# Patient Record
Sex: Female | Born: 1980 | Race: Black or African American | Hispanic: No | Marital: Single | Smoking: Never smoker
Health system: Southern US, Community
[De-identification: ages and names within clinical notes are randomized; demographics above are authoritative.]

## PROBLEM LIST (undated history)

## (undated) DIAGNOSIS — R109 Unspecified abdominal pain: Secondary | ICD-10-CM

## (undated) DIAGNOSIS — M549 Dorsalgia, unspecified: Secondary | ICD-10-CM

## (undated) DIAGNOSIS — E282 Polycystic ovarian syndrome: Secondary | ICD-10-CM

## (undated) DIAGNOSIS — K859 Acute pancreatitis without necrosis or infection, unspecified: Secondary | ICD-10-CM

## (undated) DIAGNOSIS — H35413 Lattice degeneration of retina, bilateral: Secondary | ICD-10-CM

## (undated) DIAGNOSIS — M2550 Pain in unspecified joint: Secondary | ICD-10-CM

## (undated) DIAGNOSIS — R002 Palpitations: Secondary | ICD-10-CM

## (undated) DIAGNOSIS — K219 Gastro-esophageal reflux disease without esophagitis: Secondary | ICD-10-CM

## (undated) DIAGNOSIS — I10 Essential (primary) hypertension: Secondary | ICD-10-CM

## (undated) DIAGNOSIS — E559 Vitamin D deficiency, unspecified: Secondary | ICD-10-CM

## (undated) DIAGNOSIS — M7661 Achilles tendinitis, right leg: Secondary | ICD-10-CM

## (undated) DIAGNOSIS — R0981 Nasal congestion: Secondary | ICD-10-CM

## (undated) DIAGNOSIS — M7662 Achilles tendinitis, left leg: Secondary | ICD-10-CM

## (undated) DIAGNOSIS — G4730 Sleep apnea, unspecified: Secondary | ICD-10-CM

## (undated) DIAGNOSIS — K5900 Constipation, unspecified: Secondary | ICD-10-CM

## (undated) DIAGNOSIS — R51 Headache: Secondary | ICD-10-CM

## (undated) DIAGNOSIS — J40 Bronchitis, not specified as acute or chronic: Secondary | ICD-10-CM

## (undated) DIAGNOSIS — R079 Chest pain, unspecified: Secondary | ICD-10-CM

## (undated) DIAGNOSIS — K8020 Calculus of gallbladder without cholecystitis without obstruction: Secondary | ICD-10-CM

## (undated) DIAGNOSIS — E119 Type 2 diabetes mellitus without complications: Secondary | ICD-10-CM

## (undated) DIAGNOSIS — E1110 Type 2 diabetes mellitus with ketoacidosis without coma: Secondary | ICD-10-CM

## (undated) DIAGNOSIS — R519 Headache, unspecified: Secondary | ICD-10-CM

## (undated) HISTORY — DX: Pain in unspecified joint: M25.50

## (undated) HISTORY — DX: Type 2 diabetes mellitus without complications: E11.9

## (undated) HISTORY — DX: Gastro-esophageal reflux disease without esophagitis: K21.9

## (undated) HISTORY — DX: Constipation, unspecified: K59.00

## (undated) HISTORY — DX: Chest pain, unspecified: R07.9

## (undated) HISTORY — DX: Calculus of gallbladder without cholecystitis without obstruction: K80.20

## (undated) HISTORY — DX: Headache: R51

## (undated) HISTORY — DX: Acute pancreatitis without necrosis or infection, unspecified: K85.90

## (undated) HISTORY — DX: Palpitations: R00.2

## (undated) HISTORY — DX: Headache, unspecified: R51.9

## (undated) HISTORY — DX: Vitamin D deficiency, unspecified: E55.9

## (undated) HISTORY — DX: Unspecified abdominal pain: R10.9

## (undated) HISTORY — PX: TONSILLECTOMY: SUR1361

## (undated) HISTORY — DX: Dorsalgia, unspecified: M54.9

---

## 1998-01-22 ENCOUNTER — Emergency Department (HOSPITAL_COMMUNITY): Admission: EM | Admit: 1998-01-22 | Discharge: 1998-01-22 | Payer: Self-pay | Admitting: Emergency Medicine

## 1999-08-04 ENCOUNTER — Emergency Department (HOSPITAL_COMMUNITY): Admission: EM | Admit: 1999-08-04 | Discharge: 1999-08-04 | Payer: Self-pay | Admitting: Emergency Medicine

## 2002-01-27 ENCOUNTER — Other Ambulatory Visit: Admission: RE | Admit: 2002-01-27 | Discharge: 2002-01-27 | Payer: Self-pay | Admitting: Obstetrics & Gynecology

## 2003-05-08 ENCOUNTER — Emergency Department (HOSPITAL_COMMUNITY): Admission: EM | Admit: 2003-05-08 | Discharge: 2003-05-08 | Payer: Self-pay | Admitting: Emergency Medicine

## 2003-05-08 ENCOUNTER — Encounter: Payer: Self-pay | Admitting: Emergency Medicine

## 2006-10-23 ENCOUNTER — Emergency Department (HOSPITAL_COMMUNITY): Admission: EM | Admit: 2006-10-23 | Discharge: 2006-10-23 | Payer: Self-pay | Admitting: Family Medicine

## 2012-08-09 DIAGNOSIS — E559 Vitamin D deficiency, unspecified: Secondary | ICD-10-CM | POA: Insufficient documentation

## 2012-08-09 DIAGNOSIS — M545 Low back pain, unspecified: Secondary | ICD-10-CM | POA: Insufficient documentation

## 2013-03-27 ENCOUNTER — Emergency Department (HOSPITAL_BASED_OUTPATIENT_CLINIC_OR_DEPARTMENT_OTHER): Payer: BC Managed Care – PPO

## 2013-03-27 ENCOUNTER — Emergency Department (HOSPITAL_BASED_OUTPATIENT_CLINIC_OR_DEPARTMENT_OTHER)
Admission: EM | Admit: 2013-03-27 | Discharge: 2013-03-27 | Disposition: A | Discharge status: Home / Self Care | Payer: BC Managed Care – PPO | Attending: Emergency Medicine | Admitting: Emergency Medicine

## 2013-03-27 ENCOUNTER — Encounter (HOSPITAL_BASED_OUTPATIENT_CLINIC_OR_DEPARTMENT_OTHER): Payer: Self-pay | Admitting: *Deleted

## 2013-03-27 DIAGNOSIS — I10 Essential (primary) hypertension: Secondary | ICD-10-CM | POA: Insufficient documentation

## 2013-03-27 DIAGNOSIS — Z79899 Other long term (current) drug therapy: Secondary | ICD-10-CM | POA: Insufficient documentation

## 2013-03-27 DIAGNOSIS — H53149 Visual discomfort, unspecified: Secondary | ICD-10-CM | POA: Insufficient documentation

## 2013-03-27 DIAGNOSIS — H538 Other visual disturbances: Secondary | ICD-10-CM | POA: Insufficient documentation

## 2013-03-27 DIAGNOSIS — R51 Headache: Secondary | ICD-10-CM | POA: Insufficient documentation

## 2013-03-27 DIAGNOSIS — R519 Headache, unspecified: Secondary | ICD-10-CM

## 2013-03-27 DIAGNOSIS — R42 Dizziness and giddiness: Secondary | ICD-10-CM | POA: Insufficient documentation

## 2013-03-27 HISTORY — DX: Essential (primary) hypertension: I10

## 2013-03-27 LAB — COMPREHENSIVE METABOLIC PANEL
AST: 21 U/L (ref 0–37)
BUN: 11 mg/dL (ref 6–23)
CO2: 25 mEq/L (ref 19–32)
Calcium: 10 mg/dL (ref 8.4–10.5)
Creatinine, Ser: 0.8 mg/dL (ref 0.50–1.10)
GFR calc non Af Amer: >90 mL/min (ref >90)
Total Bilirubin: 0.3 mg/dL (ref 0.3–1.2)

## 2013-03-27 LAB — CBC WITH DIFFERENTIAL/PLATELET
Basophils Absolute: 0 10*3/uL (ref 0.0–0.1)
Basophils Relative: 0 % (ref 0–1)
Eosinophils Relative: 1 % (ref 0–5)
HCT: 36.5 % (ref 36.0–46.0)
Hemoglobin: 12.1 g/dL (ref 12.0–15.0)
Lymphocytes Relative: 29 % (ref 12–46)
MCHC: 33.2 g/dL (ref 30.0–36.0)
MCV: 85.3 fL (ref 78.0–100.0)
Monocytes Absolute: 0.4 10*3/uL (ref 0.1–1.0)
Monocytes Relative: 5 % (ref 3–12)
RDW: 13.5 % (ref 11.5–15.5)

## 2013-03-27 LAB — TROPONIN I: Troponin I: <0.3 ng/mL (ref <0.30)

## 2013-03-27 MED ORDER — DEXAMETHASONE SODIUM PHOSPHATE 10 MG/ML IJ SOLN
10.0000 mg | Freq: Once | INTRAMUSCULAR | Status: AC
  Administered 2013-03-27: 10 mg via INTRAVENOUS
  Filled 2013-03-27: qty 1

## 2013-03-27 MED ORDER — METOCLOPRAMIDE HCL 5 MG/ML IJ SOLN
10.0000 mg | Freq: Once | INTRAMUSCULAR | Status: AC
  Administered 2013-03-27: 10 mg via INTRAVENOUS
  Filled 2013-03-27: qty 2

## 2013-03-27 MED ORDER — KETOROLAC TROMETHAMINE 30 MG/ML IJ SOLN
30.0000 mg | Freq: Once | INTRAMUSCULAR | Status: AC
  Administered 2013-03-27: 30 mg via INTRAVENOUS
  Filled 2013-03-27: qty 1

## 2013-03-27 MED ORDER — SODIUM CHLORIDE 0.9 % IV BOLUS (SEPSIS)
1000.0000 mL | Freq: Once | INTRAVENOUS | Status: AC
  Administered 2013-03-27: 1000 mL via INTRAVENOUS

## 2013-03-27 NOTE — ED Notes (Signed)
Pt describes head and neck pain as well as blurred vision off and on for a few days. Blurred vision worse past few hours. PERRL. Denies other s/s.

## 2013-03-27 NOTE — ED Provider Notes (Signed)
History     CSN: [id number]  Arrival date & time 03/27/13  1338   First MD Initiated Contact with Patient 03/27/13 1400      Chief Complaint  Patient presents with  . Headache    (Consider location/radiation/quality/duration/timing/severity/associated sxs/prior treatment) HPI Comments: Patient presents with complaints of sharp pain behind the right eye for the past few days, worse the past few hours.  She reports dizziness, blurry vision, but no nausea, stiff neck, or fever.  Patient is a 32 y.o. female presenting with headaches. The history is provided by the patient.  Headache Pain location:  R temporal Quality:  Sharp Radiates to:  Does not radiate Chronicity:  New Context: activity and bright light   Relieved by:  Nothing Worsened by:  Activity and light Ineffective treatments:  None tried Associated symptoms: photophobia   Associated symptoms: no dizziness, no fever, no seizures and no sinus pressure     Past Medical History  Diagnosis Date  . Hypertension     Past Surgical History  Procedure Laterality Date  . Tonsillectomy      History reviewed. No pertinent family history.  History  Substance Use Topics  . Smoking status: Never Smoker   . Smokeless tobacco: Not on file  . Alcohol Use: No    OB History   Grav Para Term Preterm Abortions TAB SAB Ect Mult Living                  Review of Systems  Constitutional: Negative for fever.  HENT: Negative for sinus pressure.   Eyes: Positive for photophobia.  Neurological: Positive for headaches. Negative for dizziness and seizures.  All other systems reviewed and are negative.    Allergies  Review of patient's allergies indicates no known allergies.  Home Medications   Current Outpatient Rx  Name  Route  Sig  Dispense  Refill  . lisinopril-hydrochlorothiazide (PRINZIDE,ZESTORETIC) 20-25 MG per tablet   Oral   Take 1 tablet by mouth daily.           BP 188/109  Pulse 76  Temp(Src) 98.3  F (36.8 C) (Oral)  Resp 20  Ht 5\' 11"  (1.803 m)  Wt 396 lb (179.624 kg)  BMI 55.26 kg/m2  SpO2 99%  LMP 03/26/2013  Physical Exam  Nursing note and vitals reviewed. Constitutional: She is oriented to person, place, and time. She appears well-developed and well-nourished. No distress.  HENT:  Head: Normocephalic and atraumatic.  Eyes: EOM are normal. Pupils are equal, round, and reactive to light.  No papilledema on fundoscopic exam.  Neck: Normal range of motion. Neck supple.  Cardiovascular: Normal rate and regular rhythm.  Exam reveals no gallop and no friction rub.   No murmur heard. Pulmonary/Chest: Effort normal and breath sounds normal. No respiratory distress. She has no wheezes.  Abdominal: Soft. Bowel sounds are normal. She exhibits no distension. There is no tenderness.  Musculoskeletal: Normal range of motion.  Neurological: She is alert and oriented to person, place, and time. No cranial nerve deficit. She exhibits normal muscle tone. Coordination normal.  Skin: Skin is warm and dry. She is not diaphoretic.    ED Course  Procedures (including critical care time)  Labs Reviewed - No data to display No results found.   No diagnosis found.   Date: 03/27/2013  Rate: 78  Rhythm: normal sinus rhythm  QRS Axis: normal  Intervals: PR prolonged  ST/T Wave abnormalities: normal  Conduction Disutrbances:first-degree A-V block   Narrative Interpretation:  Old EKG Reviewed: none available    MDM  The patient is feeling better with meds given.  The ct and labs are all unremarkable.  Her initial elevated blood pressure has since resolved and I personally rechecked it to be 150/80.  She will be discharged, to follow up with pcp in the near future.        Geoffery Lyons, MD 03/27/13 [phone]

## 2013-03-31 ENCOUNTER — Ambulatory Visit (HOSPITAL_BASED_OUTPATIENT_CLINIC_OR_DEPARTMENT_OTHER)
Admit: 2013-03-31 | Discharge: 2013-03-31 | Disposition: A | Discharge status: Home / Self Care | Payer: BC Managed Care – PPO | Attending: Emergency Medicine | Admitting: Emergency Medicine

## 2013-03-31 ENCOUNTER — Encounter (HOSPITAL_BASED_OUTPATIENT_CLINIC_OR_DEPARTMENT_OTHER): Payer: Self-pay | Admitting: *Deleted

## 2013-03-31 ENCOUNTER — Emergency Department (HOSPITAL_BASED_OUTPATIENT_CLINIC_OR_DEPARTMENT_OTHER)
Admission: EM | Admit: 2013-03-31 | Discharge: 2013-03-31 | Disposition: A | Discharge status: Home / Self Care | Payer: BC Managed Care – PPO | Attending: Emergency Medicine | Admitting: Emergency Medicine

## 2013-03-31 ENCOUNTER — Emergency Department (HOSPITAL_BASED_OUTPATIENT_CLINIC_OR_DEPARTMENT_OTHER): Payer: BC Managed Care – PPO

## 2013-03-31 DIAGNOSIS — R1013 Epigastric pain: Secondary | ICD-10-CM | POA: Insufficient documentation

## 2013-03-31 DIAGNOSIS — Z3202 Encounter for pregnancy test, result negative: Secondary | ICD-10-CM | POA: Insufficient documentation

## 2013-03-31 DIAGNOSIS — I10 Essential (primary) hypertension: Secondary | ICD-10-CM | POA: Insufficient documentation

## 2013-03-31 DIAGNOSIS — R7402 Elevation of levels of lactic acid dehydrogenase (LDH): Secondary | ICD-10-CM | POA: Insufficient documentation

## 2013-03-31 DIAGNOSIS — K8050 Calculus of bile duct without cholangitis or cholecystitis without obstruction: Secondary | ICD-10-CM

## 2013-03-31 DIAGNOSIS — K8020 Calculus of gallbladder without cholecystitis without obstruction: Secondary | ICD-10-CM | POA: Insufficient documentation

## 2013-03-31 DIAGNOSIS — N281 Cyst of kidney, acquired: Secondary | ICD-10-CM | POA: Insufficient documentation

## 2013-03-31 DIAGNOSIS — R52 Pain, unspecified: Secondary | ICD-10-CM

## 2013-03-31 DIAGNOSIS — Z79899 Other long term (current) drug therapy: Secondary | ICD-10-CM | POA: Insufficient documentation

## 2013-03-31 DIAGNOSIS — R142 Eructation: Secondary | ICD-10-CM | POA: Insufficient documentation

## 2013-03-31 DIAGNOSIS — R141 Gas pain: Secondary | ICD-10-CM | POA: Insufficient documentation

## 2013-03-31 DIAGNOSIS — R7401 Elevation of levels of liver transaminase levels: Secondary | ICD-10-CM | POA: Insufficient documentation

## 2013-03-31 LAB — CBC WITH DIFFERENTIAL/PLATELET
Basophils Absolute: 0 10*3/uL (ref 0.0–0.1)
Eosinophils Relative: 1 % (ref 0–5)
Lymphocytes Relative: 23 % (ref 12–46)
Lymphs Abs: 1.7 10*3/uL (ref 0.7–4.0)
MCV: 84.9 fL (ref 78.0–100.0)
Neutro Abs: 5.2 10*3/uL (ref 1.7–7.7)
Neutrophils Relative %: 70 % (ref 43–77)
Platelets: 293 10*3/uL (ref 150–400)
RBC: 4.64 MIL/uL (ref 3.87–5.11)
RDW: 13.7 % (ref 11.5–15.5)
WBC: 7.5 10*3/uL (ref 4.0–10.5)

## 2013-03-31 LAB — COMPREHENSIVE METABOLIC PANEL
ALT: 651 U/L — ABNORMAL HIGH (ref 0–35)
AST: 462 U/L — ABNORMAL HIGH (ref 0–37)
Alkaline Phosphatase: 153 U/L — ABNORMAL HIGH (ref 39–117)
CO2: 25 mEq/L (ref 19–32)
Calcium: 9.7 mg/dL (ref 8.4–10.5)
Chloride: 99 mEq/L (ref 96–112)
GFR calc Af Amer: >90 mL/min (ref >90)
GFR calc non Af Amer: 83 mL/min — ABNORMAL LOW (ref >90)
Glucose, Bld: 147 mg/dL — ABNORMAL HIGH (ref 70–99)
Potassium: 4.6 mEq/L (ref 3.5–5.1)
Sodium: 135 mEq/L (ref 135–145)

## 2013-03-31 LAB — URINALYSIS, ROUTINE W REFLEX MICROSCOPIC
Glucose, UA: NEGATIVE mg/dL
Ketones, ur: NEGATIVE mg/dL
Leukocytes, UA: NEGATIVE
Nitrite: NEGATIVE
Specific Gravity, Urine: 1.013 (ref 1.005–1.030)
pH: 6 (ref 5.0–8.0)

## 2013-03-31 LAB — LIPASE, BLOOD: Lipase: 85 U/L — ABNORMAL HIGH (ref 11–59)

## 2013-03-31 LAB — URINE MICROSCOPIC-ADD ON

## 2013-03-31 MED ORDER — IOHEXOL 300 MG/ML  SOLN
100.0000 mL | Freq: Once | INTRAMUSCULAR | Status: AC | PRN
  Administered 2013-03-31: 100 mL via INTRAVENOUS

## 2013-03-31 MED ORDER — KETOROLAC TROMETHAMINE 30 MG/ML IJ SOLN
30.0000 mg | Freq: Once | INTRAMUSCULAR | Status: AC
  Administered 2013-03-31: 30 mg via INTRAVENOUS
  Filled 2013-03-31: qty 1

## 2013-03-31 MED ORDER — SODIUM CHLORIDE 0.9 % IV BOLUS (SEPSIS)
1000.0000 mL | Freq: Once | INTRAVENOUS | Status: AC
  Administered 2013-03-31: 1000 mL via INTRAVENOUS

## 2013-03-31 MED ORDER — OXYCODONE-ACETAMINOPHEN 5-325 MG PO TABS: 1.0000 | ORAL_TABLET | Freq: Four times a day (QID) | ORAL | Status: DC | PRN

## 2013-03-31 MED ORDER — IOHEXOL 300 MG/ML  SOLN
50.0000 mL | Freq: Once | INTRAMUSCULAR | Status: AC | PRN
  Administered 2013-03-31: 50 mL via ORAL

## 2013-03-31 MED ORDER — ONDANSETRON 8 MG PO TBDP: ORAL_TABLET | ORAL | Status: DC

## 2013-03-31 MED ORDER — GI COCKTAIL ~~LOC~~
30.0000 mL | Freq: Once | ORAL | Status: AC
  Administered 2013-03-31: 30 mL via ORAL
  Filled 2013-03-31: qty 30

## 2013-03-31 NOTE — ED Provider Notes (Addendum)
History     CSN: [id number]  Arrival date & time 03/31/13  0220   First MD Initiated Contact with Patient 03/31/13 [phone]      Chief Complaint  Patient presents with  . Chest Pain    (Consider location/radiation/quality/duration/timing/severity/associated sxs/prior treatment) Patient is a 32 y.o. female presenting with abdominal pain. The history is provided by the patient. No language interpreter was used.  Abdominal Pain This is a new problem. The current episode started 12 to 24 hours ago. The problem occurs constantly. The problem has been gradually worsening. Associated symptoms include abdominal pain. Pertinent negatives include no headaches and no shortness of breath. Nothing aggravates the symptoms. Nothing relieves the symptoms. She has tried nothing for the symptoms. The treatment provided no relief.    Past Medical History  Diagnosis Date  . Hypertension     Past Surgical History  Procedure Laterality Date  . Tonsillectomy      No family history on file.  History  Substance Use Topics  . Smoking status: Never Smoker   . Smokeless tobacco: Not on file  . Alcohol Use: No    OB History   Grav Para Term Preterm Abortions TAB SAB Ect Mult Living                  Review of Systems  Respiratory: Negative for shortness of breath.   Gastrointestinal: Positive for abdominal pain.  Neurological: Negative for headaches.  All other systems reviewed and are negative.    Allergies  Review of patient's allergies indicates no known allergies.  Home Medications   Current Outpatient Rx  Name  Route  Sig  Dispense  Refill  . lisinopril-hydrochlorothiazide (PRINZIDE,ZESTORETIC) 20-25 MG per tablet   Oral   Take 1 tablet by mouth daily.           BP 158/92  Pulse 80  Temp(Src) 98.3 F (36.8 C) (Oral)  Resp 20  Ht 5\' 11"  (1.803 m)  Wt 396 lb (179.624 kg)  BMI 55.26 kg/m2  SpO2 100%  LMP 03/26/2013  Physical Exam  Constitutional: She is oriented to  person, place, and time. She appears well-developed and well-nourished. No distress.  HENT:  Head: Normocephalic and atraumatic.  Mouth/Throat: Oropharynx is clear and moist.  Eyes: Conjunctivae are normal. Pupils are equal, round, and reactive to light.  Neck: Normal range of motion. Neck supple.  Cardiovascular: Normal rate, regular rhythm and intact distal pulses.   Pulmonary/Chest: Effort normal and breath sounds normal. She has no wheezes. She has no rales.  Abdominal: Soft. Bowel sounds are normal. She exhibits no distension and no mass. There is tenderness in the epigastric area. There is no rebound and no guarding.  Musculoskeletal: Normal range of motion.  Neurological: She is alert and oriented to person, place, and time.  Skin: Skin is warm and dry.  Psychiatric: She has a normal mood and affect.    ED Course  Procedures (including critical care time)  Labs Reviewed  COMPREHENSIVE METABOLIC PANEL - Abnormal; Notable for the following:    Glucose, Bld 147 (*)    AST 462 (*)    ALT 651 (*)    Alkaline Phosphatase 153 (*)    Total Bilirubin 3.1 (*)    GFR calc non Af Amer 83 (*)    All other components within normal limits  LIPASE, BLOOD - Abnormal; Notable for the following:    Lipase 85 (*)    All other components within normal limits  URINALYSIS,  ROUTINE W REFLEX MICROSCOPIC - Abnormal; Notable for the following:    Color, Urine AMBER (*)    Hgb urine dipstick MODERATE (*)    Bilirubin Urine SMALL (*)    All other components within normal limits  CBC WITH DIFFERENTIAL  TROPONIN I  PREGNANCY, URINE  URINE MICROSCOPIC-ADD ON   Dg Chest 2 View  03/31/2013   *RADIOLOGY REPORT*  Clinical Data: Chest pain.  Difficulty breathing.  CHEST - 2 VIEW  Comparison: No priors.  Findings: Lung volumes are normal.  No consolidative airspace disease.  No pleural effusions.  No pneumothorax.  No pulmonary nodule or mass noted.  Pulmonary vasculature and the cardiomediastinal  silhouette are within normal limits.  IMPRESSION: 1. No radiographic evidence of acute cardiopulmonary disease.   Original Report Authenticated By: Trudie Reed, M.D.     No diagnosis found.    MDM   Date: 03/31/2013  Rate: 74  Rhythm: normal sinus rhythm  QRS Axis: normal  Intervals: PR prolonged  ST/T Wave abnormalities: nonspecific ST changes  Conduction Disutrbances:first-degree A-V block   Narrative Interpretation:   Old EKG Reviewed: unchanged    MDM Reviewed: previous chart, nursing note and vitals Reviewed previous: labs and ECG Interpretation: labs, ECG, x-ray and CT scan     Marked change in LFTs, went from normal on 6/15 to being elevated with slight elevation of lipase suspect biliary colic  CT normal, will call surgery regarding close outpatient follow up  Will prescribe pain medication anti emetics and bland diet.  Will need labs rechecked in 3 days   Case d/w Dr. Luisa Hart will need [place] and hepatitis work up and GI follow up.  Instructions to PMD reflect need for repeat LFTS and hepatitis work up.  Patient verbalizes understanding and agrees to follow up  Randal Goens Smitty Cords, MD 03/31/13 [phone]

## 2013-03-31 NOTE — ED Notes (Signed)
Pt sts she was constipated two days ago and took a laxative. She sts the laxative was effective but afterwards she noticed a sharp pain in her lower sternum. Pt sts the pain resolved but then returned apprx. 2 hours ago. Pt sts the pain is worse with palpation and deep breathing.

## 2013-03-31 NOTE — ED Notes (Signed)
Patient transported to CT 

## 2013-04-13 ENCOUNTER — Ambulatory Visit (INDEPENDENT_AMBULATORY_CARE_PROVIDER_SITE_OTHER): Payer: BC Managed Care – PPO | Admitting: General Surgery

## 2013-04-13 ENCOUNTER — Encounter (INDEPENDENT_AMBULATORY_CARE_PROVIDER_SITE_OTHER): Payer: Self-pay | Admitting: General Surgery

## 2013-04-13 ENCOUNTER — Other Ambulatory Visit (INDEPENDENT_AMBULATORY_CARE_PROVIDER_SITE_OTHER): Payer: Self-pay

## 2013-04-13 VITALS — HR 60 | Resp 18 | Ht 71.0 in | Wt 398.4 lb

## 2013-04-13 DIAGNOSIS — K8020 Calculus of gallbladder without cholecystitis without obstruction: Secondary | ICD-10-CM

## 2013-04-13 DIAGNOSIS — I10 Essential (primary) hypertension: Secondary | ICD-10-CM

## 2013-04-13 NOTE — Progress Notes (Signed)
Patient ID: Angel Dawson, female   DOB: 08-Jul-1981, 32 y.o.   MRN: [id number]  No chief complaint on file.   HPI ANTOINE FIALLOS is a 32 y.o. female.  She is referred by Dr. Terressa Koyanagi at the emergency department for evaluation of biliary tract disease.   She has no prior history of biliary tract disease or liver disease or hepatitis. On June 15 she had an episode of abdominal pain. Liver function tests were normal. The pain came back on June 19 was very severe pain in the epigastrium, took or 20 minutes to walk to  her car. She developed nausea and vomiting the next day and went back to the emergency room. This was at [facility]. Liver function tests showed total bilirubin 3.1, lipase 85, AST 462, ALT 681. CT scan showed a cyst of the left kidney but was otherwise negative. Ultrasound showed gallstones versus sludge in the gallbladder but no acute changes. CBD was 5 mm. She became asymptomatic. About 4 or 5 days ago she had a four-hour episode of right upper quadrant pain that resolved she's been asymptomatic since.  Comorbidities include morbid obesity, hypertension. HPI  Past Medical History  Diagnosis Date  . Hypertension   . GERD (gastroesophageal reflux disease)   . Abdominal pain   . Gallstones     Past Surgical History  Procedure Laterality Date  . Tonsillectomy      History reviewed. No pertinent family history.  Social History History  Substance Use Topics  . Smoking status: Never Smoker   . Smokeless tobacco: Not on file  . Alcohol Use: Yes    No Known Allergies  Current Outpatient Prescriptions  Medication Sig Dispense Refill  . carvedilol (COREG) 12.5 MG tablet Take 12.5 mg by mouth 2 (two) times daily with a meal.      . lisinopril-hydrochlorothiazide (PRINZIDE,ZESTORETIC) 20-25 MG per tablet Take 1 tablet by mouth daily.      . ondansetron (ZOFRAN ODT) 8 MG disintegrating tablet 8mg  ODT q8 hours prn nausea  4 tablet  0  .  oxyCODONE-acetaminophen (PERCOCET) 5-325 MG per tablet Take 1 tablet by mouth every 6 (six) hours as needed for pain.  17 tablet  0   No current facility-administered medications for this visit.    Review of Systems Review of Systems  Constitutional: Negative for fever, chills and unexpected weight change.  HENT: Negative for hearing loss, congestion, sore throat, trouble swallowing and voice change.   Eyes: Negative for visual disturbance.  Respiratory: Negative for cough and wheezing.   Cardiovascular: Negative for chest pain, palpitations and leg swelling.  Gastrointestinal: Positive for nausea, vomiting and abdominal pain. Negative for diarrhea, constipation, blood in stool, abdominal distention and anal bleeding.  Genitourinary: Negative for hematuria, vaginal bleeding and difficulty urinating.  Musculoskeletal: Negative for arthralgias.  Skin: Negative for rash and wound.  Neurological: Negative for seizures, syncope and headaches.  Hematological: Negative for adenopathy. Does not bruise/bleed easily.  Psychiatric/Behavioral: Negative for confusion.    Pulse 60, resp. rate 18, height 5\' 11"  (1.803 m), weight 398 lb 6.4 oz (180.713 kg), last menstrual period 03/26/2013.  Physical Exam Physical Exam  Constitutional: She is oriented to person, place, and time. She appears well-developed and well-nourished. No distress.  BMI 55  HENT:  Head: Normocephalic and atraumatic.  Nose: Nose normal.  Mouth/Throat: No oropharyngeal exudate.  Eyes: Conjunctivae and EOM are normal. Pupils are equal, round, and reactive to light. Left eye exhibits no discharge. No  scleral icterus.  Neck: Neck supple. No JVD present. No tracheal deviation present. No thyromegaly present.  Cardiovascular: Normal rate, regular rhythm, normal heart sounds and intact distal pulses.   No murmur heard. Pulmonary/Chest: Effort normal and breath sounds normal. No respiratory distress. She has no wheezes. She has no  rales. She exhibits no tenderness.  Abdominal: Soft. Bowel sounds are normal. She exhibits no distension and no mass. There is no tenderness. There is no rebound and no guarding.  Musculoskeletal: She exhibits no edema and no tenderness.  Lymphadenopathy:    She has no cervical adenopathy.  Neurological: She is alert and oriented to person, place, and time. She exhibits normal muscle tone. Coordination normal.  Skin: Skin is warm. No rash noted. She is not diaphoretic. No erythema. No pallor.  Psychiatric: She has a normal mood and affect. Her behavior is normal. Judgment and thought content normal.    Data Reviewed The emergency department records, imaging studies, lab work.  Assessment    Abdominal pain, abnormal LFTs, and probable gallstones. I suspect that she will need to undergo cholecystectomy  Significant elevation of LFTs, she probably passed a CBD stone. Whether she has a retained stone is not known at this time  Hypertension  Morbid obesity     Plan    I told her that she would ultimately need to have a laparoscopic cholecystectomy with cholangiogram  Prior to the cholecystectomy were going to repeat her LFTs, get an acute hepatitis panel and do an MRCP. If the MRCP is positive for CBD stone we'll consider ERCP preop. If not we will proceed with cholecystectomy and cholangiogram and take it from there.  She will return to see me next week for discussion of her lab and x-ray findings and final planning.  I discussed the indications, details, techniques, and numerous risks of cholecystectomy with her. She understands all these issues and all her questions were answered. She agrees with this plan.          [name]. Derrell Lolling, M.D., [facility] General and Minimally invasive Surgery Breast and Colorectal Surgery Office:   [phone] Pager:   [phone]  04/13/2013, 5:48 PM

## 2013-04-13 NOTE — Patient Instructions (Signed)
I suspect that your episodes of abdominal pain and abnormal liver function tests are due to gallstones. There is a concern about whether you have common bile duct stones that could cause jaundice or pancreatitis in the future.  You will be sent for lab work, hepatitis testing, and a special x-ray: MRCP to evaluate for possible common bile duct stones.  Return to see Dr. Derrell Lolling next week for final discussion. I suspect that you will need to have a gallbladder operation.       Laparoscopic Cholecystectomy Laparoscopic cholecystectomy is surgery to remove the gallbladder. The gallbladder is located slightly to the right of center in the abdomen, behind the liver. It is a concentrating and storage sac for the bile produced in the liver. Bile aids in the digestion and absorption of fats. Gallbladder disease (cholecystitis) is an inflammation of your gallbladder. This condition is usually caused by a buildup of gallstones (cholelithiasis) in your gallbladder. Gallstones can block the flow of bile, resulting in inflammation and pain. In severe cases, emergency surgery may be required. When emergency surgery is not required, you will have time to prepare for the procedure. Laparoscopic surgery is an alternative to open surgery. Laparoscopic surgery usually has a shorter recovery time. Your common bile duct may also need to be examined and explored. Your caregiver will discuss this with you if he or she feels this should be done. If stones are found in the common bile duct, they may be removed. LET YOUR CAREGIVER KNOW ABOUT:  Allergies to food or medicine.  Medicines taken, including vitamins, herbs, eyedrops, over-the-counter medicines, and creams.  Use of steroids (by mouth or creams).  Previous problems with anesthetics or numbing medicines.  History of bleeding problems or blood clots.  Previous surgery.  Other health problems, including diabetes and kidney problems.  Possibility of  pregnancy, if this applies. RISKS AND COMPLICATIONS All surgery is associated with risks. Some problems that may occur following this procedure include:  Infection.  Damage to the common bile duct, nerves, arteries, veins, or other internal organs such as the stomach or intestines.  Bleeding.  A stone may remain in the common bile duct. BEFORE THE PROCEDURE  Do not take aspirin for 3 days prior to surgery or blood thinners for 1 week prior to surgery.  Do not eat or drink anything after midnight the night before surgery.  Let your caregiver know if you develop a cold or other infectious problem prior to surgery.  You should be present 60 minutes before the procedure or as directed. PROCEDURE  You will be given medicine that makes you sleep (general anesthetic). When you are asleep, your surgeon will make several small cuts (incisions) in your abdomen. One of these incisions is used to insert a small, lighted scope (laparoscope) into the abdomen. The laparoscope helps the surgeon see into your abdomen. Carbon dioxide gas will be pumped into your abdomen. The gas allows more room for the surgeon to perform your surgery. Other operating instruments are inserted through the other incisions. Laparoscopic procedures may not be appropriate when:  There is major scarring from previous surgery.  The gallbladder is extremely inflamed.  There are bleeding disorders or unexpected cirrhosis of the liver.  A pregnancy is near term.  Other conditions make the laparoscopic procedure impossible. If your surgeon feels it is not safe to continue with a laparoscopic procedure, he or she will perform an open abdominal procedure. In this case, the surgeon will make an incision to open  the abdomen. This gives the surgeon a larger view and field to work within. This may allow the surgeon to perform procedures that sometimes cannot be performed with a laparoscope alone. Open surgery has a longer recovery  time. AFTER THE PROCEDURE  You will be taken to the recovery area where a nurse will watch and check your progress.  You may be allowed to go home the same day.  Do not resume physical activities until directed by your caregiver.  You may resume a normal diet and activities as directed. Document Released: 09/29/2005 Document Revised: 12/22/2011 Document Reviewed: 03/14/2011 [facility] Patient Information 2014 [place], [place].

## 2013-04-14 ENCOUNTER — Telehealth (INDEPENDENT_AMBULATORY_CARE_PROVIDER_SITE_OTHER): Payer: Self-pay | Admitting: General Surgery

## 2013-04-14 LAB — COMPREHENSIVE METABOLIC PANEL
ALT: 267 U/L — ABNORMAL HIGH (ref 0–35)
AST: 66 U/L — ABNORMAL HIGH (ref 0–37)
Alkaline Phosphatase: 156 U/L — ABNORMAL HIGH (ref 39–117)
BUN: 13 mg/dL (ref 6–23)
Creat: 1.1 mg/dL (ref 0.50–1.10)
Potassium: 3.9 mEq/L (ref 3.5–5.3)

## 2013-04-14 LAB — HEPATITIS PANEL, ACUTE
Hep B C IgM: NEGATIVE
Hepatitis B Surface Ag: NEGATIVE

## 2013-04-14 NOTE — Telephone Encounter (Signed)
Left a message on patient voice mail she has appt at [address] office 04/18/13 at (8:00) pm  NPO after 430pm

## 2013-04-18 ENCOUNTER — Ambulatory Visit
Admission: RE | Admit: 2013-04-18 | Discharge: 2013-04-18 | Disposition: A | Discharge status: Home / Self Care | Payer: BC Managed Care – PPO | Source: Ambulatory Visit | Attending: General Surgery | Admitting: General Surgery

## 2013-04-18 DIAGNOSIS — K8020 Calculus of gallbladder without cholecystitis without obstruction: Secondary | ICD-10-CM

## 2013-04-18 MED ORDER — GADOBENATE DIMEGLUMINE 529 MG/ML IV SOLN
20.0000 mL | Freq: Once | INTRAVENOUS | Status: AC | PRN
  Administered 2013-04-18: 20 mL via INTRAVENOUS

## 2013-04-20 ENCOUNTER — Telehealth (INDEPENDENT_AMBULATORY_CARE_PROVIDER_SITE_OTHER): Payer: Self-pay

## 2013-04-20 NOTE — Telephone Encounter (Signed)
I left a message for the patient with the results.  I offered her an appointment for 05/05/2013 at 0845 to discuss surgery.  I asked her to call me back and confirm.

## 2013-04-20 NOTE — Telephone Encounter (Signed)
Message copied by Ivory Broad on Wed Apr 20, 2013  2:00 PM ------      Message from: Ernestene Mention      Created: Tue Apr 19, 2013 10:42 AM       Please call patient and informed her that the MRCP shows gallstones, but no stones in the main bile duct. We will discuss with her in the office next time, but the next step in her care is probably to proceed with cholecystectomy.            hmi            ----- Message -----         From: Rad Results In Interface         Sent: 04/19/2013   9:36 AM           To: Ernestene Mention, MD                   ------

## 2013-04-21 NOTE — Telephone Encounter (Signed)
I left a message for the pt to call me.

## 2013-04-21 NOTE — Telephone Encounter (Signed)
Made her aware of below message. She wants to delay her appt because she is getting new insurance in August. I made appt for 06/02/2013 but would like sooner if available in August. I made her aware he is not available the first week in August.

## 2013-05-02 ENCOUNTER — Telehealth (INDEPENDENT_AMBULATORY_CARE_PROVIDER_SITE_OTHER): Payer: Self-pay

## 2013-05-02 NOTE — Telephone Encounter (Signed)
Pt calling asking for an appointment sooner than her scheduled Aug 21st appt.  Pt states that she is having more frequent GB attacks.   I did not see anything available before then.  Told pt I would forward Huntley Dec a message to see if she can work pt in. Pt hoping to have Sx sooner rather than later.  Please call back and advise.  [phone]

## 2013-05-05 ENCOUNTER — Encounter (INDEPENDENT_AMBULATORY_CARE_PROVIDER_SITE_OTHER): Payer: BC Managed Care – PPO | Admitting: General Surgery

## 2013-05-20 ENCOUNTER — Telehealth (INDEPENDENT_AMBULATORY_CARE_PROVIDER_SITE_OTHER): Payer: Self-pay

## 2013-05-20 NOTE — Telephone Encounter (Signed)
Patient states she is wanting to be prepared for any gallbladder attacks before she has surgery 06/02/13, she is asking for percocet. I advised her she is not scheduled for surgery on 06/02/13 she is scheduled for consult about having surgery with  Dr. Derrell Lolling on 06/02/13 . If she has G.B attacks she would need to go to the ER , We can not call percocet in . Patient verbalized understanding

## 2013-06-02 ENCOUNTER — Ambulatory Visit (INDEPENDENT_AMBULATORY_CARE_PROVIDER_SITE_OTHER): Payer: BC Managed Care – PPO | Admitting: General Surgery

## 2013-06-02 ENCOUNTER — Encounter (INDEPENDENT_AMBULATORY_CARE_PROVIDER_SITE_OTHER): Payer: Self-pay | Admitting: General Surgery

## 2013-06-02 VITALS — BP 120/90 | HR 78 | Temp 96.4°F | Ht 71.0 in | Wt 396.6 lb

## 2013-06-02 DIAGNOSIS — K8020 Calculus of gallbladder without cholecystitis without obstruction: Secondary | ICD-10-CM

## 2013-06-02 NOTE — Progress Notes (Signed)
Patient ID: Angel Dawson, female   DOB: 1981-09-02, 32 y.o.   MRN: [id number]  Chief Complaint  Patient presents with  . Follow-up    discuss surgery    HPI Angel Dawson is a 32 y.o. female.  She returns for final decision making regarding her biliary tract disease.  Recall that she was referred by Dr. Terressa Koyanagi at the emergency department for evaluation of biliary tract disease.  She has no prior history of biliary tract disease or liver disease or hepatitis. On June 15 she had an episode of abdominal pain. Liver function tests were normal. The pain came back on June 19 was very severe pain in the epigastrium, took or 20 minutes to walk to her car. She developed nausea and vomiting the next day and went back to the emergency room. This was at [facility]. Liver function tests showed total bilirubin 3.1, lipase 85, AST 462, ALT 681. CT scan showed a cyst of the left kidney but was otherwise negative. Ultrasound showed gallstones versus sludge in the gallbladder but no acute changes. CBD was 5 mm. She became asymptomatic. About 4 or 5 days ago she had a four-hour episode of right upper quadrant pain that resolved she's been asymptomatic since.   MRCP shows small gallstones, normal CBD, no evidence of CBD stones. Hepatitis screening was negative. LFTs were improved with total bilirubin 0.5, alkaline phosphatase 156, AST  66 and ALT 267. She has had one more episode of right upper quadrant pain nausea and vomiting which lasted 3 hours since I last saw her. She feels fine today. I discussed all of her tests with her.  Comorbidities include morbid obesity, hypertension.  HPI  Past Medical History  Diagnosis Date  . Hypertension   . GERD (gastroesophageal reflux disease)   . Abdominal pain   . Gallstones     Past Surgical History  Procedure Laterality Date  . Tonsillectomy      History reviewed. No pertinent family history.  Social History History  Substance Use Topics   . Smoking status: Never Smoker   . Smokeless tobacco: Not on file  . Alcohol Use: Yes    No Known Allergies  Current Outpatient Prescriptions  Medication Sig Dispense Refill  . carvedilol (COREG) 12.5 MG tablet Take 12.5 mg by mouth 2 (two) times daily with a meal.      . lisinopril-hydrochlorothiazide (PRINZIDE,ZESTORETIC) 20-25 MG per tablet Take 1 tablet by mouth daily.      . ondansetron (ZOFRAN ODT) 8 MG disintegrating tablet 8mg  ODT q8 hours prn nausea  4 tablet  0  . oxyCODONE-acetaminophen (PERCOCET) 5-325 MG per tablet Take 1 tablet by mouth every 6 (six) hours as needed for pain.  17 tablet  0   No current facility-administered medications for this visit.    Review of Systems Review of Systems  Constitutional: Negative for fever, chills and unexpected weight change.  HENT: Negative for hearing loss, congestion, sore throat, trouble swallowing and voice change.   Eyes: Negative for visual disturbance.  Respiratory: Negative for cough and wheezing.   Cardiovascular: Negative for chest pain, palpitations and leg swelling.  Gastrointestinal: Positive for nausea, vomiting and abdominal pain. Negative for diarrhea, constipation, blood in stool, abdominal distention and anal bleeding.  Genitourinary: Negative for hematuria, vaginal bleeding and difficulty urinating.  Musculoskeletal: Negative for arthralgias.  Skin: Negative for rash and wound.  Neurological: Negative for seizures, syncope and headaches.  Hematological: Negative for adenopathy. Does not bruise/bleed easily.  Psychiatric/Behavioral:  Negative for confusion.    Blood pressure 120/90, pulse 78, temperature 96.4 F (35.8 C), temperature source Temporal, height 5\' 11"  (1.803 m), weight 396 lb 9.6 oz (179.897 kg), SpO2 98.00%.  Physical Exam Physical Exam  Constitutional: She is oriented to person, place, and time. She appears well-developed and well-nourished. No distress.  396 lbs.  BMI 55.  HENT:  Head:  Normocephalic and atraumatic.  Nose: Nose normal.  Mouth/Throat: No oropharyngeal exudate.  Eyes: Conjunctivae and EOM are normal. Pupils are equal, round, and reactive to light. Left eye exhibits no discharge. No scleral icterus.  Neck: Neck supple. No JVD present. No tracheal deviation present. No thyromegaly present.  Cardiovascular: Normal rate, regular rhythm, normal heart sounds and intact distal pulses.   No murmur heard. Pulmonary/Chest: Effort normal and breath sounds normal. No respiratory distress. She has no wheezes. She has no rales. She exhibits no tenderness.  Abdominal: Soft. Bowel sounds are normal. She exhibits no distension and no mass. There is no tenderness. There is no rebound and no guarding.  Musculoskeletal: She exhibits no edema and no tenderness.  Lymphadenopathy:    She has no cervical adenopathy.  Neurological: She is alert and oriented to person, place, and time. She exhibits normal muscle tone. Coordination normal.  Skin: Skin is warm. No rash noted. She is not diaphoretic. No erythema. No pallor.  Psychiatric: She has a normal mood and affect. Her behavior is normal. Judgment and thought content normal.    Data Reviewed MRCP. Hepatitis serology. Chemistries.  Assessment    Chronic cholecystitis with cholelithiasis and recurring episodes of biliary colic.  Transient elevation of LFTs, she probably passed a CBD stone. Retained stone as possible  Hypertension  Morbid obesity     Plan    Proceed with scheduling of laparoscopic cholecystectomy with cholangiogram, possible open. We will do this at [facility] because of the extra long instruments. If everything goes well she can go home with her mother the same day. Otherwise we will keep her overnight.  Once again, I discussed the indications, details, techniques numerous risks of cholecystectomy with her. She understands all these issues and all of her questions were answered and she agrees with this plan.         Angel Dawson. Derrell Lolling, M.D., [facility] General and Minimally invasive Surgery Breast and Colorectal Surgery Office:   [phone] Pager:   [phone]  06/02/2013, 11:16 AM

## 2013-06-02 NOTE — Patient Instructions (Signed)
The MRCP shows gallstones, but the main bile duct looks normal. No evidence of common duct stones.  Your  liver function tests are improved, but not quite normal.  Hepatitis testing is negative.  You will be scheduled for laparoscopic cholecystectomy with cholangiogram, possible open in the near future.    Laparoscopic Cholecystectomy Laparoscopic cholecystectomy is surgery to remove the gallbladder. The gallbladder is located slightly to the right of center in the abdomen, behind the liver. It is a concentrating and storage sac for the bile produced in the liver. Bile aids in the digestion and absorption of fats. Gallbladder disease (cholecystitis) is an inflammation of your gallbladder. This condition is usually caused by a buildup of gallstones (cholelithiasis) in your gallbladder. Gallstones can block the flow of bile, resulting in inflammation and pain. In severe cases, emergency surgery may be required. When emergency surgery is not required, you will have time to prepare for the procedure. Laparoscopic surgery is an alternative to open surgery. Laparoscopic surgery usually has a shorter recovery time. Your common bile duct may also need to be examined and explored. Your caregiver will discuss this with you if he or she feels this should be done. If stones are found in the common bile duct, they may be removed. LET YOUR CAREGIVER KNOW ABOUT:  Allergies to food or medicine.  Medicines taken, including vitamins, herbs, eyedrops, over-the-counter medicines, and creams.  Use of steroids (by mouth or creams).  Previous problems with anesthetics or numbing medicines.  History of bleeding problems or blood clots.  Previous surgery.  Other health problems, including diabetes and kidney problems.  Possibility of pregnancy, if this applies. RISKS AND COMPLICATIONS All surgery is associated with risks. Some problems that may occur following this procedure include:  Infection.  Damage to  the common bile duct, nerves, arteries, veins, or other internal organs such as the stomach or intestines.  Bleeding.  A stone may remain in the common bile duct. BEFORE THE PROCEDURE  Do not take aspirin for 3 days prior to surgery or blood thinners for 1 week prior to surgery.  Do not eat or drink anything after midnight the night before surgery.  Let your caregiver know if you develop a cold or other infectious problem prior to surgery.  You should be present 60 minutes before the procedure or as directed. PROCEDURE  You will be given medicine that makes you sleep (general anesthetic). When you are asleep, your surgeon will make several small cuts (incisions) in your abdomen. One of these incisions is used to insert a small, lighted scope (laparoscope) into the abdomen. The laparoscope helps the surgeon see into your abdomen. Carbon dioxide gas will be pumped into your abdomen. The gas allows more room for the surgeon to perform your surgery. Other operating instruments are inserted through the other incisions. Laparoscopic procedures may not be appropriate when:  There is major scarring from previous surgery.  The gallbladder is extremely inflamed.  There are bleeding disorders or unexpected cirrhosis of the liver.  A pregnancy is near term.  Other conditions make the laparoscopic procedure impossible. If your surgeon feels it is not safe to continue with a laparoscopic procedure, he or she will perform an open abdominal procedure. In this case, the surgeon will make an incision to open the abdomen. This gives the surgeon a larger view and field to work within. This may allow the surgeon to perform procedures that sometimes cannot be performed with a laparoscope alone. Open surgery has a longer  recovery time. AFTER THE PROCEDURE  You will be taken to the recovery area where a nurse will watch and check your progress.  You may be allowed to go home the same day.  Do not resume  physical activities until directed by your caregiver.  You may resume a normal diet and activities as directed. Document Released: 09/29/2005 Document Revised: 12/22/2011 Document Reviewed: 03/14/2011 [facility] Patient Information 2014 [place], [place].

## 2013-06-09 ENCOUNTER — Encounter (HOSPITAL_COMMUNITY): Payer: Self-pay | Admitting: Pharmacy Technician

## 2013-06-10 ENCOUNTER — Telehealth (INDEPENDENT_AMBULATORY_CARE_PROVIDER_SITE_OTHER): Payer: Self-pay | Admitting: *Deleted

## 2013-06-10 NOTE — Telephone Encounter (Signed)
Patient called to ask if there is any way Dr. Derrell Lolling would prescribed a refill of her pain medication.  Patient is schedule for a lap chole on 06/22/13.  Patient states she has had flare ups of her gallbladder the last two nights and having difficulty with pain control.  Patient states she was seen in the ED back in June 2014 at which she was dx with cholecystitis and referred to [place].  Patient states she was prescribed pain medication at that time but has now run out.  Explained to patient that a message would be sent to Dr. Derrell Lolling to ask then we would let her know.  Patient states understanding and agreeable with plan at this time.

## 2013-06-10 NOTE — Telephone Encounter (Signed)
Prescription called into [facility] [phone] for Norco 5/325mg  Take 1 tablet every 4-6 hours as needed for pain #30 no refills per approval by Dr. Derrell Lolling below.  Patient updated at this time.

## 2013-06-10 NOTE — Telephone Encounter (Signed)
OK vicodin # 30.

## 2013-06-15 ENCOUNTER — Encounter (HOSPITAL_COMMUNITY): Payer: Self-pay

## 2013-06-15 ENCOUNTER — Encounter (HOSPITAL_COMMUNITY)
Admission: RE | Admit: 2013-06-15 | Discharge: 2013-06-15 | Disposition: A | Discharge status: Home / Self Care | Payer: BC Managed Care – PPO | Source: Ambulatory Visit | Attending: General Surgery | Admitting: General Surgery

## 2013-06-15 DIAGNOSIS — Z01812 Encounter for preprocedural laboratory examination: Secondary | ICD-10-CM | POA: Insufficient documentation

## 2013-06-15 HISTORY — DX: Nasal congestion: R09.81

## 2013-06-15 HISTORY — DX: Sleep apnea, unspecified: G47.30

## 2013-06-15 LAB — CBC WITH DIFFERENTIAL/PLATELET
Basophils Absolute: 0 10*3/uL (ref 0.0–0.1)
Eosinophils Relative: 1 % (ref 0–5)
Lymphocytes Relative: 32 % (ref 12–46)
MCV: 86.3 fL (ref 78.0–100.0)
Neutro Abs: 5.8 10*3/uL (ref 1.7–7.7)
Platelets: 290 10*3/uL (ref 150–400)
RDW: 13.7 % (ref 11.5–15.5)
WBC: 9.2 10*3/uL (ref 4.0–10.5)

## 2013-06-15 LAB — COMPREHENSIVE METABOLIC PANEL
ALT: 52 U/L — ABNORMAL HIGH (ref 0–35)
AST: 21 U/L (ref 0–37)
Albumin: 3.9 g/dL (ref 3.5–5.2)
CO2: 26 mEq/L (ref 19–32)
Calcium: 9.9 mg/dL (ref 8.4–10.5)
GFR calc non Af Amer: 88 mL/min — ABNORMAL LOW (ref >90)
Sodium: 138 mEq/L (ref 135–145)

## 2013-06-15 LAB — HCG, SERUM, QUALITATIVE: Preg, Serum: NEGATIVE

## 2013-06-15 NOTE — Pre-Procedure Instructions (Addendum)
06-15-13 EKG/ CXR 6'14-Epic. Dr. Okey Dupre in to see for anesthesia consult. Pt. Advised to start back taking Carvedilol at least once daily until surgery per Dr. Okey Dupre. W. Keelan Tripodi,RN 06-16-13 1455 labs viewable in Epic-note to Dr. Jacinto Halim in basket. W. Kennon Portela

## 2013-06-15 NOTE — Patient Instructions (Addendum)
20 Angel Dawson  06/15/2013   Your procedure is scheduled on: 9-10  -2014  Report to [facility] at      0630  AM  Call this number if you have problems the morning of surgery: [phone]  Or Presurgical Testing [phone](Anika Shore)      Do not eat food:After Midnight.    Take these medicines the morning of surgery with A SIP OF WATER: Carvedilol. Famotidine. Hydrocodone.    Do not wear jewelry, make-up or nail polish.  Do not wear lotions, powders, or perfumes. You may wear deodorant.  Do not shave 12 hours prior to first CHG shower(legs and under arms).(face and neck okay.)  Do not bring valuables to the hospital.  Contacts, dentures or bridgework,body piercing,  may not be worn into surgery.  Leave suitcase in the car. After surgery it may be brought to your room.  For patients admitted to the hospital, checkout time is 11:00 AM the day of discharge.   Patients discharged the day of surgery will not be allowed to drive home. Must have responsible person with you x 24 hours once discharged.  Name and phone number of your driver: Brynlee Pennywell, mother [phone] cell  Special Instructions: CHG(Chlorhedine 4%-"Hibiclens","Betasept","Aplicare") Shower Use Special Wash: see special instructions.(avoid face and genitals)      Failure to follow these instructions may result in Cancellation of your surgery.   Patient signature_______________________________________________________

## 2013-06-15 NOTE — Anesthesia Preprocedure Evaluation (Addendum)
Anesthesia Evaluation  Patient identified by MRN, date of birth, ID band Patient awake    Reviewed: Allergy & Precautions, H&P , NPO status , Patient's Chart, lab work & pertinent test results  Airway Mallampati: III TM Distance: <3 FB Neck ROM: Full    Dental no notable dental hx. (+) Teeth Intact and Dental Advisory Given   Pulmonary neg pulmonary ROS, sleep apnea ,  Stop bang 4 breath sounds clear to auscultation  Pulmonary exam normal       Cardiovascular hypertension, Pt. on medications and Pt. on home beta blockers Rhythm:Regular Rate:Normal     Neuro/Psych negative neurological ROS  negative psych ROS   GI/Hepatic negative GI ROS, Neg liver ROS,   Endo/Other  Morbid obesity  Renal/GU negative Renal ROS  negative genitourinary   Musculoskeletal negative musculoskeletal ROS (+)   Abdominal Normal abdominal exam  (+)   Peds negative pediatric ROS (+)  Hematology negative hematology ROS (+)   Anesthesia Other Findings   Reproductive/Obstetrics negative OB ROS                         Anesthesia Physical Anesthesia Plan  ASA: III  Anesthesia Plan: General   Post-op Pain Management:    Induction: Intravenous  Airway Management Planned: Oral ETT  Additional Equipment:   Intra-op Plan:   Post-operative Plan: Extubation in OR  Informed Consent:   Dental advisory given  Plan Discussed with: CRNA and Surgeon  Anesthesia Plan Comments:         Anesthesia Quick Evaluation

## 2013-06-15 NOTE — Progress Notes (Signed)
Your Pt has screened with an elevated risk for obstructive sleep apnea using the Stop-Bang tool during a presurgical  Visit. A score of four or greater is an elevated risk. 

## 2013-06-16 NOTE — Progress Notes (Signed)
06-16-13 1455 Labs viewable in Epic.

## 2013-06-20 NOTE — H&P (Signed)
Angel Dawson   MRN:  [id number]   Description: 32 year old female  Provider: Ernestene Mention, MD  Department: [facility]        Diagnoses    Gallstones    -  Primary    574.20    Severe obesity (BMI >= 40)        278.01      Reason for Visit    Follow-up    discuss surgery        Current Vitals - Last Recorded    BP Pulse Temp(Src) Ht Wt BMI    120/90 78 96.4 F (35.8 C) (Temporal) 5\' 11"  (1.803 m) 396 lb 9.6 oz (179.897 kg) 55.34 kg/m2                   History and Physical    Ernestene Mention, MD     Status: Signed                             HPI Angel Dawson is a 32 y.o. female.  She returns for final decision making regarding her biliary tract disease.   Recall that she was referred by Dr. Terressa Koyanagi at the emergency department for evaluation of biliary tract disease.   She has no prior history of biliary tract disease or liver disease or hepatitis. On June 15 she had an episode of abdominal pain. Liver function tests were normal. The pain came back on June 19 was very severe pain in the epigastrium, took or 20 minutes to walk to her car. She developed nausea and vomiting the next day and went back to the emergency room. This was at [facility]. Liver function tests showed total bilirubin 3.1, lipase 85, AST 462, ALT 681. CT scan showed a cyst of the left kidney but was otherwise negative. Ultrasound showed gallstones versus sludge in the gallbladder but no acute changes. CBD was 5 mm. She became asymptomatic. About 4 or 5 days ago she had a four-hour episode of right upper quadrant pain that resolved she's been asymptomatic since.    MRCP shows small gallstones, normal CBD, no evidence of CBD stones. Hepatitis screening was negative. LFTs were improved with total bilirubin 0.5, alkaline phosphatase 156, AST  66 and ALT 267. She has had one more episode of right upper quadrant pain nausea and vomiting which lasted 3 hours since I last  saw her. She feels fine today. I discussed all of her tests with her.   Comorbidities include morbid obesity, hypertension.        Past Medical History   Diagnosis  Date   .  Hypertension     .  GERD (gastroesophageal reflux disease)     .  Abdominal pain     .  Gallstones           Past Surgical History   Procedure  Laterality  Date   .  Tonsillectomy            History reviewed. No pertinent family history.   Social History History   Substance Use Topics   .  Smoking status:  Never Smoker    .  Smokeless tobacco:  Not on file   .  Alcohol Use:  Yes        No Known Allergies    Current Outpatient Prescriptions   Medication  Sig  Dispense  Refill   .  carvedilol (  COREG) 12.5 MG tablet  Take 12.5 mg by mouth 2 (two) times daily with a meal.         .  lisinopril-hydrochlorothiazide (PRINZIDE,ZESTORETIC) 20-25 MG per tablet  Take 1 tablet by mouth daily.         .  ondansetron (ZOFRAN ODT) 8 MG disintegrating tablet  8mg  ODT q8 hours prn nausea   4 tablet   0   .  oxyCODONE-acetaminophen (PERCOCET) 5-325 MG per tablet  Take 1 tablet by mouth every 6 (six) hours as needed for pain.   17 tablet   0            Review of Systems   Constitutional: Negative for fever, chills and unexpected weight change.  HENT: Negative for hearing loss, congestion, sore throat, trouble swallowing and voice change.   Eyes: Negative for visual disturbance.  Respiratory: Negative for cough and wheezing.   Cardiovascular: Negative for chest pain, palpitations and leg swelling.  Gastrointestinal: Positive for nausea, vomiting and abdominal pain. Negative for diarrhea, constipation, blood in stool, abdominal distention and anal bleeding.  Genitourinary: Negative for hematuria, vaginal bleeding and difficulty urinating.  Musculoskeletal: Negative for arthralgias.  Skin: Negative for rash and wound.  Neurological: Negative for seizures, syncope and headaches.  Hematological:  Negative for adenopathy. Does not bruise/bleed easily.  Psychiatric/Behavioral: Negative for confusion.      Blood pressure 120/90, pulse 78, temperature 96.4 F (35.8 C), temperature source Temporal, height 5\' 11"  (1.803 m), weight 396 lb 9.6 oz (179.897 kg), SpO2 98.00%.   Physical Exam  Constitutional: She is oriented to person, place, and time. She appears well-developed and well-nourished. No distress.  396 lbs.  BMI 55.  HENT:   Head: Normocephalic and atraumatic.   Nose: Nose normal.   Mouth/Throat: No oropharyngeal exudate.  Eyes: Conjunctivae and EOM are normal. Pupils are equal, round, and reactive to light. Left eye exhibits no discharge. No scleral icterus.  Neck: Neck supple. No JVD present. No tracheal deviation present. No thyromegaly present.  Cardiovascular: Normal rate, regular rhythm, normal heart sounds and intact distal pulses.    No murmur heard. Pulmonary/Chest: Effort normal and breath sounds normal. No respiratory distress. She has no wheezes. She has no rales. She exhibits no tenderness.  Abdominal: Soft. Bowel sounds are normal. She exhibits no distension and no mass. There is no tenderness. There is no rebound and no guarding.  Musculoskeletal: She exhibits no edema and no tenderness.  Lymphadenopathy:    She has no cervical adenopathy.  Neurological: She is alert and oriented to person, place, and time. She exhibits normal muscle tone. Coordination normal.  Skin: Skin is warm. No rash noted. She is not diaphoretic. No erythema. No pallor.  Psychiatric: She has a normal mood and affect. Her behavior is normal. Judgment and thought content normal.      Data Reviewed MRCP. Hepatitis serology. Chemistries.   Assessment    Chronic cholecystitis with cholelithiasis and recurring episodes of biliary colic.   Transient elevation of LFTs, she probably passed a CBD stone. Retained stone as possible   Hypertension   Morbid obesity      Plan     Proceed with scheduling of laparoscopic cholecystectomy with cholangiogram, possible open. We will do this at [facility] because of the extra long instruments. If everything goes well she can go home with her mother the same day. Otherwise we will keep her overnight.   Once again, I discussed the indications, details, techniques numerous risks of  cholecystectomy with her. She understands all these issues and all of her questions were answered and she agrees with this plan.           [name]. Derrell Lolling, M.D., [facility] General and Minimally invasive Surgery Breast and Colorectal Surgery Office:   [phone] Pager:   [phone]

## 2013-06-22 ENCOUNTER — Encounter (HOSPITAL_COMMUNITY): Payer: Self-pay | Admitting: Anesthesiology

## 2013-06-22 ENCOUNTER — Encounter (HOSPITAL_COMMUNITY): Payer: Self-pay | Admitting: *Deleted

## 2013-06-22 ENCOUNTER — Ambulatory Visit (HOSPITAL_COMMUNITY): Payer: BC Managed Care – PPO

## 2013-06-22 ENCOUNTER — Ambulatory Visit (HOSPITAL_COMMUNITY): Payer: BC Managed Care – PPO | Admitting: Anesthesiology

## 2013-06-22 ENCOUNTER — Ambulatory Visit (HOSPITAL_COMMUNITY)
Admission: RE | Admit: 2013-06-22 | Discharge: 2013-06-22 | Disposition: A | Discharge status: Home / Self Care | Payer: BC Managed Care – PPO | Source: Ambulatory Visit | Attending: General Surgery | Admitting: General Surgery

## 2013-06-22 ENCOUNTER — Encounter (HOSPITAL_COMMUNITY)
Admission: RE | Disposition: A | Discharge status: Home / Self Care | Payer: Self-pay | Source: Ambulatory Visit | Attending: General Surgery

## 2013-06-22 DIAGNOSIS — K219 Gastro-esophageal reflux disease without esophagitis: Secondary | ICD-10-CM | POA: Insufficient documentation

## 2013-06-22 DIAGNOSIS — K8020 Calculus of gallbladder without cholecystitis without obstruction: Secondary | ICD-10-CM | POA: Diagnosis present

## 2013-06-22 DIAGNOSIS — K8010 Calculus of gallbladder with chronic cholecystitis without obstruction: Secondary | ICD-10-CM

## 2013-06-22 DIAGNOSIS — I10 Essential (primary) hypertension: Secondary | ICD-10-CM | POA: Insufficient documentation

## 2013-06-22 DIAGNOSIS — Z6841 Body Mass Index (BMI) 40.0 and over, adult: Secondary | ICD-10-CM | POA: Insufficient documentation

## 2013-06-22 DIAGNOSIS — R7989 Other specified abnormal findings of blood chemistry: Secondary | ICD-10-CM | POA: Insufficient documentation

## 2013-06-22 DIAGNOSIS — G4730 Sleep apnea, unspecified: Secondary | ICD-10-CM | POA: Insufficient documentation

## 2013-06-22 DIAGNOSIS — Z79899 Other long term (current) drug therapy: Secondary | ICD-10-CM | POA: Insufficient documentation

## 2013-06-22 HISTORY — PX: CHOLECYSTECTOMY: SHX55

## 2013-06-22 SURGERY — LAPAROSCOPIC CHOLECYSTECTOMY WITH INTRAOPERATIVE CHOLANGIOGRAM
Anesthesia: General | Site: Abdomen | Wound class: Contaminated

## 2013-06-22 MED ORDER — BUPIVACAINE-EPINEPHRINE 0.5% -1:200000 IJ SOLN
INTRAMUSCULAR | Status: DC | PRN
  Administered 2013-06-22: 25 mL

## 2013-06-22 MED ORDER — CHLORHEXIDINE GLUCONATE 4 % EX LIQD
1.0000 "application " | Freq: Once | CUTANEOUS | Status: DC
  Filled 2013-06-22: qty 15

## 2013-06-22 MED ORDER — DEXAMETHASONE SODIUM PHOSPHATE 10 MG/ML IJ SOLN
INTRAMUSCULAR | Status: DC | PRN
  Administered 2013-06-22: 10 mg via INTRAVENOUS

## 2013-06-22 MED ORDER — PROPOFOL 10 MG/ML IV BOLUS
INTRAVENOUS | Status: DC | PRN
  Administered 2013-06-22: 200 mg via INTRAVENOUS

## 2013-06-22 MED ORDER — DEXTROSE 5 % IV SOLN
3.0000 g | INTRAVENOUS | Status: AC
  Administered 2013-06-22: 3 g via INTRAVENOUS
  Filled 2013-06-22: qty 3000

## 2013-06-22 MED ORDER — NEOSTIGMINE METHYLSULFATE 1 MG/ML IJ SOLN
INTRAMUSCULAR | Status: DC | PRN
  Administered 2013-06-22: 4 mg via INTRAVENOUS

## 2013-06-22 MED ORDER — HYDROCODONE-ACETAMINOPHEN 5-325 MG PO TABS: 1.0000 | ORAL_TABLET | ORAL | Status: DC | PRN

## 2013-06-22 MED ORDER — ENOXAPARIN SODIUM 40 MG/0.4ML ~~LOC~~ SOLN
40.0000 mg | Freq: Once | SUBCUTANEOUS | Status: AC
  Administered 2013-06-22: 40 mg via SUBCUTANEOUS
  Filled 2013-06-22: qty 0.4

## 2013-06-22 MED ORDER — SUFENTANIL CITRATE 50 MCG/ML IV SOLN
INTRAVENOUS | Status: DC | PRN
  Administered 2013-06-22 (×2): 10 ug via INTRAVENOUS
  Administered 2013-06-22: 20 ug via INTRAVENOUS

## 2013-06-22 MED ORDER — LIDOCAINE HCL (CARDIAC) 20 MG/ML IV SOLN
INTRAVENOUS | Status: DC | PRN
  Administered 2013-06-22: 100 mg via INTRAVENOUS

## 2013-06-22 MED ORDER — KETOROLAC TROMETHAMINE 30 MG/ML IJ SOLN
INTRAMUSCULAR | Status: AC
  Filled 2013-06-22: qty 1

## 2013-06-22 MED ORDER — LACTATED RINGERS IV SOLN: INTRAVENOUS | Status: DC

## 2013-06-22 MED ORDER — SUCCINYLCHOLINE CHLORIDE 20 MG/ML IJ SOLN
INTRAMUSCULAR | Status: DC | PRN
  Administered 2013-06-22: 160 mg via INTRAVENOUS

## 2013-06-22 MED ORDER — LACTATED RINGERS IR SOLN
Status: DC | PRN
  Administered 2013-06-22: 1000 mL

## 2013-06-22 MED ORDER — HYDROMORPHONE HCL PF 1 MG/ML IJ SOLN
INTRAMUSCULAR | Status: AC
  Filled 2013-06-22: qty 2

## 2013-06-22 MED ORDER — CISATRACURIUM BESYLATE (PF) 10 MG/5ML IV SOLN
INTRAVENOUS | Status: DC | PRN
  Administered 2013-06-22: 2 mg via INTRAVENOUS
  Administered 2013-06-22: 4 mg via INTRAVENOUS
  Administered 2013-06-22: 6 mg via INTRAVENOUS

## 2013-06-22 MED ORDER — ONDANSETRON HCL 4 MG/2ML IJ SOLN
INTRAMUSCULAR | Status: DC | PRN
  Administered 2013-06-22: 4 mg via INTRAVENOUS

## 2013-06-22 MED ORDER — BUPIVACAINE-EPINEPHRINE 0.5% -1:200000 IJ SOLN
INTRAMUSCULAR | Status: AC
  Filled 2013-06-22: qty 1

## 2013-06-22 MED ORDER — LACTATED RINGERS IV SOLN
INTRAVENOUS | Status: DC | PRN
  Administered 2013-06-22 (×2): via INTRAVENOUS

## 2013-06-22 MED ORDER — CEFAZOLIN SODIUM-DEXTROSE 2-3 GM-% IV SOLR
INTRAVENOUS | Status: AC
  Filled 2013-06-22: qty 50

## 2013-06-22 MED ORDER — GLYCOPYRROLATE 0.2 MG/ML IJ SOLN
INTRAMUSCULAR | Status: DC | PRN
  Administered 2013-06-22: .6 mg via INTRAVENOUS

## 2013-06-22 MED ORDER — CEFAZOLIN SODIUM 1-5 GM-% IV SOLN
INTRAVENOUS | Status: AC
  Filled 2013-06-22: qty 50

## 2013-06-22 MED ORDER — KETOROLAC TROMETHAMINE 30 MG/ML IJ SOLN
30.0000 mg | Freq: Once | INTRAMUSCULAR | Status: AC
  Administered 2013-06-22: 30 mg via INTRAVENOUS

## 2013-06-22 MED ORDER — 0.9 % SODIUM CHLORIDE (POUR BTL) OPTIME
TOPICAL | Status: DC | PRN
  Administered 2013-06-22: 1000 mL

## 2013-06-22 MED ORDER — MIDAZOLAM HCL 5 MG/5ML IJ SOLN
INTRAMUSCULAR | Status: DC | PRN
  Administered 2013-06-22: 2 mg via INTRAVENOUS

## 2013-06-22 MED ORDER — HYDROMORPHONE HCL PF 1 MG/ML IJ SOLN
0.2500 mg | INTRAMUSCULAR | Status: DC | PRN
  Administered 2013-06-22 (×2): 0.5 mg via INTRAVENOUS

## 2013-06-22 SURGICAL SUPPLY — 38 items
APPLIER CLIP ROT 10 11.4 M/L (STAPLE) ×2
BENZOIN TINCTURE PRP APPL 2/3 (GAUZE/BANDAGES/DRESSINGS) IMPLANT
CANISTER SUCTION 2500CC (MISCELLANEOUS) ×2 IMPLANT
CLIP APPLIE ROT 10 11.4 M/L (STAPLE) ×1 IMPLANT
CLOTH BEACON ORANGE TIMEOUT ST (SAFETY) ×2 IMPLANT
COVER MAYO STAND STRL (DRAPES) ×2 IMPLANT
DECANTER SPIKE VIAL GLASS SM (MISCELLANEOUS) ×2 IMPLANT
DERMABOND ADVANCED (GAUZE/BANDAGES/DRESSINGS) ×1
DERMABOND ADVANCED .7 DNX12 (GAUZE/BANDAGES/DRESSINGS) ×1 IMPLANT
DRAPE C-ARM 42X120 X-RAY (DRAPES) ×2 IMPLANT
DRAPE LAPAROSCOPIC ABDOMINAL (DRAPES) ×2 IMPLANT
ELECT REM PT RETURN 9FT ADLT (ELECTROSURGICAL) ×2
ELECTRODE REM PT RTRN 9FT ADLT (ELECTROSURGICAL) ×1 IMPLANT
GLOVE BIOGEL PI IND STRL 7.0 (GLOVE) ×1 IMPLANT
GLOVE BIOGEL PI INDICATOR 7.0 (GLOVE) ×1
GLOVE EUDERMIC 7 POWDERFREE (GLOVE) ×2 IMPLANT
GOWN STRL NON-REIN LRG LVL3 (GOWN DISPOSABLE) ×4 IMPLANT
GOWN STRL REIN XL XLG (GOWN DISPOSABLE) ×4 IMPLANT
HEMOSTAT SNOW SURGICEL 2X4 (HEMOSTASIS) IMPLANT
IV LACTATED RINGER IRRG 3000ML (IV SOLUTION)
IV LR IRRIG 3000ML ARTHROMATIC (IV SOLUTION) IMPLANT
KIT BASIN OR (CUSTOM PROCEDURE TRAY) ×2 IMPLANT
NEEDLE SPNL 22GX3.5 QUINCKE BK (NEEDLE) ×2 IMPLANT
NS IRRIG 1000ML POUR BTL (IV SOLUTION) ×2 IMPLANT
POUCH SPECIMEN RETRIEVAL 10MM (ENDOMECHANICALS) ×2 IMPLANT
SET CHOLANGIOGRAPH MIX (MISCELLANEOUS) ×2 IMPLANT
SET IRRIG TUBING LAPAROSCOPIC (IRRIGATION / IRRIGATOR) ×2 IMPLANT
SOLUTION ANTI FOG 6CC (MISCELLANEOUS) ×2 IMPLANT
STRIP CLOSURE SKIN 1/2X4 (GAUZE/BANDAGES/DRESSINGS) IMPLANT
SUT MNCRL AB 4-0 PS2 18 (SUTURE) ×2 IMPLANT
TOWEL OR 17X26 10 PK STRL BLUE (TOWEL DISPOSABLE) ×6 IMPLANT
TRAY LAP CHOLE (CUSTOM PROCEDURE TRAY) ×2 IMPLANT
TROCAR 12M 150ML BLUNT (TROCAR) ×4 IMPLANT
TROCAR BLADELESS OPT 5 150 (ENDOMECHANICALS) ×6 IMPLANT
TROCAR BLADELESS OPT 5 75 (ENDOMECHANICALS) ×4 IMPLANT
TROCAR XCEL BLUNT TIP 100MML (ENDOMECHANICALS) ×2 IMPLANT
TROCAR XCEL NON-BLD 11X100MML (ENDOMECHANICALS) ×2 IMPLANT
TUBING INSUFFLATION 10FT LAP (TUBING) ×2 IMPLANT

## 2013-06-22 NOTE — Op Note (Signed)
Patient Name:           Angel Dawson   Date of Surgery:        06/22/2013  Pre op Diagnosis:      Chronic cholecystitis with cholelithiasis  Post op Diagnosis:    Same  Procedure:                 Laparoscopic cholecystectomy  Surgeon:                     [name]. Derrell Lolling, M.D., FACS  Assistant:                      Glenna Fellows, M.D., FACS  Operative Indications:   Angel Dawson is a 32 y.o. female. She Is brought to the hospital electively for cholecystectomy.  She was initially referred by Dr. Terressa Koyanagi at the emergency department for evaluation of biliary tract disease.  She has no prior history of biliary tract disease or liver disease or hepatitis. On June 15 she had an episode of abdominal pain. Liver function tests were normal. The pain came back on June 19 was very severe pain in the epigastrium, took  20 minutes to walk to her car. She developed nausea and vomiting the next day and went back to the emergency room. This was at [facility]. Liver function tests showed total bilirubin 3.1, lipase 85, AST 462, ALT 681. CT scan showed a cyst of the left kidney but was otherwise negative. Ultrasound showed gallstones versus sludge in the gallbladder but no acute changes. CBD was 5 mm. She became asymptomatic. About 4 or 5 days later she had a four-hour episode of right upper quadrant pain that resolved she's been asymptomatic since.  MRCP shows small gallstones, normal CBD, no evidence of CBD stones. Hepatitis screening was negative. LFTs were improved with total bilirubin 0.5, alkaline phosphatase 156, AST 66 and ALT 267. She has had one more episode of right upper quadrant pain nausea and vomiting which lasted 3 hours since I last saw her. She feels fine today.Preop liver function tests are normal.   Comorbidities include morbid obesity, hypertension.   Operative Findings:       The patient is morbidly obese with a BMI of 56. This increased the technical difficulty of  the case and made visualization much more difficult. The gallbladder was thin-walled, chronically inflamed, and had minimal adhesions to it. The gallbladder was long and we had to place a fifth trocar to retract the duodenum posteriorly to see the cystic duct and the infundibulum. There were small stones in the cystic duct which were milked out. We attempted cholangiogram on numerous occasions but were unable to get good flow through the small cystic duct. Because her LFTs are now normal and the MRCP showed no evidence of CBD stones , we abandoned the cholangiogram. The patient's liver looked normal. The omentum was huge. There were no other gross abnormalities. Stomach and duodenum looked normal.  Procedure in Detail:          Following the induction of general endotracheal anesthesia, the patient's abdomen was prepped and draped in a sterile fashion and intravenous antibiotics were given. Surgical time out was performed. 0.5% Marcaine with epinephrine was used as local infiltration anesthetic. A 5 mm optical trocar was placed in the right upper quadrant and pneumoperitoneum created. Visualization was good. There is no evidence of injury or bleeding. Another 5 mm trocar was placed in the  right upper quadrant somewhat more laterally under direct vision. An 11 mm trocar was placed in the subxiphoid region and an 11 mm trocar placed in the midline just above the umbilicus. We ultimately placed another 5 mm trocar in the midepigastrium for traction. The gallbladder fundus was identified and elevated. We retracted the duodenum inferiorly. We lifted up on the infundibulum and took some adhesions down. We very carefully dissected the peritoneum off of the cystic duct and cystic artery. We're able to isolate the cystic artery as it went ontoThere the wall the gallbladder, isolated with multiple clips and divided it. We found a tiny posterior cystic artery branch and divided that as well. We then slowly created a window  behind the cystic duct. Once we had good anatomy we placed a clip on the cystic duct right at the infundibulum of the gallbladder. We made a small opening in the cystic duct and milked bile out of the cystic duct and we removed some tiny 2 mm stones out of the cystic duct. We attempted to insert a Cook catheter on multiple occasions we were able to insert the catheter but we did not get any flow. We postulated that there might be a stenosis, a valve, or possibly even a tiny stone but we never could identify that. We abandoned the cholangiogram after multiple attempts. I controlled the cystic duct quite nicely with 3 good clips and divided it. The gallbladder was dissected from its bed with electrocautery placed in a specimen bag and removed. The operative field was irrigated. Hemostasis was excellent. At the completion of the case the irrigation fluid was completely clear there was no bleeding or bile leak. All irrigation fluid was removed. The pneumoperitoneum was released. The trocars were removed. The skin incisions were closed with subcuticular sutures of 4-0 Monocryl and Dermabond. Patient tolerated the procedure well was taken to recovery in stable condition. EBL 15 cc. Counts correct. Complications none.     [name]. Derrell Lolling, M.D., FACS General and Minimally Invasive Surgery Breast and Colorectal Surgery  06/22/2013 10:39 AM

## 2013-06-22 NOTE — Progress Notes (Signed)
States she has had 2 episodes of pain and nausea "gall bladder attack" since visit to [facility].

## 2013-06-22 NOTE — Transfer of Care (Signed)
Immediate Anesthesia Transfer of Care Note  Patient: Angel Dawson  Procedure(s) Performed: Procedure(s): LAPAROSCOPIC CHOLECYSTECTOMY  (N/A)  Patient Location: PACU  Anesthesia Type:General  Level of Consciousness: sedated  Airway & Oxygen Therapy: Patient Spontanous Breathing and Patient connected to face mask oxygen  Post-op Assessment: Report given to PACU RN and Post -op Vital signs reviewed and stable  Post vital signs: Reviewed and stable  Complications: No apparent anesthesia complications

## 2013-06-22 NOTE — Anesthesia Postprocedure Evaluation (Signed)
  Anesthesia Post-op Note  Patient: Angel Dawson  Procedure(s) Performed: Procedure(s) (LRB): LAPAROSCOPIC CHOLECYSTECTOMY  (N/A)  Patient Location: PACU  Anesthesia Type: General  Level of Consciousness: awake and alert   Airway and Oxygen Therapy: Patient Spontanous Breathing  Post-op Pain: mild  Post-op Assessment: Post-op Vital signs reviewed, Patient's Cardiovascular Status Stable, Respiratory Function Stable, Patent Airway and No signs of Nausea or vomiting  Last Vitals:  Filed Vitals:   06/22/13 1200  BP: 144/73  Pulse: 75  Temp:   Resp: 16    Post-op Vital Signs: stable   Complications: No apparent anesthesia complications

## 2013-06-22 NOTE — Preoperative (Signed)
Beta Blockers   Reason not to administer Beta Blockers:Not Applicable 

## 2013-06-22 NOTE — Anesthesia Procedure Notes (Signed)
Procedure Name: Intubation Date/Time: 06/22/2013 8:48 AM Performed by: Leroy Libman L Patient Re-evaluated:Patient Re-evaluated prior to inductionOxygen Delivery Method: Circle system utilized Preoxygenation: Pre-oxygenation with 100% oxygen Intubation Type: IV induction Ventilation: Mask ventilation without difficulty and Oral airway inserted - appropriate to patient size Laryngoscope Size: Miller and 3 Grade View: Grade I Tube type: Oral Tube size: 7.5 mm Number of attempts: 1 Airway Equipment and Method: Stylet and Patient positioned with wedge pillow Placement Confirmation: ETT inserted through vocal cords under direct vision,  breath sounds checked- equal and bilateral and positive ETCO2 Secured at: 24 cm Tube secured with: Tape Dental Injury: Teeth and Oropharynx as per pre-operative assessment

## 2013-06-22 NOTE — Interval H&P Note (Signed)
History and Physical Interval Note:  06/22/2013 8:09 AM  Angel Dawson  has presented today for surgery, with the diagnosis of gall stones   The goals and the various methods of treatment have been discussed with the patient and family. After consideration of risks, benefits and other options for treatment, the patient has consented to  Procedure(s): LAPAROSCOPIC CHOLECYSTECTOMY WITH INTRAOPERATIVE CHOLANGIOGRAM (N/A) as a surgical intervention .  The patient's history has been reviewed, patient examined, no change in status, stable for surgery.  I have reviewed the patient's chart and labs.  Questions were answered to the patient's satisfaction.     Ernestene Mention

## 2013-06-23 ENCOUNTER — Encounter (HOSPITAL_COMMUNITY): Payer: Self-pay | Admitting: General Surgery

## 2013-06-24 DIAGNOSIS — Z9049 Acquired absence of other specified parts of digestive tract: Secondary | ICD-10-CM | POA: Insufficient documentation

## 2013-06-29 ENCOUNTER — Emergency Department (HOSPITAL_COMMUNITY): Payer: BC Managed Care – PPO

## 2013-06-29 ENCOUNTER — Encounter (HOSPITAL_COMMUNITY): Payer: Self-pay

## 2013-06-29 ENCOUNTER — Emergency Department (HOSPITAL_COMMUNITY)
Admission: EM | Admit: 2013-06-29 | Discharge: 2013-06-29 | Disposition: A | Discharge status: Home / Self Care | Payer: BC Managed Care – PPO | Attending: Emergency Medicine | Admitting: Emergency Medicine

## 2013-06-29 DIAGNOSIS — Z8719 Personal history of other diseases of the digestive system: Secondary | ICD-10-CM | POA: Insufficient documentation

## 2013-06-29 DIAGNOSIS — R0789 Other chest pain: Secondary | ICD-10-CM | POA: Insufficient documentation

## 2013-06-29 DIAGNOSIS — R0602 Shortness of breath: Secondary | ICD-10-CM | POA: Insufficient documentation

## 2013-06-29 DIAGNOSIS — K5900 Constipation, unspecified: Secondary | ICD-10-CM | POA: Insufficient documentation

## 2013-06-29 DIAGNOSIS — Z79899 Other long term (current) drug therapy: Secondary | ICD-10-CM | POA: Insufficient documentation

## 2013-06-29 DIAGNOSIS — I10 Essential (primary) hypertension: Secondary | ICD-10-CM | POA: Insufficient documentation

## 2013-06-29 DIAGNOSIS — M79609 Pain in unspecified limb: Secondary | ICD-10-CM | POA: Insufficient documentation

## 2013-06-29 DIAGNOSIS — Z8709 Personal history of other diseases of the respiratory system: Secondary | ICD-10-CM | POA: Insufficient documentation

## 2013-06-29 LAB — TROPONIN I: Troponin I: <0.3 ng/mL (ref <0.30)

## 2013-06-29 LAB — CBC
MCV: 86.1 fL (ref 78.0–100.0)
Platelets: 364 10*3/uL (ref 150–400)
RBC: 4.46 MIL/uL (ref 3.87–5.11)
RDW: 13.4 % (ref 11.5–15.5)
WBC: 10.9 10*3/uL — ABNORMAL HIGH (ref 4.0–10.5)

## 2013-06-29 LAB — BASIC METABOLIC PANEL
Chloride: 98 mEq/L (ref 96–112)
GFR calc Af Amer: >90 mL/min (ref >90)
GFR calc non Af Amer: 85 mL/min — ABNORMAL LOW (ref >90)
Potassium: 3.7 mEq/L (ref 3.5–5.1)
Sodium: 137 mEq/L (ref 135–145)

## 2013-06-29 MED ORDER — IOHEXOL 350 MG/ML SOLN
100.0000 mL | Freq: Once | INTRAVENOUS | Status: AC | PRN
  Administered 2013-06-29: 100 mL via INTRAVENOUS

## 2013-06-29 MED ORDER — POLYETHYLENE GLYCOL 3350 17 G PO PACK: 17.0000 g | PACK | Freq: Every day | ORAL | Status: DC

## 2013-06-29 NOTE — ED Provider Notes (Signed)
CSN: [id number]     Arrival date & time 06/29/13  1733 History   First MD Initiated Contact with Patient 06/29/13 1853     Chief Complaint  Patient presents with  . Chest Pain  . Shortness of Breath  . Leg Pain   (Consider location/radiation/quality/duration/timing/severity/associated sxs/prior Treatment) HPI Comments: Patient is a super morbidly obese female who presents with chest tightness and shortness of breath that started last night has been worsening over the past several hours. She is tightness in the center of her chest and feels like he can't take a deep breath. She denies any cough or fever. She had a cholecystectomy 7 days ago. Her wounds are healing well. She's been constipated last bowel movement was 3 days ago. No vomiting. No abdominal pain. She also complains of bilateral leg soreness has been ongoing since the surgery. No difficulty walking or talking. She went back to work today at her best job. No lifting or exertional chest pain.  The history is provided by the patient.    Past Medical History  Diagnosis Date  . Hypertension   . GERD (gastroesophageal reflux disease)   . Abdominal pain     related to gallstones  . Gallstones   . Sinus congestion     occ. uses OTC meds for this  . Sleep apnea 06-15-13    Was told test nonconclusive, unable to sleep and remain on back..Stop/Bang score =4   Past Surgical History  Procedure Laterality Date  . Tonsillectomy    . Cholecystectomy N/A 06/22/2013    Procedure: LAPAROSCOPIC CHOLECYSTECTOMY ;  Surgeon: Ernestene Mention, MD;  Location: [facility];  Service: General;  Laterality: N/A;   History reviewed. No pertinent family history. History  Substance Use Topics  . Smoking status: Never Smoker   . Smokeless tobacco: Not on file  . Alcohol Use: Yes     Comment: rare   OB History   Grav Para Term Preterm Abortions TAB SAB Ect Mult Living                 Review of Systems  Constitutional: Negative for fever, activity  change and appetite change.  HENT: Negative for congestion and rhinorrhea.   Eyes: Negative for visual disturbance.  Respiratory: Positive for chest tightness and shortness of breath. Negative for cough.   Cardiovascular: Positive for chest pain.  Gastrointestinal: Negative for nausea, vomiting and abdominal pain.  Genitourinary: Negative for dysuria, hematuria, vaginal bleeding and vaginal discharge.  Musculoskeletal: Negative for back pain.  Skin: Negative for rash.  Neurological: Negative for dizziness, weakness and headaches.  A complete 10 system review of systems was obtained and all systems are negative except as noted in the HPI and PMH.    Allergies  Review of patient's allergies indicates no known allergies.  Home Medications   Current Outpatient Rx  Name  Route  Sig  Dispense  Refill  . bisacodyl (BISACODYL) 5 MG EC tablet   Oral   Take 5 mg by mouth daily as needed for constipation.         . carvedilol (COREG) 12.5 MG tablet   Oral   Take 12.5 mg by mouth 2 (two) times daily with a meal. Will only take once daily for now         . famotidine (PEPCID AC) 10 MG chewable tablet   Oral   Chew 10 mg by mouth 2 (two) times daily.         Marland Kitchen HYDROcodone-acetaminophen (  NORCO/VICODIN) 5-325 MG per tablet   Oral   Take 1 tablet by mouth every 4 (four) hours as needed for pain.          Marland Kitchen lisinopril-hydrochlorothiazide (PRINZIDE,ZESTORETIC) 20-25 MG per tablet   Oral   Take 1 tablet by mouth every morning.          . Multiple Vitamin (MULTIVITAMIN WITH MINERALS) TABS tablet   Oral   Take 1 tablet by mouth daily.         Marland Kitchen oxyCODONE-acetaminophen (PERCOCET) 5-325 MG per tablet   Oral   Take 1 tablet by mouth every 6 (six) hours as needed for pain.   17 tablet   0   . simethicone (MYLICON) 80 MG chewable tablet   Oral   Chew 80 mg by mouth every 6 (six) hours as needed for flatulence.         . polyethylene glycol (MIRALAX / GLYCOLAX) packet    Oral   Take 17 g by mouth daily.   14 each   0    BP 144/79  Pulse 83  Temp(Src) 98.3 F (36.8 C) (Oral)  Resp 20  SpO2 100%  LMP 06/21/2013 Physical Exam  Constitutional: She is oriented to person, place, and time. She appears well-developed and well-nourished. No distress.  HENT:  Head: Normocephalic and atraumatic.  Mouth/Throat: Oropharynx is clear and moist. No oropharyngeal exudate.  Eyes: Conjunctivae and EOM are normal. Pupils are equal, round, and reactive to light.  Neck: Normal range of motion. Neck supple.  Cardiovascular: Normal rate, regular rhythm and normal heart sounds.   No murmur heard. Pulmonary/Chest: Effort normal and breath sounds normal. No respiratory distress. She exhibits no tenderness.  Abdominal: Soft. There is no tenderness. There is no rebound and no guarding.  Surgical incisions healing well. No cellulitis. Abdomen is soft and nontender.  Musculoskeletal: Normal range of motion. She exhibits no edema and no tenderness.  +2 DP and PT pulses bilaterally. No asymmetry. No calf tenderness.  Neurological: She is alert and oriented to person, place, and time. No cranial nerve deficit. She exhibits normal muscle tone. Coordination normal.  Skin: Skin is warm.    ED Course  Procedures (including critical care time) Labs Review Labs Reviewed  CBC - Abnormal; Notable for the following:    WBC 10.9 (*)    All other components within normal limits  BASIC METABOLIC PANEL - Abnormal; Notable for the following:    Glucose, Bld 163 (*)    GFR calc non Af Amer 85 (*)    All other components within normal limits  PRO B NATRIURETIC PEPTIDE  TROPONIN I   Imaging Review Dg Chest 2 View  06/29/2013   CLINICAL DATA:  Chest pain and shortness of Breath  EXAM: CHEST  2 VIEW  COMPARISON:  March 31, 2013  FINDINGS: Lungs are clear. Heart size and pulmonary vascularity are normal. No adenopathy. No pneumothorax. No bone lesions.  IMPRESSION: No abnormality noted.    Electronically Signed   By: Bretta Bang   On: 06/29/2013 19:01   Ct Angio Chest Pe W/cm &/or Wo Cm  06/29/2013   CLINICAL DATA:  Chest pain and shortness of Breath  EXAM: CT ANGIOGRAPHY CHEST WITH CONTRAST  TECHNIQUE: Multidetector CT imaging of the chest was performed using the standard protocol during bolus administration of intravenous contrast. Multiplanar CT image reconstructions including MIPs were obtained to evaluate the vascular anatomy.  CONTRAST:  OMNIPAQUE IOHEXOL 350 MG/ML SOLN  COMPARISON:  Chest radiograph June 29, 2013  FINDINGS: There is no demonstrable pulmonary embolus. There is no thoracic aortic aneurysm or dissection.  Lungs are clear. There is no appreciable thoracic adenopathy. Pericardium is not thickened.  Visualized upper abdominal structures appear normal. No bone lesions are identified.  Review of the MIP images confirms the above findings.  IMPRESSION: Lungs clear. No demonstrable pulmonary embolus.   Electronically Signed   By: Bretta Bang   On: 06/29/2013 19:48    MDM   1. Chest discomfort    Chest tightness and shortness of breath since last night. One week postop gallbladder surgery. No hypoxia or tachycardia.  Chest x-ray is negative for pneumonia. EKG normal sinus rhythm.  CT is negative for pulmonary embolism. Unable to obtain Dopplers today. Offered to arrange this for patient tomorrow but my suspicion for DVT is very low. Do not feel empiric lovenox indicated Patient stable for followup with her surgeon as scheduled. She'll return to ED with worsening symptoms. We'll give Maalox for constipation. Patient's abdominal exam is benign. She has no right upper quadrant pain, guarding, rebound. Her surgical incisions are healing well. She stable followup with her surgeon as scheduled.    Date: 06/29/2013  Rate: 81  Rhythm: normal sinus rhythm  QRS Axis: normal  Intervals: PR prolonged  ST/T Wave abnormalities: nonspecific ST/T changes   Conduction Disutrbances:first-degree A-V block   Narrative Interpretation:   Old EKG Reviewed: none available      Glynn Octave, MD 06/29/13 2037

## 2013-06-29 NOTE — ED Notes (Signed)
Pt c/o L side chest tightness and SOB starting last night and increasing over the last 3 hours and bilateral leg pain x 1 week.  Pain score 3/10.  Pt sts difficulty and increased pain when trying to take a deep breath.  Pt has surgery 9/10 for gallbladder removal.  Vitals are stable.

## 2013-06-29 NOTE — ED Notes (Signed)
Bed: WA18 Expected date:  Expected time:  Means of arrival:  Comments: Hold triage 

## 2013-06-30 ENCOUNTER — Ambulatory Visit (HOSPITAL_COMMUNITY): Admission: RE | Admit: 2013-06-30 | Payer: BC Managed Care – PPO | Source: Ambulatory Visit

## 2013-07-01 ENCOUNTER — Inpatient Hospital Stay (HOSPITAL_COMMUNITY): Payer: BC Managed Care – PPO

## 2013-07-01 ENCOUNTER — Encounter (HOSPITAL_BASED_OUTPATIENT_CLINIC_OR_DEPARTMENT_OTHER): Payer: Self-pay | Admitting: *Deleted

## 2013-07-01 ENCOUNTER — Emergency Department (HOSPITAL_BASED_OUTPATIENT_CLINIC_OR_DEPARTMENT_OTHER): Payer: BC Managed Care – PPO

## 2013-07-01 ENCOUNTER — Inpatient Hospital Stay (HOSPITAL_BASED_OUTPATIENT_CLINIC_OR_DEPARTMENT_OTHER)
Admission: EM | Admit: 2013-07-01 | Discharge: 2013-07-04 | DRG: 204 | Disposition: A | Discharge status: Home / Self Care | Payer: BC Managed Care – PPO | Attending: Internal Medicine | Admitting: Internal Medicine

## 2013-07-01 DIAGNOSIS — K219 Gastro-esophageal reflux disease without esophagitis: Secondary | ICD-10-CM | POA: Diagnosis present

## 2013-07-01 DIAGNOSIS — K5900 Constipation, unspecified: Secondary | ICD-10-CM | POA: Diagnosis present

## 2013-07-01 DIAGNOSIS — K859 Acute pancreatitis without necrosis or infection, unspecified: Principal | ICD-10-CM | POA: Diagnosis present

## 2013-07-01 DIAGNOSIS — R7402 Elevation of levels of lactic acid dehydrogenase (LDH): Secondary | ICD-10-CM | POA: Diagnosis present

## 2013-07-01 DIAGNOSIS — Z79899 Other long term (current) drug therapy: Secondary | ICD-10-CM

## 2013-07-01 DIAGNOSIS — R748 Abnormal levels of other serum enzymes: Secondary | ICD-10-CM

## 2013-07-01 DIAGNOSIS — R52 Pain, unspecified: Secondary | ICD-10-CM | POA: Diagnosis present

## 2013-07-01 DIAGNOSIS — T819XXA Unspecified complication of procedure, initial encounter: Secondary | ICD-10-CM

## 2013-07-01 DIAGNOSIS — I10 Essential (primary) hypertension: Secondary | ICD-10-CM | POA: Diagnosis present

## 2013-07-01 DIAGNOSIS — Z6841 Body Mass Index (BMI) 40.0 and over, adult: Secondary | ICD-10-CM

## 2013-07-01 DIAGNOSIS — K831 Obstruction of bile duct: Secondary | ICD-10-CM | POA: Diagnosis present

## 2013-07-01 DIAGNOSIS — R1013 Epigastric pain: Secondary | ICD-10-CM | POA: Diagnosis present

## 2013-07-01 DIAGNOSIS — G4730 Sleep apnea, unspecified: Secondary | ICD-10-CM | POA: Diagnosis present

## 2013-07-01 DIAGNOSIS — K8020 Calculus of gallbladder without cholecystitis without obstruction: Secondary | ICD-10-CM | POA: Diagnosis present

## 2013-07-01 DIAGNOSIS — R109 Unspecified abdominal pain: Secondary | ICD-10-CM

## 2013-07-01 DIAGNOSIS — R740 Nonspecific elevation of levels of transaminase and lactic acid dehydrogenase [LDH]: Secondary | ICD-10-CM

## 2013-07-01 DIAGNOSIS — R7401 Elevation of levels of liver transaminase levels: Secondary | ICD-10-CM

## 2013-07-01 LAB — CBC WITH DIFFERENTIAL/PLATELET
Basophils Absolute: 0 10*3/uL (ref 0.0–0.1)
HCT: 36.5 % (ref 36.0–46.0)
Lymphocytes Relative: 37 % (ref 12–46)
Lymphs Abs: 2 10*3/uL (ref 0.7–4.0)
Monocytes Absolute: 0.4 10*3/uL (ref 0.1–1.0)
Neutro Abs: 2.9 10*3/uL (ref 1.7–7.7)
Platelets: 289 10*3/uL (ref 150–400)
RBC: 4.16 MIL/uL (ref 3.87–5.11)
RDW: 13.1 % (ref 11.5–15.5)
WBC: 5.4 10*3/uL (ref 4.0–10.5)

## 2013-07-01 LAB — URINALYSIS, ROUTINE W REFLEX MICROSCOPIC
Glucose, UA: NEGATIVE mg/dL
Leukocytes, UA: NEGATIVE
Nitrite: NEGATIVE
Specific Gravity, Urine: 1.039 — ABNORMAL HIGH (ref 1.005–1.030)
pH: 7.5 (ref 5.0–8.0)

## 2013-07-01 LAB — COMPREHENSIVE METABOLIC PANEL
Albumin: 3.8 g/dL (ref 3.5–5.2)
Alkaline Phosphatase: 153 U/L — ABNORMAL HIGH (ref 39–117)
BUN: 10 mg/dL (ref 6–23)
Creatinine, Ser: 0.9 mg/dL (ref 0.50–1.10)
GFR calc Af Amer: >90 mL/min (ref >90)
Glucose, Bld: 132 mg/dL — ABNORMAL HIGH (ref 70–99)
Potassium: 3.8 mEq/L (ref 3.5–5.1)
Total Protein: 7 g/dL (ref 6.0–8.3)

## 2013-07-01 MED ORDER — ENOXAPARIN SODIUM 100 MG/ML ~~LOC~~ SOLN
85.0000 mg | SUBCUTANEOUS | Status: DC
  Administered 2013-07-01 – 2013-07-03 (×3): 85 mg via SUBCUTANEOUS
  Filled 2013-07-01 (×4): qty 1

## 2013-07-01 MED ORDER — FENTANYL CITRATE 0.05 MG/ML IJ SOLN
100.0000 ug | Freq: Once | INTRAMUSCULAR | Status: AC
  Administered 2013-07-01: 100 ug via INTRAVENOUS
  Filled 2013-07-01: qty 2

## 2013-07-01 MED ORDER — FAMOTIDINE IN NACL 20-0.9 MG/50ML-% IV SOLN
20.0000 mg | INTRAVENOUS | Status: DC
  Administered 2013-07-01 – 2013-07-03 (×3): 20 mg via INTRAVENOUS
  Filled 2013-07-01 (×4): qty 50

## 2013-07-01 MED ORDER — LISINOPRIL-HYDROCHLOROTHIAZIDE 20-25 MG PO TABS: 1.0000 | ORAL_TABLET | Freq: Every morning | ORAL | Status: DC

## 2013-07-01 MED ORDER — HYDROCHLOROTHIAZIDE 25 MG PO TABS
25.0000 mg | ORAL_TABLET | Freq: Every day | ORAL | Status: DC
  Administered 2013-07-01 – 2013-07-04 (×4): 25 mg via ORAL
  Filled 2013-07-01 (×4): qty 1

## 2013-07-01 MED ORDER — ONDANSETRON HCL 4 MG/2ML IJ SOLN
4.0000 mg | Freq: Four times a day (QID) | INTRAMUSCULAR | Status: DC | PRN
  Administered 2013-07-02 – 2013-07-03 (×2): 4 mg via INTRAVENOUS
  Filled 2013-07-01 (×2): qty 2

## 2013-07-01 MED ORDER — SODIUM CHLORIDE 0.9 % IV SOLN: INTRAVENOUS | Status: DC

## 2013-07-01 MED ORDER — FENTANYL CITRATE 0.05 MG/ML IJ SOLN
100.0000 ug | Freq: Once | INTRAMUSCULAR | Status: AC
  Administered 2013-07-01: 100 ug via INTRAVENOUS

## 2013-07-01 MED ORDER — CARVEDILOL 12.5 MG PO TABS
12.5000 mg | ORAL_TABLET | Freq: Two times a day (BID) | ORAL | Status: DC
  Administered 2013-07-02 – 2013-07-04 (×5): 12.5 mg via ORAL
  Filled 2013-07-01 (×7): qty 1

## 2013-07-01 MED ORDER — MORPHINE SULFATE 4 MG/ML IJ SOLN
6.0000 mg | Freq: Once | INTRAMUSCULAR | Status: AC
  Administered 2013-07-01: 6 mg via INTRAVENOUS
  Filled 2013-07-01: qty 2

## 2013-07-01 MED ORDER — FAMOTIDINE 10 MG PO TABS
10.0000 mg | ORAL_TABLET | Freq: Two times a day (BID) | ORAL | Status: DC | PRN
  Filled 2013-07-01: qty 1

## 2013-07-01 MED ORDER — IOHEXOL 300 MG/ML  SOLN
50.0000 mL | Freq: Once | INTRAMUSCULAR | Status: AC | PRN
  Administered 2013-07-01: 50 mL via ORAL

## 2013-07-01 MED ORDER — HYDROMORPHONE HCL PF 1 MG/ML IJ SOLN: 0.5000 mg | INTRAMUSCULAR | Status: DC | PRN

## 2013-07-01 MED ORDER — LISINOPRIL 20 MG PO TABS
20.0000 mg | ORAL_TABLET | Freq: Every day | ORAL | Status: DC
  Administered 2013-07-01 – 2013-07-04 (×4): 20 mg via ORAL
  Filled 2013-07-01 (×4): qty 1

## 2013-07-01 MED ORDER — SODIUM CHLORIDE 0.9 % IV SOLN
INTRAVENOUS | Status: DC
  Administered 2013-07-01 – 2013-07-02 (×5): via INTRAVENOUS

## 2013-07-01 MED ORDER — ONDANSETRON HCL 4 MG/2ML IJ SOLN
4.0000 mg | Freq: Once | INTRAMUSCULAR | Status: AC
  Administered 2013-07-01: 4 mg via INTRAVENOUS
  Filled 2013-07-01: qty 2

## 2013-07-01 MED ORDER — IOHEXOL 300 MG/ML  SOLN
100.0000 mL | Freq: Once | INTRAMUSCULAR | Status: AC | PRN
  Administered 2013-07-01: 100 mL via INTRAVENOUS

## 2013-07-01 MED ORDER — MORPHINE SULFATE 2 MG/ML IJ SOLN
1.0000 mg | INTRAMUSCULAR | Status: DC | PRN
  Administered 2013-07-01 (×2): 2 mg via INTRAVENOUS
  Filled 2013-07-01 (×2): qty 1

## 2013-07-01 MED ORDER — TECHNETIUM TC 99M MEBROFENIN IV KIT
5.3000 | PACK | Freq: Once | INTRAVENOUS | Status: AC | PRN
  Administered 2013-07-01: 5.3 via INTRAVENOUS

## 2013-07-01 MED ORDER — FENTANYL CITRATE 0.05 MG/ML IJ SOLN
INTRAMUSCULAR | Status: AC
  Administered 2013-07-01: 100 ug via INTRAVENOUS
  Filled 2013-07-01: qty 2

## 2013-07-01 MED ORDER — ONDANSETRON HCL 4 MG/2ML IJ SOLN: 4.0000 mg | Freq: Three times a day (TID) | INTRAMUSCULAR | Status: DC | PRN

## 2013-07-01 MED ORDER — HYDROMORPHONE HCL PF 1 MG/ML IJ SOLN
0.5000 mg | INTRAMUSCULAR | Status: DC | PRN
  Administered 2013-07-01 – 2013-07-02 (×2): 0.5 mg via INTRAVENOUS
  Filled 2013-07-01 (×3): qty 1

## 2013-07-01 MED ORDER — ONDANSETRON HCL 4 MG PO TABS
4.0000 mg | ORAL_TABLET | Freq: Four times a day (QID) | ORAL | Status: DC | PRN
  Administered 2013-07-01: 4 mg via ORAL
  Filled 2013-07-01: qty 1

## 2013-07-01 NOTE — ED Provider Notes (Signed)
8:43 AM [facility] to evaluate the patient. Given normal CT scan they are requesting medical admission for pancreatitis. [facility] consultation pending. hospitalist admission. Repeat pain meds given in ER. Nausea controlled. NPO. May benefit from ERCP vs MRCP. Intraoperative cholangiogram not performed. CT without evidence of choledocholithiasis.  Surgical wounds look good  1. Postoperative or surgical complication, initial encounter   2. Pancreatitis   3. Elevated liver enzymes      Lyanne Co, MD 07/01/13 [phone]

## 2013-07-01 NOTE — ED Notes (Signed)
C/o RUQ pain since 9:30pm. Pt had gall bladder removed on 9/10 at [facility].

## 2013-07-01 NOTE — Care Management Note (Signed)
   CARE MANAGEMENT NOTE 07/01/2013  Patient:  Angel Dawson,Angel Dawson   Account Number:  [id number]  Date Initiated:  07/01/2013  Documentation initiated by:  Maylynn Orzechowski  Subjective/Objective Assessment:   32 yo female admitted with abdominal pain. S/P Laparoscopic Cholecystectomy 06/22/13. No PCP on record.     Action/Plan:   home when stable   Anticipated DC Date:     Anticipated DC Plan:  HOME/SELF CARE      DC Planning Services  CM consult      Choice offered to / List presented to:  NA   DME arranged  NA      DME agency  NA     HH arranged  NA      HH agency  NA   Status of service:  In process, will continue to follow Medicare Important Message given?   (If response is "NO", the following Medicare IM given date fields will be blank) Date Medicare IM given:   Date Additional Medicare IM given:    Discharge Disposition:    Per UR Regulation:  Reviewed for med. necessity/level of care/duration of stay  If discussed at Long Length of Stay Meetings, dates discussed:    Comments:  07/01/13 1402 Anicia Leuthold,RN,MSN [phone] Chart reviewed for utilization of services. No HH services or DME needs assessed.

## 2013-07-01 NOTE — Consult Note (Signed)
Reason for Consult: abdominal pain Referring Physician: Dr. Patria Mane  HPI: Angel Dawson is a 32 year old AAF with a history of HTN, obesity and laparoscopic cholecystectomy by Dr. Claud Kelp on 06/22/13 who was referred to [facility] due to abdominal pain.  Onset was sudden last night at 2130.  She ate dinner at 2045 which consisted of vegetables, grilled chicken.  Location of pain is epigastric.  Characterized as "doubling over pain" and "worst gallbladder attack I've ever had."  This lasted for about then decreased in severity.  Modifying factors included; gas-x and then she went to sleep.  She was awaken by the pain around 1AM which continued to worsen, by the time she arrived to the ED it was 10/10 in severity.  She denies radiation of pain.  Associated with nausea.  She denies fever, chills, vomiting.  Denies diarrhea.  She complains of constipation, last bm 2 days ago.  She denies melena or hematochezia.  She has been doing fairly well since surgery until last night, stopped taking pain meds on the 12th.  She was seen in the ED on the 17th due to "chest tightness"  CT of chest was negative for PE and negative troponin, not increased with activity, fatigue, nausea.  She does not have a cardiac history.  She denies alcohol or tobacco use.  At present time she rates her pain 0.5/10 and feels "pressure."  She received fentanyl and morphine here in ED.    Past Medical History  Diagnosis Date  . Hypertension   . GERD (gastroesophageal reflux disease)   . Abdominal pain     related to gallstones  . Gallstones   . Sinus congestion     occ. uses OTC meds for this  . Sleep apnea 06-15-13    Was told test nonconclusive, unable to sleep and remain on back..Stop/Bang score =4    Past Surgical History  Procedure Laterality Date  . Tonsillectomy    . Cholecystectomy N/A 06/22/2013    Procedure: LAPAROSCOPIC CHOLECYSTECTOMY ;  Surgeon: Ernestene Mention, MD;  Location: [facility];  Service: General;   Laterality: N/A;    Family History  Problem Relation Age of Onset  . Hypertension Mother   . Diabetes Mother   . Hypertension Father   . Diabetes Father   . Stroke Paternal Grandfather     Social History:  reports that she has never smoked. She has never used smokeless tobacco. She reports that  drinks alcohol. She reports that she does not use illicit drugs.  Allergies: No Known Allergies  Medications: medication reviewed  Results for orders placed during the hospital encounter of 07/01/13 (from the past 48 hour(s))  LIPASE, BLOOD     Status: Abnormal   Collection Time    07/01/13  2:54 AM      Result Value Range   Lipase 280 (*) 11 - 59 U/L  COMPREHENSIVE METABOLIC PANEL     Status: Abnormal   Collection Time    07/01/13  2:54 AM      Result Value Range   Sodium 139  135 - 145 mEq/L   Potassium 3.8  3.5 - 5.1 mEq/L   Chloride 102  96 - 112 mEq/L   CO2 28  19 - 32 mEq/L   Glucose, Bld 132 (*) 70 - 99 mg/dL   BUN 10  6 - 23 mg/dL   Creatinine, Ser 8.65  0.50 - 1.10 mg/dL   Calcium 78.4  8.4 - 69.6 mg/dL  Total Protein 7.0  6.0 - 8.3 g/dL   Albumin 3.8  3.5 - 5.2 g/dL   AST 161 (*) 0 - 37 U/L   ALT 403 (*) 0 - 35 U/L   Alkaline Phosphatase 153 (*) 39 - 117 U/L   Total Bilirubin 0.9  0.3 - 1.2 mg/dL   GFR calc non Af Amer 82 (*) >90 mL/min   GFR calc Af Amer >90  >90 mL/min   Comment: (NOTE)     The eGFR has been calculated using the CKD EPI equation.     This calculation has not been validated in all clinical situations.     eGFR's persistently <90 mL/min signify possible Chronic Kidney     Disease.  CBC WITH DIFFERENTIAL     Status: Abnormal   Collection Time    07/01/13  2:54 AM      Result Value Range   WBC 5.4  4.0 - 10.5 K/uL   RBC 4.16  3.87 - 5.11 MIL/uL   Hemoglobin 11.8 (*) 12.0 - 15.0 g/dL   HCT 09.6  04.5 - 40.9 %   MCV 87.7  78.0 - 100.0 fL   MCH 28.4  26.0 - 34.0 pg   MCHC 32.3  30.0 - 36.0 g/dL   RDW 81.1  91.4 - 78.2 %   Platelets 289  150 -  400 K/uL   Neutrophils Relative % 54  43 - 77 %   Neutro Abs 2.9  1.7 - 7.7 K/uL   Lymphocytes Relative 37  12 - 46 %   Lymphs Abs 2.0  0.7 - 4.0 K/uL   Monocytes Relative 8  3 - 12 %   Monocytes Absolute 0.4  0.1 - 1.0 K/uL   Eosinophils Relative 1  0 - 5 %   Eosinophils Absolute 0.1  0.0 - 0.7 K/uL   Basophils Relative 0  0 - 1 %   Basophils Absolute 0.0  0.0 - 0.1 K/uL  URINALYSIS, ROUTINE W REFLEX MICROSCOPIC     Status: Abnormal   Collection Time    07/01/13  6:37 AM      Result Value Range   Color, Urine YELLOW  YELLOW   APPearance CLEAR  CLEAR   Specific Gravity, Urine 1.039 (*) 1.005 - 1.030   pH 7.5  5.0 - 8.0   Glucose, UA NEGATIVE  NEGATIVE mg/dL   Hgb urine dipstick NEGATIVE  NEGATIVE   Bilirubin Urine NEGATIVE  NEGATIVE   Ketones, ur NEGATIVE  NEGATIVE mg/dL   Protein, ur NEGATIVE  NEGATIVE mg/dL   Urobilinogen, UA 1.0  0.0 - 1.0 mg/dL   Nitrite NEGATIVE  NEGATIVE   Leukocytes, UA NEGATIVE  NEGATIVE   Comment: MICROSCOPIC NOT DONE ON URINES WITH NEGATIVE PROTEIN, BLOOD, LEUKOCYTES, NITRITE, OR GLUCOSE <1000 mg/dL.    Dg Chest 2 View  06/29/2013   CLINICAL DATA:  Chest pain and shortness of Breath  EXAM: CHEST  2 VIEW  COMPARISON:  March 31, 2013  FINDINGS: Lungs are clear. Heart size and pulmonary vascularity are normal. No adenopathy. No pneumothorax. No bone lesions.  IMPRESSION: No abnormality noted.   Electronically Signed   By: Bretta Bang   On: 06/29/2013 19:01   Ct Angio Chest Pe W/cm &/or Wo Cm  06/29/2013   CLINICAL DATA:  Chest pain and shortness of Breath  EXAM: CT ANGIOGRAPHY CHEST WITH CONTRAST  TECHNIQUE: Multidetector CT imaging of the chest was performed using the standard protocol during bolus administration of intravenous contrast.  Multiplanar CT image reconstructions including MIPs were obtained to evaluate the vascular anatomy.  CONTRAST:  OMNIPAQUE IOHEXOL 350 MG/ML SOLN  COMPARISON:  Chest radiograph June 29, 2013  FINDINGS: There  is no demonstrable pulmonary embolus. There is no thoracic aortic aneurysm or dissection.  Lungs are clear. There is no appreciable thoracic adenopathy. Pericardium is not thickened.  Visualized upper abdominal structures appear normal. No bone lesions are identified.  Review of the MIP images confirms the above findings.  IMPRESSION: Lungs clear. No demonstrable pulmonary embolus.   Electronically Signed   By: Bretta Bang   On: 06/29/2013 19:48   Ct Abdomen Pelvis W Contrast  07/01/2013   *RADIOLOGY REPORT*  Clinical Data: Abdominal pain, status post cholecystectomy on 06/29/2013  CT ABDOMEN AND PELVIS WITH CONTRAST  Technique:  Multidetector CT imaging of the abdomen and pelvis was performed following the standard protocol during bolus administration of intravenous contrast.  Contrast: 50mL OMNIPAQUE IOHEXOL 300 MG/ML  SOLN, OMNIPAQUE IOHEXOL 300 MG/ML  SOLN  Comparison: Prior MRI from 04/18/2013  Findings: Visualized lung bases are clear.  The liver demonstrates a normal contrast enhanced appearance without evidence of focal lesion.  Postoperative changes from interval cholecystectomy are present. No fluid collection is seen within the gallbladder fossa to suggest abscess or myeloma.  No significant biliary ductal dilatation.  The spleen, adrenal glands, and pancreas are within normal limits.  4.1 x 3.1 cm left renal cyst is unchanged.  Kidneys are otherwise unremarkable.  There is no evidence of bowel obstruction.  The appendix is well visualized in the right lower quadrant and is of normal caliber and appearance without associate inflammatory changes to suggest acute appendicitis. No abnormal wall thickening enhancement is seen about the bowels.  Bladder and uterus are within normal limits.  No free air or fluid.  No pathologically enlarged intra-abdominal pelvic lymph nodes are identified.  No acute osseous abnormality identified.  IMPRESSION: Postoperative changes from interval cholecystectomy  without complication.  No CT evidence of acute intra-abdominal or pelvic process.   Original Report Authenticated By: Rise Mu, M.D.    Review of Systems  Constitutional: Negative for fever, chills and diaphoresis.  HENT: Negative for sore throat.   Respiratory: Negative for cough, shortness of breath and wheezing.   Cardiovascular: Negative for chest pain, palpitations, leg swelling and PND.  Gastrointestinal: Positive for heartburn, nausea, abdominal pain and constipation. Negative for vomiting, diarrhea, blood in stool and melena.  Genitourinary: Negative for dysuria, urgency and hematuria.  Musculoskeletal: Positive for back pain.  Neurological: Negative for dizziness, focal weakness, loss of consciousness, weakness and headaches.  Psychiatric/Behavioral: Negative for depression. The patient is not nervous/anxious.    Blood pressure 144/81, pulse 91, temperature 98.9 F (37.2 C), temperature source Oral, resp. rate 20, last menstrual period 06/21/2013, SpO2 98.00%. Physical Exam  Constitutional: She appears well-developed and well-nourished. No distress.  HENT:  Mouth/Throat: No oropharyngeal exudate.  Neck: Normal range of motion. Neck supple.  Cardiovascular: Normal rate, regular rhythm, normal heart sounds and intact distal pulses.  Exam reveals no gallop and no friction rub.   No murmur heard. Respiratory: Effort normal and breath sounds normal.  GI: Soft. Bowel sounds are normal. She exhibits no mass. There is no rebound and no guarding.  TTP epigastric region Well healed incisions   Lymphadenopathy:    She has no cervical adenopathy.  Skin: She is not diaphoretic.    Assessment/Plan: Abdominal pain S/p Laparoscopic Cholecystectomy 06/22/13(Dr. Derrell Lolling) Transaminitis  CT of  abdomen and pelvis is essentially normal.  We will proceed with a HIDA scan to rule out a bile leak. If HIDA scan is negative, proceed with a MRCP.  If it is positive or if she has a retained  CBD stone will obtain a GI consult for a ERCP.  She had a work up in July of 2014 for hepatitis which was negative.  Continue with NPO, IVF and pain control(resume after HIDA).  Surgery will continue to follow.    Ashok Norris ANP-BC Pager [phone]  07/01/2013, 9:36 AM

## 2013-07-01 NOTE — H&P (Signed)
[facility] History and Physical  Angel Dawson ZOX:[id number] DOB: 1981-08-15 DOA: 07/01/2013  Referring physician: Dr Patria Mane PCP: [name], MD  Specialists: Dr Derrell Lolling  Chief Complaint: abdominal pain  HPI: Angel Dawson is a 32 y.o. female with a history of HTN, obesity and laparoscopic cholecystectomy by Dr. Claud Kelp on 06/22/13 who was referred to [facility] due to abdominal pain. Her abd pain is epigastric, severe, no radiation, associated with nausea and vomiting, no diarrhea. No hematochezia. On arrival to ED, her lab work revealed elevated lipase and transaminases. She is referred to medical service for evaluation of possible transaminitis, pancreatitis, . Surgery consulted by ED.    Review of Systems: The patient denies anorexia, fever, weight loss,, vision loss, decreased hearing, hoarseness, chest pain,  peripheral edema, balance deficits, hemoptysis, melena, hematochezia, severe indigestion/heartburn, hematuria, incontinence, genital sores, muscle weakness, suspicious skin lesions, transient blindness, difficulty walking, depression, unusual weight change, abnormal bleeding, enlarged lymph nodes, angioedema, and breast masses.    Past Medical History  Diagnosis Date  . Hypertension   . GERD (gastroesophageal reflux disease)   . Abdominal pain     related to gallstones  . Gallstones   . Sinus congestion     occ. uses OTC meds for this  . Sleep apnea 06-15-13    Was told test nonconclusive, unable to sleep and remain on back..Stop/Bang score =4   Past Surgical History  Procedure Laterality Date  . Tonsillectomy    . Cholecystectomy N/A 06/22/2013    Procedure: LAPAROSCOPIC CHOLECYSTECTOMY ;  Surgeon: Ernestene Mention, MD;  Location: [facility];  Service: General;  Laterality: N/A;   Social History:  reports that she has never smoked. She has never used smokeless tobacco. She reports that  drinks alcohol. She reports that she does not use illicit drugs.  where does  patient live--home, No Known Allergies  Family History  Problem Relation Age of Onset  . Hypertension Mother   . Diabetes Mother   . Hypertension Father   . Diabetes Father   . Stroke Paternal Grandfather     Prior to Admission medications   Medication Sig Start Date End Date Taking? Authorizing Provider  bisacodyl (BISACODYL) 5 MG EC tablet Take 5 mg by mouth daily as needed for constipation.   Yes Historical Provider, MD  carvedilol (COREG) 12.5 MG tablet Take 12.5 mg by mouth 2 (two) times daily with a meal. Will only take once daily for now   Yes Historical Provider, MD  famotidine (PEPCID) 10 MG tablet Take 10 mg by mouth 2 (two) times daily as needed for heartburn.   Yes Historical Provider, MD  lisinopril-hydrochlorothiazide (PRINZIDE,ZESTORETIC) 20-25 MG per tablet Take 1 tablet by mouth every morning.    Yes Historical Provider, MD  Multiple Vitamin (MULTIVITAMIN WITH MINERALS) TABS tablet Take 1 tablet by mouth daily.   Yes Historical Provider, MD  Simethicone (GAS-X PO) Take 2 tablets by mouth as needed (gas relief).   Yes Historical Provider, MD   Physical Exam: Filed Vitals:   07/01/13 1012  BP: 142/84  Pulse: 73  Temp: 98.6 F (37 C)  Resp: 20    Constitutional: Vital signs reviewed.  Patient is a well-developed and well-nourished  in no acute distress and cooperative with exam. Alert and oriented x3.  Head: Normocephalic and atraumatic Mouth: no erythema or exudates, MMM Eyes: PERRL, EOMI, conjunctivae normal, No scleral icterus.  Neck: Supple, Trachea midline normal ROM, No JVD, mass, thyromegaly, or carotid bruit present.  Cardiovascular: RRR,  S1 normal, S2 normal, no MRG, pulses symmetric and intact bilaterally Pulmonary/Chest: normal respiratory effort, CTAB, no wheezes, rales, or rhonchi Abdominal: Soft. Tender in the epigastric area, non-distended, bowel sounds are normal, no masses, organomegaly, or guarding present.  Musculoskeletal: No joint deformities,  erythema, or stiffness, ROM full and no nontender Neurological: A&O x3, Strength is normal and symmetric bilaterally, cranial nerve II-XII are grossly intact, no focal motor deficit, sensory intact to light touch bilaterally.  Skin: Warm, dry and intact. No rash, cyanosis, or clubbing.      Labs on Admission:  Basic Metabolic Panel:  Recent Labs Lab 06/29/13 1755 07/01/13 0254  NA 137 139  K 3.7 3.8  CL 98 102  CO2 26 28  GLUCOSE 163* 132*  BUN 10 10  CREATININE 0.88 0.90  CALCIUM 10.1 10.0   Liver Function Tests:  Recent Labs Lab 07/01/13 0254  AST 686*  ALT 403*  ALKPHOS 153*  BILITOT 0.9  PROT 7.0  ALBUMIN 3.8    Recent Labs Lab 07/01/13 0254  LIPASE 280*   No results found for this basename: AMMONIA,  in the last 168 hours CBC:  Recent Labs Lab 06/29/13 1755 07/01/13 0254  WBC 10.9* 5.4  NEUTROABS  --  2.9  HGB 12.7 11.8*  HCT 38.4 36.5  MCV 86.1 87.7  PLT 364 289   Cardiac Enzymes:  Recent Labs Lab 06/29/13 1755  TROPONINI <0.30    BNP (last 3 results)  Recent Labs  06/29/13 1755  PROBNP <5.0   CBG: No results found for this basename: GLUCAP,  in the last 168 hours  Radiological Exams on Admission: Dg Chest 2 View  06/29/2013   CLINICAL DATA:  Chest pain and shortness of Breath  EXAM: CHEST  2 VIEW  COMPARISON:  March 31, 2013  FINDINGS: Lungs are clear. Heart size and pulmonary vascularity are normal. No adenopathy. No pneumothorax. No bone lesions.  IMPRESSION: No abnormality noted.   Electronically Signed   By: Bretta Bang   On: 06/29/2013 19:01   Ct Angio Chest Pe W/cm &/or Wo Cm  06/29/2013   CLINICAL DATA:  Chest pain and shortness of Breath  EXAM: CT ANGIOGRAPHY CHEST WITH CONTRAST  TECHNIQUE: Multidetector CT imaging of the chest was performed using the standard protocol during bolus administration of intravenous contrast. Multiplanar CT image reconstructions including MIPs were obtained to evaluate the vascular anatomy.   CONTRAST:  OMNIPAQUE IOHEXOL 350 MG/ML SOLN  COMPARISON:  Chest radiograph June 29, 2013  FINDINGS: There is no demonstrable pulmonary embolus. There is no thoracic aortic aneurysm or dissection.  Lungs are clear. There is no appreciable thoracic adenopathy. Pericardium is not thickened.  Visualized upper abdominal structures appear normal. No bone lesions are identified.  Review of the MIP images confirms the above findings.  IMPRESSION: Lungs clear. No demonstrable pulmonary embolus.   Electronically Signed   By: Bretta Bang   On: 06/29/2013 19:48   Ct Abdomen Pelvis W Contrast  07/01/2013   *RADIOLOGY REPORT*  Clinical Data: Abdominal pain, status post cholecystectomy on 06/29/2013  CT ABDOMEN AND PELVIS WITH CONTRAST  Technique:  Multidetector CT imaging of the abdomen and pelvis was performed following the standard protocol during bolus administration of intravenous contrast.  Contrast: 50mL OMNIPAQUE IOHEXOL 300 MG/ML  SOLN, OMNIPAQUE IOHEXOL 300 MG/ML  SOLN  Comparison: Prior MRI from 04/18/2013  Findings: Visualized lung bases are clear.  The liver demonstrates a normal contrast enhanced appearance without evidence of focal  lesion.  Postoperative changes from interval cholecystectomy are present. No fluid collection is seen within the gallbladder fossa to suggest abscess or myeloma.  No significant biliary ductal dilatation.  The spleen, adrenal glands, and pancreas are within normal limits.  4.1 x 3.1 cm left renal cyst is unchanged.  Kidneys are otherwise unremarkable.  There is no evidence of bowel obstruction.  The appendix is well visualized in the right lower quadrant and is of normal caliber and appearance without associate inflammatory changes to suggest acute appendicitis. No abnormal wall thickening enhancement is seen about the bowels.  Bladder and uterus are within normal limits.  No free air or fluid.  No pathologically enlarged intra-abdominal pelvic lymph nodes are  identified.  No acute osseous abnormality identified.  IMPRESSION: Postoperative changes from interval cholecystectomy without complication.  No CT evidence of acute intra-abdominal or pelvic process.   Original Report Authenticated By: Rise Mu, M.D.      Assessment/Plan Active Problems: 1. Pancreatitis - admit to med surg. NPO , IVF, and pain control as needed.  - HIDA scan ordered by surgery to evaluate for CBD stone .  - repeat lipase and liver enzymes in am.  - appreciate surgery recommendations.   2. Transaminitis: unclear etiology repeat liver enzymes in am.   3. Hypertension: suboptimal. Resume home meds.   4. DVT prophylaxis  Code Status: full code Family Communication: family at bedside.  Disposition Plan:pending.   Time spent: 65 min  Jerremy Maione [facility] Pager [phone]  If 7PM-7AM, please contact night-coverage [email or web address] Password [facility] 07/01/2013, 12:44 PM

## 2013-07-01 NOTE — ED Provider Notes (Addendum)
CSN: [id number]     Arrival date & time 07/01/13  0226 History   First MD Initiated Contact with Patient 07/01/13 0431     Chief Complaint  Patient presents with  . Abdominal Pain   (Consider location/radiation/quality/duration/timing/severity/associated sxs/prior Treatment) HPI This is a morbidly obese 32 year old female who underwent a laparoscopic cholecystectomy on 06/22/2013. She had been recuperating well and stopped taking any narcotics several days ago. She was seen again 2 days ago for chest discomfort and had a workup that included a negative CT angina chest. She is here with severe upper abdominal pain that began yesterday evening about 9 PM. She describes the pain as being like a band across her upper abdomen and characterized as "like the worst gallbladder attack I've ever had". She states the pain was 9-10 out of 10. It was worse with movement or palpation. She was given some Tylenol prior to my evaluation with improvement in the pain to a 6/10. The pain has been associated with nausea but no vomiting or diarrhea. Her bowel movements have been irregular since surgery.  Past Medical History  Diagnosis Date  . Hypertension   . GERD (gastroesophageal reflux disease)   . Abdominal pain     related to gallstones  . Gallstones   . Sinus congestion     occ. uses OTC meds for this  . Sleep apnea 06-15-13    Was told test nonconclusive, unable to sleep and remain on back..Stop/Bang score =4   Past Surgical History  Procedure Laterality Date  . Tonsillectomy    . Cholecystectomy N/A 06/22/2013    Procedure: LAPAROSCOPIC CHOLECYSTECTOMY ;  Surgeon: Ernestene Mention, MD;  Location: [facility];  Service: General;  Laterality: N/A;   History reviewed. No pertinent family history. History  Substance Use Topics  . Smoking status: Never Smoker   . Smokeless tobacco: Not on file  . Alcohol Use: Yes     Comment: rare   OB History   Grav Para Term Preterm Abortions TAB SAB Ect Mult Living                 Review of Systems  All other systems reviewed and are negative.    Allergies  Review of patient's allergies indicates no known allergies.  Home Medications   Current Outpatient Rx  Name  Route  Sig  Dispense  Refill  . carvedilol (COREG) 12.5 MG tablet   Oral   Take 12.5 mg by mouth 2 (two) times daily with a meal. Will only take once daily for now         . lisinopril-hydrochlorothiazide (PRINZIDE,ZESTORETIC) 20-25 MG per tablet   Oral   Take 1 tablet by mouth every morning.          . Multiple Vitamin (MULTIVITAMIN WITH MINERALS) TABS tablet   Oral   Take 1 tablet by mouth daily.         . simethicone (MYLICON) 80 MG chewable tablet   Oral   Chew 80 mg by mouth every 6 (six) hours as needed for flatulence.         . bisacodyl (BISACODYL) 5 MG EC tablet   Oral   Take 5 mg by mouth daily as needed for constipation.         . famotidine (PEPCID AC) 10 MG chewable tablet   Oral   Chew 10 mg by mouth 2 (two) times daily.         Marland Kitchen HYDROcodone-acetaminophen (NORCO/VICODIN) 5-325 MG per  tablet   Oral   Take 1 tablet by mouth every 4 (four) hours as needed for pain.          Marland Kitchen oxyCODONE-acetaminophen (PERCOCET) 5-325 MG per tablet   Oral   Take 1 tablet by mouth every 6 (six) hours as needed for pain.   17 tablet   0   . polyethylene glycol (MIRALAX / GLYCOLAX) packet   Oral   Take 17 g by mouth daily.   14 each   0    BP 150/84  Pulse 90  Temp(Src) 98.6 F (37 C) (Oral)  Resp 18  SpO2 98%  LMP 06/21/2013  Physical Exam General: Well-developed, morbidly obese female in no acute distress; appearance consistent with age of record HENT: normocephalic; atraumatic Eyes: pupils equal, round and reactive to light; extraocular muscles intact Neck: supple Heart: regular rate and rhythm; no murmurs, rubs or gallops Lungs: clear to auscultation bilaterally Abdomen: soft; nondistended; upper abdominal tenderness; multiple well-healing  laparoscopy incisions; bowel sounds decreased Extremities: No deformity; full range of motion; pulses normal Neurologic: Awake, alert and oriented; motor function intact in all extremities and symmetric; no facial droop Skin: Warm and dry Psychiatric: Normal mood and affect    ED Course  Procedures (including critical care time)    MDM   Nursing notes and vitals signs, including pulse oximetry, reviewed.  Summary of this visit's results, reviewed by myself:  Labs:  Results for orders placed during the hospital encounter of 07/01/13 (from the past 24 hour(s))  LIPASE, BLOOD     Status: Abnormal   Collection Time    07/01/13  2:54 AM      Result Value Range   Lipase 280 (*) 11 - 59 U/L  COMPREHENSIVE METABOLIC PANEL     Status: Abnormal   Collection Time    07/01/13  2:54 AM      Result Value Range   Sodium 139  135 - 145 mEq/L   Potassium 3.8  3.5 - 5.1 mEq/L   Chloride 102  96 - 112 mEq/L   CO2 28  19 - 32 mEq/L   Glucose, Bld 132 (*) 70 - 99 mg/dL   BUN 10  6 - 23 mg/dL   Creatinine, Ser 1.61  0.50 - 1.10 mg/dL   Calcium 09.6  8.4 - 04.5 mg/dL   Total Protein 7.0  6.0 - 8.3 g/dL   Albumin 3.8  3.5 - 5.2 g/dL   AST 409 (*) 0 - 37 U/L   ALT 403 (*) 0 - 35 U/L   Alkaline Phosphatase 153 (*) 39 - 117 U/L   Total Bilirubin 0.9  0.3 - 1.2 mg/dL   GFR calc non Af Amer 82 (*) >90 mL/min   GFR calc Af Amer >90  >90 mL/min  CBC WITH DIFFERENTIAL     Status: Abnormal   Collection Time    07/01/13  2:54 AM      Result Value Range   WBC 5.4  4.0 - 10.5 K/uL   RBC 4.16  3.87 - 5.11 MIL/uL   Hemoglobin 11.8 (*) 12.0 - 15.0 g/dL   HCT 81.1  91.4 - 78.2 %   MCV 87.7  78.0 - 100.0 fL   MCH 28.4  26.0 - 34.0 pg   MCHC 32.3  30.0 - 36.0 g/dL   RDW 95.6  21.3 - 08.6 %   Platelets 289  150 - 400 K/uL   Neutrophils Relative % 54  43 - 77 %  Neutro Abs 2.9  1.7 - 7.7 K/uL   Lymphocytes Relative 37  12 - 46 %   Lymphs Abs 2.0  0.7 - 4.0 K/uL   Monocytes Relative 8  3 - 12 %    Monocytes Absolute 0.4  0.1 - 1.0 K/uL   Eosinophils Relative 1  0 - 5 %   Eosinophils Absolute 0.1  0.0 - 0.7 K/uL   Basophils Relative 0  0 - 1 %   Basophils Absolute 0.0  0.0 - 0.1 K/uL  URINALYSIS, ROUTINE W REFLEX MICROSCOPIC     Status: Abnormal   Collection Time    07/01/13  6:37 AM      Result Value Range   Color, Urine YELLOW  YELLOW   APPearance CLEAR  CLEAR   Specific Gravity, Urine 1.039 (*) 1.005 - 1.030   pH 7.5  5.0 - 8.0   Glucose, UA NEGATIVE  NEGATIVE mg/dL   Hgb urine dipstick NEGATIVE  NEGATIVE   Bilirubin Urine NEGATIVE  NEGATIVE   Ketones, ur NEGATIVE  NEGATIVE mg/dL   Protein, ur NEGATIVE  NEGATIVE mg/dL   Urobilinogen, UA 1.0  0.0 - 1.0 mg/dL   Nitrite NEGATIVE  NEGATIVE   Leukocytes, UA NEGATIVE  NEGATIVE    Imaging Studies: Dg Chest 2 View  06/29/2013   CLINICAL DATA:  Chest pain and shortness of Breath  EXAM: CHEST  2 VIEW  COMPARISON:  March 31, 2013  FINDINGS: Lungs are clear. Heart size and pulmonary vascularity are normal. No adenopathy. No pneumothorax. No bone lesions.  IMPRESSION: No abnormality noted.   Electronically Signed   By: Bretta Bang   On: 06/29/2013 19:01   Ct Angio Chest Pe W/cm &/or Wo Cm  06/29/2013   CLINICAL DATA:  Chest pain and shortness of Breath  EXAM: CT ANGIOGRAPHY CHEST WITH CONTRAST  TECHNIQUE: Multidetector CT imaging of the chest was performed using the standard protocol during bolus administration of intravenous contrast. Multiplanar CT image reconstructions including MIPs were obtained to evaluate the vascular anatomy.  CONTRAST:  OMNIPAQUE IOHEXOL 350 MG/ML SOLN  COMPARISON:  Chest radiograph June 29, 2013  FINDINGS: There is no demonstrable pulmonary embolus. There is no thoracic aortic aneurysm or dissection.  Lungs are clear. There is no appreciable thoracic adenopathy. Pericardium is not thickened.  Visualized upper abdominal structures appear normal. No bone lesions are identified.  Review of the MIP  images confirms the above findings.  IMPRESSION: Lungs clear. No demonstrable pulmonary embolus.   Electronically Signed   By: Bretta Bang   On: 06/29/2013 19:48   Ct Abdomen Pelvis W Contrast  07/01/2013   *RADIOLOGY REPORT*  Clinical Data: Abdominal pain, status post cholecystectomy on 06/29/2013  CT ABDOMEN AND PELVIS WITH CONTRAST  Technique:  Multidetector CT imaging of the abdomen and pelvis was performed following the standard protocol during bolus administration of intravenous contrast.  Contrast: 50mL OMNIPAQUE IOHEXOL 300 MG/ML  SOLN, OMNIPAQUE IOHEXOL 300 MG/ML  SOLN  Comparison: Prior MRI from 04/18/2013  Findings: Visualized lung bases are clear.  The liver demonstrates a normal contrast enhanced appearance without evidence of focal lesion.  Postoperative changes from interval cholecystectomy are present. No fluid collection is seen within the gallbladder fossa to suggest abscess or myeloma.  No significant biliary ductal dilatation.  The spleen, adrenal glands, and pancreas are within normal limits.  4.1 x 3.1 cm left renal cyst is unchanged.  Kidneys are otherwise unremarkable.  There is no evidence of bowel obstruction.  The appendix is well visualized in the right lower quadrant and is of normal caliber and appearance without associate inflammatory changes to suggest acute appendicitis. No abnormal wall thickening enhancement is seen about the bowels.  Bladder and uterus are within normal limits.  No free air or fluid.  No pathologically enlarged intra-abdominal pelvic lymph nodes are identified.  No acute osseous abnormality identified.  IMPRESSION: Postoperative changes from interval cholecystectomy without complication.  No CT evidence of acute intra-abdominal or pelvic process.   Original Report Authenticated By: Rise Mu, M.D.   6:46 AM Dr. Ovidio Kin accepts patient for transfer to [facility] ED. Dr. Norlene Campbell made aware, as patient may need to be admitted medically  (hospitalist service) if no surgical issues are identified.     Hanley Seamen, MD 07/01/13 2952  Hanley Seamen, MD 07/01/13 [phone]

## 2013-07-01 NOTE — ED Notes (Signed)
Bed: WA20 Expected date:  Expected time:  Means of arrival:  Comments: 

## 2013-07-01 NOTE — Consult Note (Signed)
Seen, examined, agree with above.  HIDA negative. Will let pancreatitis improve.  May need MRCP in open MRI.  Will see how she does.

## 2013-07-01 NOTE — ED Notes (Signed)
Bed: ZO10 Expected date: 07/01/13 Expected time: 6:49 AM Means of arrival: Ambulance Comments: Pancreatitis from [facility]

## 2013-07-02 DIAGNOSIS — K859 Acute pancreatitis without necrosis or infection, unspecified: Secondary | ICD-10-CM

## 2013-07-02 LAB — COMPREHENSIVE METABOLIC PANEL
ALT: 526 U/L — ABNORMAL HIGH (ref 0–35)
Alkaline Phosphatase: 190 U/L — ABNORMAL HIGH (ref 39–117)
BUN: 7 mg/dL (ref 6–23)
CO2: 24 mEq/L (ref 19–32)
Calcium: 9.4 mg/dL (ref 8.4–10.5)
GFR calc Af Amer: >90 mL/min (ref >90)
GFR calc non Af Amer: 86 mL/min — ABNORMAL LOW (ref >90)
Glucose, Bld: 195 mg/dL — ABNORMAL HIGH (ref 70–99)
Sodium: 137 mEq/L (ref 135–145)
Total Protein: 6.8 g/dL (ref 6.0–8.3)

## 2013-07-02 LAB — LIPASE, BLOOD: Lipase: >3000 U/L — ABNORMAL HIGH (ref 11–59)

## 2013-07-02 LAB — CBC
HCT: 37.1 % (ref 36.0–46.0)
Hemoglobin: 11.9 g/dL — ABNORMAL LOW (ref 12.0–15.0)
MCHC: 32.1 g/dL (ref 30.0–36.0)
RDW: 13.5 % (ref 11.5–15.5)
WBC: 13.8 10*3/uL — ABNORMAL HIGH (ref 4.0–10.5)

## 2013-07-02 MED ORDER — HYDROMORPHONE HCL PF 1 MG/ML IJ SOLN
1.0000 mg | INTRAMUSCULAR | Status: DC | PRN
  Administered 2013-07-02 (×2): 1 mg via INTRAVENOUS
  Filled 2013-07-02 (×2): qty 1

## 2013-07-02 MED ORDER — HYDROMORPHONE HCL PF 1 MG/ML IJ SOLN
0.5000 mg | Freq: Once | INTRAMUSCULAR | Status: AC
  Administered 2013-07-02: 0.5 mg via INTRAVENOUS

## 2013-07-02 MED ORDER — PROMETHAZINE HCL 25 MG/ML IJ SOLN
12.5000 mg | Freq: Once | INTRAMUSCULAR | Status: AC
  Administered 2013-07-02: 12.5 mg via INTRAVENOUS
  Filled 2013-07-02: qty 1

## 2013-07-02 NOTE — Progress Notes (Signed)
Subjective: Had a severe episode on pain with n/v last night  Objective: Vital signs in last 24 hours: Temp:  [98.5 F (36.9 C)-98.7 F (37.1 C)] 98.6 F (37 C) (09/20 0530) Pulse Rate:  [77-91] 90 (09/20 0530) Resp:  [16] 16 (09/20 0530) BP: (110-146)/(65-84) 110/65 mmHg (09/20 0530) SpO2:  [99 %-100 %] 100 % (09/20 0530) Last BM Date: 06/30/13  Intake/Output from previous day: 09/19 0701 - 09/20 0700 In: 3400 [I.V.:3350; IV Piggyback:50] Out: 300 [Emesis/NG output:300] Intake/Output this shift:    PE: General- In NAD Abdomen-soft, obese, mild epigastric tenderness, wounds clean and intact  Lab Results:   Recent Labs  07/01/13 0254 07/02/13 0433  WBC 5.4 13.8*  HGB 11.8* 11.9*  HCT 36.5 37.1  PLT 289 306   BMET  Recent Labs  07/01/13 0254 07/02/13 0433  NA 139 137  K 3.8 3.5  CL 102 104  CO2 28 24  GLUCOSE 132* 195*  BUN 10 7  CREATININE 0.90 0.87  CALCIUM 10.0 9.4   PT/INR No results found for this basename: LABPROT, INR,  in the last 72 hours Comprehensive Metabolic Panel:    Component Value Date/Time   NA 137 07/02/2013 0433   K 3.5 07/02/2013 0433   CL 104 07/02/2013 0433   CO2 24 07/02/2013 0433   BUN 7 07/02/2013 0433   CREATININE 0.87 07/02/2013 0433   CREATININE 1.10 04/13/2013 1748   GLUCOSE 195* 07/02/2013 0433   CALCIUM 9.4 07/02/2013 0433   AST 395* 07/02/2013 0433   ALT 526* 07/02/2013 0433   ALKPHOS 190* 07/02/2013 0433   BILITOT 3.3* 07/02/2013 0433   PROT 6.8 07/02/2013 0433   ALBUMIN 3.6 07/02/2013 0433     Studies/Results: Nm Hepatobiliary  07/01/2013   CLINICAL DATA:  Right upper quadrant pain after cholecystectomy. Question bile leak. Normal bilirubin.  EXAM: NUCLEAR MEDICINE HEPATOBILIARY IMAGING  TECHNIQUE: Sequential images of the abdomen were obtained out to 60 minutes following intravenous administration of radiopharmaceutical.  COMPARISON:  CT abdomen and pelvis 07/01/2013.  RADIOPHARMACEUTICALS:  5. Tc-8m Choletec   FINDINGS: The liver demonstrates diffuse radiotracer uptake but no excretion of radiotracer into the common bile duct or bowel is identified. No evidence of biliary leak is seen.  IMPRESSION: Negative for bile leak.  No radiotracer is secreted into bowel likely due to hepatic dysfunction. Bile duct obstruction is possible but less likely given normal bilirubin.   Electronically Signed   By: Drusilla Kanner M.D.   On: 07/01/2013 15:24   Ct Abdomen Pelvis W Contrast  07/01/2013   *RADIOLOGY REPORT*  Clinical Data: Abdominal pain, status post cholecystectomy on 06/29/2013  CT ABDOMEN AND PELVIS WITH CONTRAST  Technique:  Multidetector CT imaging of the abdomen and pelvis was performed following the standard protocol during bolus administration of intravenous contrast.  Contrast: 50mL OMNIPAQUE IOHEXOL 300 MG/ML  SOLN, OMNIPAQUE IOHEXOL 300 MG/ML  SOLN  Comparison: Prior MRI from 04/18/2013  Findings: Visualized lung bases are clear.  The liver demonstrates a normal contrast enhanced appearance without evidence of focal lesion.  Postoperative changes from interval cholecystectomy are present. No fluid collection is seen within the gallbladder fossa to suggest abscess or myeloma.  No significant biliary ductal dilatation.  The spleen, adrenal glands, and pancreas are within normal limits.  4.1 x 3.1 cm left renal cyst is unchanged.  Kidneys are otherwise unremarkable.  There is no evidence of bowel obstruction.  The appendix is well visualized in the right lower quadrant and is  of normal caliber and appearance without associate inflammatory changes to suggest acute appendicitis. No abnormal wall thickening enhancement is seen about the bowels.  Bladder and uterus are within normal limits.  No free air or fluid.  No pathologically enlarged intra-abdominal pelvic lymph nodes are identified.  No acute osseous abnormality identified.  IMPRESSION: Postoperative changes from interval cholecystectomy without  complication.  No CT evidence of acute intra-abdominal or pelvic process.   Original Report Authenticated By: Rise Mu, M.D.    Anti-infectives: Anti-infectives   None      Assessment Principal Problem:   Acute pancreatitis-lipase still >3000, transaminase down, bilirubin and alk phos up; suspicious for retained CBD stone despite preop MRCP noting no CBD stones.     LOS: 1 day   Plan: Continue bowel rest and hydration.   Paris Chir[place] J 07/02/2013

## 2013-07-02 NOTE — Progress Notes (Addendum)
[facility] PROGRESS NOTE  Angel Dawson WUJ:[id number] DOB: 10/19/1980 DOA: 07/01/2013 PCP: [name], MD  Brief narrative: 32 year old female with past medical history significant for morbid obesity and hypertension, recent laparoscopic cholecystectomy on 06/22/2013 who presented to [facility] ED with worsening epigastric pain associated with nausea and vomiting started one day prior to this admission. Patient also complained of chest pain and shortness of breath on the admission. In ED, vital signs are stable with blood pressure 110/65, heart rate 73 and T 98.5 F, O2 saturation was 98% on room air. CBC revealed leukocytosis of 10.9 but the rest of the blood work is essentially unremarkable. Patient received fentanyl and morphine in ED but pain is still 10 out of 10 in intensity. Further studies included chest x-ray which was normal. CT angiogram did not reveal pulmonary embolism. CT abdomen revealed postoperative changes from interval cholecystectomy without complication. No acute intra-abdominal findings. HIDA scan was negative for bile leak but there is a possible bile duct obstruction. Lipase level was 280 on admission and repeat level more than 3000, ALP 190, AST 395 and ALT 526, total bilirubin 3.3.  Assessment/Plan:  Principal problem: Epigastric pain - Perhaps related to acute pancreatitis - Continue supportive care with IV fluids and antiemetics - Will increased Dilaudid to 1 mg every 3 hours as needed for severe pain - Appreciate surgery following - Followup lipase and LFTs in a.m.  Active problem: Hypertension - Continue Coreg and Lisinopril/Hctz  Code Status: Full code Family Communication: Family not at the bedside Disposition Plan: Home when stable  [name], MD  [facility] Pager [phone]  If 7PM-7AM, please contact night-coverage [email or web address] Password [email or web address] 07/02/2013, 6:50 AM   LOS: 1 day   Consultants:  Surgery   Procedures:  None    Antibiotics:  None  HPI/Subjective: Complaints of severe pain in the abdomen.  Objective: Filed Vitals:   07/01/13 1012 07/01/13 1317 07/01/13 2111 07/02/13 0530  BP: 142/84 146/84 130/75 110/65  Pulse: 73 77 91 90  Temp: 98.6 F (37 C) 98.5 F (36.9 C) 98.7 F (37.1 C) 98.6 F (37 C)  TempSrc: Oral Oral Oral Oral  Resp: 20 16 16 16   Height: 5\' 11"  (1.803 m)     Weight: 173.728 kg (383 lb)     SpO2: 100% 99% 99% 100%    Intake/Output Summary (Last 24 hours) at 07/02/13 0650 Last data filed at 07/02/13 0400  Gross per 24 hour  Intake   2200 ml  Output    150 ml  Net   2050 ml    Exam:   General:  Pt is alert, follows commands appropriately, not in acute distress  Cardiovascular: Regular rate and rhythm, S1/S2, no murmurs, no rubs, no gallops  Respiratory: Clear to auscultation bilaterally, no wheezing, no crackles, no rhonchi  Abdomen: Soft, tender in the mid abdomen with voluntary guarding, no rebound  Extremities: No edema, pulses DP and PT palpable bilaterally  Neuro: Grossly nonfocal  Data Reviewed: Basic Metabolic Panel:  Recent Labs Lab 06/29/13 1755 07/01/13 0254 07/02/13 0433  NA 137 139 137  K 3.7 3.8 3.5  CL 98 102 104  CO2 26 28 24   GLUCOSE 163* 132* 195*  BUN 10 10 7   CREATININE 0.88 0.90 0.87  CALCIUM 10.1 10.0 9.4   Liver Function Tests:  Recent Labs Lab 07/01/13 0254 07/02/13 0433  AST 686* 395*  ALT 403* 526*  ALKPHOS 153* 190*  BILITOT 0.9 3.3*  PROT 7.0 6.8  ALBUMIN 3.8 3.6    Recent Labs Lab 07/01/13 0254 07/02/13 0433  LIPASE 280* >3000*   No results found for this basename: AMMONIA,  in the last 168 hours CBC:  Recent Labs Lab 06/29/13 1755 07/01/13 0254 07/02/13 0433  WBC 10.9* 5.4 13.8*  NEUTROABS  --  2.9  --   HGB 12.7 11.8* 11.9*  HCT 38.4 36.5 37.1  MCV 86.1 87.7 86.7  PLT 364 289 306   Cardiac Enzymes:  Recent Labs Lab 06/29/13 1755  TROPONINI <0.30   BNP: No components found with  this basename: POCBNP,  CBG: No results found for this basename: GLUCAP,  in the last 168 hours  No results found for this or any previous visit (from the past 240 hour(s)).   Studies: Nm Hepatobiliary 07/01/2013    IMPRESSION: Negative for bile leak.  No radiotracer is secreted into bowel likely due to hepatic dysfunction. Bile duct obstruction is possible but less likely given normal bilirubin.   Electronically Signed   By: Drusilla Kanner M.D.   On: 07/01/2013 15:24   Ct Abdomen Pelvis W Contrast 07/01/2013   * IMPRESSION: Postoperative changes from interval cholecystectomy without complication.  No CT evidence of acute intra-abdominal or pelvic process.   Original Report Authenticated By: Rise Mu, M.D.    Scheduled Meds: . carvedilol  12.5 mg Oral BID WC  . enoxaparin (LOVENOX)   85 mg Subcutaneous Q24H  . famotidine (PEPCID) IV  20 mg Intravenous Q24H  . lisinopril  20 mg Oral Daily   And  . hydrochlorothiazide  25 mg Oral Daily   Continuous Infusions: . sodium chloride 150 mL/hr at 07/02/13 [phone]

## 2013-07-03 LAB — COMPREHENSIVE METABOLIC PANEL
ALT: 343 U/L — ABNORMAL HIGH (ref 0–35)
AST: 158 U/L — ABNORMAL HIGH (ref 0–37)
Albumin: 3.2 g/dL — ABNORMAL LOW (ref 3.5–5.2)
Calcium: 9.4 mg/dL (ref 8.4–10.5)
Creatinine, Ser: 0.83 mg/dL (ref 0.50–1.10)
Sodium: 138 mEq/L (ref 135–145)

## 2013-07-03 LAB — LIPASE, BLOOD: Lipase: 637 U/L — ABNORMAL HIGH (ref 11–59)

## 2013-07-03 MED ORDER — METOPROLOL TARTRATE 1 MG/ML IV SOLN
5.0000 mg | Freq: Four times a day (QID) | INTRAVENOUS | Status: DC | PRN
  Filled 2013-07-03: qty 5

## 2013-07-03 MED ORDER — PROMETHAZINE HCL 25 MG/ML IJ SOLN
12.5000 mg | Freq: Four times a day (QID) | INTRAMUSCULAR | Status: DC | PRN
  Administered 2013-07-03: 25 mg via INTRAVENOUS
  Filled 2013-07-03: qty 1

## 2013-07-03 MED ORDER — KCL IN DEXTROSE-NACL 40-5-0.45 MEQ/L-%-% IV SOLN
INTRAVENOUS | Status: DC
  Administered 2013-07-03 – 2013-07-04 (×3): via INTRAVENOUS
  Filled 2013-07-03 (×4): qty 1000

## 2013-07-03 MED ORDER — ZOLPIDEM TARTRATE 5 MG PO TABS: 5.0000 mg | ORAL_TABLET | Freq: Every evening | ORAL | Status: DC | PRN

## 2013-07-03 MED ORDER — DIPHENHYDRAMINE HCL 50 MG/ML IJ SOLN: 12.5000 mg | Freq: Four times a day (QID) | INTRAMUSCULAR | Status: DC | PRN

## 2013-07-03 MED ORDER — HYDROMORPHONE HCL PF 1 MG/ML IJ SOLN
0.5000 mg | INTRAMUSCULAR | Status: DC | PRN
  Administered 2013-07-03 – 2013-07-04 (×2): 1 mg via INTRAVENOUS
  Filled 2013-07-03 (×2): qty 1

## 2013-07-03 NOTE — Progress Notes (Signed)
Angel Dawson [id number] 04-03-1981  CARE TEAM:  PCP: [name], MD  Outpatient Care Team: Patient Care Team: [name] as PCP - General (Family Medicine)  Inpatient Treatment Team: Treatment Team: Attending Provider: Alison Murray, MD; Consulting Physician: Bishop Limbo, MD; Registered Nurse: Eugene Garnet, RN; Registered Nurse: Roni Bread, RN; Rounding Team: Alton Revere, MD; Technician: Gwenlyn Perking, [place]; Registered Nurse: Gloriajean Dell, RN   Subjective:  Sitting in chair, using phone Feeling better  Objective:  Vital signs:  Filed Vitals:   07/02/13 0530 07/02/13 1428 07/02/13 2132 07/03/13 0457  BP: 110/65 121/76 130/76 112/59  Pulse: 90 82 86 83  Temp: 98.6 F (37 C) 98.3 F (36.8 C) 98.9 F (37.2 C) 99.2 F (37.3 C)  TempSrc: Oral Oral Oral Oral  Resp: 16 18 18 18   Height:      Weight:      SpO2: 100% 99% 100% 100%    Last BM Date: 07/02/13  Intake/Output   Yesterday:  09/20 0701 - 09/21 0700 In: 3522.5 [I.V.:3522.5] Out: 100 [Emesis/NG output:100] This shift:     Bowel function:  Flatus: y  BM: n  Drain: n/a  Physical Exam:  General: Pt awake/alert/oriented x4 in no acute distress Eyes: PERRL, normal EOM.  Sclera clear.  No icterus Neuro: CN II-XII intact w/o focal sensory/motor deficits. Lymph: No head/neck/groin lymphadenopathy Psych:  No delerium/psychosis/paranoia HENT: Normocephalic, Mucus membranes moist.  No thrush Neck: Supple, No tracheal deviation Chest: No chest wall pain w good excursion CV:  Pulses intact.  Regular rhythm MS: Normal AROM mjr joints.  No obvious deformity Abdomen: Soft.  Morbidly obese.  Nondistended.  Mildly tender epigastric.  No evidence of peritonitis.  No incarcerated hernias. Ext:  SCDs BLE.  No mjr edema.  No cyanosis Skin: No petechiae / purpura   Problem List:   Principal Problem:   Pancreatitis, acute Active Problems:   Gallstones   Severe obesity (BMI >= 40)  Essential hypertension, benign   Abdominal pain, acute, epigastric   Assessment  Angel Dawson  32 y.o. female       Improving  Plan:  -Liquids only -f/u pancreatits - lower #s hopeful sign -if worse - MRCP W GI eval r/o CBD stone -VTE prophylaxis- SCDs, etc -mobilize as tolerated to help recovery  Ardeth Sportsman, M.D., F.A.C.S. Gastrointestinal and Minimally Invasive Surgery [facility] [address], [address] [place], [place] [address] [phone] Main / Paging   07/03/2013   Results:   Labs: Results for orders placed during the hospital encounter of 07/01/13 (from the past 48 hour(s))  COMPREHENSIVE METABOLIC PANEL     Status: Abnormal   Collection Time    07/02/13  4:33 AM      Result Value Range   Sodium 137  135 - 145 mEq/L   Potassium 3.5  3.5 - 5.1 mEq/L   Chloride 104  96 - 112 mEq/L   CO2 24  19 - 32 mEq/L   Glucose, Bld 195 (*) 70 - 99 mg/dL   BUN 7  6 - 23 mg/dL   Creatinine, Ser 4.01  0.50 - 1.10 mg/dL   Calcium 9.4  8.4 - 02.7 mg/dL   Total Protein 6.8  6.0 - 8.3 g/dL   Albumin 3.6  3.5 - 5.2 g/dL   AST 253 (*) 0 - 37 U/L   ALT 526 (*) 0 - 35 U/L   Alkaline Phosphatase 190 (*) 39 - 117  U/L   Total Bilirubin 3.3 (*) 0.3 - 1.2 mg/dL   GFR calc non Af Amer 86 (*) >90 mL/min   GFR calc Af Amer >90  >90 mL/min   Comment: (NOTE)     The eGFR has been calculated using the CKD EPI equation.     This calculation has not been validated in all clinical situations.     eGFR's persistently <90 mL/min signify possible Chronic Kidney     Disease.  CBC     Status: Abnormal   Collection Time    07/02/13  4:33 AM      Result Value Range   WBC 13.8 (*) 4.0 - 10.5 K/uL   RBC 4.28  3.87 - 5.11 MIL/uL   Hemoglobin 11.9 (*) 12.0 - 15.0 g/dL   HCT 16.1  09.6 - 04.5 %   MCV 86.7  78.0 - 100.0 fL   MCH 27.8  26.0 - 34.0 pg   MCHC 32.1  30.0 - 36.0 g/dL   RDW 40.9  81.1 - 91.4 %   Platelets 306  150 - 400 K/uL  LIPASE, BLOOD      Status: Abnormal   Collection Time    07/02/13  4:33 AM      Result Value Range   Lipase >3000 (*) 11 - 59 U/L  LIPASE, BLOOD     Status: Abnormal   Collection Time    07/03/13  4:20 AM      Result Value Range   Lipase 637 (*) 11 - 59 U/L  COMPREHENSIVE METABOLIC PANEL     Status: Abnormal   Collection Time    07/03/13  4:20 AM      Result Value Range   Sodium 138  135 - 145 mEq/L   Potassium 2.9 (*) 3.5 - 5.1 mEq/L   Comment: DELTA CHECK NOTED   Chloride 105  96 - 112 mEq/L   CO2 25  19 - 32 mEq/L   Glucose, Bld 92  70 - 99 mg/dL   BUN 8  6 - 23 mg/dL   Creatinine, Ser 7.82  0.50 - 1.10 mg/dL   Calcium 9.4  8.4 - 95.6 mg/dL   Total Protein 6.2  6.0 - 8.3 g/dL   Albumin 3.2 (*) 3.5 - 5.2 g/dL   AST 213 (*) 0 - 37 U/L   ALT 343 (*) 0 - 35 U/L   Alkaline Phosphatase 161 (*) 39 - 117 U/L   Total Bilirubin 0.8  0.3 - 1.2 mg/dL   GFR calc non Af Amer >90  >90 mL/min   GFR calc Af Amer >90  >90 mL/min   Comment: (NOTE)     The eGFR has been calculated using the CKD EPI equation.     This calculation has not been validated in all clinical situations.     eGFR's persistently <90 mL/min signify possible Chronic Kidney     Disease.    Imaging / Studies: Nm Hepatobiliary  07/01/2013   CLINICAL DATA:  Right upper quadrant pain after cholecystectomy. Question bile leak. Normal bilirubin.  EXAM: NUCLEAR MEDICINE HEPATOBILIARY IMAGING  TECHNIQUE: Sequential images of the abdomen were obtained out to 60 minutes following intravenous administration of radiopharmaceutical.  COMPARISON:  CT abdomen and pelvis 07/01/2013.  RADIOPHARMACEUTICALS:  5. Tc-5m Choletec  FINDINGS: The liver demonstrates diffuse radiotracer uptake but no excretion of radiotracer into the common bile duct or bowel is identified. No evidence of biliary leak is seen.  IMPRESSION: Negative for bile leak.  No radiotracer is secreted into bowel likely due to hepatic dysfunction. Bile duct obstruction is possible but less  likely given normal bilirubin.   Electronically Signed   By: Drusilla Kanner M.D.   On: 07/01/2013 15:24    Medications / Allergies: per chart  Antibiotics: Anti-infectives   None

## 2013-07-03 NOTE — Progress Notes (Addendum)
[facility] PROGRESS NOTE  Angel Dawson UJW:[id number] DOB: [birth year] DOA: 07/01/2013 PCP: [name], MD  Brief narrative: 32 year old female with past medical history significant for morbid obesity and hypertension, recent laparoscopic cholecystectomy on 06/22/2013 who presented to [facility] ED with worsening epigastric pain associated with nausea and vomiting started one day prior to this admission. Patient also complained of chest pain and shortness of breath on the admission.  In ED, vital signs are stable with blood pressure 110/65, heart rate 73 and T 98.5 F, O2 saturation was 98% on room air. CBC revealed leukocytosis of 10.9 but the rest of the blood work is essentially unremarkable. Patient received fentanyl and morphine in ED but pain is still 10 out of 10 in intensity. Further studies included chest x-ray which was normal. CT angiogram did not reveal pulmonary embolism. CT abdomen revealed postoperative changes from interval cholecystectomy without complication. No acute intra-abdominal findings. HIDA scan was negative for bile leak but there is a possible bile duct obstruction. Lipase level was 280 on admission and repeat level more than 3000, ALP 190, AST 395 and ALT 526, total bilirubin 3.3.    Assessment/Plan:   Principal problem:  Epigastric pain  - Secondary to acute pancreatitis  - Continue IV fluids, antiemetics and analgesia - lipase trending down from more than 3000 to around 700 this am; LFT's trending down - Appreciate surgery following  - Followup lipase in am - advance diet to full liquid Acute pancreatitis - management as above Active problem:  Hypertension  - Continue Coreg and Lisinopril/Hctz    Code Status: Full code  Family Communication: Family updated at the bedside  Disposition Plan: Home when stable   [name], MD  [facility]  Pager [phone]   Consultants:  Surgery  Procedures:  None  Antibiotics:  None   If 7PM-7AM, please  contact night-coverage [email or web address] Password [facility] 07/03/2013, 2:25 PM   LOS: 2 days   HPI/Subjective: Feels better this am.  Objective: Filed Vitals:   07/02/13 0530 07/02/13 1428 07/02/13 2132 07/03/13 0457  BP: 110/65 121/76 130/76 112/59  Pulse: 90 82 86 83  Temp: 98.6 F (37 C) 98.3 F (36.8 C) 98.9 F (37.2 C) 99.2 F (37.3 C)  TempSrc: Oral Oral Oral Oral  Resp: 16 18 18 18   Height:      Weight:      SpO2: 100% 99% 100% 100%    Intake/Output Summary (Last 24 hours) at 07/03/13 1425 Last data filed at 07/03/13 0900  Gross per 24 hour  Intake 3762.5 ml  Output    250 ml  Net 3512.5 ml    Exam:   General:  Pt is alert, follows commands appropriately, not in acute distress  Cardiovascular: Regular rate and rhythm, S1/S2, no murmurs, no rubs, no gallops  Respiratory: Clear to auscultation bilaterally, no wheezing, no crackles, no rhonchi  Abdomen: Soft, non tender, non distended, bowel sounds present, no guarding  Extremities: No edema, pulses DP and PT palpable bilaterally  Neuro: Grossly nonfocal  Data Reviewed: Basic Metabolic Panel:  Recent Labs Lab 06/29/13 1755 07/01/13 0254 07/02/13 0433 07/03/13 0420  NA 137 139 137 138  K 3.7 3.8 3.5 2.9*  CL 98 102 104 105  CO2 26 28 24 25   GLUCOSE 163* 132* 195* 92  BUN 10 10 7 8   CREATININE 0.88 0.90 0.87 0.83  CALCIUM 10.1 10.0 9.4 9.4   Liver Function Tests:  Recent Labs Lab 07/01/13 0254 07/02/13 0433 07/03/13 0420  AST 686* 395* 158*  ALT 403* 526* 343*  ALKPHOS 153* 190* 161*  BILITOT 0.9 3.3* 0.8  PROT 7.0 6.8 6.2  ALBUMIN 3.8 3.6 3.2*    Recent Labs Lab 07/01/13 0254 07/02/13 0433 07/03/13 0420  LIPASE 280* >3000* 637*   No results found for this basename: AMMONIA,  in the last 168 hours CBC:  Recent Labs Lab 06/29/13 1755 07/01/13 0254 07/02/13 0433  WBC 10.9* 5.4 13.8*  NEUTROABS  --  2.9  --   HGB 12.7 11.8* 11.9*  HCT 38.4 36.5 37.1  MCV 86.1 87.7 86.7  PLT  364 289 306   Cardiac Enzymes:  Recent Labs Lab 06/29/13 1755  TROPONINI <0.30   BNP: No components found with this basename: POCBNP,  CBG: No results found for this basename: GLUCAP,  in the last 168 hours  No results found for this or any previous visit (from the past 240 hour(s)).   Studies: Nm Hepatobiliary  07/01/2013   CLINICAL DATA:  Right upper quadrant pain after cholecystectomy. Question bile leak. Normal bilirubin.  EXAM: NUCLEAR MEDICINE HEPATOBILIARY IMAGING  TECHNIQUE: Sequential images of the abdomen were obtained out to 60 minutes following intravenous administration of radiopharmaceutical.  COMPARISON:  CT abdomen and pelvis 07/01/2013.  RADIOPHARMACEUTICALS:  5. Tc-72m Choletec  FINDINGS: The liver demonstrates diffuse radiotracer uptake but no excretion of radiotracer into the common bile duct or bowel is identified. No evidence of biliary leak is seen.  IMPRESSION: Negative for bile leak.  No radiotracer is secreted into bowel likely due to hepatic dysfunction. Bile duct obstruction is possible but less likely given normal bilirubin.   Electronically Signed   By: Drusilla Kanner M.D.   On: 07/01/2013 15:24    Scheduled Meds: . carvedilol  12.5 mg Oral BID WC  . enoxaparin (LOVENOX) injection  85 mg Subcutaneous Q24H  . famotidine (PEPCID) IV  20 mg Intravenous Q24H  . lisinopril  20 mg Oral Daily   And  . hydrochlorothiazide  25 mg Oral Daily   Continuous Infusions: . dextrose 5 % and 0.45 % NaCl with KCl 40 mEq/L 100 mL/hr at 07/03/13 1104

## 2013-07-04 ENCOUNTER — Encounter: Payer: Self-pay | Admitting: Gastroenterology

## 2013-07-04 LAB — BASIC METABOLIC PANEL
BUN: 5 mg/dL — ABNORMAL LOW (ref 6–23)
Creatinine, Ser: 0.8 mg/dL (ref 0.50–1.10)
GFR calc Af Amer: >90 mL/min (ref >90)
GFR calc non Af Amer: >90 mL/min (ref >90)
Glucose, Bld: 146 mg/dL — ABNORMAL HIGH (ref 70–99)
Potassium: 3.4 mEq/L — ABNORMAL LOW (ref 3.5–5.1)

## 2013-07-04 LAB — HEPATIC FUNCTION PANEL
AST: 336 U/L — ABNORMAL HIGH (ref 0–37)
Albumin: 3.3 g/dL — ABNORMAL LOW (ref 3.5–5.2)
Indirect Bilirubin: 0.5 mg/dL (ref 0.3–0.9)
Total Bilirubin: 1.6 mg/dL — ABNORMAL HIGH (ref 0.3–1.2)
Total Protein: 6.4 g/dL (ref 6.0–8.3)

## 2013-07-04 MED ORDER — INFLUENZA VAC SPLIT QUAD 0.5 ML IM SUSP
0.5000 mL | Freq: Once | INTRAMUSCULAR | Status: AC
  Administered 2013-07-04: 0.5 mL via INTRAMUSCULAR
  Filled 2013-07-04: qty 0.5

## 2013-07-04 MED ORDER — HYDROCODONE-ACETAMINOPHEN 10-325 MG PO TABS: 1.0000 | ORAL_TABLET | Freq: Four times a day (QID) | ORAL | Status: DC | PRN

## 2013-07-04 MED ORDER — ONDANSETRON HCL 4 MG PO TABS: 4.0000 mg | ORAL_TABLET | Freq: Three times a day (TID) | ORAL | Status: DC | PRN

## 2013-07-04 MED ORDER — POTASSIUM CHLORIDE CRYS ER 20 MEQ PO TBCR
40.0000 meq | EXTENDED_RELEASE_TABLET | Freq: Once | ORAL | Status: AC
  Administered 2013-07-04: 40 meq via ORAL
  Filled 2013-07-04: qty 2

## 2013-07-04 NOTE — Progress Notes (Signed)
Patient stable at time of discharge. IV removed. Reviewed discharge education with patient and her mother. They verbally clarified that they understood the discharge teaching. Pt left with belongings and prescriptions.

## 2013-07-04 NOTE — Progress Notes (Signed)
  Subjective: She says she feels better ann got a low fat diet this AM. Objective: Vital signs in last 24 hours: Temp:  [98.5 F (36.9 C)-98.6 F (37 C)] 98.5 F (36.9 C) (09/22 0515) Pulse Rate:  [70-74] 70 (09/22 0515) Resp:  [16-20] 18 (09/22 0515) BP: (119-137)/(71-85) 119/73 mmHg (09/22 0515) SpO2:  [97 %-100 %] 98 % (09/22 0515) Weight:  [177.447 kg (391 lb 3.2 oz)] 177.447 kg (391 lb 3.2 oz) (09/21 1430) Last BM Date: 07/03/13 600 PO recorded,  1 BM Afebrile, VSS Lipase and LFT's better Intake/Output from  previous day: 09/21 0701 - 09/22 0700 In: 3184 [P.O.:600; I.V.:2584] Out: 1300 [Urine:1300] Intake/Output this shift:    General appearance: alert, cooperative and no distress GI: soft, still a little tender in the upper abdomen mid and right sides, she says it's different than her GB surgery pain.  Much better now.  Lab Results:   Recent Labs  07/02/13 0433  WBC 13.8*  HGB 11.9*  HCT 37.1  PLT 306    BMET  Recent Labs  07/02/13 0433 07/03/13 0420  NA 137 138  K 3.5 2.9*  CL 104 105  CO2 24 25  GLUCOSE 195* 92  BUN 7 8  CREATININE 0.87 0.83  CALCIUM 9.4 9.4   PT/INR No results found for this basename: LABPROT, INR,  in the last 72 hours   Recent Labs Lab 07/01/13 0254 07/02/13 0433 07/03/13 0420 07/04/13 0410  AST 686* 395* 158* 336*  ALT 403* 526* 343* 372*  ALKPHOS 153* 190* 161* 172*  BILITOT 0.9 3.3* 0.8 1.6*  PROT 7.0 6.8 6.2 6.4  ALBUMIN 3.8 3.6 3.2* 3.3*     Lipase     Component Value Date/Time   LIPASE 69* 07/04/2013 0410     Studies/Results: No results found.  Medications: . carvedilol  12.5 mg Oral BID WC  . enoxaparin (LOVENOX) injection  85 mg Subcutaneous Q24H  . famotidine (PEPCID) IV  20 mg Intravenous Q24H  . lisinopril  20 mg Oral Daily   And  . hydrochlorothiazide  25 mg Oral Daily    Assessment/Plan Acute Pancreatitis, s/p cholecystectomy 06/22/13 Dr. Derrell Lolling Lipase>3000, LFT's also up, but  improving Last K+ 2.9 HIDA scan negative on admit. Hypertension GERD Sleep apnea   Plan:  She is obviously better.  I will check on getting MRI.     LOS: 3 days    Angel Dawson 07/04/2013

## 2013-07-04 NOTE — Care Management Note (Signed)
Cm spoke with patient at the bedside with mother present concerning discharge planning. Per pt anticipates follow up with PCP within 2 weeks. Pt states has a follow up with GI Dr.Peter, [facility] on 07/06/13. Pt uses [facility] on [address] in [place]. Pt request copy of medical records. Pt provided with medical record release form at bedside. Pt instructed to complete form and return to medical records prior to 4pm if pt wants records same day.   Angel Manns Sakiya Stepka,RN,MSN [phone]

## 2013-07-04 NOTE — Discharge Summary (Signed)
Physician Discharge Summary  Angel Dawson WUJ:[id number] DOB: 20-Jul-1981 DOA: 07/01/2013  PCP: [name], MD  Admit date: 07/01/2013 Discharge date: 07/04/2013  Recommendations for Outpatient Follow-up:  1. You have been hospitalized for acute pancreatitis. Your lipase level on admission was more than 3000 and now 65. Please advance her diet very slowly. Please avoid fried fatty foods.  2. On your next appointment with primary care physician please have them recheck her lipase level and liver function tests.  3. Use Norco as needed for pain relief  4. You may continue taking your blood pressure medications as previously at home  5. Your potassium was 3.4 prior to discharge. We have repleted with by mouth potassium prior to discharge. You do not need potassium supplements at home but have your potassium rechecked by primary care physician during next appointment.   Discharge Diagnoses:  Principal Problem:   Pancreatitis, acute Active Problems:   Abdominal pain, acute, epigastric   Gallstones   Severe obesity (BMI >= 40)   Essential hypertension, benign    Discharge Condition: medically stable for discharge home today  Diet recommendation: slowly advance the diet  History of present illness:  32 year old female with past medical history significant for morbid obesity and hypertension, recent laparoscopic cholecystectomy on 06/22/2013 who presented to [facility] ED with worsening epigastric pain associated with nausea and vomiting started one day prior to this admission. Patient also complained of chest pain and shortness of breath on the admission.  In ED, vital signs are stable with blood pressure 110/65, heart rate 73 and T 98.5 F, O2 saturation was 98% on room air. CBC revealed leukocytosis of 10.9 but the rest of the blood work is essentially unremarkable. Patient received fentanyl and morphine in ED but pain is still 10 out of 10 in intensity. Further studies included chest x-ray  which was normal. CT angiogram did not reveal pulmonary embolism. CT abdomen revealed postoperative changes from interval cholecystectomy without complication. No acute intra-abdominal findings. HIDA scan was negative for bile leak but there is a possible bile duct obstruction. Lipase level was 280 on admission and repeat level more than 3000, ALP 190, AST 395 and ALT 526, total bilirubin 3.3.   Assessment/Plan:   Principal problem:  Epigastric pain  - Secondary to acute pancreatitis  - received  IV fluids, antiemetics and analgesia in hospital - lipase trending down from more than 3000 to around 700 and 65 this am. LFT's trending down - Appreciate surgery following - recommended conservative management  - advance diet as tolerated Acute pancreatitis  - management as above  Active problem:  Hypertension  - Continue Coreg and Lisinopril/Hctz   Code Status: Full code  Family Communication: Family updated at the bedside     [name], MD  [facility]  Pager [phone]   Consultants:  Surgery  Procedures:  None  Antibiotics:  None   Signed:  [name], MD  [facility] 07/04/2013, 11:35 AM  Pager #: [phone]   Discharge Exam: Filed Vitals:   07/04/13 0515  BP: 119/73  Pulse: 70  Temp: 98.5 F (36.9 C)  Resp: 18   Filed Vitals:   07/03/13 1418 07/03/13 1430 07/03/13 2115 07/04/13 0515  BP: 137/85  124/71 119/73  Pulse: 72  74 70  Temp: 98.5 F (36.9 C)  98.6 F (37 C) 98.5 F (36.9 C)  TempSrc: Oral  Oral Oral  Resp: 20  16 18   Height:      Weight:  177.447  kg (391 lb 3.2 oz)    SpO2: 100%  97% 98%    General: Pt is alert, follows commands appropriately, not in acute distress; morbidly obese Cardiovascular: Regular rate and rhythm, S1/S2 +, no murmurs, no rubs, no gallops Respiratory: Clear to auscultation bilaterally, no wheezing, no crackles, no rhonchi Abdominal: Soft, non tender, non distended, bowel sounds +, no  guarding Extremities: no edema, no cyanosis, pulses palpable bilaterally DP and PT Neuro: Grossly nonfocal  Discharge Instructions  Discharge Orders   Future Appointments Provider Department Dept Phone   07/14/2013 10:30 AM Ernestene Mention, MD [facility] [place] [facility], [place] [phone]   07/27/2013 3:30 PM Louis Meckel, MD [facility] [phone]   Future Orders Complete By Expires   Call MD for:  hives  As directed    Call MD for:  persistant dizziness or light-headedness  As directed    Call MD for:  persistant nausea and vomiting  As directed    Call MD for:  redness, tenderness, or signs of infection (pain, swelling, redness, odor or green/yellow discharge around incision site)  As directed    Diet - low sodium heart healthy  As directed    Discharge instructions  As directed    Comments:     1. You have been hospitalized for acute pancreatitis. Your lipase level on admission was more than 3000 and now 65. Please advance her diet very slowly. Please avoid fried fatty foods. 2. On your next appointment with primary care physician please have them recheck her lipase level and liver function tests. 3. Use Norco as needed for pain relief 4. You may continue taking your blood pressure medications as previously at home 5. Your potassium was 3.4 prior to discharge. We have repleted with by mouth potassium prior to discharge. You do not need potassium supplements at home but have your potassium rechecked by primary care physician during next appointment.   Increase activity slowly  As directed        Medication List         bisacodyl 5 MG EC tablet  Generic drug:  bisacodyl  Take 5 mg by mouth daily as needed for constipation.     carvedilol 12.5 MG tablet  Commonly known as:  COREG  Take 12.5 mg by mouth 2 (two) times daily with a meal. Will only take once daily for now     famotidine 10 MG tablet  Commonly known as:  PEPCID  Take 10 mg by mouth 2 (two)  times daily as needed for heartburn.     GAS-X PO  Take 2 tablets by mouth as needed (gas relief).     HYDROcodone-acetaminophen 10-325 MG per tablet  Commonly known as:  NORCO  Take 1 tablet by mouth every 6 (six) hours as needed for pain.     lisinopril-hydrochlorothiazide 20-25 MG per tablet  Commonly known as:  PRINZIDE,ZESTORETIC  Take 1 tablet by mouth every morning.     multivitamin with minerals Tabs tablet  Take 1 tablet by mouth daily.     ondansetron 4 MG tablet  Commonly known as:  ZOFRAN  Take 1 tablet (4 mg total) by mouth every 8 (eight) hours as needed for nausea.           Follow-up Information   Follow up with [name], MD. Schedule an appointment as soon as possible for a visit in 2 weeks.   Specialty:  Family Medicine       The results  of significant diagnostics from this hospitalization (including imaging, microbiology, ancillary and laboratory) are listed below for reference.    Significant Diagnostic Studies: Dg Chest 2 View  06/29/2013   CLINICAL DATA:  Chest pain and shortness of Breath  EXAM: CHEST  2 VIEW  COMPARISON:  March 31, 2013  FINDINGS: Lungs are clear. Heart size and pulmonary vascularity are normal. No adenopathy. No pneumothorax. No bone lesions.  IMPRESSION: No abnormality noted.   Electronically Signed   By: Bretta Bang   On: 06/29/2013 19:01   Ct Angio Chest Pe W/cm &/or Wo Cm  06/29/2013   CLINICAL DATA:  Chest pain and shortness of Breath  EXAM: CT ANGIOGRAPHY CHEST WITH CONTRAST  TECHNIQUE: Multidetector CT imaging of the chest was performed using the standard protocol during bolus administration of intravenous contrast. Multiplanar CT image reconstructions including MIPs were obtained to evaluate the vascular anatomy.  CONTRAST:  OMNIPAQUE IOHEXOL 350 MG/ML SOLN  COMPARISON:  Chest radiograph June 29, 2013  FINDINGS: There is no demonstrable pulmonary embolus. There is no thoracic aortic aneurysm or dissection.   Lungs are clear. There is no appreciable thoracic adenopathy. Pericardium is not thickened.  Visualized upper abdominal structures appear normal. No bone lesions are identified.  Review of the MIP images confirms the above findings.  IMPRESSION: Lungs clear. No demonstrable pulmonary embolus.   Electronically Signed   By: Bretta Bang   On: 06/29/2013 19:48   Nm Hepatobiliary  07/01/2013   CLINICAL DATA:  Right upper quadrant pain after cholecystectomy. Question bile leak. Normal bilirubin.  EXAM: NUCLEAR MEDICINE HEPATOBILIARY IMAGING  TECHNIQUE: Sequential images of the abdomen were obtained out to 60 minutes following intravenous administration of radiopharmaceutical.  COMPARISON:  CT abdomen and pelvis 07/01/2013.  RADIOPHARMACEUTICALS:  5. Tc-58m Choletec  FINDINGS: The liver demonstrates diffuse radiotracer uptake but no excretion of radiotracer into the common bile duct or bowel is identified. No evidence of biliary leak is seen.  IMPRESSION: Negative for bile leak.  No radiotracer is secreted into bowel likely due to hepatic dysfunction. Bile duct obstruction is possible but less likely given normal bilirubin.   Electronically Signed   By: Drusilla Kanner M.D.   On: 07/01/2013 15:24   Ct Abdomen Pelvis W Contrast  07/01/2013   *RADIOLOGY REPORT*  Clinical Data: Abdominal pain, status post cholecystectomy on 06/29/2013  CT ABDOMEN AND PELVIS WITH CONTRAST  Technique:  Multidetector CT imaging of the abdomen and pelvis was performed following the standard protocol during bolus administration of intravenous contrast.  Contrast: 50mL OMNIPAQUE IOHEXOL 300 MG/ML  SOLN, OMNIPAQUE IOHEXOL 300 MG/ML  SOLN  Comparison: Prior MRI from 04/18/2013  Findings: Visualized lung bases are clear.  The liver demonstrates a normal contrast enhanced appearance without evidence of focal lesion.  Postoperative changes from interval cholecystectomy are present. No fluid collection is seen within the  gallbladder fossa to suggest abscess or myeloma.  No significant biliary ductal dilatation.  The spleen, adrenal glands, and pancreas are within normal limits.  4.1 x 3.1 cm left renal cyst is unchanged.  Kidneys are otherwise unremarkable.  There is no evidence of bowel obstruction.  The appendix is well visualized in the right lower quadrant and is of normal caliber and appearance without associate inflammatory changes to suggest acute appendicitis. No abnormal wall thickening enhancement is seen about the bowels.  Bladder and uterus are within normal limits.  No free air or fluid.  No pathologically enlarged intra-abdominal pelvic lymph nodes are identified.  No acute osseous abnormality identified.  IMPRESSION: Postoperative changes from interval cholecystectomy without complication.  No CT evidence of acute intra-abdominal or pelvic process.   Original Report Authenticated By: Rise Mu, M.D.    Microbiology: No results found for this or any previous visit (from the past 240 hour(s)).   Labs: Basic Metabolic Panel:  Recent Labs Lab 06/29/13 1755 07/01/13 0254 07/02/13 0433 07/03/13 0420 07/04/13 0410  NA 137 139 137 138 136  K 3.7 3.8 3.5 2.9* 3.4*  CL 98 102 104 105 101  CO2 26 28 24 25 25   GLUCOSE 163* 132* 195* 92 146*  BUN 10 10 7 8  5*  CREATININE 0.88 0.90 0.87 0.83 0.80  CALCIUM 10.1 10.0 9.4 9.4 9.5   Liver Function Tests:  Recent Labs Lab 07/01/13 0254 07/02/13 0433 07/03/13 0420 07/04/13 0410  AST 686* 395* 158* 336*  ALT 403* 526* 343* 372*  ALKPHOS 153* 190* 161* 172*  BILITOT 0.9 3.3* 0.8 1.6*  PROT 7.0 6.8 6.2 6.4  ALBUMIN 3.8 3.6 3.2* 3.3*    Recent Labs Lab 07/01/13 0254 07/02/13 0433 07/03/13 0420 07/04/13 0410  LIPASE 280* >3000* 637* 69*   No results found for this basename: AMMONIA,  in the last 168 hours CBC:  Recent Labs Lab 06/29/13 1755 07/01/13 0254 07/02/13 0433  WBC 10.9* 5.4 13.8*  NEUTROABS  --  2.9  --   HGB 12.7  11.8* 11.9*  HCT 38.4 36.5 37.1  MCV 86.1 87.7 86.7  PLT 364 289 306   Cardiac Enzymes:  Recent Labs Lab 06/29/13 1755  TROPONINI <0.30   BNP: BNP (last 3 results)  Recent Labs  06/29/13 1755  PROBNP <5.0   CBG: No results found for this basename: GLUCAP,  in the last 168 hours  Time coordinating discharge: Over 30 minutes

## 2013-07-04 NOTE — Discharge Instructions (Signed)
Acute Pancreatitis °Acute pancreatitis is a disease in which the pancreas becomes suddenly inflamed. The pancreas is a large gland located behind your stomach. The pancreas produces enzymes that help digest food. The pancreas also releases the hormones glucagon and insulin that help regulate blood sugar. Damage to the pancreas occurs when the digestive enzymes from the pancreas are activated and begin attacking the pancreas before being released into the intestine. Most acute attacks last a couple of days and can cause serious complications. Some people become dehydrated and develop low blood pressure. In severe cases, bleeding into the pancreas can lead to shock and can be life-threatening. The lungs, heart, and kidneys may fail. °CAUSES  °Pancreatitis can happen to anyone. In some cases, the cause is unknown. Most cases are caused by: °· Alcohol abuse. °· Gallstones. °Other less common causes are: °· Certain medicines. °· Exposure to certain chemicals. °· Infection. °· Damage caused by an accident (trauma). °· Abdominal surgery. °SYMPTOMS  °· Pain in the upper abdomen that may radiate to the back. °· Tenderness and swelling of the abdomen. °· Nausea and vomiting. °DIAGNOSIS  °Your caregiver will perform a physical exam. Blood and stool tests may be done to confirm the diagnosis. Imaging tests may also be done, such as X-rays, CT scans, or an ultrasound of the abdomen. °TREATMENT  °Treatment usually requires a stay in the hospital. Treatment may include: °· Pain medicine. °· Fluid replacement through an intravenous line (IV). °· Placing a tube in the stomach to remove stomach contents and control vomiting. °· Not eating for 3 or 4 days. This gives your pancreas a rest, because enzymes are not being produced that can cause further damage. °· Antibiotic medicines if your condition is caused by an infection. °· Surgery of the pancreas or gallbladder. °HOME CARE INSTRUCTIONS  °· Follow the diet advised by your  caregiver. This may involve avoiding alcohol and decreasing the amount of fat in your diet. °· Eat smaller, more frequent meals. This reduces the amount of digestive juices the pancreas produces. °· Drink enough fluids to keep your urine clear or pale yellow. °· Only take over-the-counter or prescription medicines as directed by your caregiver. °· Avoid drinking alcohol if it caused your condition. °· Do not smoke. °· Get plenty of rest. °· Check your blood sugar at home as directed by your caregiver. °· Keep all follow-up appointments as directed by your caregiver. °SEEK MEDICAL CARE IF:  °· You do not recover as quickly as expected. °· You develop new or worsening symptoms. °· You have persistent pain, weakness, or nausea. °· You recover and then have another episode of pain. °SEEK IMMEDIATE MEDICAL CARE IF:  °· You are unable to eat or keep fluids down. °· Your pain becomes severe. °· You have a fever or persistent symptoms for more than 2 to 3 days. °· You have a fever and your symptoms suddenly get worse. °· Your skin or the white part of your eyes turn yellow (jaundice). °· You develop vomiting. °· You feel dizzy, or you faint. °· Your blood sugar is high (over 300 mg/dL). °MAKE SURE YOU:  °· Understand these instructions. °· Will watch your condition. °· Will get help right away if you are not doing well or get worse. °Document Released: 09/29/2005 Document Revised: 03/30/2012 Document Reviewed: 01/08/2012 °[employer]® Patient Information ©2014 [employer]. ° °

## 2013-07-14 ENCOUNTER — Ambulatory Visit (INDEPENDENT_AMBULATORY_CARE_PROVIDER_SITE_OTHER): Payer: BC Managed Care – PPO | Admitting: General Surgery

## 2013-07-14 ENCOUNTER — Encounter (INDEPENDENT_AMBULATORY_CARE_PROVIDER_SITE_OTHER): Payer: Self-pay | Admitting: General Surgery

## 2013-07-14 VITALS — BP 128/70 | HR 80 | Temp 97.4°F | Resp 15 | Ht 71.0 in | Wt 387.8 lb

## 2013-07-14 DIAGNOSIS — K8020 Calculus of gallbladder without cholecystitis without obstruction: Secondary | ICD-10-CM

## 2013-07-14 DIAGNOSIS — K859 Acute pancreatitis without necrosis or infection, unspecified: Secondary | ICD-10-CM

## 2013-07-14 NOTE — Progress Notes (Signed)
Patient ID: Angel Dawson, female   DOB: 1981/04/14, 32 y.o.   MRN: [id number] History: This patient has morbid obesity. She underwent elective laparoscopic cholecystectomy on 06/22/2013. We attempted a cholangiogram repeatedly, but we were unable to effectively cannulate the cystic duct and get good flow. Some stones were milked out of the cystic duct. We did not persist because her liver function tests have normalized. She was readmitted with acute pancreatitis with severe pain and elevated lipase. CT scan showed no surgical complication and no anatomic evidence of pancreatitis. Hepatobiliary scan was negative for bile leak. She was discharged and she feels fine now. She is tolerating a diet. Bowels are moving. He is to be a little constipated. She has an appointment with Dr. Melvia Heaps on November 3  Exam: Patient is morbidly obese. Very friendly. Intelligent. Good insight. Abdomen soft. Nontender. All wounds have healed.  Assessment: Chronic cholecystitis with cholelithiasis, no direct surgical complication a laparoscopic cholecystectomy Acute postop pancreatitis. Presumes she passed a common bile duct stone. Pancreatitis has resolved The question remains whether she has any residual common duct stones  Plan: I discussed all this with her. She has good insight into the pathophysiology for gallbladder disease and the pancreatitis. Low-fat diet Resume normal activities  Keep appt.  with Dr. Melvia Heaps. I told her that he would might consider lab tests, MRI, and, if there is a high probability of common duct stone she might even need an ERCP but not for sure Return to see me if needed.     Angel Dawson. Derrell Lolling, M.D., [facility] General and Minimally invasive Surgery Breast and Colorectal Surgery Office:   [phone] Pager:   [phone]

## 2013-07-14 NOTE — Patient Instructions (Signed)
You have recovered from your gallbladder surgery  There are no apparent surgical complications. You may resume normal physical activities.  You were hospitalized for acute pancreatitis, and fortunately that has resolved without any obvious complication. The biggest concern now is whether you still have stones in your main bile duct or not. Hopefully they have all passed.  It is important for you to keep the appointment with Dr. Melvia Heaps for further evaluation and testing to see if there are any residual common bile duct stones.  Return to see Dr. Derrell Lolling if further problems arise.

## 2013-07-27 ENCOUNTER — Ambulatory Visit: Payer: BC Managed Care – PPO | Admitting: Gastroenterology

## 2013-08-15 ENCOUNTER — Other Ambulatory Visit (INDEPENDENT_AMBULATORY_CARE_PROVIDER_SITE_OTHER): Payer: BC Managed Care – PPO

## 2013-08-15 ENCOUNTER — Encounter: Payer: Self-pay | Admitting: Gastroenterology

## 2013-08-15 ENCOUNTER — Ambulatory Visit (INDEPENDENT_AMBULATORY_CARE_PROVIDER_SITE_OTHER): Payer: BC Managed Care – PPO | Admitting: Gastroenterology

## 2013-08-15 VITALS — BP 118/82 | HR 96 | Ht 70.5 in | Wt 398.5 lb

## 2013-08-15 DIAGNOSIS — Z8719 Personal history of other diseases of the digestive system: Secondary | ICD-10-CM

## 2013-08-15 DIAGNOSIS — K859 Acute pancreatitis without necrosis or infection, unspecified: Secondary | ICD-10-CM

## 2013-08-15 LAB — AMYLASE: Amylase: 55 U/L (ref 27–131)

## 2013-08-15 LAB — HEPATIC FUNCTION PANEL
AST: 23 U/L (ref 0–37)
Albumin: 4 g/dL (ref 3.5–5.2)
Alkaline Phosphatase: 82 U/L (ref 39–117)
Total Protein: 7.4 g/dL (ref 6.0–8.3)

## 2013-08-15 NOTE — Patient Instructions (Signed)
Go to the basement for labs today 

## 2013-08-15 NOTE — Progress Notes (Signed)
History of Present Illness: Pleasant 32 year old Afro-American female referred for evaluation of pancreatitis.  In early September she underwent a laparoscopic cholecystectomy for  calculus cholecystitis.  A IOC could not be done.  She was admitted 2 weeks later with acute pancreatitis.  Abnormal liver tests were noted.  CT scan showed postoperative changes but no significant ductal dilatation.  She recovered uneventfully.  Since that time she has been pain-free.  She is without fever or jaundice.  Last set of liver tests on September 22 pertinent for alkaline phosphatase 172, AST 336 and ALT 372.  Total bilirubin was 1.6.    Past Medical History  Diagnosis Date  . Hypertension   . GERD (gastroesophageal reflux disease)   . Abdominal pain     related to gallstones  . Gallstones   . Sinus congestion     occ. uses OTC meds for this  . Sleep apnea 06-15-13    Was told test nonconclusive, unable to sleep and remain on back..Stop/Bang score =4  . Pancreatitis    Past Surgical History  Procedure Laterality Date  . Tonsillectomy    . Cholecystectomy N/A 06/22/2013    Procedure: LAPAROSCOPIC CHOLECYSTECTOMY ;  Surgeon: Ernestene Mention, MD;  Location: [facility]S;  Service: General;  Laterality: N/A;   family history includes Diabetes in her father and mother; Hypertension in her father and mother; Lung cancer in her paternal grandmother; Stroke in her paternal grandfather. Current Outpatient Prescriptions  Medication Sig Dispense Refill  . carvedilol (COREG) 12.5 MG tablet Take 12.5 mg by mouth 2 (two) times daily with a meal. Will only take once daily for now      . famotidine (PEPCID) 10 MG tablet Take 10 mg by mouth 2 (two) times daily as needed for heartburn.      Marland Kitchen lisinopril-hydrochlorothiazide (PRINZIDE,ZESTORETIC) 20-25 MG per tablet Take 1 tablet by mouth every morning.       . Multiple Vitamin (MULTIVITAMIN WITH MINERALS) TABS tablet Take 1 tablet by mouth daily.      . Simethicone (GAS-X  PO) Take 2 tablets by mouth as needed (gas relief).       No current facility-administered medications for this visit.   Allergies as of 08/15/2013  . (No Known Allergies)    reports that she has never smoked. She has never used smokeless tobacco. She reports that she drinks alcohol. She reports that she does not use illicit drugs.     Review of Systems: She complains of occasional pyrosis.  Pertinent positive and negative review of systems were noted in the above HPI section. All other review of systems were otherwise negative.  Vital signs were reviewed in today's medical record Physical Exam: General: Massively obese female in no acute distress Skin: anicteric Head: Normocephalic and atraumatic Eyes:  sclerae anicteric, EOMI Ears: Normal auditory acuity Mouth: No deformity or lesions Neck: Supple, no masses or thyromegaly Lungs: Clear throughout to auscultation Heart: Regular rate and rhythm; no murmurs, rubs or bruits Abdomen: Soft, non tender and non distended. No masses, hepatosplenomegaly or hernias noted. Normal Bowel sounds Rectal:deferred Musculoskeletal: Symmetrical with no gross deformities  Skin: No lesions on visible extremities Pulses:  Normal pulses noted Extremities: No clubbing, cyanosis, edema or deformities noted Neurological: Alert oriented x 4, grossly nonfocal Cervical Nodes:  No significant cervical adenopathy Inguinal Nodes: No significant inguinal adenopathy Psychological:  Alert and cooperative. Normal mood and affect

## 2013-08-15 NOTE — Assessment & Plan Note (Addendum)
I strongly suspect that the patient had  gallstone pancreatitis.  At issue is whether she has passed a stone or has a retained stone.  Note that her last set of LFTs were still abnormal.  Recommendations #1 repeat LFTs; if still significantly abnormal I will proceed directly with ERCP.  If LFTs are normalizing or normal then I'll obtain a followup ultrasound.  The risks, benefits, and possible complications of the procedure, including bleeding, perforation, surgery, and the 5-10% risk for pancreatitis, were explained to the patient.  Patient's questions were answered.

## 2013-08-17 ENCOUNTER — Other Ambulatory Visit: Payer: Self-pay

## 2013-08-17 DIAGNOSIS — R7989 Other specified abnormal findings of blood chemistry: Secondary | ICD-10-CM

## 2013-08-24 ENCOUNTER — Ambulatory Visit (HOSPITAL_COMMUNITY): Payer: BC Managed Care – PPO

## 2013-08-31 ENCOUNTER — Ambulatory Visit (HOSPITAL_COMMUNITY)
Admission: RE | Admit: 2013-08-31 | Discharge: 2013-08-31 | Disposition: A | Discharge status: Home / Self Care | Payer: BC Managed Care – PPO | Source: Ambulatory Visit | Attending: Gastroenterology | Admitting: Gastroenterology

## 2013-08-31 DIAGNOSIS — R7989 Other specified abnormal findings of blood chemistry: Secondary | ICD-10-CM | POA: Insufficient documentation

## 2013-09-05 NOTE — Progress Notes (Signed)
Quick Note:  Please inform the patient that ultrasound shows no evidence for a retained bile duct stone. ______

## 2014-02-14 ENCOUNTER — Other Ambulatory Visit: Payer: Self-pay | Admitting: Obstetrics and Gynecology

## 2014-06-30 DIAGNOSIS — J310 Chronic rhinitis: Secondary | ICD-10-CM | POA: Insufficient documentation

## 2014-08-25 ENCOUNTER — Inpatient Hospital Stay (HOSPITAL_BASED_OUTPATIENT_CLINIC_OR_DEPARTMENT_OTHER)
Admission: EM | Admit: 2014-08-25 | Discharge: 2014-08-26 | DRG: 638 | Disposition: A | Discharge status: Home / Self Care | Payer: BC Managed Care – PPO | Attending: Internal Medicine | Admitting: Internal Medicine

## 2014-08-25 ENCOUNTER — Encounter (HOSPITAL_BASED_OUTPATIENT_CLINIC_OR_DEPARTMENT_OTHER): Payer: Self-pay

## 2014-08-25 DIAGNOSIS — E1110 Type 2 diabetes mellitus with ketoacidosis without coma: Secondary | ICD-10-CM

## 2014-08-25 DIAGNOSIS — I10 Essential (primary) hypertension: Secondary | ICD-10-CM | POA: Diagnosis present

## 2014-08-25 DIAGNOSIS — E1310 Other specified diabetes mellitus with ketoacidosis without coma: Secondary | ICD-10-CM | POA: Diagnosis not present

## 2014-08-25 DIAGNOSIS — Z6841 Body Mass Index (BMI) 40.0 and over, adult: Secondary | ICD-10-CM

## 2014-08-25 DIAGNOSIS — Z8249 Family history of ischemic heart disease and other diseases of the circulatory system: Secondary | ICD-10-CM

## 2014-08-25 DIAGNOSIS — Z833 Family history of diabetes mellitus: Secondary | ICD-10-CM

## 2014-08-25 DIAGNOSIS — E1165 Type 2 diabetes mellitus with hyperglycemia: Secondary | ICD-10-CM | POA: Diagnosis present

## 2014-08-25 DIAGNOSIS — K219 Gastro-esophageal reflux disease without esophagitis: Secondary | ICD-10-CM | POA: Diagnosis present

## 2014-08-25 DIAGNOSIS — Z23 Encounter for immunization: Secondary | ICD-10-CM | POA: Insufficient documentation

## 2014-08-25 DIAGNOSIS — G4730 Sleep apnea, unspecified: Secondary | ICD-10-CM | POA: Diagnosis present

## 2014-08-25 DIAGNOSIS — Z9049 Acquired absence of other specified parts of digestive tract: Secondary | ICD-10-CM | POA: Diagnosis present

## 2014-08-25 HISTORY — DX: Type 2 diabetes mellitus with ketoacidosis without coma: E11.10

## 2014-08-25 LAB — COMPREHENSIVE METABOLIC PANEL
ALT: 21 U/L (ref 0–35)
ANION GAP: 20 — AB (ref 5–15)
AST: 24 U/L (ref 0–37)
Albumin: 4.3 g/dL (ref 3.5–5.2)
Alkaline Phosphatase: 110 U/L (ref 39–117)
BUN: 14 mg/dL (ref 6–23)
CALCIUM: 10.4 mg/dL (ref 8.4–10.5)
CO2: 19 mEq/L (ref 19–32)
Chloride: 94 mEq/L — ABNORMAL LOW (ref 96–112)
Creatinine, Ser: 0.8 mg/dL (ref 0.50–1.10)
GFR calc non Af Amer: >90 mL/min (ref >90)
GLUCOSE: 421 mg/dL — AB (ref 70–99)
Potassium: 4.2 mEq/L (ref 3.7–5.3)
Sodium: 133 mEq/L — ABNORMAL LOW (ref 137–147)
Total Bilirubin: 0.4 mg/dL (ref 0.3–1.2)
Total Protein: 7.9 g/dL (ref 6.0–8.3)

## 2014-08-25 LAB — BASIC METABOLIC PANEL
Anion gap: 18 — ABNORMAL HIGH (ref 5–15)
Anion gap: 19 — ABNORMAL HIGH (ref 5–15)
BUN: 10 mg/dL (ref 6–23)
BUN: 9 mg/dL (ref 6–23)
CALCIUM: 9.6 mg/dL (ref 8.4–10.5)
CALCIUM: 9.4 mg/dL (ref 8.4–10.5)
CO2: 22 meq/L (ref 19–32)
CO2: 21 meq/L (ref 19–32)
CREATININE: 0.7 mg/dL (ref 0.50–1.10)
CREATININE: 0.7 mg/dL (ref 0.50–1.10)
Chloride: 100 mEq/L (ref 96–112)
Chloride: 99 mEq/L (ref 96–112)
GFR calc Af Amer: >90 mL/min (ref >90)
GFR calc Af Amer: >90 mL/min (ref >90)
GFR calc non Af Amer: >90 mL/min (ref >90)
GFR calc non Af Amer: >90 mL/min (ref >90)
GLUCOSE: 142 mg/dL — AB (ref 70–99)
GLUCOSE: 168 mg/dL — AB (ref 70–99)
Potassium: 3.7 mEq/L (ref 3.7–5.3)
Potassium: 3.6 mEq/L — ABNORMAL LOW (ref 3.7–5.3)
SODIUM: 139 meq/L (ref 137–147)
Sodium: 140 mEq/L (ref 137–147)

## 2014-08-25 LAB — CBC WITH DIFFERENTIAL/PLATELET
BASOS ABS: 0 10*3/uL (ref 0.0–0.1)
BASOS PCT: 0 % (ref 0–1)
Band Neutrophils: 0 % (ref 0–10)
Blasts: 0 %
EOS ABS: 0 10*3/uL (ref 0.0–0.7)
EOS PCT: 0 % (ref 0–5)
HEMATOCRIT: 40.6 % (ref 36.0–46.0)
HEMOGLOBIN: 13.5 g/dL (ref 12.0–15.0)
Lymphocytes Relative: 45 % (ref 12–46)
Lymphs Abs: 3.6 10*3/uL (ref 0.7–4.0)
MCH: 27.4 pg (ref 26.0–34.0)
MCHC: 33.3 g/dL (ref 30.0–36.0)
MCV: 82.5 fL (ref 78.0–100.0)
MYELOCYTES: 0 %
Metamyelocytes Relative: 0 %
Monocytes Absolute: 0.2 10*3/uL (ref 0.1–1.0)
Monocytes Relative: 3 % (ref 3–12)
NEUTROS ABS: 4.2 10*3/uL (ref 1.7–7.7)
Neutrophils Relative %: 52 % (ref 43–77)
Platelets: ADEQUATE 10*3/uL (ref 150–400)
Promyelocytes Absolute: 0 %
RBC: 4.92 MIL/uL (ref 3.87–5.11)
RDW: 14.2 % (ref 11.5–15.5)
Smear Review: ADEQUATE
WBC: 8 10*3/uL (ref 4.0–10.5)
nRBC: 0 /100 WBC

## 2014-08-25 LAB — I-STAT VENOUS BLOOD GAS, ED
Acid-base deficit: 1 mmol/L (ref 0.0–2.0)
Bicarbonate: 22 mEq/L (ref 20.0–24.0)
O2 SAT: 95 %
TCO2: 23 mmol/L (ref 0–100)
pCO2, Ven: 31.6 mmHg — ABNORMAL LOW (ref 45.0–50.0)
pH, Ven: 7.449 — ABNORMAL HIGH (ref 7.250–7.300)
pO2, Ven: 70 mmHg — ABNORMAL HIGH (ref 30.0–45.0)

## 2014-08-25 LAB — URINALYSIS, ROUTINE W REFLEX MICROSCOPIC
Bilirubin Urine: NEGATIVE
Glucose, UA: >1000 mg/dL — AB
Hgb urine dipstick: NEGATIVE
LEUKOCYTES UA: NEGATIVE
NITRITE: NEGATIVE
PROTEIN: NEGATIVE mg/dL
Specific Gravity, Urine: 1.033 — ABNORMAL HIGH (ref 1.005–1.030)
Urobilinogen, UA: 0.2 mg/dL (ref 0.0–1.0)
pH: 5.5 (ref 5.0–8.0)

## 2014-08-25 LAB — CBG MONITORING, ED
GLUCOSE-CAPILLARY: 407 mg/dL — AB (ref 70–99)
Glucose-Capillary: 314 mg/dL — ABNORMAL HIGH (ref 70–99)
Glucose-Capillary: 288 mg/dL — ABNORMAL HIGH (ref 70–99)
Glucose-Capillary: 265 mg/dL — ABNORMAL HIGH (ref 70–99)
Glucose-Capillary: 192 mg/dL — ABNORMAL HIGH (ref 70–99)
Glucose-Capillary: 198 mg/dL — ABNORMAL HIGH (ref 70–99)

## 2014-08-25 LAB — CBC
HCT: 37.9 % (ref 36.0–46.0)
HEMOGLOBIN: 12.4 g/dL (ref 12.0–15.0)
MCH: 26.7 pg (ref 26.0–34.0)
MCHC: 32.7 g/dL (ref 30.0–36.0)
MCV: 81.7 fL (ref 78.0–100.0)
Platelets: 236 10*3/uL (ref 150–400)
RBC: 4.64 MIL/uL (ref 3.87–5.11)
RDW: 13.9 % (ref 11.5–15.5)
WBC: 7.3 10*3/uL (ref 4.0–10.5)

## 2014-08-25 LAB — GLUCOSE, CAPILLARY
GLUCOSE-CAPILLARY: 141 mg/dL — AB (ref 70–99)
GLUCOSE-CAPILLARY: 132 mg/dL — AB (ref 70–99)
GLUCOSE-CAPILLARY: 169 mg/dL — AB (ref 70–99)
Glucose-Capillary: 139 mg/dL — ABNORMAL HIGH (ref 70–99)
Glucose-Capillary: 148 mg/dL — ABNORMAL HIGH (ref 70–99)
Glucose-Capillary: 186 mg/dL — ABNORMAL HIGH (ref 70–99)

## 2014-08-25 LAB — MRSA PCR SCREENING: MRSA BY PCR: NEGATIVE

## 2014-08-25 LAB — PREGNANCY, URINE: PREG TEST UR: NEGATIVE

## 2014-08-25 LAB — URINE MICROSCOPIC-ADD ON

## 2014-08-25 LAB — KETONES, QUALITATIVE

## 2014-08-25 LAB — HEMOGLOBIN A1C
Hgb A1c MFr Bld: 10.7 % — ABNORMAL HIGH (ref <5.7)
Mean Plasma Glucose: 260 mg/dL — ABNORMAL HIGH (ref <117)

## 2014-08-25 LAB — LIPASE, BLOOD: LIPASE: 42 U/L (ref 11–59)

## 2014-08-25 MED ORDER — SODIUM CHLORIDE 0.9 % IV SOLN: INTRAVENOUS | Status: DC

## 2014-08-25 MED ORDER — ACETAMINOPHEN 325 MG PO TABS
ORAL_TABLET | ORAL | Status: AC
  Administered 2014-08-25: 650 mg via ORAL
  Filled 2014-08-25: qty 2

## 2014-08-25 MED ORDER — SODIUM CHLORIDE 0.9 % IV SOLN
INTRAVENOUS | Status: AC
  Administered 2014-08-25: 12:00:00 via INTRAVENOUS

## 2014-08-25 MED ORDER — SODIUM CHLORIDE 0.9 % IV BOLUS (SEPSIS)
1000.0000 mL | Freq: Once | INTRAVENOUS | Status: AC
  Administered 2014-08-25: 1000 mL via INTRAVENOUS

## 2014-08-25 MED ORDER — ENOXAPARIN SODIUM 40 MG/0.4ML ~~LOC~~ SOLN
40.0000 mg | SUBCUTANEOUS | Status: DC
  Filled 2014-08-25 (×2): qty 0.4

## 2014-08-25 MED ORDER — POTASSIUM CHLORIDE 10 MEQ/100ML IV SOLN
10.0000 meq | INTRAVENOUS | Status: AC
  Administered 2014-08-25 (×2): 10 meq via INTRAVENOUS
  Filled 2014-08-25 (×2): qty 100

## 2014-08-25 MED ORDER — INFLUENZA VAC SPLIT QUAD 0.5 ML IM SUSY
0.5000 mL | PREFILLED_SYRINGE | INTRAMUSCULAR | Status: AC
  Administered 2014-08-26: 0.5 mL via INTRAMUSCULAR
  Filled 2014-08-25: qty 0.5

## 2014-08-25 MED ORDER — DEXTROSE-NACL 5-0.45 % IV SOLN
INTRAVENOUS | Status: DC
  Administered 2014-08-25: 1000 mL via INTRAVENOUS
  Administered 2014-08-26: 10:00:00 via INTRAVENOUS

## 2014-08-25 MED ORDER — ACETAMINOPHEN 325 MG PO TABS
650.0000 mg | ORAL_TABLET | Freq: Once | ORAL | Status: AC
  Administered 2014-08-25: 650 mg via ORAL

## 2014-08-25 MED ORDER — ACETAMINOPHEN 325 MG PO TABS
650.0000 mg | ORAL_TABLET | Freq: Once | ORAL | Status: AC
  Administered 2014-08-25: 650 mg via ORAL
  Filled 2014-08-25: qty 2

## 2014-08-25 MED ORDER — DEXTROSE 50 % IV SOLN: 25.0000 mL | INTRAVENOUS | Status: DC | PRN

## 2014-08-25 MED ORDER — INSULIN REGULAR HUMAN 100 UNIT/ML IJ SOLN
INTRAMUSCULAR | Status: DC
  Administered 2014-08-25: 2.5 [IU]/h via INTRAVENOUS
  Administered 2014-08-25: 19:00:00 via INTRAVENOUS
  Filled 2014-08-25: qty 2.5

## 2014-08-25 NOTE — H&P (Signed)
History and Physical  Connor Foxworthy ZLD:[id number] DOB: [birth year] DOA: 08/25/2014  Referring physician: Ezequiel Essex, ER physician PCP: Cletis Media, MD   Chief Complaint: dizziness, polyuria  HPI: Angel Dawson is a 33 y.o. female  With past medical history of morbid obesity and hypertension who for the last few weeks has noted significant polyuria and then in the last few days more dizziness especially when sitting up. She also noted some tingling in her fingers and became concerned and suspected she may have diabetes. She was able to get a glucometer today and checked her sugar and noted it was in the high 300s. Patient came to [facility] where she was found have a blood sugar of 420, an anion gap of 20, but otherwise normal labs including normal renal function. Hospitalists were called for admission and patient was transferred to [facility]. She was started on insulin and IV fluids.   Review of Systems:  Patient seen after arrival to floor. Pt complains of feeling thirsty, frequent urination and fatigue.earlier she was reporting some numbness in her first and second fingers on her left hand which have since resolved.  Pt denies any headaches, vision changes, dysphagia, chest pain, palpitations, short as of breath, wheeze, cough, abdominal pain, hematuria, dysuria, constipation, diarrhea, focal extremity weakness or pain.  Review of systems are otherwise negative  Past Medical History  Diagnosis Date  . Hypertension   . GERD (gastroesophageal reflux disease)   . Abdominal pain     related to gallstones  . Gallstones   . Sinus congestion     occ. uses OTC meds for this  . Sleep apnea 06-15-13    Was told test nonconclusive, unable to sleep and remain on back..Stop/Bang score =4  . Pancreatitis   . DKA (diabetic ketoacidoses) 08/25/2014   Past Surgical History  Procedure Laterality Date  . Tonsillectomy    . Cholecystectomy N/A 06/22/2013    Procedure:  LAPAROSCOPIC CHOLECYSTECTOMY ;  Surgeon: Adin Hector, MD;  Location: [facility];  Service: General;  Laterality: N/A;   Social History:  reports that she has never smoked. She has never used smokeless tobacco. She reports that she drinks alcohol. She reports that she does not use illicit drugs. Patient lives at home by herself & is able to participate in activities of daily living without assistance  No Known Allergies  Family History  Problem Relation Age of Onset  . Hypertension Mother   . Diabetes Mother   . Hypertension Father   . Diabetes Father   . Stroke Paternal Grandfather   . Lung cancer Paternal Grandmother     As confirmed by patient  Prior to Admission medications   Medication Sig Start Date End Date Taking? Authorizing Provider  carvedilol (COREG) 12.5 MG tablet Take 12.5 mg by mouth 2 (two) times daily with a meal. Will only take once daily for now    Historical Provider, MD  famotidine (PEPCID) 10 MG tablet Take 10 mg by mouth 2 (two) times daily as needed for heartburn.    Historical Provider, MD  lisinopril-hydrochlorothiazide (PRINZIDE,ZESTORETIC) 20-25 MG per tablet Take 1 tablet by mouth every morning.     Historical Provider, MD  Multiple Vitamin (MULTIVITAMIN WITH MINERALS) TABS tablet Take 1 tablet by mouth daily.    Historical Provider, MD  Simethicone (GAS-X PO) Take 2 tablets by mouth as needed (gas relief).    Historical Provider, MD    Physical Exam: BP 123/74 mmHg  Pulse 95  Temp(Src) 98.3 F (36.8 C) (Oral)  Resp 17  Ht 5\' 11"  (1.803 m)  Wt 182.4 kg (402 lb 1.9 oz)  BMI 56.11 kg/m2  SpO2 98%  LMP 08/12/2014  General:  Alert and oriented 3, no acute distress Eyes: sclera nonicteric, extraocular Movements are intact ENT: normocephalic, atraumatic, mucous membranes are slightly dry Neck: no JVD Cardiovascular: regular rate and rhythm, S1-S2 Respiratory: clear to auscultation bilaterally Abdomen: soft, obese, nontender, positive bowel  sounds Skin: no skin breaks, tears or lesions Musculoskeletal: no clubbing or cyanosis or edema Psychiatric: patient is appropriate, no evidence of psychoses Neurologic: no focal deficits          Labs on Admission:  Basic Metabolic Panel:  Recent Labs Lab 08/25/14 0920  NA 133*  K 4.2  CL 94*  CO2 19  GLUCOSE 421*  BUN 14  CREATININE 0.80  CALCIUM 10.4   Liver Function Tests:  Recent Labs Lab 08/25/14 0920  AST 24  ALT 21  ALKPHOS 110  BILITOT 0.4  PROT 7.9  ALBUMIN 4.3    Recent Labs Lab 08/25/14 0920  LIPASE 42   No results for input(s): AMMONIA in the last 168 hours. CBC:  Recent Labs Lab 08/25/14 0920  WBC 8.0  NEUTROABS 4.2  HGB 13.5  HCT 40.6  MCV 82.5  PLT PLATELET CLUMPS NOTED ON SMEAR, COUNT APPEARS ADEQUATE   Cardiac Enzymes: No results for input(s): CKTOTAL, CKMB, CKMBINDEX, TROPONINI in the last 168 hours.  BNP (last 3 results) No results for input(s): PROBNP in the last 8760 hours. CBG:  Recent Labs Lab 08/25/14 1135 08/25/14 1243 08/25/14 1345 08/25/14 1457 08/25/14 1657  GLUCAP 288* 265* 192* 198* 148*     Assessment/Plan Present on Admission:  . DKA (diabetic ketoacidoses): No diagnosis of diabetes. For now DKA protocol with IV fluids and insulin. Change over to subcutaneous Lantus 10 units daily once anion gap resolved. Patient's A1c at 10.7 noting an average blood sugar of 260 which is not terrible. Patient could potentially go on metformin before going on long-term insulin. Asked diabetes educator to see . Hypertension: Patient's intravascular volume is depleted, will hold antihypertensives . Morbid obesity: Patient meets criteria with BMI greater than 40   Consultants: Will consult diabetic educator for new onset diabetes  Code Status: full code  Family Communication: no family present   Disposition Plan: possibly home as early as tomorrow or Sunday following resolution of acidosis, long-term plan for patient's  diabetic management and outpatient referral  Time spent: 35 minutes  [facility] Pager [phone]

## 2014-08-25 NOTE — ED Provider Notes (Signed)
CSN: [id number]     Arrival date & time 08/25/14  4098 History  This chart was scribed for Angel Essex, MD by Ludger Nutting, ED Scribe. This patient was seen in room MH06/MH06 and the patient's care was started 8:50 AM.    Chief Complaint  Patient presents with  . Hyperglycemia   The history is provided by the patient. No language interpreter was used.    HPI Comments: Angel Dawson is a 33 y.o. female who presents to the Emergency Department complaining of gradual onset, intermittent episodes of polydipsia and polyuria that began 2 weeks ago. Patient states she has been voiding about 10-13 each day. She reports associated episodes of lightheadedness upon standing and intermittent right pinky and ring finger tingling. She reports having an episode of vomiting last night. She states she bought a glucose monitor this morning and had levels of 320 fasting, 559 one hour postprandial, and 514 two hour postprandial. She denies abdominal pain, chest pain, HA, current lightheadedness.   PCP Conley Canal  Past Medical History  Diagnosis Date  . Hypertension   . GERD (gastroesophageal reflux disease)   . Abdominal pain     related to gallstones  . Gallstones   . Sinus congestion     occ. uses OTC meds for this  . Sleep apnea 06-15-13    Was told test nonconclusive, unable to sleep and remain on back..Stop/Bang score =4  . Pancreatitis   . DKA (diabetic ketoacidoses) 08/25/2014   Past Surgical History  Procedure Laterality Date  . Tonsillectomy    . Cholecystectomy N/A 06/22/2013    Procedure: LAPAROSCOPIC CHOLECYSTECTOMY ;  Surgeon: Adin Hector, MD;  Location: [facility];  Service: General;  Laterality: N/A;   Family History  Problem Relation Age of Onset  . Hypertension Mother   . Diabetes Mother   . Hypertension Father   . Diabetes Father   . Stroke Paternal Grandfather   . Lung cancer Paternal Grandmother    History  Substance Use Topics  . Smoking status: Never Smoker   .  Smokeless tobacco: Never Used  . Alcohol Use: Yes     Comment: 1 drink per month   OB History    No data available     Review of Systems  A complete 10 system review of systems was obtained and all systems are negative except as noted in the HPI and PMH.    Allergies  Review of patient's allergies indicates no known allergies.  Home Medications   Prior to Admission medications   Medication Sig Start Date End Date Taking? Authorizing Provider  carvedilol (COREG) 12.5 MG tablet Take 12.5 mg by mouth 2 (two) times daily with a meal. Will only take once daily for now    Historical Provider, MD  famotidine (PEPCID) 10 MG tablet Take 10 mg by mouth 2 (two) times daily as needed for heartburn.    Historical Provider, MD  lisinopril-hydrochlorothiazide (PRINZIDE,ZESTORETIC) 20-25 MG per tablet Take 1 tablet by mouth every morning.     Historical Provider, MD  Multiple Vitamin (MULTIVITAMIN WITH MINERALS) TABS tablet Take 1 tablet by mouth daily.    Historical Provider, MD  Simethicone (GAS-X PO) Take 2 tablets by mouth as needed (gas relief).    Historical Provider, MD   BP 123/74 mmHg  Pulse 95  Temp(Src) 98.3 F (36.8 C) (Oral)  Resp 17  Ht 5\' 11"  (1.803 m)  Wt 402 lb 1.9 oz (182.4 kg)  BMI 56.11 kg/m2  SpO2 98%  LMP 08/12/2014 Physical Exam  Constitutional: She is oriented to person, place, and time. She appears well-developed and well-nourished. No distress.  Morbidly obese  HENT:  Head: Normocephalic and atraumatic.  Mouth/Throat: No oropharyngeal exudate.  Dry mucus membranes   Eyes: Conjunctivae and EOM are normal. Pupils are equal, round, and reactive to light.  Neck: Normal range of motion. Neck supple.  No meningismus.  Cardiovascular: Normal rate, regular rhythm, normal heart sounds and intact distal pulses.   No murmur heard. Pulmonary/Chest: Effort normal and breath sounds normal. No respiratory distress.  Abdominal: Soft. There is no tenderness. There is no  rebound and no guarding.  Musculoskeletal: Normal range of motion. She exhibits no edema or tenderness.  Neurological: She is alert and oriented to person, place, and time. No cranial nerve deficit. She exhibits normal muscle tone. Coordination normal.  No ataxia on finger to nose bilaterally. No pronator drift. 5/5 strength throughout. CN 2-12 intact. Negative Romberg. Equal grip strength. Sensation intact. Gait is normal.   Skin: Skin is warm.  Psychiatric: She has a normal mood and affect. Her behavior is normal.  Nursing note and vitals reviewed.   ED Course  Procedures (including critical care time)  DIAGNOSTIC STUDIES: Oxygen Saturation is 98% on RA, normal by my interpretation.    COORDINATION OF CARE: 8:57 AM Discussed treatment plan with pt at bedside and pt agreed to plan.  10:35 AM Updated patient on lab results and the need for hospitalization. Patient understands and agrees.    Labs Review Labs Reviewed  URINALYSIS, ROUTINE W REFLEX MICROSCOPIC - Abnormal; Notable for the following:    Specific Gravity, Urine 1.033 (*)    Glucose, UA >1000 (*)    Ketones, ur >80 (*)    All other components within normal limits  COMPREHENSIVE METABOLIC PANEL - Abnormal; Notable for the following:    Sodium 133 (*)    Chloride 94 (*)    Glucose, Bld 421 (*)    Anion gap 20 (*)    All other components within normal limits  KETONES, QUALITATIVE - Abnormal; Notable for the following:    Acetone, Bld SMALL (*)    All other components within normal limits  HEMOGLOBIN A1C - Abnormal; Notable for the following:    Hgb A1c MFr Bld 10.7 (*)    Mean Plasma Glucose 260 (*)    All other components within normal limits  CBG MONITORING, ED - Abnormal; Notable for the following:    Glucose-Capillary 407 (*)    All other components within normal limits  I-STAT VENOUS BLOOD GAS, ED - Abnormal; Notable for the following:    pH, Ven 7.449 (*)    pCO2, Ven 31.6 (*)    pO2, Ven 70.0 (*)    All  other components within normal limits  CBG MONITORING, ED - Abnormal; Notable for the following:    Glucose-Capillary 314 (*)    All other components within normal limits  CBG MONITORING, ED - Abnormal; Notable for the following:    Glucose-Capillary 288 (*)    All other components within normal limits  CBG MONITORING, ED - Abnormal; Notable for the following:    Glucose-Capillary 265 (*)    All other components within normal limits  CBG MONITORING, ED - Abnormal; Notable for the following:    Glucose-Capillary 192 (*)    All other components within normal limits  CBG MONITORING, ED - Abnormal; Notable for the following:    Glucose-Capillary 198 (*)    All  other components within normal limits  PREGNANCY, URINE  CBC WITH DIFFERENTIAL  LIPASE, BLOOD  URINE MICROSCOPIC-ADD ON  BLOOD GAS, VENOUS    Imaging Review No results found.   EKG Interpretation None      MDM   Final diagnoses:  Diabetic ketoacidosis without coma associated with type 2 diabetes mellitus   2 weeks of increased thirst, frequent urination and weakness. Hyperglycemia on home monitor. No history of diabetes. No fever or infectious symptoms.  Sugar 407. Dry mucous membranes.  Bicarbonate 19. Anion gap 20. Large ketones in urine. We'll treat as DKA.  Insulin gtt started and IVF continued. Admission d/w Dr. Waldron Labs.   CRITICAL CARE Performed by: Angel Dawson Total critical care time: 30 Critical care time was exclusive of separately billable procedures and treating other patients. Critical care was necessary to treat or prevent imminent or life-threatening deterioration. Critical care was time spent personally by me on the following activities: development of treatment plan with patient and/or surrogate as well as nursing, discussions with consultants, evaluation of patient's response to treatment, examination of patient, obtaining history from patient or surrogate, ordering and performing  treatments and interventions, ordering and review of laboratory studies, ordering and review of radiographic studies, pulse oximetry and re-evaluation of patient's condition.   I personally performed the services described in this documentation, which was scribed in my presence. The recorded information has been reviewed and is accurate.   Angel Essex, MD 08/25/14 [phone]

## 2014-08-25 NOTE — ED Notes (Signed)
MD at bedside. 

## 2014-08-25 NOTE — Progress Notes (Signed)
She presents with [facility] urgent [facility] with complaints of weakness, polyuria, polydipsia, found to have new onset diabetes, and mild DKA with anion gap of 20, a normal pH,with hyperglycemia 400,she was started on insulin drip, and being admitted to [facility].

## 2014-08-25 NOTE — ED Notes (Signed)
Pt has a 2 week history of increased urination, thirst and dizziness.  She bought a glucose monitor and fasting glucose was 328 this am and 1 hour after eating rechecked and was 459.

## 2014-08-26 DIAGNOSIS — Z833 Family history of diabetes mellitus: Secondary | ICD-10-CM | POA: Diagnosis not present

## 2014-08-26 DIAGNOSIS — E1165 Type 2 diabetes mellitus with hyperglycemia: Secondary | ICD-10-CM | POA: Diagnosis present

## 2014-08-26 DIAGNOSIS — Z6841 Body Mass Index (BMI) 40.0 and over, adult: Secondary | ICD-10-CM | POA: Diagnosis not present

## 2014-08-26 DIAGNOSIS — K219 Gastro-esophageal reflux disease without esophagitis: Secondary | ICD-10-CM | POA: Diagnosis present

## 2014-08-26 DIAGNOSIS — I10 Essential (primary) hypertension: Secondary | ICD-10-CM | POA: Diagnosis present

## 2014-08-26 DIAGNOSIS — Z9049 Acquired absence of other specified parts of digestive tract: Secondary | ICD-10-CM | POA: Diagnosis present

## 2014-08-26 DIAGNOSIS — G4730 Sleep apnea, unspecified: Secondary | ICD-10-CM | POA: Diagnosis present

## 2014-08-26 DIAGNOSIS — Z8249 Family history of ischemic heart disease and other diseases of the circulatory system: Secondary | ICD-10-CM | POA: Diagnosis not present

## 2014-08-26 DIAGNOSIS — E1310 Other specified diabetes mellitus with ketoacidosis without coma: Secondary | ICD-10-CM | POA: Diagnosis present

## 2014-08-26 DIAGNOSIS — Z23 Encounter for immunization: Secondary | ICD-10-CM | POA: Diagnosis not present

## 2014-08-26 LAB — BASIC METABOLIC PANEL
Anion gap: 17 — ABNORMAL HIGH (ref 5–15)
BUN: 8 mg/dL (ref 6–23)
CALCIUM: 9 mg/dL (ref 8.4–10.5)
CHLORIDE: 97 meq/L (ref 96–112)
CO2: 23 mEq/L (ref 19–32)
Creatinine, Ser: 0.75 mg/dL (ref 0.50–1.10)
GFR calc Af Amer: >90 mL/min (ref >90)
GFR calc non Af Amer: >90 mL/min (ref >90)
GLUCOSE: 183 mg/dL — AB (ref 70–99)
Potassium: 4 mEq/L (ref 3.7–5.3)
Sodium: 137 mEq/L (ref 137–147)

## 2014-08-26 LAB — GLUCOSE, CAPILLARY
GLUCOSE-CAPILLARY: 191 mg/dL — AB (ref 70–99)
GLUCOSE-CAPILLARY: 152 mg/dL — AB (ref 70–99)
GLUCOSE-CAPILLARY: 328 mg/dL — AB (ref 70–99)
Glucose-Capillary: 162 mg/dL — ABNORMAL HIGH (ref 70–99)
Glucose-Capillary: 170 mg/dL — ABNORMAL HIGH (ref 70–99)
Glucose-Capillary: 178 mg/dL — ABNORMAL HIGH (ref 70–99)
Glucose-Capillary: 191 mg/dL — ABNORMAL HIGH (ref 70–99)
Glucose-Capillary: 163 mg/dL — ABNORMAL HIGH (ref 70–99)
Glucose-Capillary: 177 mg/dL — ABNORMAL HIGH (ref 70–99)
Glucose-Capillary: 324 mg/dL — ABNORMAL HIGH (ref 70–99)

## 2014-08-26 MED ORDER — ACETAMINOPHEN 325 MG PO TABS
650.0000 mg | ORAL_TABLET | Freq: Four times a day (QID) | ORAL | Status: DC | PRN
Start: 2014-08-26 — End: 2014-08-26
  Administered 2014-08-26: 650 mg via ORAL
  Filled 2014-08-26: qty 2

## 2014-08-26 MED ORDER — METFORMIN HCL 500 MG PO TABS
500.0000 mg | ORAL_TABLET | ORAL | Status: AC
  Administered 2014-08-26: 500 mg via ORAL
  Filled 2014-08-26 (×2): qty 1

## 2014-08-26 MED ORDER — METFORMIN HCL 500 MG PO TABS: 500.0000 mg | ORAL_TABLET | Freq: Two times a day (BID) | ORAL | Status: DC

## 2014-08-26 MED ORDER — INSULIN ASPART 100 UNIT/ML ~~LOC~~ SOLN: 0.0000 [IU] | Freq: Every day | SUBCUTANEOUS | Status: DC

## 2014-08-26 MED ORDER — INSULIN ASPART 100 UNIT/ML ~~LOC~~ SOLN
0.0000 [IU] | Freq: Three times a day (TID) | SUBCUTANEOUS | Status: DC
  Administered 2014-08-26 (×2): 15 [IU] via SUBCUTANEOUS

## 2014-08-26 MED ORDER — INSULIN GLARGINE 100 UNIT/ML ~~LOC~~ SOLN
8.0000 [IU] | SUBCUTANEOUS | Status: AC
  Administered 2014-08-26: 8 [IU] via SUBCUTANEOUS
  Filled 2014-08-26: qty 0.08

## 2014-08-26 MED ORDER — ENOXAPARIN SODIUM 80 MG/0.8ML ~~LOC~~ SOLN
80.0000 mg | Freq: Every day | SUBCUTANEOUS | Status: DC
  Administered 2014-08-26: 80 mg via SUBCUTANEOUS
  Filled 2014-08-26: qty 0.8

## 2014-08-26 MED ORDER — BLOOD GLUCOSE METER KIT: PACK | Status: AC

## 2014-08-26 NOTE — Discharge Summary (Signed)
Discharge Summary  Angel Dawson SVX:[id number] DOB: June 25, 1981  PCP: Nance Pear., NP  Admit date: 08/25/2014 Discharge date: 08/26/2014  Time spent: 25 minutes  Recommendations for Outpatient Follow-up:  1. New medication: Metformin 500 mg by mouth twice a day 2. Patient will be called on Monday for referral for outpatient diabetes education 3. Patient given prescription for glucose meter plus test strips and lancets 4. Patient will follow-up with her new primary care physician, Debbrah Alar in 3 weeks as scheduled. At that time her blood sugars can be followed and determine if additional medication needed 5. Patient advised to see her ophthalmologist annually   Discharge Diagnoses:  Active Hospital Problems   Diagnosis Date Noted  . DKA (diabetic ketoacidoses) 08/25/2014  . Hypertension 08/25/2014  . Morbid obesity 08/25/2014   New onset diabetes mellitus type 2 08/25/2014    Resolved Hospital Problems   Diagnosis Date Noted Date Resolved  No resolved problems to display.    Discharge Condition: improved, being discharged home  Diet recommendation: carb modified low sodium  Filed Weights   08/25/14 0846 08/25/14 1641 08/26/14 0415  Weight: 190.511 kg (420 lb) 182.4 kg (402 lb 1.9 oz) 175.4 kg (386 lb 11 oz)    History of present illness:  Patient is a 33 year old female past mental history of morbid obesity and hypertension who has had several weeks of polyuria and lightheadedness and came to the emergency room and found to be in DKA. Admitted to the hospitalist service  Hospital Course:  Principal Problem:   DKA (diabetic ketoacidoses)in the setting of new onset diabetes mellitus type 2: Patient started on DKA protocol with IV fluids and insulin Glucomander. Anion gap initially 20. By following morning, anion gap had resolved. Patient received Lantus 1 dose and able to be weaned off of drip. A1c donemildly elevated at 10.7 yielding an average sugar of  260. Felt best if patient starts on metformin and prescription given. Because it is the weekend, patient unable to see diabetes educator prior to discharge. Given printed information and plan for diabetes educator to call patient on Monday. Patient has scheduled appointment with her new PCP and will follow up with her in the next few weeks. At that time, metformin can be assessed to see if she needs additional medication. Prescription also given for glucometer and lancets and strips Active Problems:   Hypertension: Home medications, lisinopril and awake, initially held due to volume depletion by DKA. Patient will resume these on discharge   Morbid obesity: Patient meets criteria with BMI greater than 40    Procedures:  none  Consultations:  none  Discharge Exam: BP 137/79 mmHg  Pulse 95  Temp(Src) 97.8 F (36.6 C) (Oral)  Resp 15  Ht 5' 11"  (1.803 m)  Wt 175.4 kg (386 lb 11 oz)  BMI 53.96 kg/m2  SpO2 100%  LMP 08/12/2014  General: alert and oriented 3, no acute distress Cardiovascular: regular rate and rhythm, S1-S2 Respiratory: clear to auscultation bilaterallysoft  Discharge Instructions You were cared for by a hospitalist during your hospital stay. If you have any questions about your discharge medications or the care you received while you were in the hospital after you are discharged, you can call the unit and asked to speak with the hospitalist on call if the hospitalist that took care of you is not available. Once you are discharged, your primary care physician will handle any further medical issues. Please note that NO REFILLS for any discharge medications will be authorized  once you are discharged, as it is imperative that you return to your primary care physician (or establish a relationship with a primary care physician if you do not have one) for your aftercare needs so that they can reassess your need for medications and monitor your lab values.  Discharge Instructions     Diet - low sodium heart healthy    Complete by:  As directed      Increase activity slowly    Complete by:  As directed             Medication List    TAKE these medications        blood glucose meter kit and supplies  Dispense based on patient and insurance preference. Use up to four times daily as directed. (FOR ICD-9 250.00, 250.01).     carvedilol 12.5 MG tablet  Commonly known as:  COREG  Take 12.5 mg by mouth 2 (two) times daily with a meal.     ibuprofen 200 MG tablet  Commonly known as:  ADVIL,MOTRIN  Take 400 mg by mouth daily as needed (body aches/pain).     lisinopril-hydrochlorothiazide 20-25 MG per tablet  Commonly known as:  PRINZIDE,ZESTORETIC  Take 1 tablet by mouth daily.     metFORMIN 500 MG tablet  Commonly known as:  GLUCOPHAGE  Take 1 tablet (500 mg total) by mouth 2 (two) times daily with a meal.     multivitamin with minerals Tabs tablet  Take 1 tablet by mouth daily.     PEPCID AC MAXIMUM STRENGTH 20 MG tablet  Generic drug:  famotidine  Take 20 mg by mouth daily.     SYSTANE OP  Place 1 drop into both eyes daily as needed (dry eyes).       No Known Allergies     Follow-up Information    Follow up with Nance Pear., NP.   Specialty:  Internal Medicine   Contact information:   [address] [place] [address] [phone]        The results of significant diagnostics from this hospitalization (including imaging, microbiology, ancillary and laboratory) are listed below for reference.    Significant Diagnostic Studies: No results found.  Microbiology: Recent Results (from the past 240 hour(s))  MRSA PCR Screening     Status: None   Collection Time: 08/25/14  6:40 PM  Result Value Ref Range Status   MRSA by PCR NEGATIVE NEGATIVE Final    Comment:        The GeneXpert MRSA Assay (FDA approved for NASAL specimens only), is one component of a comprehensive MRSA colonization surveillance program.  It is not intended to diagnose MRSA infection nor to guide or monitor treatment for MRSA infections.      Labs: Basic Metabolic Panel:  Recent Labs Lab 08/25/14 0920 08/25/14 1849 08/25/14 2124 08/26/14 0515  NA 133* 140 139 137  K 4.2 3.7 3.6* 4.0  CL 94* 100 99 97  CO2 19 22 21 23   GLUCOSE 421* 142* 168* 183*  BUN 14 10 9 8   CREATININE 0.80 0.70 0.70 0.75  CALCIUM 10.4 9.6 9.4 9.0   Liver Function Tests:  Recent Labs Lab 08/25/14 0920  AST 24  ALT 21  ALKPHOS 110  BILITOT 0.4  PROT 7.9  ALBUMIN 4.3    Recent Labs Lab 08/25/14 0920  LIPASE 42   No results for input(s): AMMONIA in the last 168 hours. CBC:  Recent Labs Lab 08/25/14 0920  08/25/14 1849  WBC 8.0 7.3  NEUTROABS 4.2  --   HGB 13.5 12.4  HCT 40.6 37.9  MCV 82.5 81.7  PLT PLATELET CLUMPS NOTED ON SMEAR, COUNT APPEARS ADEQUATE 236   Cardiac Enzymes: No results for input(s): CKTOTAL, CKMB, CKMBINDEX, TROPONINI in the last 168 hours. BNP: BNP (last 3 results) No results for input(s): PROBNP in the last 8760 hours. CBG:  Recent Labs Lab 08/26/14 0822 08/26/14 0930 08/26/14 1050 08/26/14 1415 08/26/14 1552  GLUCAP 177* 163* 152* 328* 324*       Signed:  Annita Brod  [facility] 08/26/2014, 4:51 PM

## 2014-08-26 NOTE — Progress Notes (Signed)
Patient discharge teaching performed. All questions answered and patient expressed understanding of all information.  She is stable at this time and ready to discharge home.

## 2014-08-26 NOTE — Discharge Instructions (Signed)
Blood Glucose Monitoring °Monitoring your blood glucose (also know as blood sugar) helps you to manage your diabetes. It also helps you and your health care provider monitor your diabetes and determine how well your treatment plan is working. °WHY SHOULD YOU MONITOR YOUR BLOOD GLUCOSE? °· It can help you understand how food, exercise, and medicine affect your blood glucose. °· It allows you to know what your blood glucose is at any given moment. You can quickly tell if you are having low blood glucose (hypoglycemia) or high blood glucose (hyperglycemia). °· It can help you and your health care provider know how to adjust your medicines. °· It can help you understand how to manage an illness or adjust medicine for exercise. °WHEN SHOULD YOU TEST? °Your health care provider will help you decide how often you should check your blood glucose. This may depend on the type of diabetes you have, your diabetes control, or the types of medicines you are taking. Be sure to write down all of your blood glucose readings so that this information can be reviewed with your health care provider. See below for examples of testing times that your health care provider may suggest. °Type 1 Diabetes °· Test 4 times a day if you are in good control, using an insulin pump, or perform multiple daily injections. °· If your diabetes is not well controlled or if you are sick, you may need to monitor more often. °· It is a good idea to also monitor: °· Before and after exercise. °· Between meals and 2 hours after a meal. °· Occasionally between 2:00 a.m. and 3:00 a.m. °Type 2 Diabetes °· It can vary with each person, but generally, if you are on insulin, test 4 times a day. °· If you take medicines by mouth (orally), test 2 times a day. °· If you are on a controlled diet, test once a day. °· If your diabetes is not well controlled or if you are sick, you may need to monitor more often. °HOW TO MONITOR YOUR BLOOD GLUCOSE °Supplies  Needed °· Blood glucose meter. °· Test strips for your meter. Each meter has its own strips. You must use the strips that go with your own meter. °· A pricking needle (lancet). °· A device that holds the lancet (lancing device). °· A journal or log book to write down your results. °Procedure °· Wash your hands with soap and water. Alcohol is not preferred. °· Prick the side of your finger (not the tip) with the lancet. °· Gently milk the finger until a small drop of blood appears. °· Follow the instructions that come with your meter for inserting the test strip, applying blood to the strip, and using your blood glucose meter. °Other Areas to Get Blood for Testing °Some meters allow you to use other areas of your body (other than your finger) to test your blood. These areas are called alternative sites. The most common alternative sites are: °· The forearm. °· The thigh. °· The back area of the lower leg. °· The palm of the hand. °The blood flow in these areas is slower. Therefore, the blood glucose values you get may be delayed, and the numbers are different from what you would get from your fingers. Do not use alternative sites if you think you are having hypoglycemia. Your reading will not be accurate. Always use a finger if you are having hypoglycemia. Also, if you cannot feel your lows (hypoglycemia unawareness), always use your fingers for your   blood glucose checks. ADDITIONAL TIPS FOR GLUCOSE MONITORING  Do not reuse lancets.  Always carry your supplies with you.  All blood glucose meters have a 24-hour "hotline" number to call if you have questions or need help.  Adjust (calibrate) your blood glucose meter with a control solution after finishing a few boxes of strips. BLOOD GLUCOSE RECORD KEEPING It is a good idea to keep a daily record or log of your blood glucose readings. Most glucose meters, if not all, keep your glucose records stored in the meter. Some meters come with the ability to download  your records to your home computer. Keeping a record of your blood glucose readings is especially helpful if you are wanting to look for patterns. Make notes to go along with the blood glucose readings because you might forget what happened at that exact time. Keeping good records helps you and your health care provider to work together to achieve good diabetes management.  Document Released: 10/02/2003 Document Revised: 02/13/2014 Document Reviewed: 02/21/2013 [facility] Patient Information 2015 [place], [place]. This information is not intended to replace advice given to you by your health care provider. Make sure you discuss any questions you have with your health care provider. Diabetes, Eating Away From Home Sometimes, you might eat in a restaurant or have meals that are prepared by someone else. You can enjoy eating out. However, the portions in restaurants may be much larger than needed. Listed below are some ideas to help you choose foods that will keep your blood glucose (sugar) in better control.  TIPS FOR EATING OUT  Know your meal plan and how many carbohydrate servings you should have at each meal. You may wish to carry a copy of your meal plan in your purse or wallet. Learn the foods included in each food group.  Make a list of restaurants near you that offer healthy choices. Take a copy of the carry-out menus to see what they offer. Then, you can plan what you will order ahead of time.  Become familiar with serving sizes by practicing them at home using measuring cups and spoons. Once you learn to recognize portion sizes, you will be able to correctly estimate the amount of total carbohydrate you are allowed to eat at the restaurant. Ask for a takeout box if the portion is more than you should have. When your food comes, leave the amount you should have on the plate, and put the rest in the takeout box before you start eating.  Plan ahead if your mealtime will be different from usual. Check  with your caregiver to find out how to time meals and medicine if you are taking insulin.  Avoid high-fat foods, such as fried foods, cream sauces, high-fat salad dressings, or any added butter or margarine.  Do not be afraid to ask questions. Ask your server about the portion size, cooking methods, ingredients and if items can be substituted. Restaurants do not list all available items on the menu. You can ask for your main entree to be prepared using skim milk, oil instead of butter or margarine, and without gravy or sauces. Ask your waiter or waitress to serve salad dressings, gravy, sauces, margarine, and sour cream on the side. You can then add the amount your meal plan suggests.  Add more vegetables whenever possible.  Avoid items that are labeled "jumbo," "giant," "deluxe," or "supersized."  You may want to split an entre with someone and order an extra side salad.  Watch for hidden  calories in foods like croutons, bacon, or cheese.  Ask your server to take away the bread basket or chips from your table.  Order a dinner salad as an appetizer. You can eat most foods served in a restaurant. Some foods are better choices than others. Breads and Starches  Recommended: All kinds of bread (wheat, rye, white, oatmeal, [place], [place], raisin), hard or soft dinner rolls, frankfurter or hamburger buns, small bagels, small corn or whole-wheat flour tortillas.  Avoid: Frosted or glazed breads, butter rolls, egg or cheese breads, croissants, sweet rolls, pastries, coffee cake, glazed or frosted doughnuts, muffins. Crackers  Recommended: Animal crackers, graham, rye, saltine, oyster, and matzoth crackers. Bread sticks, melba toast, rusks, pretzels, popcorn (without fat), zwieback toast.  Avoid: High-fat snack crackers or chips. Buttered popcorn. Cereals  Recommended: Hot and cold cereals. Whole grains such as oatmeal or shredded wheat are good choices.  Avoid: Sugar-coated or granola type  cereals. Potatoes/Pasta/Rice/Beans  Recommended: Order baked, boiled, or mashed potatoes, rice or noodles without added fat, whole beans. Order gravies, butter, margarine, or sauces on the side so you can control the amount you add.  Avoid: Hash browns or fried potatoes. Potatoes, pasta, or rice prepared with cream or cheese sauce. Potato or pasta salads prepared with large amounts of dressing. Fried beans or fried rice. Vegetables  Recommended: Order steamed, baked, boiled, or stewed vegetables without sauces or extra fat. Ask that sauce be served on the side. If vegetables are not listed on the menu, ask what is available.  Avoid: Vegetables prepared with cream, butter, or cheese sauce. Fried vegetables. Salad Bars  Recommended: Many of the vegetables at a salad bar are considered "free." Use lemon juice, vinegar, or low-calorie salad dressing (fewer than 20 calories per serving) as "free" dressings for your salad. Look for salad bar ingredients that have no added fat or sugar such as tomatoes, lettuce, cucumbers, broccoli, carrots, onions, and mushrooms.  Avoid: Prepared salads with large amounts of dressing, such as coleslaw, caesar salad, macaroni salad, bean salad, or carrot salad. Fruit  Recommended: Eat fresh fruit or fresh fruit salad without added dressing. A salad bar often offers fresh fruit choices, but canned fruit at a restaurant is usually packed in sugar or syrup.  Avoid: Sweetened canned or frozen fruits, plain or sweetened fruit juice. Fruit salads with dressing, sour cream, or sugar added to them. Meat and Meat Substitutes  Recommended: Order broiled, baked, roasted, or grilled meat, poultry, or fish. Trim off all visible fat. Do not eat the skin of poultry. The size stated on the menu is the raw weight. Meat shrinks by  in cooking (for example, 4 oz raw equals 3 oz cooked meat).  Avoid: Deep-fat fried meat, poultry, or fish. Breaded meats. Eggs  Recommended: Order  soft, hard-cooked, poached, or scrambled eggs. Omelets may be okay, depending on what ingredients are added. Egg substitutes are also a good choice.  Avoid: Fried eggs, eggs prepared with cream or cheese sauce. Milk  Recommended: Order low-fat or fat-free milk according to your meal plan. Plain, nonfat yogurt or flavored yogurt with no sugar added may be used as a substitute for milk. Soy milk may also be used.  Avoid: Milk shakes or sweetened milk beverages. Soups and Combination Foods  Recommended: Clear broth or consomm are "free" foods and may be used as an appetizer. Broth-based soups with fat removed count as a starch serving and are preferred over cream soups. Soups made with beans or split peas  may be eaten but count as a starch.  Avoid: Fatty soups, soup made with cream, cheese soup. Combination foods prepared with excessive amounts of fat or with cream or cheese sauces. Desserts and Sweets  Recommended: Ask for fresh fruit. Sponge or angel food cake without icing, ice milk, no sugar added ice cream, sherbet, or frozen yogurt may fit into your meal plan occasionally.  Avoid: Pastries, puddings, pies, cakes with icing, custard, gelatin desserts. Fats and Oils  Recommended: Choose healthy fats such as olive oil, canola oil, or tub margarine, reduced fat or fat-free sour cream, cream cheese, avocado, or nuts.  Avoid: Any fats in excess of your allowed portion. Deep-fried foods or any food with a large amount of fat. Note: Ask for all fats to be served on the side, and limit your portion sizes according to your meal plan. Document Released: 09/29/2005 Document Revised: 12/22/2011 Document Reviewed: 12/27/2013 [facility] Patient Information 2015 [place], [place]. This information is not intended to replace advice given to you by your health care provider. Make sure you discuss any questions you have with your health care provider. Basic Carbohydrate Counting for Diabetes  [place] Carbohydrate counting is a method for keeping track of the amount of carbohydrates you eat. Eating carbohydrates naturally increases the level of sugar (glucose) in your blood, so it is important for you to know the amount that is okay for you to have in every meal. Carbohydrate counting helps keep the level of glucose in your blood within normal limits. The amount of carbohydrates allowed is different for every person. A dietitian can help you calculate the amount that is right for you. Once you know the amount of carbohydrates you can have, you can count the carbohydrates in the foods you want to eat. Carbohydrates are found in the following foods:  Grains, such as breads and cereals.  Dried beans and soy products.  Starchy vegetables, such as potatoes, peas, and corn.  Fruit and fruit juices.  Milk and yogurt.  Sweets and snack foods, such as cake, cookies, candy, chips, soft drinks, and fruit drinks. CARBOHYDRATE COUNTING There are two ways to count the carbohydrates in your food. You can use either of the methods or a combination of both. Reading the "Nutrition Facts" on [place] The "Nutrition Facts" is an area that is included on the labels of almost all packaged food and beverages in the [place]. It includes the serving size of that food or beverage and information about the nutrients in each serving of the food, including the grams (g) of carbohydrate per serving.  Decide the number of servings of this food or beverage that you will be able to eat or drink. Multiply that number of servings by the number of grams of carbohydrate that is listed on the label for that serving. The total will be the amount of carbohydrates you will be having when you eat or drink this food or beverage. Learning Standard Serving Sizes of Food When you eat food that is not packaged or does not include "Nutrition Facts" on the label, you need to measure the servings in order to count the  amount of carbohydrates.A serving of most carbohydrate-rich foods contains about 15 g of carbohydrates. The following list includes serving sizes of carbohydrate-rich foods that provide 15 g ofcarbohydrate per serving:   1 slice of bread (1 oz) or 1 six-inch tortilla.    of a hamburger bun or English muffin.  4-6 crackers.   cup unsweetened dry cereal.  cup hot cereal.   cup rice or pasta.    cup mashed potatoes or  of a large baked potato.  1 cup fresh fruit or one small piece of fruit.    cup canned or frozen fruit or fruit juice.  1 cup milk.   cup plain fat-free yogurt or yogurt sweetened with artificial sweeteners.   cup cooked dried beans or starchy vegetable, such as peas, corn, or potatoes.  Decide the number of standard-size servings that you will eat. Multiply that number of servings by 15 (the grams of carbohydrates in that serving). For example, if you eat 2 cups of strawberries, you will have eaten 2 servings and 30 g of carbohydrates (2 servings x 15 g = 30 g). For foods such as soups and casseroles, in which more than one food is mixed in, you will need to count the carbohydrates in each food that is included. EXAMPLE OF CARBOHYDRATE COUNTING Sample Dinner  3 oz chicken breast.   cup of brown rice.   cup of corn.  1 cup milk.   1 cup strawberries with sugar-free whipped topping.  Carbohydrate Calculation Step 1: Identify the foods that contain carbohydrates:   Rice.   Corn.   Milk.   Strawberries. Step 2:Calculate the number of servings eaten of each:   2 servings of rice.   1 serving of corn.   1 serving of milk.   1 serving of strawberries. Step 3: Multiply each of those number of servings by 15 g:   2 servings of rice x 15 g = 30 g.   1 serving of corn x 15 g = 15 g.   1 serving of milk x 15 g = 15 g.   1 serving of strawberries x 15 g = 15 g. Step 4: Add together all of the amounts to find the total  grams of carbohydrates eaten: 30 g + 15 g + 15 g + 15 g = 75 g. Document Released: 09/29/2005 Document Revised: 02/13/2014 Document Reviewed: 08/26/2013 [facility] Patient Information 2015 [place], [place]. This information is not intended to replace advice given to you by your health care provider. Make sure you discuss any questions you have with your health care provider. How to Avoid Diabetes Problems You can do a lot to prevent or slow down diabetes problems. Following your diabetes plan and taking care of yourself can reduce your risk of serious or life-threatening complications. Below, you will find certain things you can do to prevent diabetes problems. MANAGE YOUR DIABETES Follow your health care provider's, nurse educator's, and dietitian's instructions for managing your diabetes. They will teach you the basics of diabetes care. They can help answer questions you may have. Learn about diabetes and make healthy choices regarding eating and physical activity. Monitor your blood glucose level regularly. Your health care provider will help you decide how often to check your blood glucose level depending on your treatment goals and how well you are meeting them.  DO NOT USE NICOTINE Nicotine and diabetes are a dangerous combination. Nicotine raises your risk for diabetes problems. If you quit using nicotine, you will lower your risk for heart attack, stroke, nerve disease, and kidney disease. Your cholesterol and your blood pressure levels may improve. Your blood circulation will also improve. Do not use any tobacco products, including cigarettes, chewing tobacco, or electronic cigarettes. If you need help quitting, ask your health care provider. KEEP YOUR BLOOD PRESSURE UNDER CONTROL Keeping your blood pressure under control will  help prevent damage to your eyes, kidneys, heart, and blood vessels. Blood pressure consists of two numbers. The top number should be below 120, and the bottom number should be  below 80 (120/80). Keep your blood pressure as close to these numbers as you can. If you already have kidney disease, you may want even lower blood pressure to protect your kidneys. Talk to your health care provider to make sure that your blood pressure goal is right for your needs. Meal planning, medicines, and exercise can help you reach your blood pressure target. Have your blood pressure checked at every visit with your health care provider. KEEP YOUR CHOLESTEROL UNDER CONTROL Normal cholesterol levels will help prevent heart disease and stroke. These are the biggest health problems for people with diabetes. Keeping cholesterol levels under control can also help with blood flow. Have your cholesterol level checked at least once a year. Your health care provider may prescribe a medicine known as a statin. Statins lower your cholesterol. If you are not taking a statin, ask your health care provider if you should be. Meal planning, exercise, and medicines can help you reach your cholesterol targets.  SCHEDULE AND KEEP YOUR ANNUAL PHYSICAL EXAMS AND EYE EXAMS Your health care provider will tell you how often he or she wants to see you depending on your plan of treatment. It is important that you keep these appointments so that possible problems can be identified early and complications can be avoided or treated.  Every visit with your health care provider should include your weight, blood pressure, and an evaluation of your blood glucose control.  Your hemoglobin A1c should be checked:  At least twice a year if you are at your goal.  Every 3 months if there are changes in treatment.  If you are not meeting your goals.  Your blood lipids should be checked yearly. You should also be checked yearly to see if you have protein in your urine (microalbumin).  Schedule a dilated eye exam within 5 years of your diagnosis if you have type 1 diabetes, and then yearly. Schedule a dilated eye exam at diagnosis  if you have type 2 diabetes, and then yearly. All exams thereafter can be extended to every 2 to 3 years if one or more exams have been normal. KEEP YOUR VACCINES CURRENT The flu vaccine is recommended yearly. The formula for the vaccine changes every year and needs to be updated for the best protection against current viruses. It is recommended that people with diabetes who are over 45 years old get the pneumonia vaccine. In some cases, two separate shots may be given. Ask your health care provider if your pneumonia vaccination is up-to-date. However, there are some instances where another vaccine is recommended. Check with your health care provider. TAKE CARE OF YOUR FEET  Diabetes may cause you to have a poor blood supply (circulation) to your legs and feet. Because of this, the skin may be thinner, break easier, and heal more slowly. You also may have nerve damage in your legs and feet, causing decreased feeling. You may not notice minor injuries to your feet that could lead to serious problems or infections. Taking care of your feet is very important. Visual foot exams are performed at every routine medical visit. The exams check for cuts, injuries, or other problems with the feet. A comprehensive foot exam should be done yearly. This includes visual inspection as well as assessing foot pulses and testing for loss of sensation. You  should also do the following:  Inspect your feet daily for cuts, calluses, blisters, ingrown toenails, and signs of infection, such as redness, swelling, or pus.  Wash and dry your feet thoroughly, especially between the toes.  Avoid soaking your feet regularly in hot water baths.  Moisturize dry skin with lotion, avoiding areas between your toes.  Cut toenails straight across and file the edges.  Avoid shoes that do not fit well or have areas that irritate your skin.  Avoid going barefooted or wearing only socks. Your feet need protection. TAKE CARE OF YOUR  TEETH People with poorly controlled diabetes are more likely to have gum (periodontal) disease. These infections make diabetes harder to control. Periodontal diseases, if left untreated, can lead to tooth loss. Brush your teeth twice a day, floss, and see your dentist for checkups and cleaning every 6 months, or 2 times a year. ASK YOUR HEALTH CARE PROVIDER ABOUT TAKING ASPIRIN Taking aspirin daily is recommended to help prevent cardiovascular disease in people with and without diabetes. Ask your health care provider if this would benefit you and what dose he or she would recommend. DRINK RESPONSIBLY Moderate amounts of alcohol (less than 1 drink per day for adult women and less than 2 drinks per day for adult men) have a minimal effect on blood glucose if ingested with food. It is important to eat food with alcohol to avoid hypoglycemia. People should avoid alcohol if they have a history of alcohol abuse or dependence, if they are pregnant, and if they have liver disease, pancreatitis, advanced neuropathy, or severe hypertriglyceridemia. LESSEN STRESS Living with diabetes can be stressful. When you are under stress, your blood glucose may be affected in two ways:  Stress hormones may cause your blood glucose to rise.  You may be distracted from taking good care of yourself. It is a good idea to be aware of your stress level and make changes that are necessary to help you better manage challenging situations. Support groups, planned relaxation, a hobby you enjoy, meditation, healthy relationships, and exercise all work to lower your stress level. If your efforts do not seem to be helping, get help from your health care provider or a trained mental health professional. Document Released: 06/17/2011 Document Revised: 02/13/2014 Document Reviewed: 11/23/2013 [facility] Patient Information 2015 [place], [place]. This information is not intended to replace advice given to you by your health care provider. Make  sure you discuss any questions you have with your health care provider. Type 2 Diabetes Mellitus Type 2 diabetes mellitus, often simply referred to as type 2 diabetes, is a long-lasting (chronic) disease. In type 2 diabetes, the pancreas does not make enough insulin (a hormone), the cells are less responsive to the insulin that is made (insulin resistance), or both. Normally, insulin moves sugars from food into the tissue cells. The tissue cells use the sugars for energy. The lack of insulin or the lack of normal response to insulin causes excess sugars to build up in the blood instead of going into the tissue cells. As a result, high blood sugar (hyperglycemia) develops. The effect of high sugar (glucose) levels can cause many complications. Type 2 diabetes was also previously called adult-onset diabetes, but it can occur at any age.  RISK FACTORS  A person is predisposed to developing type 2 diabetes if someone in the family has the disease and also has one or more of the following primary risk factors:  Overweight.  An inactive lifestyle.  A history of  consistently eating high-calorie foods. Maintaining a normal weight and regular physical activity can reduce the chance of developing type 2 diabetes. SYMPTOMS  A person with type 2 diabetes may not show symptoms initially. The symptoms of type 2 diabetes appear slowly. The symptoms include:  Increased thirst (polydipsia).  Increased urination (polyuria).  Increased urination during the night (nocturia).  Weight loss. This weight loss may be rapid.  Frequent, recurring infections.  Tiredness (fatigue).  Weakness.  Vision changes, such as blurred vision.  Fruity smell to your breath.  Abdominal pain.  Nausea or vomiting.  Cuts or bruises which are slow to heal.  Tingling or numbness in the hands or feet. DIAGNOSIS Type 2 diabetes is frequently not diagnosed until complications of diabetes are present. Type 2 diabetes is  diagnosed when symptoms or complications are present and when blood glucose levels are increased. Your blood glucose level may be checked by one or more of the following blood tests:  A fasting blood glucose test. You will not be allowed to eat for at least 8 hours before a blood sample is taken.  A random blood glucose test. Your blood glucose is checked at any time of the day regardless of when you ate.  A hemoglobin A1c blood glucose test. A hemoglobin A1c test provides information about blood glucose control over the previous 3 months.  An oral glucose tolerance test (OGTT). Your blood glucose is measured after you have not eaten (fasted) for 2 hours and then after you drink a glucose-containing beverage. TREATMENT   You may need to take insulin or diabetes medicine daily to keep blood glucose levels in the desired range.  If you use insulin, you may need to adjust the dosage depending on the carbohydrates that you eat with each meal or snack. The treatment goal is to maintain the before meal blood sugar (preprandial glucose) level at 70-130 mg/dL. HOME CARE INSTRUCTIONS   Have your hemoglobin A1c level checked twice a year.  Perform daily blood glucose monitoring as directed by your health care provider.  Monitor urine ketones when you are ill and as directed by your health care provider.  Take your diabetes medicine or insulin as directed by your health care provider to maintain your blood glucose levels in the desired range.  Never run out of diabetes medicine or insulin. It is needed every day.  If you are using insulin, you may need to adjust the amount of insulin given based on your intake of carbohydrates. Carbohydrates can raise blood glucose levels but need to be included in your diet. Carbohydrates provide vitamins, minerals, and fiber which are an essential part of a healthy diet. Carbohydrates are found in fruits, vegetables, whole grains, dairy products, legumes, and foods  containing added sugars.  Eat healthy foods. You should make an appointment to see a registered dietitian to help you create an eating plan that is right for you.  Lose weight if you are overweight.  Carry a medical alert card or wear your medical alert jewelry.  Carry a 15-gram carbohydrate snack with you at all times to treat low blood glucose (hypoglycemia). Some examples of 15-gram carbohydrate snacks include:  Glucose tablets, 3 or 4.  Glucose gel, 15-gram tube.  Raisins, 2 tablespoons (24 grams).  Jelly beans, 6.  Animal crackers, 8.  Regular pop, 4 ounces (120 mL).  Gummy treats, 9.  Recognize hypoglycemia. Hypoglycemia occurs with blood glucose levels of 70 mg/dL and below. The risk for hypoglycemia increases when fasting  or skipping meals, during or after intense exercise, and during sleep. Hypoglycemia symptoms can include:  Tremors or shakes.  Decreased ability to concentrate.  Sweating.  Increased heart rate.  Headache.  Dry mouth.  Hunger.  Irritability.  Anxiety.  Restless sleep.  Altered speech or coordination.  Confusion.  Treat hypoglycemia promptly. If you are alert and able to safely swallow, follow the 15:15 rule:  Take 15-20 grams of rapid-acting glucose or carbohydrate. Rapid-acting options include glucose gel, glucose tablets, or 4 ounces (120 mL) of fruit juice, regular soda, or low-fat milk.  Check your blood glucose level 15 minutes after taking the glucose.  Take 15-20 grams more of glucose if the repeat blood glucose level is still 70 mg/dL or below.  Eat a meal or snack within 1 hour once blood glucose levels return to normal.  Be alert to feeling very thirsty and urinating more frequently than usual, which are early signs of hyperglycemia. An early awareness of hyperglycemia allows for prompt treatment. Treat hyperglycemia as directed by your health care provider.  Engage in at least 150 minutes of moderate-intensity  physical activity a week, spread over at least 3 days of the week or as directed by your health care provider. In addition, you should engage in resistance exercise at least 2 times a week or as directed by your health care provider. Try to spend no more than 90 minutes at one time inactive.  Adjust your medicine and food intake as needed if you start a new exercise or sport.  Follow your sick-day plan anytime you are unable to eat or drink as usual.  Do not use any tobacco products including cigarettes, chewing tobacco, or electronic cigarettes. If you need help quitting, ask your health care provider.  Limit alcohol intake to no more than 1 drink per day for nonpregnant women and 2 drinks per day for men. You should drink alcohol only when you are also eating food. Talk with your health care provider whether alcohol is safe for you. Tell your health care provider if you drink alcohol several times a week.  Keep all follow-up visits as directed by your health care provider. This is important.  Schedule an eye exam soon after the diagnosis of type 2 diabetes and then annually.  Perform daily skin and foot care. Examine your skin and feet daily for cuts, bruises, redness, nail problems, bleeding, blisters, or sores. A foot exam by a health care provider should be done annually.  Brush your teeth and gums at least twice a day and floss at least once a day. Follow up with your dentist regularly.  Share your diabetes management plan with your workplace or school.  Stay up-to-date with immunizations. It is recommended that people with diabetes who are over 48 years old get the pneumonia vaccine. In some cases, two separate shots may be given. Ask your health care provider if your pneumonia vaccination is up-to-date.  Learn to manage stress.  Obtain ongoing diabetes education and support as needed.  Participate in or seek rehabilitation as needed to maintain or improve independence and quality  of life. Request a physical or occupational therapy referral if you are having foot or hand numbness, or difficulties with grooming, dressing, eating, or physical activity. SEEK MEDICAL CARE IF:   You are unable to eat food or drink fluids for more than 6 hours.  You have nausea and vomiting for more than 6 hours.  Your blood glucose level is over 240 mg/dL.  There is a change in mental status.  You develop an additional serious illness.  You have diarrhea for more than 6 hours.  You have been sick or have had a fever for a couple of days and are not getting better.  You have pain during any physical activity.  SEEK IMMEDIATE MEDICAL CARE IF:  You have difficulty breathing.  You have moderate to large ketone levels. MAKE SURE YOU:  Understand these instructions.  Will watch your condition.  Will get help right away if you are not doing well or get worse. Document Released: 09/29/2005 Document Revised: 02/13/2014 Document Reviewed: 04/27/2012 [facility] Patient Information 2015 [place], [place]. This information is not intended to replace advice given to you by your health care provider. Make sure you discuss any questions you have with your health care provider.

## 2014-08-26 NOTE — Progress Notes (Signed)
UR completed 

## 2014-09-22 ENCOUNTER — Ambulatory Visit: Payer: Self-pay | Admitting: Family

## 2014-09-22 ENCOUNTER — Encounter: Payer: Self-pay | Admitting: Physician Assistant

## 2014-09-22 ENCOUNTER — Ambulatory Visit (INDEPENDENT_AMBULATORY_CARE_PROVIDER_SITE_OTHER): Payer: BC Managed Care – PPO | Admitting: Physician Assistant

## 2014-09-22 VITALS — Ht 71.0 in | Wt >= 6400 oz

## 2014-09-22 DIAGNOSIS — E1165 Type 2 diabetes mellitus with hyperglycemia: Secondary | ICD-10-CM

## 2014-09-22 DIAGNOSIS — IMO0002 Reserved for concepts with insufficient information to code with codable children: Secondary | ICD-10-CM

## 2014-09-22 DIAGNOSIS — K851 Biliary acute pancreatitis without necrosis or infection: Secondary | ICD-10-CM

## 2014-09-22 DIAGNOSIS — K219 Gastro-esophageal reflux disease without esophagitis: Secondary | ICD-10-CM

## 2014-09-22 DIAGNOSIS — I10 Essential (primary) hypertension: Secondary | ICD-10-CM

## 2014-09-22 LAB — LIPID PANEL
CHOL/HDL RATIO: 5
Cholesterol: 166 mg/dL (ref 0–200)
HDL: 36.7 mg/dL — AB (ref >39.00)
NONHDL: 129.3
TRIGLYCERIDES: 201 mg/dL — AB (ref 0.0–149.0)
VLDL: 40.2 mg/dL — AB (ref 0.0–40.0)

## 2014-09-22 LAB — COMPREHENSIVE METABOLIC PANEL
ALK PHOS: 68 U/L (ref 39–117)
ALT: 20 U/L (ref 0–35)
AST: 19 U/L (ref 0–37)
Albumin: 3.9 g/dL (ref 3.5–5.2)
BILIRUBIN TOTAL: 0.4 mg/dL (ref 0.2–1.2)
BUN: 9 mg/dL (ref 6–23)
CO2: 26 mEq/L (ref 19–32)
CREATININE: 0.8 mg/dL (ref 0.4–1.2)
Calcium: 9.6 mg/dL (ref 8.4–10.5)
Chloride: 104 mEq/L (ref 96–112)
GFR: 106.34 mL/min (ref >60.00)
Glucose, Bld: 115 mg/dL — ABNORMAL HIGH (ref 70–99)
Potassium: 4 mEq/L (ref 3.5–5.1)
Sodium: 135 mEq/L (ref 135–145)
Total Protein: 7 g/dL (ref 6.0–8.3)

## 2014-09-22 LAB — URINALYSIS, ROUTINE W REFLEX MICROSCOPIC
BILIRUBIN URINE: NEGATIVE
HGB URINE DIPSTICK: NEGATIVE
KETONES UR: NEGATIVE
LEUKOCYTES UA: NEGATIVE
Nitrite: NEGATIVE
Specific Gravity, Urine: 1.02 (ref 1.000–1.030)
Total Protein, Urine: NEGATIVE
URINE GLUCOSE: NEGATIVE
UROBILINOGEN UA: 0.2 (ref 0.0–1.0)
pH: 5.5 (ref 5.0–8.0)

## 2014-09-22 LAB — CBC
HCT: 37.5 % (ref 36.0–46.0)
HEMOGLOBIN: 12.1 g/dL (ref 12.0–15.0)
MCHC: 32.2 g/dL (ref 30.0–36.0)
MCV: 84.4 fl (ref 78.0–100.0)
PLATELETS: 315 10*3/uL (ref 150.0–400.0)
RBC: 4.44 Mil/uL (ref 3.87–5.11)
RDW: 15.6 % — ABNORMAL HIGH (ref 11.5–15.5)
WBC: 8.4 10*3/uL (ref 4.0–10.5)

## 2014-09-22 LAB — LDL CHOLESTEROL, DIRECT: LDL DIRECT: 87.5 mg/dL

## 2014-09-22 MED ORDER — OMEPRAZOLE 20 MG PO CPDR
20.0000 mg | DELAYED_RELEASE_CAPSULE | Freq: Every day | ORAL | Status: DC
Start: 2014-09-22 — End: 2014-11-02

## 2014-09-22 NOTE — Progress Notes (Signed)
Pre visit review using our clinic review tool, if applicable. No additional management support is needed unless otherwise documented below in the visit note. 

## 2014-09-22 NOTE — Patient Instructions (Signed)
Please continue medications as directed.  Please stop by the lab for blood work.  I will call your results.  We will alter medications if indicated by results.  You will be contacted by Nutrition for a consultation.  Please follow-up with me in 1 month for a Complete Physical.  Diabetes and Foot Care Diabetes may cause you to have problems because of poor blood supply (circulation) to your feet and legs. This may cause the skin on your feet to become thinner, break easier, and heal more slowly. Your skin may become dry, and the skin may peel and crack. You may also have nerve damage in your legs and feet causing decreased feeling in them. You may not notice minor injuries to your feet that could lead to infections or more serious problems. Taking care of your feet is one of the most important things you can do for yourself.  HOME CARE INSTRUCTIONS  Wear shoes at all times, even in the house. Do not go barefoot. Bare feet are easily injured.  Check your feet daily for blisters, cuts, and redness. If you cannot see the bottom of your feet, use a mirror or ask someone for help.  Wash your feet with warm water (do not use hot water) and mild soap. Then pat your feet and the areas between your toes until they are completely dry. Do not soak your feet as this can dry your skin.  Apply a moisturizing lotion or petroleum jelly (that does not contain alcohol and is unscented) to the skin on your feet and to dry, brittle toenails. Do not apply lotion between your toes.  Trim your toenails straight across. Do not dig under them or around the cuticle. File the edges of your nails with an emery board or nail file.  Do not cut corns or calluses or try to remove them with medicine.  Wear clean socks or stockings every day. Make sure they are not too tight. Do not wear knee-high stockings since they may decrease blood flow to your legs.  Wear shoes that fit properly and have enough cushioning. To break in new  shoes, wear them for just a few hours a day. This prevents you from injuring your feet. Always look in your shoes before you put them on to be sure there are no objects inside.  Do not cross your legs. This may decrease the blood flow to your feet.  If you find a minor scrape, cut, or break in the skin on your feet, keep it and the skin around it clean and dry. These areas may be cleansed with mild soap and water. Do not cleanse the area with peroxide, alcohol, or iodine.  When you remove an adhesive bandage, be sure not to damage the skin around it.  If you have a wound, look at it several times a day to make sure it is healing.  Do not use heating pads or hot water bottles. They may burn your skin. If you have lost feeling in your feet or legs, you may not know it is happening until it is too late.  Make sure your health care provider performs a complete foot exam at least annually or more often if you have foot problems. Report any cuts, sores, or bruises to your health care provider immediately. SEEK MEDICAL CARE IF:   You have an injury that is not healing.  You have cuts or breaks in the skin.  You have an ingrown nail.  You notice  redness on your legs or feet.  You feel burning or tingling in your legs or feet.  You have pain or cramps in your legs and feet.  Your legs or feet are numb.  Your feet always feel cold. SEEK IMMEDIATE MEDICAL CARE IF:   There is increasing redness, swelling, or pain in or around a wound.  There is a red line that goes up your leg.  Pus is coming from a wound.  You develop a fever or as directed by your health care provider.  You notice a bad smell coming from an ulcer or wound. Document Released: 09/26/2000 Document Revised: 06/01/2013 Document Reviewed: 03/08/2013 [facility] Patient Information 2015 [place], [place]. This information is not intended to replace advice given to you by your health care provider. Make sure you discuss any  questions you have with your health care provider.

## 2014-09-22 NOTE — Progress Notes (Signed)
Patient presents to clinic today to establish care.   Diabetes Mellitus II, New onset -- Patient diagnosed at an ER visit.  Started on Metformin 500 mg BID. Also given diabetic testing supplies but has not been checking her glucose levels often. When she has checked fasting glucose, she states it is around 120-130.  Hypertension -- Currently on Prinzide and Carvedilol daily.  BP at 146/80 in clinic today.  Denies chest pain, palpitations, lightheadedness or dizziness.  GERD -- Endorses daily acid reflux symptoms.  Denies abdominal pain, nausea or vomiting.  Symptoms not controlled with Zantac.  Morbid Obesity -- Affecting health substantially.  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 Is considering bariatric surgery.  Is watching diet.  Body mass index is 57.21 kg/(m^2).  Health Maintenance: Dental -- up-to-date Vision -- up-to-date PAP -- Up-to-date.  No abnormal findings.  Followed by OB/GYN -- Dr. Charlesetta Garibaldi  Past Medical History  Diagnosis Date  . Hypertension   . GERD (gastroesophageal reflux disease)   . Abdominal pain     related to gallstones  . Gallstones   . Sinus congestion     occ. uses OTC meds for this  . Sleep apnea 06-15-13    Was told test nonconclusive, unable to sleep and remain on back..Stop/Bang score =4  . Pancreatitis   . DKA (diabetic ketoacidoses) 08/25/2014  . Diabetes     Past Surgical History  Procedure Laterality Date  . Tonsillectomy    . Cholecystectomy N/A 06/22/2013    Procedure: LAPAROSCOPIC CHOLECYSTECTOMY ;  Surgeon: Adin Hector, MD;  Location: [facility];  Service: General;  Laterality: N/A;    Current Outpatient Prescriptions on File Prior to Visit  Medication Sig Dispense Refill  . Blood Glucose Monitoring Suppl (BLOOD GLUCOSE METER KIT AND SUPPLIES) Dispense based on patient and insurance preference. Use up to four times daily as directed. (FOR ICD-9 250.00, 250.01). 1 each 0  . carvedilol (COREG) 12.5 MG tablet Take 12.5 mg by mouth 2  (two) times daily with a meal.     . ibuprofen (ADVIL,MOTRIN) 200 MG tablet Take 400 mg by mouth daily as needed (body aches/pain).    Marland Kitchen lisinopril-hydrochlorothiazide (PRINZIDE,ZESTORETIC) 20-25 MG per tablet Take 1 tablet by mouth daily.     . metFORMIN (GLUCOPHAGE) 500 MG tablet Take 1 tablet (500 mg total) by mouth 2 (two) times daily with a meal. 60 tablet 1  . Multiple Vitamin (MULTIVITAMIN WITH MINERALS) TABS tablet Take 1 tablet by mouth daily.    Vladimir Faster Glycol-Propyl Glycol (SYSTANE OP) Place 1 drop into both eyes daily as needed (dry eyes).     No current facility-administered medications on file prior to visit.    No Known Allergies  Family History  Problem Relation Age of Onset  . Hypertension Mother   . Diabetes Mother   . Hypertension Father   . Diabetes Father   . Stroke Paternal Grandfather   . Lung cancer Paternal Grandmother     History   Social History  . Marital Status: Single    Spouse Name: N/A    Number of Children: 0  . Years of Education: N/A   Occupational History  . research/scientist    Social History Main Topics  . Smoking status: Never Smoker   . Smokeless tobacco: Never Used  . Alcohol Use: Yes     Comment: 1 drink per month  . Drug Use: No  . Sexual Activity: No   Other Topics Concern  . Not on file  Social History Narrative   ROS Pertinent ROS are listed in HPI.  Ht $R'5\' 11"'SO$  (1.803 m)  Wt 410 lb (185.975 kg)  BMI 57.21 kg/m2  LMP 08/12/2014  Physical Exam  Constitutional: She is oriented to person, place, and time and well-developed, well-nourished, and in no distress.  HENT:  Head: Normocephalic and atraumatic.  Eyes: Conjunctivae are normal.  Neck: Neck supple. No thyromegaly present.  Cardiovascular: Normal rate, regular rhythm, normal heart sounds and intact distal pulses.   Pulmonary/Chest: Effort normal and breath sounds normal. No respiratory distress. She has no wheezes. She has no rales. She exhibits no  tenderness.  Abdominal: She exhibits no distension and no mass. There is no tenderness. There is no rebound and no guarding.  Neurological: She is alert and oriented to person, place, and time. No cranial nerve deficit.  Skin: Skin is warm and dry. No rash noted.  Psychiatric: Affect normal.  Vitals reviewed.   Recent Results (from the past 2160 hour(s))  CBG monitoring, ED     Status: Abnormal   Collection Time: 08/25/14  8:49 AM  Result Value Ref Range   Glucose-Capillary 407 (H) 70 - 99 mg/dL  Urinalysis, Routine w reflex microscopic     Status: Abnormal   Collection Time: 08/25/14  8:50 AM  Result Value Ref Range   Color, Urine YELLOW YELLOW   APPearance CLEAR CLEAR   Specific Gravity, Urine 1.033 (H) 1.005 - 1.030   pH 5.5 5.0 - 8.0   Glucose, UA >1000 (A) NEGATIVE mg/dL   Hgb urine dipstick NEGATIVE NEGATIVE   Bilirubin Urine NEGATIVE NEGATIVE   Ketones, ur >80 (A) NEGATIVE mg/dL   Protein, ur NEGATIVE NEGATIVE mg/dL   Urobilinogen, UA 0.2 0.0 - 1.0 mg/dL   Nitrite NEGATIVE NEGATIVE   Leukocytes, UA NEGATIVE NEGATIVE  Pregnancy, urine     Status: None   Collection Time: 08/25/14  8:50 AM  Result Value Ref Range   Preg Test, Ur NEGATIVE NEGATIVE    Comment:        THE SENSITIVITY OF THIS METHODOLOGY IS >20 mIU/mL.   Urine microscopic-add on     Status: None   Collection Time: 08/25/14  8:50 AM  Result Value Ref Range   Squamous Epithelial / LPF RARE RARE   WBC, UA 0-2 <3 WBC/hpf   RBC / HPF 0-2 <3 RBC/hpf   Bacteria, UA RARE RARE  CBC with Differential     Status: None   Collection Time: 08/25/14  9:20 AM  Result Value Ref Range   WBC 8.0 4.0 - 10.5 K/uL    Comment: WHITE COUNT CONFIRMED ON SMEAR   RBC 4.92 3.87 - 5.11 MIL/uL   Hemoglobin 13.5 12.0 - 15.0 g/dL   HCT 40.6 36.0 - 46.0 %   MCV 82.5 78.0 - 100.0 fL   MCH 27.4 26.0 - 34.0 pg   MCHC 33.3 30.0 - 36.0 g/dL   RDW 14.2 11.5 - 15.5 %   Platelets  150 - 400 K/uL    PLATELET CLUMPS NOTED ON SMEAR,  COUNT APPEARS ADEQUATE   Neutrophils Relative % 52 43 - 77 %   Lymphocytes Relative 45 12 - 46 %   Monocytes Relative 3 3 - 12 %   Eosinophils Relative 0 0 - 5 %   Basophils Relative 0 0 - 1 %   Band Neutrophils 0 0 - 10 %   Metamyelocytes Relative 0 %   Myelocytes 0 %   Promyelocytes Absolute 0 %  Blasts 0 %   nRBC 0 0 /100 WBC   Neutro Abs 4.2 1.7 - 7.7 K/uL   Lymphs Abs 3.6 0.7 - 4.0 K/uL   Monocytes Absolute 0.2 0.1 - 1.0 K/uL   Eosinophils Absolute 0.0 0.0 - 0.7 K/uL   Basophils Absolute 0.0 0.0 - 0.1 K/uL   WBC Morphology ATYPICAL LYMPHOCYTES     Comment: VACUOLATED NEUTROPHILS   Smear Review      PLATELET CLUMPS NOTED ON SMEAR, COUNT APPEARS ADEQUATE  Comprehensive metabolic panel     Status: Abnormal   Collection Time: 08/25/14  9:20 AM  Result Value Ref Range   Sodium 133 (L) 137 - 147 mEq/L   Potassium 4.2 3.7 - 5.3 mEq/L   Chloride 94 (L) 96 - 112 mEq/L   CO2 19 19 - 32 mEq/L   Glucose, Bld 421 (H) 70 - 99 mg/dL   BUN 14 6 - 23 mg/dL   Creatinine, Ser 0.80 0.50 - 1.10 mg/dL   Calcium 10.4 8.4 - 10.5 mg/dL   Total Protein 7.9 6.0 - 8.3 g/dL   Albumin 4.3 3.5 - 5.2 g/dL   AST 24 0 - 37 U/L    Comment: HEMOLYSIS AT THIS LEVEL MAY AFFECT RESULT   ALT 21 0 - 35 U/L   Alkaline Phosphatase 110 39 - 117 U/L   Total Bilirubin 0.4 0.3 - 1.2 mg/dL   GFR calc non Af Amer >90 >90 mL/min   GFR calc Af Amer >90 >90 mL/min    Comment: (NOTE) The eGFR has been calculated using the CKD EPI equation. This calculation has not been validated in all clinical situations. eGFR's persistently <90 mL/min signify possible Chronic Kidney Disease.    Anion gap 20 (H) 5 - 15  Lipase, blood     Status: None   Collection Time: 08/25/14  9:20 AM  Result Value Ref Range   Lipase 42 11 - 59 U/L  Ketones, qualitative     Status: Abnormal   Collection Time: 08/25/14  9:20 AM  Result Value Ref Range   Acetone, Bld SMALL (A) NEGATIVE  Hemoglobin A1c     Status: Abnormal   Collection  Time: 08/25/14  9:20 AM  Result Value Ref Range   Hgb A1c MFr Bld 10.7 (H) <5.7 %    Comment: (NOTE)                                                                       According to the ADA Clinical Practice Recommendations for 2011, when HbA1c is used as a screening test:  >=6.5%   Diagnostic of Diabetes Mellitus           (if abnormal result is confirmed) 5.7-6.4%   Increased risk of developing Diabetes Mellitus References:Diagnosis and Classification of Diabetes Mellitus,Diabetes MAUQ,3335,45(GYBWL 1):S62-S69 and Standards of Medical Care in         Diabetes - 2011,Diabetes Care,2011,34 (Suppl 1):S11-S61.    Mean Plasma Glucose 260 (H) <117 mg/dL    Comment: Performed at [facility] venous blood gas, ED     Status: Abnormal   Collection Time: 08/25/14  9:26 AM  Result Value Ref Range   pH, Ven 7.449 (H) 7.250 - 7.300  pCO2, Ven 31.6 (L) 45.0 - 50.0 mmHg   pO2, Ven 70.0 (H) 30.0 - 45.0 mmHg   Bicarbonate 22.0 20.0 - 24.0 mEq/L   TCO2 23 0 - 100 mmol/L   O2 Saturation 95.0 %   Acid-base deficit 1.0 0.0 - 2.0 mmol/L   Sample type VENOUS   POC CBG, ED     Status: Abnormal   Collection Time: 08/25/14 10:20 AM  Result Value Ref Range   Glucose-Capillary 314 (H) 70 - 99 mg/dL  POC CBG, ED     Status: Abnormal   Collection Time: 08/25/14 11:35 AM  Result Value Ref Range   Glucose-Capillary 288 (H) 70 - 99 mg/dL  CBG monitoring, ED     Status: Abnormal   Collection Time: 08/25/14 12:43 PM  Result Value Ref Range   Glucose-Capillary 265 (H) 70 - 99 mg/dL  POC CBG, ED     Status: Abnormal   Collection Time: 08/25/14  1:45 PM  Result Value Ref Range   Glucose-Capillary 192 (H) 70 - 99 mg/dL   Comment 1 Notify RN   CBG monitoring, ED     Status: Abnormal   Collection Time: 08/25/14  2:57 PM  Result Value Ref Range   Glucose-Capillary 198 (H) 70 - 99 mg/dL  Glucose, capillary     Status: Abnormal   Collection Time: 08/25/14  4:57 PM  Result Value Ref Range     Glucose-Capillary 148 (H) 70 - 99 mg/dL  Glucose, capillary     Status: Abnormal   Collection Time: 08/25/14  6:04 PM  Result Value Ref Range   Glucose-Capillary 141 (H) 70 - 99 mg/dL  MRSA PCR Screening     Status: None   Collection Time: 08/25/14  6:40 PM  Result Value Ref Range   MRSA by PCR NEGATIVE NEGATIVE    Comment:        The GeneXpert MRSA Assay (FDA approved for NASAL specimens only), is one component of a comprehensive MRSA colonization surveillance program. It is not intended to diagnose MRSA infection nor to guide or monitor treatment for MRSA infections.   CBC     Status: None   Collection Time: 08/25/14  6:49 PM  Result Value Ref Range   WBC 7.3 4.0 - 10.5 K/uL   RBC 4.64 3.87 - 5.11 MIL/uL   Hemoglobin 12.4 12.0 - 15.0 g/dL   HCT 81.4 02.5 - 92.7 %   MCV 81.7 78.0 - 100.0 fL   MCH 26.7 26.0 - 34.0 pg   MCHC 32.7 30.0 - 36.0 g/dL   RDW 01.7 84.3 - 33.2 %   Platelets 236 150 - 400 K/uL  Basic metabolic panel (timed)     Status: Abnormal   Collection Time: 08/25/14  6:49 PM  Result Value Ref Range   Sodium 140 137 - 147 mEq/L   Potassium 3.7 3.7 - 5.3 mEq/L    Comment: HEMOLYSIS AT THIS LEVEL MAY AFFECT RESULT   Chloride 100 96 - 112 mEq/L   CO2 22 19 - 32 mEq/L   Glucose, Bld 142 (H) 70 - 99 mg/dL   BUN 10 6 - 23 mg/dL   Creatinine, Ser 7.45 0.50 - 1.10 mg/dL   Calcium 9.6 8.4 - 79.3 mg/dL   GFR calc non Af Amer >90 >90 mL/min   GFR calc Af Amer >90 >90 mL/min    Comment: (NOTE) The eGFR has been calculated using the CKD EPI equation. This calculation has not been validated in all clinical  situations. eGFR's persistently <90 mL/min signify possible Chronic Kidney Disease.    Anion gap 18 (H) 5 - 15  Glucose, capillary     Status: Abnormal   Collection Time: 08/25/14  7:17 PM  Result Value Ref Range   Glucose-Capillary 132 (H) 70 - 99 mg/dL  Glucose, capillary     Status: Abnormal   Collection Time: 08/25/14  8:20 PM  Result Value Ref Range    Glucose-Capillary 139 (H) 70 - 99 mg/dL  Basic metabolic panel (timed)     Status: Abnormal   Collection Time: 08/25/14  9:24 PM  Result Value Ref Range   Sodium 139 137 - 147 mEq/L   Potassium 3.6 (L) 3.7 - 5.3 mEq/L    Comment: SLIGHT HEMOLYSIS   Chloride 99 96 - 112 mEq/L   CO2 21 19 - 32 mEq/L   Glucose, Bld 168 (H) 70 - 99 mg/dL   BUN 9 6 - 23 mg/dL   Creatinine, Ser 0.70 0.50 - 1.10 mg/dL   Calcium 9.4 8.4 - 10.5 mg/dL   GFR calc non Af Amer >90 >90 mL/min   GFR calc Af Amer >90 >90 mL/min    Comment: (NOTE) The eGFR has been calculated using the CKD EPI equation. This calculation has not been validated in all clinical situations. eGFR's persistently <90 mL/min signify possible Chronic Kidney Disease.    Anion gap 19 (H) 5 - 15  Glucose, capillary     Status: Abnormal   Collection Time: 08/25/14 10:20 PM  Result Value Ref Range   Glucose-Capillary 169 (H) 70 - 99 mg/dL  Glucose, capillary     Status: Abnormal   Collection Time: 08/25/14 11:37 PM  Result Value Ref Range   Glucose-Capillary 186 (H) 70 - 99 mg/dL  Glucose, capillary     Status: Abnormal   Collection Time: 08/26/14 12:33 AM  Result Value Ref Range   Glucose-Capillary 162 (H) 70 - 99 mg/dL  Glucose, capillary     Status: Abnormal   Collection Time: 08/26/14  1:35 AM  Result Value Ref Range   Glucose-Capillary 170 (H) 70 - 99 mg/dL  Glucose, capillary     Status: Abnormal   Collection Time: 08/26/14  2:39 AM  Result Value Ref Range   Glucose-Capillary 178 (H) 70 - 99 mg/dL  Basic metabolic panel (timed)     Status: Abnormal   Collection Time: 08/26/14  5:15 AM  Result Value Ref Range   Sodium 137 137 - 147 mEq/L   Potassium 4.0 3.7 - 5.3 mEq/L    Comment: HEMOLYSIS AT THIS LEVEL MAY AFFECT RESULT   Chloride 97 96 - 112 mEq/L   CO2 23 19 - 32 mEq/L   Glucose, Bld 183 (H) 70 - 99 mg/dL   BUN 8 6 - 23 mg/dL   Creatinine, Ser 0.75 0.50 - 1.10 mg/dL   Calcium 9.0 8.4 - 10.5 mg/dL   GFR calc non Af  Amer >90 >90 mL/min   GFR calc Af Amer >90 >90 mL/min    Comment: (NOTE) The eGFR has been calculated using the CKD EPI equation. This calculation has not been validated in all clinical situations. eGFR's persistently <90 mL/min signify possible Chronic Kidney Disease.    Anion gap 17 (H) 5 - 15  Glucose, capillary     Status: Abnormal   Collection Time: 08/26/14  6:01 AM  Result Value Ref Range   Glucose-Capillary 191 (H) 70 - 99 mg/dL  Glucose, capillary  Status: Abnormal   Collection Time: 08/26/14  7:04 AM  Result Value Ref Range   Glucose-Capillary 191 (H) 70 - 99 mg/dL  Glucose, capillary     Status: Abnormal   Collection Time: 08/26/14  8:22 AM  Result Value Ref Range   Glucose-Capillary 177 (H) 70 - 99 mg/dL  Glucose, capillary     Status: Abnormal   Collection Time: 08/26/14  9:30 AM  Result Value Ref Range   Glucose-Capillary 163 (H) 70 - 99 mg/dL   Comment 1 Notify RN   Glucose, capillary     Status: Abnormal   Collection Time: 08/26/14 10:50 AM  Result Value Ref Range   Glucose-Capillary 152 (H) 70 - 99 mg/dL  Glucose, capillary     Status: Abnormal   Collection Time: 08/26/14  2:15 PM  Result Value Ref Range   Glucose-Capillary 328 (H) 70 - 99 mg/dL  Glucose, capillary     Status: Abnormal   Collection Time: 08/26/14  3:52 PM  Result Value Ref Range   Glucose-Capillary 324 (H) 70 - 99 mg/dL   Comment 1 Notify RN   CBC     Status: Abnormal   Collection Time: 09/22/14  8:59 AM  Result Value Ref Range   WBC 8.4 4.0 - 10.5 K/uL   RBC 4.44 3.87 - 5.11 Mil/uL   Platelets 315.0 150.0 - 400.0 K/uL   Hemoglobin 12.1 12.0 - 15.0 g/dL   HCT 37.5 36.0 - 46.0 %   MCV 84.4 78.0 - 100.0 fl   MCHC 32.2 30.0 - 36.0 g/dL   RDW 15.6 (H) 11.5 - 15.5 %  Comp Met (CMET)     Status: Abnormal   Collection Time: 09/22/14  8:59 AM  Result Value Ref Range   Sodium 135 135 - 145 mEq/L   Potassium 4.0 3.5 - 5.1 mEq/L   Chloride 104 96 - 112 mEq/L   CO2 26 19 - 32 mEq/L    Glucose, Bld 115 (H) 70 - 99 mg/dL   BUN 9 6 - 23 mg/dL   Creatinine, Ser 0.8 0.4 - 1.2 mg/dL   Total Bilirubin 0.4 0.2 - 1.2 mg/dL   Alkaline Phosphatase 68 39 - 117 U/L   AST 19 0 - 37 U/L   ALT 20 0 - 35 U/L   Total Protein 7.0 6.0 - 8.3 g/dL   Albumin 3.9 3.5 - 5.2 g/dL   Calcium 9.6 8.4 - 10.5 mg/dL   GFR 106.34 >60.00 mL/min  Lipid Profile     Status: Abnormal   Collection Time: 09/22/14  8:59 AM  Result Value Ref Range   Cholesterol 166 0 - 200 mg/dL    Comment: ATP III Classification       Desirable:  < 200 mg/dL               Borderline High:  200 - 239 mg/dL          High:  > = 240 mg/dL   Triglycerides 201.0 (H) 0.0 - 149.0 mg/dL    Comment: Normal:  <150 mg/dLBorderline High:  150 - 199 mg/dL   HDL 36.70 (L) >39.00 mg/dL   VLDL 40.2 (H) 0.0 - 40.0 mg/dL   Total CHOL/HDL Ratio 5     Comment:                Men          Women1/2 Average Risk     3.4  3.3Average Risk          5.0          4.42X Average Risk          9.6          7.13X Average Risk          15.0          11.0                       NonHDL 129.30     Comment: NOTE:  Non-HDL goal should be 30 mg/dL higher than patient's LDL goal (i.e. LDL goal of < 70 mg/dL, would have non-HDL goal of < 100 mg/dL)  LDL cholesterol, direct     Status: None   Collection Time: 09/22/14  8:59 AM  Result Value Ref Range   Direct LDL 87.5 mg/dL    Comment: Optimal:  <100 mg/dLNear or Above Optimal:  100-129 mg/dLBorderline High:  130-159 mg/dLHigh:  160-189 mg/dLVery High:  >190 mg/dL  Urinalysis, Routine w reflex microscopic     Status: Abnormal   Collection Time: 09/22/14  8:59 AM  Result Value Ref Range   Color, Urine YELLOW Yellow;Lt. Yellow   APPearance CLEAR Clear   Specific Gravity, Urine 1.020 1.000-1.030   pH 5.5 5.0 - 8.0   Total Protein, Urine NEGATIVE Negative   Urine Glucose NEGATIVE Negative   Ketones, ur NEGATIVE Negative   Bilirubin Urine NEGATIVE Negative   Hgb urine dipstick NEGATIVE Negative    Urobilinogen, UA 0.2 0.0 - 1.0   Leukocytes, UA NEGATIVE Negative   Nitrite NEGATIVE Negative   WBC, UA 0-2/hpf 0-2/hpf   RBC / HPF 0-2/hpf 0-2/hpf   Mucus, UA Presence of (A) None   Squamous Epithelial / LPF Rare(0-4/hpf) Rare(0-4/hpf)   Bacteria, UA Rare(<10/hpf) (A) None    Assessment/Plan: Hypertension Continue current medication regimen.  DASH diet discussed.  Handout given.  Will monitor at follow-up.   Gastroesophageal reflux disease without esophagitis GERD diet discussed.  Will begin Omeprazole daily to help alleviate symptoms.  Suspect weight loss will be very beneficial.  Diabetes mellitus type II, uncontrolled Will check labs today.  Patient to continue ACEI therapy.  Continue current regimen. Check fasting glucose daily. Write down.  Bring to follow-up.  Referral placed to [facility] for diabetes education.  Pancreatitis, acute Resolved clinically.  Patient s/p cholecystectomy. Will repeat labs today.  Morbid obesity Referral to [facility] placed for diabetic education and weight loss.  Calorie restriction discussed.  Will check TSH today.  Follow-up as scheduled.

## 2014-09-28 DIAGNOSIS — IMO0002 Reserved for concepts with insufficient information to code with codable children: Secondary | ICD-10-CM | POA: Insufficient documentation

## 2014-09-28 DIAGNOSIS — K219 Gastro-esophageal reflux disease without esophagitis: Secondary | ICD-10-CM | POA: Insufficient documentation

## 2014-09-28 DIAGNOSIS — E1165 Type 2 diabetes mellitus with hyperglycemia: Secondary | ICD-10-CM | POA: Insufficient documentation

## 2014-09-28 NOTE — Assessment & Plan Note (Signed)
Referral to [facility] placed for diabetic education and weight loss.  Calorie restriction discussed.  Will check TSH today.  Follow-up as scheduled.

## 2014-09-28 NOTE — Assessment & Plan Note (Signed)
Resolved clinically.  Patient s/p cholecystectomy. Will repeat labs today.

## 2014-09-28 NOTE — Assessment & Plan Note (Signed)
GERD diet discussed.  Will begin Omeprazole daily to help alleviate symptoms.  Suspect weight loss will be very beneficial.

## 2014-09-28 NOTE — Assessment & Plan Note (Signed)
Continue current medication regimen.  DASH diet discussed.  Handout given.  Will monitor at follow-up.

## 2014-09-28 NOTE — Assessment & Plan Note (Signed)
Will check labs today.  Patient to continue ACEI therapy.  Continue current regimen. Check fasting glucose daily. Write down.  Bring to follow-up.  Referral placed to [facility] for diabetes education.

## 2014-10-02 ENCOUNTER — Encounter: Payer: Self-pay | Admitting: Physician Assistant

## 2014-10-02 MED ORDER — INDOMETHACIN 20 MG PO CAPS: 20.0000 mg | ORAL_CAPSULE | Freq: Two times a day (BID) | ORAL | Status: DC

## 2014-10-03 ENCOUNTER — Encounter: Payer: BC Managed Care – PPO | Attending: Physician Assistant

## 2014-10-03 VITALS — Ht 71.0 in | Wt >= 6400 oz

## 2014-10-03 DIAGNOSIS — E1165 Type 2 diabetes mellitus with hyperglycemia: Secondary | ICD-10-CM | POA: Insufficient documentation

## 2014-10-03 DIAGNOSIS — Z713 Dietary counseling and surveillance: Secondary | ICD-10-CM | POA: Insufficient documentation

## 2014-10-03 DIAGNOSIS — IMO0002 Reserved for concepts with insufficient information to code with codable children: Secondary | ICD-10-CM

## 2014-10-03 NOTE — Progress Notes (Signed)
Patient was seen on 10/03/14 for the first of a series of three diabetes self-management courses at the [facility].  Patient Education Plan per assessed needs and concerns is to attend four course education program for Diabetes Self Management Education.  The following learning objectives were met by the patient during this class:  Describe diabetes  State some common risk factors for diabetes  Defines the role of glucose and insulin  Identifies type of diabetes and pathophysiology  Describe the relationship between diabetes and cardiovascular risk  State the members of the Healthcare Team  States the rationale for glucose monitoring  State when to test glucose  State their individual Target Range  State the importance of logging glucose readings  Describe how to interpret glucose readings  Identifies A1C target  Explain the correlation between A1c and eAG values  State symptoms and treatment of high blood glucose  State symptoms and treatment of low blood glucose  Explain proper technique for glucose testing  Identifies proper sharps disposal  Handouts given during class include:  Living Well with Diabetes book  Carb Counting and Meal Planning book  Meal Plan Card  Carbohydrate guide  Meal planning worksheet  Low Sodium Flavoring Tips  The diabetes portion plate  R8V to eAG Conversion Chart  Diabetes Medications  Diabetes Recommended Care Schedule  Support Group  Diabetes Success Plan  Core Class Satisfaction Survey  Follow-Up Plan:  Attend core 2

## 2014-10-04 ENCOUNTER — Ambulatory Visit (INDEPENDENT_AMBULATORY_CARE_PROVIDER_SITE_OTHER): Payer: BC Managed Care – PPO | Admitting: Pulmonary Disease

## 2014-10-04 ENCOUNTER — Encounter: Payer: Self-pay | Admitting: Pulmonary Disease

## 2014-10-04 VITALS — BP 128/74 | HR 84 | Temp 97.3°F | Ht 71.0 in | Wt >= 6400 oz

## 2014-10-04 DIAGNOSIS — G4733 Obstructive sleep apnea (adult) (pediatric): Secondary | ICD-10-CM

## 2014-10-04 DIAGNOSIS — R0683 Snoring: Secondary | ICD-10-CM | POA: Insufficient documentation

## 2014-10-04 NOTE — Assessment & Plan Note (Signed)
The patient's history is very consistent with sleep disordered breathing, and I have discussed the pathophysiology of sleep apnea with her. I think she needs a sleep study for diagnosis, she would be an excellent candidate for home sleep testing. The patient is agreeable to this approach. Will arrange follow-up once the results are available. Also, I have encouraged her to work aggressively on weight loss.

## 2014-10-04 NOTE — Progress Notes (Signed)
Subjective:    Patient ID: Angel Dawson, female    DOB: [birth year], 33 y.o.   MRN: [id number]  HPI The patient is a 33 year old female who I've been asked to see for possible obstructive sleep apnea. She is morbidly obese and has had great difficulties with daytime sleepiness and fatigue. She is unsure if she snores, but friends have commented on an abnormal breathing pattern during sleep. She does not awaken at night, but does not feel completely rested in the mornings upon arising. She notes severe inappropriate daytime sleepiness while at work, and can doze during conversations. She will fall asleep in the evenings trying to watch television or movies, and can have sleep pressure driving longer distances. The patient states that her weight is up about 30 pounds over the last 2 years, and her Epworth score today is 23.   Sleep Questionnaire What time do you typically go to bed?( Between what hours) 9-11p 9-11p at 1542 on 10/04/14 by Inge Rise, CMA How long does it take you to fall asleep? less than 5 min less than 5 min at 1542 on 10/04/14 by Inge Rise, CMA How many times during the night do you wake up? 0 0 at 1542 on 10/04/14 by Inge Rise, [place] What time do you get out of bed to start your day? 0500 0500 at 1542 on 10/04/14 by Inge Rise, CMA Do you drive or operate heavy machinery in your occupation? No No at 1542 on 10/04/14 by Inge Rise, CMA How much has your weight changed (up or down) over the past two years? (In pounds) 30 lb (13.608 kg) 30 lb (13.608 kg) at 1542 on 10/04/14 by Inge Rise, CMA Have you ever had a sleep study before? Yes Yes at 1542 on 10/04/14 by Inge Rise, CMA If yes[facility] on 10/04/14 by Inge Rise, CMA If yes, date of study? 2011 2011 at 1542 on 10/04/14 by Inge Rise, CMA Do you currently use CPAP? No No at 1542 on 10/04/14 by Inge Rise, CMA Do you wear oxygen at any  time? No No at 1542 on 10/04/14 by Inge Rise, CMA   Review of Systems  Constitutional: Negative for fever and unexpected weight change.  HENT: Negative for congestion, dental problem, ear pain, nosebleeds, postnasal drip, rhinorrhea, sinus pressure, sneezing, sore throat and trouble swallowing.   Eyes: Negative for redness and itching.  Respiratory: Negative for cough, chest tightness, shortness of breath and wheezing.   Cardiovascular: Positive for leg swelling. Negative for palpitations.  Gastrointestinal: Negative for nausea and vomiting.  Genitourinary: Negative for dysuria.  Musculoskeletal: Negative for joint swelling.  Skin: Negative for rash.  Neurological: Positive for headaches.  Hematological: Does not bruise/bleed easily.  Psychiatric/Behavioral: Negative for dysphoric mood. The patient is not nervous/anxious.        Objective:   Physical Exam Constitutional:  Morbidly obese female, no acute distress  HENT:  Nares patent without discharge, large turbinates  Oropharynx without exudate, palate and uvula are moderately elongated.  Eyes:  Perrla, eomi, no scleral icterus  Neck:  No JVD, no TMG  Cardiovascular:  Normal rate, regular rhythm, no rubs or gallops.  No murmurs        Intact distal pulses  Pulmonary :  Normal breath sounds, no stridor or respiratory distress   No rales, rhonchi, or wheezing  Abdominal:  Soft, nondistended, bowel sounds present.  No tenderness noted.   Musculoskeletal:  1+ lower extremity edema noted.  Lymph Nodes:  No cervical lymphadenopathy noted  Skin:  No cyanosis noted  Neurologic:  Alert, appropriate, moves all 4 extremities without obvious deficit.         Assessment & Plan:

## 2014-10-04 NOTE — Patient Instructions (Signed)
Will schedule for home sleep testing, and will arrange followup once the results are available.

## 2014-10-10 DIAGNOSIS — IMO0002 Reserved for concepts with insufficient information to code with codable children: Secondary | ICD-10-CM

## 2014-10-10 DIAGNOSIS — E1165 Type 2 diabetes mellitus with hyperglycemia: Secondary | ICD-10-CM

## 2014-10-10 NOTE — Progress Notes (Signed)

## 2014-10-11 ENCOUNTER — Telehealth: Payer: Self-pay | Admitting: *Deleted

## 2014-10-11 NOTE — Telephone Encounter (Signed)
Prior authorization initiated for Indomethacin 20 MG CAPS. Awaiting determination. JG//CMA

## 2014-10-11 NOTE — Telephone Encounter (Signed)
PA approved effective 09/11/2014 through 10/11/2015. JG//CMA

## 2014-10-14 ENCOUNTER — Emergency Department (HOSPITAL_BASED_OUTPATIENT_CLINIC_OR_DEPARTMENT_OTHER)
Admission: EM | Admit: 2014-10-14 | Discharge: 2014-10-14 | Disposition: A | Discharge status: Home / Self Care | Payer: BLUE CROSS/BLUE SHIELD | Attending: Emergency Medicine | Admitting: Emergency Medicine

## 2014-10-14 ENCOUNTER — Encounter (HOSPITAL_BASED_OUTPATIENT_CLINIC_OR_DEPARTMENT_OTHER): Payer: Self-pay | Admitting: *Deleted

## 2014-10-14 DIAGNOSIS — R110 Nausea: Secondary | ICD-10-CM | POA: Insufficient documentation

## 2014-10-14 DIAGNOSIS — R51 Headache: Secondary | ICD-10-CM | POA: Insufficient documentation

## 2014-10-14 DIAGNOSIS — E1310 Other specified diabetes mellitus with ketoacidosis without coma: Secondary | ICD-10-CM | POA: Diagnosis not present

## 2014-10-14 DIAGNOSIS — K219 Gastro-esophageal reflux disease without esophagitis: Secondary | ICD-10-CM | POA: Insufficient documentation

## 2014-10-14 DIAGNOSIS — Z791 Long term (current) use of non-steroidal anti-inflammatories (NSAID): Secondary | ICD-10-CM | POA: Insufficient documentation

## 2014-10-14 DIAGNOSIS — I10 Essential (primary) hypertension: Secondary | ICD-10-CM | POA: Insufficient documentation

## 2014-10-14 DIAGNOSIS — Z79899 Other long term (current) drug therapy: Secondary | ICD-10-CM | POA: Insufficient documentation

## 2014-10-14 DIAGNOSIS — R519 Headache, unspecified: Secondary | ICD-10-CM

## 2014-10-14 LAB — CBG MONITORING, ED: GLUCOSE-CAPILLARY: 103 mg/dL — AB (ref 70–99)

## 2014-10-14 MED ORDER — METOCLOPRAMIDE HCL 5 MG/ML IJ SOLN
10.0000 mg | Freq: Once | INTRAMUSCULAR | Status: AC
  Administered 2014-10-14: 10 mg via INTRAVENOUS
  Filled 2014-10-14: qty 2

## 2014-10-14 MED ORDER — SODIUM CHLORIDE 0.9 % IV BOLUS (SEPSIS)
1000.0000 mL | Freq: Once | INTRAVENOUS | Status: AC
  Administered 2014-10-14: 1000 mL via INTRAVENOUS

## 2014-10-14 MED ORDER — KETOROLAC TROMETHAMINE 30 MG/ML IJ SOLN
30.0000 mg | Freq: Once | INTRAMUSCULAR | Status: AC
  Administered 2014-10-14: 30 mg via INTRAVENOUS
  Filled 2014-10-14: qty 1

## 2014-10-14 MED ORDER — DIPHENHYDRAMINE HCL 50 MG/ML IJ SOLN
25.0000 mg | Freq: Once | INTRAMUSCULAR | Status: AC
  Administered 2014-10-14: 25 mg via INTRAVENOUS
  Filled 2014-10-14: qty 1

## 2014-10-14 NOTE — ED Notes (Addendum)
Pt reports headache x 3 days- denies mha history- denies vomiting, some nausea- recently dx with diabetes

## 2014-10-14 NOTE — Discharge Instructions (Signed)

## 2014-10-14 NOTE — ED Provider Notes (Signed)
CSN: [id number]     Arrival date & time 10/14/14  1655 History   First MD Initiated Contact with Patient 10/14/14 1737     Chief Complaint  Patient presents with  . Headache   Navah Grondin is a 34 y.o. female with a history of hypertension, GERD and diabetes who presents to emergency department complaining of a headache for the past 3 days. Patient reports she had gradual onset of a headache 3 days ago is located in her frontal lobe. Patient reports that pain fluctuates in intensity from 3/10 to an 8 out of 10 at its most. Patient currently reports her pain at 7 out of 10 in her frontal lobe. Patient has had similar headaches before. The patient reports associated photophobia, and some nausea without vomiting. Patient denies history of migraines. Patient has been recently diagnosed diabetes and reports her blood sugars have been good lately. The patient denies fevers, chills, abdominal pain, nasal congestion, postnasal drip, chest pain, shortness of breath, cough, numbness, tingling, weakness, fatigue or changes to her vision.  (Consider location/radiation/quality/duration/timing/severity/associated sxs/prior Treatment) HPI  Past Medical History  Diagnosis Date  . Hypertension   . GERD (gastroesophageal reflux disease)   . Abdominal pain     related to gallstones  . Gallstones   . Sinus congestion     occ. uses OTC meds for this  . Pancreatitis   . DKA (diabetic ketoacidoses) 08/25/2014  . Diabetes    Past Surgical History  Procedure Laterality Date  . Tonsillectomy    . Cholecystectomy N/A 06/22/2013    Procedure: LAPAROSCOPIC CHOLECYSTECTOMY ;  Surgeon: Adin Hector, MD;  Location: [facility];  Service: General;  Laterality: N/A;   Family History  Problem Relation Age of Onset  . Hypertension Mother   . Diabetes Mother   . Hypertension Father   . Diabetes Father   . Stroke Paternal Grandfather   . Lung cancer Father   . Asthma     History  Substance Use Topics  . Smoking  status: Never Smoker   . Smokeless tobacco: Never Used  . Alcohol Use: 0.0 oz/week    0 Not specified per week     Comment: 1 drink per month   OB History    No data available     Review of Systems  Constitutional: Negative for fever and chills.  HENT: Negative for congestion and sore throat.   Eyes: Negative for visual disturbance.  Respiratory: Negative for cough, shortness of breath and wheezing.   Cardiovascular: Negative for chest pain and palpitations.  Gastrointestinal: Positive for nausea. Negative for vomiting, abdominal pain and diarrhea.  Genitourinary: Negative for dysuria.  Musculoskeletal: Negative for back pain and neck pain.  Skin: Negative for rash.  Neurological: Positive for headaches. Negative for dizziness, syncope, facial asymmetry, weakness, light-headedness and numbness.      Allergies  Review of patient's allergies indicates no known allergies.  Home Medications   Prior to Admission medications   Medication Sig Start Date End Date Taking? Authorizing Provider  carvedilol (COREG) 12.5 MG tablet Take 12.5 mg by mouth 2 (two) times daily with a meal.    Yes Historical Provider, MD  lisinopril-hydrochlorothiazide (PRINZIDE,ZESTORETIC) 20-25 MG per tablet Take 1 tablet by mouth daily.    Yes Historical Provider, MD  metFORMIN (GLUCOPHAGE) 500 MG tablet Take 1 tablet (500 mg total) by mouth 2 (two) times daily with a meal. 08/26/14  Yes Annita Brod, MD  Multiple Vitamin (MULTIVITAMIN WITH MINERALS) TABS tablet  Take 1 tablet by mouth daily.   Yes Historical Provider, MD  omeprazole (PRILOSEC) 20 MG capsule Take 1 capsule (20 mg total) by mouth daily. 09/22/14  Yes Brunetta Jeans, PA-C  ONE TOUCH ULTRA TEST test strip  08/26/14  Yes Historical Provider, MD  Jonetta Speak LANCETS [address] [place]  08/26/14  Yes Historical Provider, MD  Polyethyl Glycol-Propyl Glycol (SYSTANE OP) Place 1 drop into both eyes daily as needed (dry eyes).   Yes Historical Provider,  MD  Blood Glucose Monitoring Suppl (BLOOD GLUCOSE METER KIT AND SUPPLIES) Dispense based on patient and insurance preference. Use up to four times daily as directed. (FOR ICD-9 250.00, 250.01). 08/26/14   Annita Brod, MD  ibuprofen (ADVIL,MOTRIN) 200 MG tablet Take 400 mg by mouth daily as needed (body aches/pain).    Historical Provider, MD  Indomethacin 20 MG CAPS Take 20 mg by mouth 2 (two) times daily. Patient taking differently: Take 20 mg by mouth as needed.  10/02/14   Brunetta Jeans, PA-C   BP 150/90 mmHg  Pulse 89  Temp(Src) 97.9 F (36.6 C) (Oral)  Resp 18  Ht _0  (1.803 m)  Wt 415 lb (188.243 kg)  BMI 57.91 kg/m2  SpO2 100%  LMP 10/04/2014 Physical Exam  Constitutional: She is oriented to person, place, and time. She appears well-developed and well-nourished. No distress.  HENT:  Head: Normocephalic and atraumatic.  Right Ear: External ear normal.  Left Ear: External ear normal.  Nose: Nose normal.  Mouth/Throat: Oropharynx is clear and moist. No oropharyngeal exudate.  Patient's bilateral tympanic membranes are pearly-gray without erythema or loss of landmarks. There is no temporal edema or tenderness noted.  Eyes: Conjunctivae and EOM are normal. Pupils are equal, round, and reactive to light. Right eye exhibits no discharge. Left eye exhibits no discharge.  Neck: Normal range of motion. Neck supple.  Cardiovascular: Normal rate, regular rhythm, normal heart sounds and intact distal pulses.  Exam reveals no gallop and no friction rub.   No murmur heard. Pulmonary/Chest: Effort normal and breath sounds normal. No respiratory distress. She has no wheezes. She has no rales.  Abdominal: Soft. Bowel sounds are normal. She exhibits no distension and no mass. There is no tenderness. There is no rebound and no guarding.  Musculoskeletal: Normal range of motion. She exhibits no edema.  Patient's strength is 5 out of 5 in her bilateral upper and lower extremities.  Patient is able to ambulate in the room without difficulty or assistance.  Lymphadenopathy:    She has no cervical adenopathy.  Neurological: She is alert and oriented to person, place, and time. No cranial nerve deficit. Coordination normal.  Cranial nerves II through XII are intact bilaterally. Patient has good sensation in her bilateral face.  Skin: Skin is warm and dry. No rash noted. She is not diaphoretic. No erythema. No pallor.  Psychiatric: She has a normal mood and affect. Her behavior is normal.  Nursing note and vitals reviewed.   ED Course  Procedures (including critical care time) Labs Review Labs Reviewed  CBG MONITORING, ED - Abnormal; Notable for the following:    Glucose-Capillary 103 (*)    All other components within normal limits    Imaging Review No results found.   EKG Interpretation None      Filed Vitals:   10/14/14 1723 10/14/14 1942 10/14/14 2117  BP: 149/92 144/72 150/90  Pulse: 88 79 89  Temp: 98.4 F (36.9 C) 97.9 F (36.6 C)  TempSrc: Oral Oral   Resp: 20  18  Height: _0  (1.803 m)    Weight: 415 lb (188.243 kg)    SpO2: 100% 100% 100%     MDM   Meds given in ED:  Medications  sodium chloride 0.9 % bolus 1,000 mL (0 mLs Intravenous Stopped 10/14/14 2117)  ketorolac (TORADOL) 30 MG/ML injection 30 mg (30 mg Intravenous Given 10/14/14 1857)  metoCLOPramide (REGLAN) injection 10 mg (10 mg Intravenous Given 10/14/14 1857)  diphenhydrAMINE (BENADRYL) injection 25 mg (25 mg Intravenous Given 10/14/14 1857)    Discharge Medication List as of 10/14/2014  9:02 PM      Final diagnoses:  Acute nonintractable headache, unspecified headache type   This a 34 year old female who presented to the emergency department complaining of 3 days of a headache. Patient reported a gradual onset of a headache which is been fluctuating intensity. The patient reports similar previous headaches. Patient has recently been diagnosed with diabetes but reports her  sugars have been normal. The patient's blood sugar here today is within normal limits. The patient is afebrile and nontoxic-appearing. The patient is neurologically intact. At reevaluation the patient reports her headache is completely resolved and she feels ready to be discharged. Education provided on migraine headache prevention. Advised patient she should follow-up with her primary care provider for continued migraines. I advised patient to return to the emergency department for new or worsening symptoms or new concerns. The patient verbalized understanding and agreement with plan.    Hanley Hays, PA-C 10/15/14 0205  Quintella Reichert, MD 10/15/14 1452

## 2014-10-17 ENCOUNTER — Encounter: Payer: BLUE CROSS/BLUE SHIELD | Attending: Physician Assistant

## 2014-10-17 DIAGNOSIS — E1165 Type 2 diabetes mellitus with hyperglycemia: Secondary | ICD-10-CM | POA: Diagnosis present

## 2014-10-17 DIAGNOSIS — Z713 Dietary counseling and surveillance: Secondary | ICD-10-CM | POA: Diagnosis not present

## 2014-10-17 DIAGNOSIS — IMO0002 Reserved for concepts with insufficient information to code with codable children: Secondary | ICD-10-CM

## 2014-10-17 NOTE — Progress Notes (Signed)
Patient was seen on 10/17/14 for the third of a series of three diabetes self-management courses at the [facility]. The following learning objectives were met by the patient during this class:  . State the amount of activity recommended for healthy living . Describe activities suitable for individual needs . Identify ways to regularly incorporate activity into daily life . Identify barriers to activity and ways to over come these barriers  Identify diabetes medications being personally used and their primary action for lowering glucose and possible side effects . Describe role of stress on blood glucose and develop strategies to address psychosocial issues . Identify diabetes complications and ways to prevent them  Explain how to manage diabetes during illness . Evaluate success in meeting personal goal . Establish 2-3 goals that they will plan to diligently work on until they return for the  78-monthfollow-up visit  Goals:   I will be active 30 minutes or more 3 times a week  i want to lose 10-30% of my body weight (41-122 pounds)  Your patient has identified these potential barriers to change:  Motivation Stress  Your patient has identified their diabetes self-care support plan as  Family Support On-line Resources Plan:  Attend Core 4 in 4 months

## 2014-10-19 DIAGNOSIS — G4730 Sleep apnea, unspecified: Secondary | ICD-10-CM

## 2014-10-24 ENCOUNTER — Telehealth: Payer: Self-pay | Admitting: Pulmonary Disease

## 2014-10-24 DIAGNOSIS — G4730 Sleep apnea, unspecified: Secondary | ICD-10-CM

## 2014-10-24 NOTE — Telephone Encounter (Signed)
Called pt and appt scheduled to come in today. Nothing further needed

## 2014-10-24 NOTE — Telephone Encounter (Signed)
Please arrange for pt to come in to review her recent home sleep test. Thanks.

## 2014-10-25 ENCOUNTER — Encounter: Payer: Self-pay | Admitting: Pulmonary Disease

## 2014-10-25 ENCOUNTER — Ambulatory Visit (INDEPENDENT_AMBULATORY_CARE_PROVIDER_SITE_OTHER): Payer: BLUE CROSS/BLUE SHIELD | Admitting: Physician Assistant

## 2014-10-25 ENCOUNTER — Ambulatory Visit (INDEPENDENT_AMBULATORY_CARE_PROVIDER_SITE_OTHER): Payer: BLUE CROSS/BLUE SHIELD | Admitting: Pulmonary Disease

## 2014-10-25 ENCOUNTER — Encounter: Payer: Self-pay | Admitting: Physician Assistant

## 2014-10-25 VITALS — BP 124/78 | HR 87 | Temp 97.6°F | Ht 71.0 in | Wt >= 6400 oz

## 2014-10-25 VITALS — BP 146/90 | HR 86 | Temp 98.6°F | Resp 16 | Ht 71.0 in | Wt >= 6400 oz

## 2014-10-25 DIAGNOSIS — Z0000 Encounter for general adult medical examination without abnormal findings: Secondary | ICD-10-CM

## 2014-10-25 DIAGNOSIS — R0982 Postnasal drip: Secondary | ICD-10-CM

## 2014-10-25 DIAGNOSIS — R0683 Snoring: Secondary | ICD-10-CM

## 2014-10-25 DIAGNOSIS — I10 Essential (primary) hypertension: Secondary | ICD-10-CM

## 2014-10-25 DIAGNOSIS — E1165 Type 2 diabetes mellitus with hyperglycemia: Secondary | ICD-10-CM

## 2014-10-25 DIAGNOSIS — IMO0002 Reserved for concepts with insufficient information to code with codable children: Secondary | ICD-10-CM

## 2014-10-25 DIAGNOSIS — B351 Tinea unguium: Secondary | ICD-10-CM

## 2014-10-25 MED ORDER — CARVEDILOL 12.5 MG PO TABS: 12.5000 mg | ORAL_TABLET | Freq: Two times a day (BID) | ORAL | Status: DC

## 2014-10-25 MED ORDER — LISINOPRIL-HYDROCHLOROTHIAZIDE 20-25 MG PO TABS: 1.0000 | ORAL_TABLET | Freq: Every day | ORAL | Status: DC

## 2014-10-25 MED ORDER — TERBINAFINE HCL 250 MG PO TABS: 250.0000 mg | ORAL_TABLET | Freq: Every day | ORAL | Status: DC

## 2014-10-25 MED ORDER — GLUCOSE BLOOD VI STRP
ORAL_STRIP | Status: DC
Start: 2014-10-25 — End: 2016-10-08

## 2014-10-25 MED ORDER — METFORMIN HCL 500 MG PO TABS: 500.0000 mg | ORAL_TABLET | Freq: Two times a day (BID) | ORAL | Status: DC

## 2014-10-25 MED ORDER — ONETOUCH DELICA LANCETS 33G MISC: Status: DC

## 2014-10-25 NOTE — Patient Instructions (Signed)
Work on weight loss over the next 80mos, and let me know if you are not improving from a sleep and alertness standpoint.

## 2014-10-25 NOTE — Progress Notes (Signed)
Patient presents to clinic today for annual exam.  Patient is fasting for labs.  Acute Concerns: Patient complains of 5-6 days of nasal congestion and now with 1 day of a mild cough.  Denies sinus pressure, sinus pain, chest congestion, fever, chills or malaise.  Denies recent travel or sick contact.  Chronic Issues: Diabetes Mellitus II, previously uncontrolled. -- AM fasting glucose around 110-120. Is taking Metformin as directed without GI upset.  Has upcoming appointment with Nutritionist. Needs referral to Opthalmology.  Is overdue for diabetic foot exam.  Hypertension -- Endorses previously well-controlled with Carvedilol and Prinzide.  Has not taken medication this morning.  Denies chest pain, palpitations, lightheadedness or dizziness.  Health Maintenance: Dental -- up-to-date Vision -- up-to-date with Optometry.  Needs referral to Ophthalmology. Will place to [facility]. Immunizations -- up-to-date on required immunizations. PAP -- up-to-date.  No abnormal findings.  Followed by OB/GYN -- Dr. Charlesetta Garibaldi.   Past Medical History  Diagnosis Date  . Hypertension   . GERD (gastroesophageal reflux disease)   . Abdominal pain     related to gallstones  . Gallstones   . Sinus congestion     occ. uses OTC meds for this  . Pancreatitis   . DKA (diabetic ketoacidoses) 08/25/2014  . Diabetes   . Headache     Past Surgical History  Procedure Laterality Date  . Tonsillectomy    . Cholecystectomy N/A 06/22/2013    Procedure: LAPAROSCOPIC CHOLECYSTECTOMY ;  Surgeon: Adin Hector, MD;  Location: [facility];  Service: General;  Laterality: N/A;    Current Outpatient Prescriptions on File Prior to Visit  Medication Sig Dispense Refill  . Blood Glucose Monitoring Suppl (BLOOD GLUCOSE METER KIT AND SUPPLIES) Dispense based on patient and insurance preference. Use up to four times daily as directed. (FOR ICD-9 250.00, 250.01). 1 each 0  . ibuprofen (ADVIL,MOTRIN) 200 MG tablet  Take 400 mg by mouth daily as needed (body aches/pain).    . Indomethacin 20 MG CAPS Take 20 mg by mouth 2 (two) times daily. (Patient taking differently: Take 20 mg by mouth as needed. ) 60 capsule 0  . Multiple Vitamin (MULTIVITAMIN WITH MINERALS) TABS tablet Take 1 tablet by mouth daily.    Marland Kitchen omeprazole (PRILOSEC) 20 MG capsule Take 1 capsule (20 mg total) by mouth daily. 30 capsule 3  . Polyethyl Glycol-Propyl Glycol (SYSTANE OP) Place 1 drop into both eyes daily as needed (dry eyes).     No current facility-administered medications on file prior to visit.    No Known Allergies  Family History  Problem Relation Age of Onset  . Hypertension Mother   . Diabetes Mother   . Hypertension Father   . Diabetes Father   . Stroke Paternal Grandfather   . Lung cancer Father   . Asthma      History   Social History  . Marital Status: Single    Spouse Name: N/A    Number of Children: 0  . Years of Education: N/A   Occupational History  . research/scientist    Social History Main Topics  . Smoking status: Never Smoker   . Smokeless tobacco: Never Used  . Alcohol Use: 0.0 oz/week    0 Not specified per week     Comment: 1 drink per month  . Drug Use: No  . Sexual Activity: No   Other Topics Concern  . Not on file   Social History Narrative   Review of Systems  Constitutional: Negative for fever and weight loss.  HENT: Positive for congestion. Negative for ear discharge, ear pain, hearing loss and tinnitus.   Eyes: Negative for blurred vision, double vision, photophobia and pain.  Respiratory: Positive for cough. Negative for hemoptysis, sputum production, shortness of breath and wheezing.   Cardiovascular: Negative for chest pain and palpitations.  Gastrointestinal: Negative for heartburn, nausea, vomiting, abdominal pain, diarrhea, constipation, blood in stool and melena.  Genitourinary: Negative for dysuria, urgency, frequency, hematuria and flank pain.  Neurological:  Negative for dizziness, loss of consciousness and headaches.  Psychiatric/Behavioral: Negative for depression and suicidal ideas. The patient is not nervous/anxious and does not have insomnia.    BP 146/90 mmHg  Pulse 86  Temp(Src) 98.6 F (37 C) (Oral)  Resp 16  Ht _0  (1.803 m)  Wt 407 lb 4 oz (184.727 kg)  BMI 56.82 kg/m2  SpO2 100%  LMP 10/04/2014  Physical Exam  Recent Results (from the past 2160 hour(s))  CBG monitoring, ED     Status: Abnormal   Collection Time: 08/25/14  8:49 AM  Result Value Ref Range   Glucose-Capillary 407 (H) 70 - 99 mg/dL  Urinalysis, Routine w reflex microscopic     Status: Abnormal   Collection Time: 08/25/14  8:50 AM  Result Value Ref Range   Color, Urine YELLOW YELLOW   APPearance CLEAR CLEAR   Specific Gravity, Urine 1.033 (H) 1.005 - 1.030   pH 5.5 5.0 - 8.0   Glucose, UA >1000 (A) NEGATIVE mg/dL   Hgb urine dipstick NEGATIVE NEGATIVE   Bilirubin Urine NEGATIVE NEGATIVE   Ketones, ur >80 (A) NEGATIVE mg/dL   Protein, ur NEGATIVE NEGATIVE mg/dL   Urobilinogen, UA 0.2 0.0 - 1.0 mg/dL   Nitrite NEGATIVE NEGATIVE   Leukocytes, UA NEGATIVE NEGATIVE  Pregnancy, urine     Status: None   Collection Time: 08/25/14  8:50 AM  Result Value Ref Range   Preg Test, Ur NEGATIVE NEGATIVE    Comment:        THE SENSITIVITY OF THIS METHODOLOGY IS >20 mIU/mL.   Urine microscopic-add on     Status: None   Collection Time: 08/25/14  8:50 AM  Result Value Ref Range   Squamous Epithelial / LPF RARE RARE   WBC, UA 0-2 <3 WBC/hpf   RBC / HPF 0-2 <3 RBC/hpf   Bacteria, UA RARE RARE  CBC with Differential     Status: None   Collection Time: 08/25/14  9:20 AM  Result Value Ref Range   WBC 8.0 4.0 - 10.5 K/uL    Comment: WHITE COUNT CONFIRMED ON SMEAR   RBC 4.92 3.87 - 5.11 MIL/uL   Hemoglobin 13.5 12.0 - 15.0 g/dL   HCT 40.6 36.0 - 46.0 %   MCV 82.5 78.0 - 100.0 fL   MCH 27.4 26.0 - 34.0 pg   MCHC 33.3 30.0 - 36.0 g/dL   RDW 14.2 11.5 - 15.5 %     Platelets  150 - 400 K/uL    PLATELET CLUMPS NOTED ON SMEAR, COUNT APPEARS ADEQUATE   Neutrophils Relative % 52 43 - 77 %   Lymphocytes Relative 45 12 - 46 %   Monocytes Relative 3 3 - 12 %   Eosinophils Relative 0 0 - 5 %   Basophils Relative 0 0 - 1 %   Band Neutrophils 0 0 - 10 %   Metamyelocytes Relative 0 %   Myelocytes 0 %   Promyelocytes Absolute 0 %  Blasts 0 %   nRBC 0 0 /100 WBC   Neutro Abs 4.2 1.7 - 7.7 K/uL   Lymphs Abs 3.6 0.7 - 4.0 K/uL   Monocytes Absolute 0.2 0.1 - 1.0 K/uL   Eosinophils Absolute 0.0 0.0 - 0.7 K/uL   Basophils Absolute 0.0 0.0 - 0.1 K/uL   WBC Morphology ATYPICAL LYMPHOCYTES     Comment: VACUOLATED NEUTROPHILS   Smear Review      PLATELET CLUMPS NOTED ON SMEAR, COUNT APPEARS ADEQUATE  Comprehensive metabolic panel     Status: Abnormal   Collection Time: 08/25/14  9:20 AM  Result Value Ref Range   Sodium 133 (L) 137 - 147 mEq/L   Potassium 4.2 3.7 - 5.3 mEq/L   Chloride 94 (L) 96 - 112 mEq/L   CO2 19 19 - 32 mEq/L   Glucose, Bld 421 (H) 70 - 99 mg/dL   BUN 14 6 - 23 mg/dL   Creatinine, Ser 0.80 0.50 - 1.10 mg/dL   Calcium 10.4 8.4 - 10.5 mg/dL   Total Protein 7.9 6.0 - 8.3 g/dL   Albumin 4.3 3.5 - 5.2 g/dL   AST 24 0 - 37 U/L    Comment: HEMOLYSIS AT THIS LEVEL MAY AFFECT RESULT   ALT 21 0 - 35 U/L   Alkaline Phosphatase 110 39 - 117 U/L   Total Bilirubin 0.4 0.3 - 1.2 mg/dL   GFR calc non Af Amer >90 >90 mL/min   GFR calc Af Amer >90 >90 mL/min    Comment: (NOTE) The eGFR has been calculated using the CKD EPI equation. This calculation has not been validated in all clinical situations. eGFR's persistently <90 mL/min signify possible Chronic Kidney Disease.    Anion gap 20 (H) 5 - 15  Lipase, blood     Status: None   Collection Time: 08/25/14  9:20 AM  Result Value Ref Range   Lipase 42 11 - 59 U/L  Ketones, qualitative     Status: Abnormal   Collection Time: 08/25/14  9:20 AM  Result Value Ref Range   Acetone, Bld SMALL (A)  NEGATIVE  Hemoglobin A1c     Status: Abnormal   Collection Time: 08/25/14  9:20 AM  Result Value Ref Range   Hgb A1c MFr Bld 10.7 (H) <5.7 %    Comment: (NOTE)                                                                       According to the ADA Clinical Practice Recommendations for 2011, when HbA1c is used as a screening test:  >=6.5%   Diagnostic of Diabetes Mellitus           (if abnormal result is confirmed) 5.7-6.4%   Increased risk of developing Diabetes Mellitus References:Diagnosis and Classification of Diabetes Mellitus,Diabetes YTKZ,6010,93(ATFTD 1):S62-S69 and Standards of Medical Care in         Diabetes - 2011,Diabetes Care,2011,34 (Suppl 1):S11-S61.    Mean Plasma Glucose 260 (H) <117 mg/dL    Comment: Performed at [facility] venous blood gas, ED     Status: Abnormal   Collection Time: 08/25/14  9:26 AM  Result Value Ref Range   pH, Ven 7.449 (H) 7.250 - 7.300  pCO2, Ven 31.6 (L) 45.0 - 50.0 mmHg   pO2, Ven 70.0 (H) 30.0 - 45.0 mmHg   Bicarbonate 22.0 20.0 - 24.0 mEq/L   TCO2 23 0 - 100 mmol/L   O2 Saturation 95.0 %   Acid-base deficit 1.0 0.0 - 2.0 mmol/L   Sample type VENOUS   POC CBG, ED     Status: Abnormal   Collection Time: 08/25/14 10:20 AM  Result Value Ref Range   Glucose-Capillary 314 (H) 70 - 99 mg/dL  POC CBG, ED     Status: Abnormal   Collection Time: 08/25/14 11:35 AM  Result Value Ref Range   Glucose-Capillary 288 (H) 70 - 99 mg/dL  CBG monitoring, ED     Status: Abnormal   Collection Time: 08/25/14 12:43 PM  Result Value Ref Range   Glucose-Capillary 265 (H) 70 - 99 mg/dL  POC CBG, ED     Status: Abnormal   Collection Time: 08/25/14  1:45 PM  Result Value Ref Range   Glucose-Capillary 192 (H) 70 - 99 mg/dL   Comment 1 Notify RN   CBG monitoring, ED     Status: Abnormal   Collection Time: 08/25/14  2:57 PM  Result Value Ref Range   Glucose-Capillary 198 (H) 70 - 99 mg/dL  Glucose, capillary     Status: Abnormal    Collection Time: 08/25/14  4:57 PM  Result Value Ref Range   Glucose-Capillary 148 (H) 70 - 99 mg/dL  Glucose, capillary     Status: Abnormal   Collection Time: 08/25/14  6:04 PM  Result Value Ref Range   Glucose-Capillary 141 (H) 70 - 99 mg/dL  MRSA PCR Screening     Status: None   Collection Time: 08/25/14  6:40 PM  Result Value Ref Range   MRSA by PCR NEGATIVE NEGATIVE    Comment:        The GeneXpert MRSA Assay (FDA approved for NASAL specimens only), is one component of a comprehensive MRSA colonization surveillance program. It is not intended to diagnose MRSA infection nor to guide or monitor treatment for MRSA infections.   CBC     Status: None   Collection Time: 08/25/14  6:49 PM  Result Value Ref Range   WBC 7.3 4.0 - 10.5 K/uL   RBC 4.64 3.87 - 5.11 MIL/uL   Hemoglobin 12.4 12.0 - 15.0 g/dL   HCT 37.9 36.0 - 46.0 %   MCV 81.7 78.0 - 100.0 fL   MCH 26.7 26.0 - 34.0 pg   MCHC 32.7 30.0 - 36.0 g/dL   RDW 13.9 11.5 - 15.5 %   Platelets 236 150 - 400 K/uL  Basic metabolic panel (timed)     Status: Abnormal   Collection Time: 08/25/14  6:49 PM  Result Value Ref Range   Sodium 140 137 - 147 mEq/L   Potassium 3.7 3.7 - 5.3 mEq/L    Comment: HEMOLYSIS AT THIS LEVEL MAY AFFECT RESULT   Chloride 100 96 - 112 mEq/L   CO2 22 19 - 32 mEq/L   Glucose, Bld 142 (H) 70 - 99 mg/dL   BUN 10 6 - 23 mg/dL   Creatinine, Ser 0.70 0.50 - 1.10 mg/dL   Calcium 9.6 8.4 - 10.5 mg/dL   GFR calc non Af Amer >90 >90 mL/min   GFR calc Af Amer >90 >90 mL/min    Comment: (NOTE) The eGFR has been calculated using the CKD EPI equation. This calculation has not been validated in all clinical situations.  eGFR's persistently <90 mL/min signify possible Chronic Kidney Disease.    Anion gap 18 (H) 5 - 15  Glucose, capillary     Status: Abnormal   Collection Time: 08/25/14  7:17 PM  Result Value Ref Range   Glucose-Capillary 132 (H) 70 - 99 mg/dL  Glucose, capillary     Status: Abnormal    Collection Time: 08/25/14  8:20 PM  Result Value Ref Range   Glucose-Capillary 139 (H) 70 - 99 mg/dL  Basic metabolic panel (timed)     Status: Abnormal   Collection Time: 08/25/14  9:24 PM  Result Value Ref Range   Sodium 139 137 - 147 mEq/L   Potassium 3.6 (L) 3.7 - 5.3 mEq/L    Comment: SLIGHT HEMOLYSIS   Chloride 99 96 - 112 mEq/L   CO2 21 19 - 32 mEq/L   Glucose, Bld 168 (H) 70 - 99 mg/dL   BUN 9 6 - 23 mg/dL   Creatinine, Ser 0.70 0.50 - 1.10 mg/dL   Calcium 9.4 8.4 - 10.5 mg/dL   GFR calc non Af Amer >90 >90 mL/min   GFR calc Af Amer >90 >90 mL/min    Comment: (NOTE) The eGFR has been calculated using the CKD EPI equation. This calculation has not been validated in all clinical situations. eGFR's persistently <90 mL/min signify possible Chronic Kidney Disease.    Anion gap 19 (H) 5 - 15  Glucose, capillary     Status: Abnormal   Collection Time: 08/25/14 10:20 PM  Result Value Ref Range   Glucose-Capillary 169 (H) 70 - 99 mg/dL  Glucose, capillary     Status: Abnormal   Collection Time: 08/25/14 11:37 PM  Result Value Ref Range   Glucose-Capillary 186 (H) 70 - 99 mg/dL  Glucose, capillary     Status: Abnormal   Collection Time: 08/26/14 12:33 AM  Result Value Ref Range   Glucose-Capillary 162 (H) 70 - 99 mg/dL  Glucose, capillary     Status: Abnormal   Collection Time: 08/26/14  1:35 AM  Result Value Ref Range   Glucose-Capillary 170 (H) 70 - 99 mg/dL  Glucose, capillary     Status: Abnormal   Collection Time: 08/26/14  2:39 AM  Result Value Ref Range   Glucose-Capillary 178 (H) 70 - 99 mg/dL  Basic metabolic panel (timed)     Status: Abnormal   Collection Time: 08/26/14  5:15 AM  Result Value Ref Range   Sodium 137 137 - 147 mEq/L   Potassium 4.0 3.7 - 5.3 mEq/L    Comment: HEMOLYSIS AT THIS LEVEL MAY AFFECT RESULT   Chloride 97 96 - 112 mEq/L   CO2 23 19 - 32 mEq/L   Glucose, Bld 183 (H) 70 - 99 mg/dL   BUN 8 6 - 23 mg/dL   Creatinine, Ser 0.75 0.50 -  1.10 mg/dL   Calcium 9.0 8.4 - 10.5 mg/dL   GFR calc non Af Amer >90 >90 mL/min   GFR calc Af Amer >90 >90 mL/min    Comment: (NOTE) The eGFR has been calculated using the CKD EPI equation. This calculation has not been validated in all clinical situations. eGFR's persistently <90 mL/min signify possible Chronic Kidney Disease.    Anion gap 17 (H) 5 - 15  Glucose, capillary     Status: Abnormal   Collection Time: 08/26/14  6:01 AM  Result Value Ref Range   Glucose-Capillary 191 (H) 70 - 99 mg/dL  Glucose, capillary     Status:  Abnormal   Collection Time: 08/26/14  7:04 AM  Result Value Ref Range   Glucose-Capillary 191 (H) 70 - 99 mg/dL  Glucose, capillary     Status: Abnormal   Collection Time: 08/26/14  8:22 AM  Result Value Ref Range   Glucose-Capillary 177 (H) 70 - 99 mg/dL  Glucose, capillary     Status: Abnormal   Collection Time: 08/26/14  9:30 AM  Result Value Ref Range   Glucose-Capillary 163 (H) 70 - 99 mg/dL   Comment 1 Notify RN   Glucose, capillary     Status: Abnormal   Collection Time: 08/26/14 10:50 AM  Result Value Ref Range   Glucose-Capillary 152 (H) 70 - 99 mg/dL  Glucose, capillary     Status: Abnormal   Collection Time: 08/26/14  2:15 PM  Result Value Ref Range   Glucose-Capillary 328 (H) 70 - 99 mg/dL  Glucose, capillary     Status: Abnormal   Collection Time: 08/26/14  3:52 PM  Result Value Ref Range   Glucose-Capillary 324 (H) 70 - 99 mg/dL   Comment 1 Notify RN   CBC     Status: Abnormal   Collection Time: 09/22/14  8:59 AM  Result Value Ref Range   WBC 8.4 4.0 - 10.5 K/uL   RBC 4.44 3.87 - 5.11 Mil/uL   Platelets 315.0 150.0 - 400.0 K/uL   Hemoglobin 12.1 12.0 - 15.0 g/dL   HCT 37.5 36.0 - 46.0 %   MCV 84.4 78.0 - 100.0 fl   MCHC 32.2 30.0 - 36.0 g/dL   RDW 15.6 (H) 11.5 - 15.5 %  Comp Met (CMET)     Status: Abnormal   Collection Time: 09/22/14  8:59 AM  Result Value Ref Range   Sodium 135 135 - 145 mEq/L   Potassium 4.0 3.5 - 5.1  mEq/L   Chloride 104 96 - 112 mEq/L   CO2 26 19 - 32 mEq/L   Glucose, Bld 115 (H) 70 - 99 mg/dL   BUN 9 6 - 23 mg/dL   Creatinine, Ser 0.8 0.4 - 1.2 mg/dL   Total Bilirubin 0.4 0.2 - 1.2 mg/dL   Alkaline Phosphatase 68 39 - 117 U/L   AST 19 0 - 37 U/L   ALT 20 0 - 35 U/L   Total Protein 7.0 6.0 - 8.3 g/dL   Albumin 3.9 3.5 - 5.2 g/dL   Calcium 9.6 8.4 - 10.5 mg/dL   GFR 106.34 >60.00 mL/min  Lipid Profile     Status: Abnormal   Collection Time: 09/22/14  8:59 AM  Result Value Ref Range   Cholesterol 166 0 - 200 mg/dL    Comment: ATP III Classification       Desirable:  < 200 mg/dL               Borderline High:  200 - 239 mg/dL          High:  > = 240 mg/dL   Triglycerides 201.0 (H) 0.0 - 149.0 mg/dL    Comment: Normal:  <150 mg/dLBorderline High:  150 - 199 mg/dL   HDL 36.70 (L) >39.00 mg/dL   VLDL 40.2 (H) 0.0 - 40.0 mg/dL   Total CHOL/HDL Ratio 5     Comment:                Men          Women1/2 Average Risk     3.4  3.3Average Risk          5.0          4.42X Average Risk          9.6          7.13X Average Risk          15.0          11.0                       NonHDL 129.30     Comment: NOTE:  Non-HDL goal should be 30 mg/dL higher than patient's LDL goal (i.e. LDL goal of < 70 mg/dL, would have non-HDL goal of < 100 mg/dL)  LDL cholesterol, direct     Status: None   Collection Time: 09/22/14  8:59 AM  Result Value Ref Range   Direct LDL 87.5 mg/dL    Comment: Optimal:  <100 mg/dLNear or Above Optimal:  100-129 mg/dLBorderline High:  130-159 mg/dLHigh:  160-189 mg/dLVery High:  >190 mg/dL  Urinalysis, Routine w reflex microscopic     Status: Abnormal   Collection Time: 09/22/14  8:59 AM  Result Value Ref Range   Color, Urine YELLOW Yellow;Lt. Yellow   APPearance CLEAR Clear   Specific Gravity, Urine 1.020 1.000-1.030   pH 5.5 5.0 - 8.0   Total Protein, Urine NEGATIVE Negative   Urine Glucose NEGATIVE Negative   Ketones, ur NEGATIVE Negative   Bilirubin Urine  NEGATIVE Negative   Hgb urine dipstick NEGATIVE Negative   Urobilinogen, UA 0.2 0.0 - 1.0   Leukocytes, UA NEGATIVE Negative   Nitrite NEGATIVE Negative   WBC, UA 0-2/hpf 0-2/hpf   RBC / HPF 0-2/hpf 0-2/hpf   Mucus, UA Presence of (A) None   Squamous Epithelial / LPF Rare(0-4/hpf) Rare(0-4/hpf)   Bacteria, UA Rare(<10/hpf) (A) None  CBG monitoring, ED     Status: Abnormal   Collection Time: 10/14/14  6:53 PM  Result Value Ref Range   Glucose-Capillary 103 (H) 70 - 99 mg/dL    Assessment/Plan: Essential hypertension, benign Medications refilled.  Take as directed.  DASH handout given.   Onychomycosis Recent liver function within normal limits.  Will begin oral Lamisil.  Vinegar nail soaks discussed.  Follow-up in 1 month for exam and LFTs.   Diabetes mellitus type II, uncontrolled AM glucose levels reported -- 100-120. Continue current regimen.  Will check A1C when due.  Diabetic foot exam complete with signs of onychomycosis.   Diabetic Foot Form - Detailed   Diabetic Foot Exam - detailed  Diabetic Foot exam was performed with the following findings:  Yes 10/26/2014  8:47 PM  Visual Foot Exam completed.:  Yes  Is there a history of foot ulcer?:  No  Can the patient see the bottom of their feet?:  Yes  Are the shoes appropriate in style and fit?:  Yes  Is there swelling or and abnormal foot shape?:  No  Are the toenails long?:  No  Are the toenails thick?:  Yes  Do you have pain in calf while walking?:  No  Is there a claw toe deformity?:  No  Is there elevated skin temparature?:  No  Is there limited skin dorsiflexion?:  No  Is there foot or ankle muscle weakness?:  No  Are the toenails ingrown?:  No  Normal Range of Motion:  Yes    Pulse Foot Exam completed.:  Yes  Right posterior Tibialias:  Present Left posterior Tibialias:  Present  Right Dorsalis Pedis:  Present Left Dorsalis Pedis:  Present  Sensory Foot Exam Completed.:  Yes  Swelling:  No  Semmes-Weinstein  Monofilament Test  R Foot Test Control:  Neg L Foot Test Control:  Neg  R Site 1-Great Toe:  Neg L Site 1-Great Toe:  Neg  R Site 4:  Neg L Site 4:  Neg  R Site 5:  Neg L Site 5:  Neg          PND (post-nasal drip) Increase fluids.  Use flonase daily. Keep a humidifier in the bedroom.  Rx hycodan for cough.  Return precautions discussed with patient.   Visit for preventive health examination I have reviewed the patient's medical history in detail and updated the computerized patient record.  Health Maintenance up-to-date.  Labs reviewed with patient.  Preventive care discussed.  Handout given.

## 2014-10-25 NOTE — Assessment & Plan Note (Signed)
The patient did not have clinically significant obstructive sleep apnea by her recent home sleep test, but her history is very suggestive of sleep disordered breathing. I have been concerned about her sleepiness during the day, especially with driving, but she tells me that her alertness has improved greatly since being on diabetes medication. She does not feel that sleepiness has a significant impact to her quality of life currently, and she obviously does not have severe sleep apnea that can impact her cardiovascular health. I have offered to repeat her study at the sleep Center to make sure that she does not have sleep apnea, but I do not think there will be a significant difference from her home sleep test. The patient would like to take the more conservative approach and keep working on weight loss since her alertness is so much improved since being on diabetic medication.

## 2014-10-25 NOTE — Patient Instructions (Signed)
Please continue medications as directed.  Start the Lamisil once daily for the nail fungus.  Follow-up with me in 1 month.  You will be contacted by Ophthalmology for assessment.  Follow-up with your GYN as scheduled.  Preventive Care for Adults A healthy lifestyle and preventive care can promote health and wellness. Preventive health guidelines for women include the following key practices.  A routine yearly physical is a good way to check with your health care provider about your health and preventive screening. It is a chance to share any concerns and updates on your health and to receive a thorough exam.  Visit your dentist for a routine exam and preventive care every 6 months. Brush your teeth twice a day and floss once a day. Good oral hygiene prevents tooth decay and gum disease.  The frequency of eye exams is based on your age, health, family medical history, use of contact lenses, and other factors. Follow your health care provider's recommendations for frequency of eye exams.  Eat a healthy diet. Foods like vegetables, fruits, whole grains, low-fat dairy products, and lean protein foods contain the nutrients you need without too many calories. Decrease your intake of foods high in solid fats, added sugars, and salt. Eat the right amount of calories for you.Get information about a proper diet from your health care provider, if necessary.  Regular physical exercise is one of the most important things you can do for your health. Most adults should get at least 150 minutes of moderate-intensity exercise (any activity that increases your heart rate and causes you to sweat) each week. In addition, most adults need muscle-strengthening exercises on 2 or more days a week.  Maintain a healthy weight. The body mass index (BMI) is a screening tool to identify possible weight problems. It provides an estimate of body fat based on height and weight. Your health care provider can find your BMI and can  help you achieve or maintain a healthy weight.For adults 20 years and older:  A BMI below 18.5 is considered underweight.  A BMI of 18.5 to 24.9 is normal.  A BMI of 25 to 29.9 is considered overweight.  A BMI of 30 and above is considered obese.  Maintain normal blood lipids and cholesterol levels by exercising and minimizing your intake of saturated fat. Eat a balanced diet with plenty of fruit and vegetables. Blood tests for lipids and cholesterol should begin at age 17 and be repeated every 5 years. If your lipid or cholesterol levels are high, you are over 50, or you are at high risk for heart disease, you may need your cholesterol levels checked more frequently.Ongoing high lipid and cholesterol levels should be treated with medicines if diet and exercise are not working.  If you smoke, find out from your health care provider how to quit. If you do not use tobacco, do not start.  Lung cancer screening is recommended for adults aged 16-80 years who are at high risk for developing lung cancer because of a history of smoking. A yearly low-dose CT scan of the lungs is recommended for people who have at least a 30-pack-year history of smoking and are a current smoker or have quit within the past 15 years. A pack year of smoking is smoking an average of 1 pack of cigarettes a day for 1 year (for example: 1 pack a day for 30 years or 2 packs a day for 15 years). Yearly screening should continue until the smoker has stopped smoking  for at least 15 years. Yearly screening should be stopped for people who develop a health problem that would prevent them from having lung cancer treatment.  If you are pregnant, do not drink alcohol. If you are breastfeeding, be very cautious about drinking alcohol. If you are not pregnant and choose to drink alcohol, do not have more than 1 drink per day. One drink is considered to be 12 ounces (355 mL) of beer, 5 ounces (148 mL) of wine, or 1.5 ounces (44 mL) of  liquor.  Avoid use of street drugs. Do not share needles with anyone. Ask for help if you need support or instructions about stopping the use of drugs.  High blood pressure causes heart disease and increases the risk of stroke. Your blood pressure should be checked at least every 1 to 2 years. Ongoing high blood pressure should be treated with medicines if weight loss and exercise do not work.  If you are 6-11 years old, ask your health care provider if you should take aspirin to prevent strokes.  Diabetes screening involves taking a blood sample to check your fasting blood sugar level. This should be done once every 3 years, after age 8, if you are within normal weight and without risk factors for diabetes. Testing should be considered at a younger age or be carried out more frequently if you are overweight and have at least 1 risk factor for diabetes.  Breast cancer screening is essential preventive care for women. You should practice "breast self-awareness." This means understanding the normal appearance and feel of your breasts and may include breast self-examination. Any changes detected, no matter how small, should be reported to a health care provider. Women in their 79s and 30s should have a clinical breast exam (CBE) by a health care provider as part of a regular health exam every 1 to 3 years. After age 78, women should have a CBE every year. Starting at age 83, women should consider having a mammogram (breast X-ray test) every year. Women who have a family history of breast cancer should talk to their health care provider about genetic screening. Women at a high risk of breast cancer should talk to their health care providers about having an MRI and a mammogram every year.  Breast cancer gene (BRCA)-related cancer risk assessment is recommended for women who have family members with BRCA-related cancers. BRCA-related cancers include breast, ovarian, tubal, and peritoneal cancers. Having  family members with these cancers may be associated with an increased risk for harmful changes (mutations) in the breast cancer genes BRCA1 and BRCA2. Results of the assessment will determine the need for genetic counseling and BRCA1 and BRCA2 testing.  Routine pelvic exams to screen for cancer are no longer recommended for nonpregnant women who are considered low risk for cancer of the pelvic organs (ovaries, uterus, and vagina) and who do not have symptoms. Ask your health care provider if a screening pelvic exam is right for you.  If you have had past treatment for cervical cancer or a condition that could lead to cancer, you need Pap tests and screening for cancer for at least 20 years after your treatment. If Pap tests have been discontinued, your risk factors (such as having a new sexual partner) need to be reassessed to determine if screening should be resumed. Some women have medical problems that increase the chance of getting cervical cancer. In these cases, your health care provider may recommend more frequent screening and Pap tests.  The  HPV test is an additional test that may be used for cervical cancer screening. The HPV test looks for the virus that can cause the cell changes on the cervix. The cells collected during the Pap test can be tested for HPV. The HPV test could be used to screen women aged 51 years and older, and should be used in women of any age who have unclear Pap test results. After the age of 45, women should have HPV testing at the same frequency as a Pap test.  Colorectal cancer can be detected and often prevented. Most routine colorectal cancer screening begins at the age of 50 years and continues through age 9 years. However, your health care provider may recommend screening at an earlier age if you have risk factors for colon cancer. On a yearly basis, your health care provider may provide home test kits to check for hidden blood in the stool. Use of a small camera at  the end of a tube, to directly examine the colon (sigmoidoscopy or colonoscopy), can detect the earliest forms of colorectal cancer. Talk to your health care provider about this at age 58, when routine screening begins. Direct exam of the colon should be repeated every 5-10 years through age 6 years, unless early forms of pre-cancerous polyps or small growths are found.  People who are at an increased risk for hepatitis B should be screened for this virus. You are considered at high risk for hepatitis B if:  You were born in a country where hepatitis B occurs often. Talk with your health care provider about which countries are considered high risk.  Your parents were born in a high-risk country and you have not received a shot to protect against hepatitis B (hepatitis B vaccine).  You have HIV or AIDS.  You use needles to inject street drugs.  You live with, or have sex with, someone who has hepatitis B.  You get hemodialysis treatment.  You take certain medicines for conditions like cancer, organ transplantation, and autoimmune conditions.  Hepatitis C blood testing is recommended for all people born from [birth year] through 1967 and any individual with known risks for hepatitis C.  Practice safe sex. Use condoms and avoid high-risk sexual practices to reduce the spread of sexually transmitted infections (STIs). STIs include gonorrhea, chlamydia, syphilis, trichomonas, herpes, HPV, and human immunodeficiency virus (HIV). Herpes, HIV, and HPV are viral illnesses that have no cure. They can result in disability, cancer, and death.  You should be screened for sexually transmitted illnesses (STIs) including gonorrhea and chlamydia if:  You are sexually active and are younger than 24 years.  You are older than 24 years and your health care provider tells you that you are at risk for this type of infection.  Your sexual activity has changed since you were last screened and you are at an increased  risk for chlamydia or gonorrhea. Ask your health care provider if you are at risk.  If you are at risk of being infected with HIV, it is recommended that you take a prescription medicine daily to prevent HIV infection. This is called preexposure prophylaxis (PrEP). You are considered at risk if:  You are a heterosexual woman, are sexually active, and are at increased risk for HIV infection.  You take drugs by injection.  You are sexually active with a partner who has HIV.  Talk with your health care provider about whether you are at high risk of being infected with HIV. If you choose  to begin PrEP, you should first be tested for HIV. You should then be tested every 3 months for as long as you are taking PrEP.  Osteoporosis is a disease in which the bones lose minerals and strength with aging. This can result in serious bone fractures or breaks. The risk of osteoporosis can be identified using a bone density scan. Women ages 24 years and over and women at risk for fractures or osteoporosis should discuss screening with their health care providers. Ask your health care provider whether you should take a calcium supplement or vitamin D to reduce the rate of osteoporosis.  Menopause can be associated with physical symptoms and risks. Hormone replacement therapy is available to decrease symptoms and risks. You should talk to your health care provider about whether hormone replacement therapy is right for you.  Use sunscreen. Apply sunscreen liberally and repeatedly throughout the day. You should seek shade when your shadow is shorter than you. Protect yourself by wearing long sleeves, pants, a wide-brimmed hat, and sunglasses year round, whenever you are outdoors.  Once a month, do a whole body skin exam, using a mirror to look at the skin on your back. Tell your health care provider of new moles, moles that have irregular borders, moles that are larger than a pencil eraser, or moles that have changed  in shape or color.  Stay current with required vaccines (immunizations).  Influenza vaccine. All adults should be immunized every year.  Tetanus, diphtheria, and acellular pertussis (Td, Tdap) vaccine. Pregnant women should receive 1 dose of Tdap vaccine during each pregnancy. The dose should be obtained regardless of the length of time since the last dose. Immunization is preferred during the 27th-36th week of gestation. An adult who has not previously received Tdap or who does not know her vaccine status should receive 1 dose of Tdap. This initial dose should be followed by tetanus and diphtheria toxoids (Td) booster doses every 10 years. Adults with an unknown or incomplete history of completing a 3-dose immunization series with Td-containing vaccines should begin or complete a primary immunization series including a Tdap dose. Adults should receive a Td booster every 10 years.  Varicella vaccine. An adult without evidence of immunity to varicella should receive 2 doses or a second dose if she has previously received 1 dose. Pregnant females who do not have evidence of immunity should receive the first dose after pregnancy. This first dose should be obtained before leaving the health care facility. The second dose should be obtained 4-8 weeks after the first dose.  Human papillomavirus (HPV) vaccine. Females aged 13-26 years who have not received the vaccine previously should obtain the 3-dose series. The vaccine is not recommended for use in pregnant females. However, pregnancy testing is not needed before receiving a dose. If a female is found to be pregnant after receiving a dose, no treatment is needed. In that case, the remaining doses should be delayed until after the pregnancy. Immunization is recommended for any person with an immunocompromised condition through the age of 61 years if she did not get any or all doses earlier. During the 3-dose series, the second dose should be obtained 4-8 weeks  after the first dose. The third dose should be obtained 24 weeks after the first dose and 16 weeks after the second dose.  Zoster vaccine. One dose is recommended for adults aged 26 years or older unless certain conditions are present.  Measles, mumps, and rubella (MMR) vaccine. Adults born before [birth year]  generally are considered immune to measles and mumps. Adults born in [birth year] or later should have 1 or more doses of MMR vaccine unless there is a contraindication to the vaccine or there is laboratory evidence of immunity to each of the three diseases. A routine second dose of MMR vaccine should be obtained at least 28 days after the first dose for students attending postsecondary schools, health care workers, or international travelers. People who received inactivated measles vaccine or an unknown type of measles vaccine during 1963-1967 should receive 2 doses of MMR vaccine. People who received inactivated mumps vaccine or an unknown type of mumps vaccine before 1979 and are at high risk for mumps infection should consider immunization with 2 doses of MMR vaccine. For females of childbearing age, rubella immunity should be determined. If there is no evidence of immunity, females who are not pregnant should be vaccinated. If there is no evidence of immunity, females who are pregnant should delay immunization until after pregnancy. Unvaccinated health care workers born before [birth year] who lack laboratory evidence of measles, mumps, or rubella immunity or laboratory confirmation of disease should consider measles and mumps immunization with 2 doses of MMR vaccine or rubella immunization with 1 dose of MMR vaccine.  Pneumococcal 13-valent conjugate (PCV13) vaccine. When indicated, a person who is uncertain of her immunization history and has no record of immunization should receive the PCV13 vaccine. An adult aged 32 years or older who has certain medical conditions and has not been previously immunized should receive 1  dose of PCV13 vaccine. This PCV13 should be followed with a dose of pneumococcal polysaccharide (PPSV23) vaccine. The PPSV23 vaccine dose should be obtained at least 8 weeks after the dose of PCV13 vaccine. An adult aged 58 years or older who has certain medical conditions and previously received 1 or more doses of PPSV23 vaccine should receive 1 dose of PCV13. The PCV13 vaccine dose should be obtained 1 or more years after the last PPSV23 vaccine dose.  Pneumococcal polysaccharide (PPSV23) vaccine. When PCV13 is also indicated, PCV13 should be obtained first. All adults aged 46 years and older should be immunized. An adult younger than age 65 years who has certain medical conditions should be immunized. Any person who resides in a nursing home or long-term care facility should be immunized. An adult smoker should be immunized. People with an immunocompromised condition and certain other conditions should receive both PCV13 and PPSV23 vaccines. People with human immunodeficiency virus (HIV) infection should be immunized as soon as possible after diagnosis. Immunization during chemotherapy or radiation therapy should be avoided. Routine use of PPSV23 vaccine is not recommended for American Indians, [place] Natives, or people younger than 65 years unless there are medical conditions that require PPSV23 vaccine. When indicated, people who have unknown immunization and have no record of immunization should receive PPSV23 vaccine. One-time revaccination 5 years after the first dose of PPSV23 is recommended for people aged 19-64 years who have chronic kidney failure, nephrotic syndrome, asplenia, or immunocompromised conditions. People who received 1-2 doses of PPSV23 before age 53 years should receive another dose of PPSV23 vaccine at age 37 years or later if at least 5 years have passed since the previous dose. Doses of PPSV23 are not needed for people immunized with PPSV23 at or after age 17 years.  Meningococcal  vaccine. Adults with asplenia or persistent complement component deficiencies should receive 2 doses of quadrivalent meningococcal conjugate (MenACWY-D) vaccine. The doses should be obtained at least 2 months apart.  Microbiologists working with certain meningococcal bacteria, [place] recruits, people at risk during an outbreak, and people who travel to or live in countries with a high rate of meningitis should be immunized. A first-year college student up through age 50 years who is living in a residence hall should receive a dose if she did not receive a dose on or after her 16th birthday. Adults who have certain high-risk conditions should receive one or more doses of vaccine.  Hepatitis A vaccine. Adults who wish to be protected from this disease, have certain high-risk conditions, work with hepatitis A-infected animals, work in hepatitis A research labs, or travel to or work in countries with a high rate of hepatitis A should be immunized. Adults who were previously unvaccinated and who anticipate close contact with an international adoptee during the first 60 days after arrival in the [place] from a country with a high rate of hepatitis A should be immunized.  Hepatitis B vaccine. Adults who wish to be protected from this disease, have certain high-risk conditions, may be exposed to blood or other infectious body fluids, are household contacts or sex partners of hepatitis B positive people, are clients or workers in certain care facilities, or travel to or work in countries with a high rate of hepatitis B should be immunized.  Haemophilus influenzae type b (Hib) vaccine. A previously unvaccinated person with asplenia or sickle cell disease or having a scheduled splenectomy should receive 1 dose of Hib vaccine. Regardless of previous immunization, a recipient of a hematopoietic stem cell transplant should receive a 3-dose series 6-12 months after her successful transplant. Hib vaccine is not  recommended for adults with HIV infection. Preventive Services / Frequency Ages 66 to 1 years  Blood pressure check.** / Every 1 to 2 years.  Lipid and cholesterol check.** / Every 5 years beginning at age 88.  Clinical breast exam.** / Every 3 years for women in their 36s and 50s.  BRCA-related cancer risk assessment.** / For women who have family members with a BRCA-related cancer (breast, ovarian, tubal, or peritoneal cancers).  Pap test.** / Every 2 years from ages 60 through 10. Every 3 years starting at age 87 through age 46 or 8 with a history of 3 consecutive normal Pap tests.  HPV screening.** / Every 3 years from ages 52 through ages 61 to 34 with a history of 3 consecutive normal Pap tests.  Hepatitis C blood test.** / For any individual with known risks for hepatitis C.  Skin self-exam. / Monthly.  Influenza vaccine. / Every year.  Tetanus, diphtheria, and acellular pertussis (Tdap, Td) vaccine.** / Consult your health care provider. Pregnant women should receive 1 dose of Tdap vaccine during each pregnancy. 1 dose of Td every 10 years.  Varicella vaccine.** / Consult your health care provider. Pregnant females who do not have evidence of immunity should receive the first dose after pregnancy.  HPV vaccine. / 3 doses over 6 months, if 58 and younger. The vaccine is not recommended for use in pregnant females. However, pregnancy testing is not needed before receiving a dose.  Measles, mumps, rubella (MMR) vaccine.** / You need at least 1 dose of MMR if you were born in 1957 or later. You may also need a 2nd dose. For females of childbearing age, rubella immunity should be determined. If there is no evidence of immunity, females who are not pregnant should be vaccinated. If there is no evidence of immunity, females who are pregnant should delay  immunization until after pregnancy.  Pneumococcal 13-valent conjugate (PCV13) vaccine.** / Consult your health care  provider.  Pneumococcal polysaccharide (PPSV23) vaccine.** / 1 to 2 doses if you smoke cigarettes or if you have certain conditions.  Meningococcal vaccine.** / 1 dose if you are age 49 to 18 years and a Market researcher living in a residence hall, or have one of several medical conditions, you need to get vaccinated against meningococcal disease. You may also need additional booster doses.  Hepatitis A vaccine.** / Consult your health care provider.  Hepatitis B vaccine.** / Consult your health care provider.  Haemophilus influenzae type b (Hib) vaccine.** / Consult your health care provider. Ages 54 to 61 years  Blood pressure check.** / Every 1 to 2 years.  Lipid and cholesterol check.** / Every 5 years beginning at age 1 years.  Lung cancer screening. / Every year if you are aged 66-80 years and have a 30-pack-year history of smoking and currently smoke or have quit within the past 15 years. Yearly screening is stopped once you have quit smoking for at least 15 years or develop a health problem that would prevent you from having lung cancer treatment.  Clinical breast exam.** / Every year after age 60 years.  BRCA-related cancer risk assessment.** / For women who have family members with a BRCA-related cancer (breast, ovarian, tubal, or peritoneal cancers).  Mammogram.** / Every year beginning at age 55 years and continuing for as long as you are in good health. Consult with your health care provider.  Pap test.** / Every 3 years starting at age 73 years through age 58 or 67 years with a history of 3 consecutive normal Pap tests.  HPV screening.** / Every 3 years from ages 15 years through ages 72 to 69 years with a history of 3 consecutive normal Pap tests.  Fecal occult blood test (FOBT) of stool. / Every year beginning at age 77 years and continuing until age 81 years. You may not need to do this test if you get a colonoscopy every 10 years.  Flexible sigmoidoscopy  or colonoscopy.** / Every 5 years for a flexible sigmoidoscopy or every 10 years for a colonoscopy beginning at age 69 years and continuing until age 102 years.  Hepatitis C blood test.** / For all people born from [birth year] through 1967 and any individual with known risks for hepatitis C.  Skin self-exam. / Monthly.  Influenza vaccine. / Every year.  Tetanus, diphtheria, and acellular pertussis (Tdap/Td) vaccine.** / Consult your health care provider. Pregnant women should receive 1 dose of Tdap vaccine during each pregnancy. 1 dose of Td every 10 years.  Varicella vaccine.** / Consult your health care provider. Pregnant females who do not have evidence of immunity should receive the first dose after pregnancy.  Zoster vaccine.** / 1 dose for adults aged 20 years or older.  Measles, mumps, rubella (MMR) vaccine.** / You need at least 1 dose of MMR if you were born in 1957 or later. You may also need a 2nd dose. For females of childbearing age, rubella immunity should be determined. If there is no evidence of immunity, females who are not pregnant should be vaccinated. If there is no evidence of immunity, females who are pregnant should delay immunization until after pregnancy.  Pneumococcal 13-valent conjugate (PCV13) vaccine.** / Consult your health care provider.  Pneumococcal polysaccharide (PPSV23) vaccine.** / 1 to 2 doses if you smoke cigarettes or if you have certain conditions.  Meningococcal vaccine.** /  Consult your health care provider.  Hepatitis A vaccine.** / Consult your health care provider.  Hepatitis B vaccine.** / Consult your health care provider.  Haemophilus influenzae type b (Hib) vaccine.** / Consult your health care provider. Ages 61 years and over  Blood pressure check.** / Every 1 to 2 years.  Lipid and cholesterol check.** / Every 5 years beginning at age 9 years.  Lung cancer screening. / Every year if you are aged 37-80 years and have a 30-pack-year history  of smoking and currently smoke or have quit within the past 15 years. Yearly screening is stopped once you have quit smoking for at least 15 years or develop a health problem that would prevent you from having lung cancer treatment.  Clinical breast exam.** / Every year after age 38 years.  BRCA-related cancer risk assessment.** / For women who have family members with a BRCA-related cancer (breast, ovarian, tubal, or peritoneal cancers).  Mammogram.** / Every year beginning at age 106 years and continuing for as long as you are in good health. Consult with your health care provider.  Pap test.** / Every 3 years starting at age 59 years through age 90 or 10 years with 3 consecutive normal Pap tests. Testing can be stopped between 65 and 70 years with 3 consecutive normal Pap tests and no abnormal Pap or HPV tests in the past 10 years.  HPV screening.** / Every 3 years from ages 45 years through ages 67 or 5 years with a history of 3 consecutive normal Pap tests. Testing can be stopped between 65 and 70 years with 3 consecutive normal Pap tests and no abnormal Pap or HPV tests in the past 10 years.  Fecal occult blood test (FOBT) of stool. / Every year beginning at age 20 years and continuing until age 71 years. You may not need to do this test if you get a colonoscopy every 10 years.  Flexible sigmoidoscopy or colonoscopy.** / Every 5 years for a flexible sigmoidoscopy or every 10 years for a colonoscopy beginning at age 26 years and continuing until age 50 years.  Hepatitis C blood test.** / For all people born from [birth year] through 1967 and any individual with known risks for hepatitis C.  Osteoporosis screening.** / A one-time screening for women ages 67 years and over and women at risk for fractures or osteoporosis.  Skin self-exam. / Monthly.  Influenza vaccine. / Every year.  Tetanus, diphtheria, and acellular pertussis (Tdap/Td) vaccine.** / 1 dose of Td every 10 years.  Varicella  vaccine.** / Consult your health care provider.  Zoster vaccine.** / 1 dose for adults aged 79 years or older.  Pneumococcal 13-valent conjugate (PCV13) vaccine.** / Consult your health care provider.  Pneumococcal polysaccharide (PPSV23) vaccine.** / 1 dose for all adults aged 53 years and older.  Meningococcal vaccine.** / Consult your health care provider.  Hepatitis A vaccine.** / Consult your health care provider.  Hepatitis B vaccine.** / Consult your health care provider.  Haemophilus influenzae type b (Hib) vaccine.** / Consult your health care provider. ** Family history and personal history of risk and conditions may change your health care provider's recommendations. Document Released: 11/25/2001 Document Revised: 02/13/2014 Document Reviewed: 02/24/2011 [facility] Patient Information 2015 [place], [place]. This information is not intended to replace advice given to you by your health care provider. Make sure you discuss any questions you have with your health care provider.

## 2014-10-25 NOTE — Progress Notes (Signed)
   Subjective:    Patient ID: Angel Dawson, female    DOB: 1981-01-19, 34 y.o.   MRN: [id number]  HPI Patient comes in today for follow-up of her recent home sleep test. She was found to have small numbers of obstructive events with an AHI of only 4.4 events per hour. I have explained this does not meet the criteria for the diagnosis of obstructive sleep apnea. However, I have also told her that she can have night to night variability in her sleep disordered breathing, and that a home sleep test can underestimate her degree of sleep apnea. However, it's not going to miss moderate to severe sleep apnea. I have reviewed the study with her in detail, and answered all of her questions.   Review of Systems  Constitutional: Negative for fever and unexpected weight change.  HENT: Negative for congestion, dental problem, ear pain, nosebleeds, postnasal drip, rhinorrhea, sinus pressure, sneezing, sore throat and trouble swallowing.   Eyes: Negative for redness and itching.  Respiratory: Negative for cough, chest tightness, shortness of breath and wheezing.   Cardiovascular: Negative for palpitations and leg swelling.  Gastrointestinal: Negative for nausea and vomiting.  Genitourinary: Negative for dysuria.  Musculoskeletal: Negative for joint swelling.  Skin: Negative for rash.  Neurological: Negative for headaches.  Hematological: Does not bruise/bleed easily.  Psychiatric/Behavioral: Negative for dysphoric mood. The patient is not nervous/anxious.        Objective:   Physical Exam Morbidly obese female in no acute distress Nose without purulence or discharge noted Neck without lymphadenopathy or thyromegaly Lower extremities with mild edema, no cyanosis Alert and oriented, moves all 4 extremities.       Assessment & Plan:

## 2014-10-25 NOTE — Progress Notes (Signed)
Pre visit review using our clinic review tool, if applicable. No additional management support is needed unless otherwise documented below in the visit note/SLS  

## 2014-10-26 DIAGNOSIS — B351 Tinea unguium: Secondary | ICD-10-CM | POA: Insufficient documentation

## 2014-10-26 DIAGNOSIS — R0982 Postnasal drip: Secondary | ICD-10-CM | POA: Insufficient documentation

## 2014-10-26 DIAGNOSIS — Z0000 Encounter for general adult medical examination without abnormal findings: Secondary | ICD-10-CM | POA: Insufficient documentation

## 2014-10-26 NOTE — Assessment & Plan Note (Signed)
Increase fluids.  Use flonase daily. Keep a humidifier in the bedroom.  Rx hycodan for cough.  Return precautions discussed with patient.

## 2014-10-26 NOTE — Assessment & Plan Note (Signed)
Medications refilled.  Take as directed.  DASH handout given.

## 2014-10-26 NOTE — Assessment & Plan Note (Signed)
Recent liver function within normal limits.  Will begin oral Lamisil.  Vinegar nail soaks discussed.  Follow-up in 1 month for exam and LFTs.

## 2014-10-26 NOTE — Assessment & Plan Note (Addendum)
AM glucose levels reported -- 100-120. Continue current regimen.  Will check A1C when due.  Diabetic foot exam complete with signs of onychomycosis.   Diabetic Foot Form - Detailed   Diabetic Foot Exam - detailed  Diabetic Foot exam was performed with the following findings:  Yes 10/26/2014  8:47 PM  Visual Foot Exam completed.:  Yes  Is there a history of foot ulcer?:  No  Can the patient see the bottom of their feet?:  Yes  Are the shoes appropriate in style and fit?:  Yes  Is there swelling or and abnormal foot shape?:  No  Are the toenails long?:  No  Are the toenails thick?:  Yes  Do you have pain in calf while walking?:  No  Is there a claw toe deformity?:  No  Is there elevated skin temparature?:  No  Is there limited skin dorsiflexion?:  No  Is there foot or ankle muscle weakness?:  No  Are the toenails ingrown?:  No  Normal Range of Motion:  Yes    Pulse Foot Exam completed.:  Yes  Right posterior Tibialias:  Present Left posterior Tibialias:  Present  Right Dorsalis Pedis:  Present Left Dorsalis Pedis:  Present  Sensory Foot Exam Completed.:  Yes  Swelling:  No  Semmes-Weinstein Monofilament Test  R Foot Test Control:  Neg L Foot Test Control:  Neg  R Site 1-Great Toe:  Neg L Site 1-Great Toe:  Neg  R Site 4:  Neg L Site 4:  Neg  R Site 5:  Neg L Site 5:  Neg

## 2014-10-26 NOTE — Assessment & Plan Note (Signed)
I have reviewed the patient's medical history in detail and updated the computerized patient record.  Health Maintenance up-to-date.  Labs reviewed with patient.  Preventive care discussed.  Handout given.

## 2014-10-30 ENCOUNTER — Encounter: Payer: Self-pay | Admitting: Physician Assistant

## 2014-10-31 ENCOUNTER — Other Ambulatory Visit: Payer: Self-pay | Admitting: Physician Assistant

## 2014-10-31 DIAGNOSIS — IMO0002 Reserved for concepts with insufficient information to code with codable children: Secondary | ICD-10-CM

## 2014-10-31 DIAGNOSIS — E1165 Type 2 diabetes mellitus with hyperglycemia: Secondary | ICD-10-CM

## 2014-10-31 MED ORDER — METFORMIN HCL 500 MG PO TABS: 500.0000 mg | ORAL_TABLET | Freq: Two times a day (BID) | ORAL | Status: DC

## 2014-11-02 ENCOUNTER — Telehealth: Payer: Self-pay

## 2014-11-02 DIAGNOSIS — K219 Gastro-esophageal reflux disease without esophagitis: Secondary | ICD-10-CM

## 2014-11-02 DIAGNOSIS — E1165 Type 2 diabetes mellitus with hyperglycemia: Secondary | ICD-10-CM

## 2014-11-02 DIAGNOSIS — IMO0002 Reserved for concepts with insufficient information to code with codable children: Secondary | ICD-10-CM

## 2014-11-02 MED ORDER — OMEPRAZOLE 20 MG PO CPDR: 20.0000 mg | DELAYED_RELEASE_CAPSULE | Freq: Every day | ORAL | Status: DC

## 2014-11-02 MED ORDER — METFORMIN HCL 500 MG PO TABS: 500.0000 mg | ORAL_TABLET | Freq: Two times a day (BID) | ORAL | Status: DC

## 2014-11-02 NOTE — Telephone Encounter (Signed)
Received fax from [facility] regarding 90 day supply Rx request for the following meds:  Metformin HCL 500 mg  Omeprazole 20 mg   Meds reordered.

## 2014-11-15 LAB — HM DIABETES EYE EXAM

## 2014-11-17 ENCOUNTER — Ambulatory Visit: Payer: BLUE CROSS/BLUE SHIELD | Admitting: Physician Assistant

## 2014-11-20 ENCOUNTER — Encounter: Payer: Self-pay | Admitting: Physician Assistant

## 2014-12-25 ENCOUNTER — Encounter: Payer: Self-pay | Admitting: Physician Assistant

## 2014-12-25 ENCOUNTER — Telehealth: Payer: Self-pay | Admitting: Physician Assistant

## 2014-12-25 DIAGNOSIS — B351 Tinea unguium: Secondary | ICD-10-CM

## 2014-12-25 DIAGNOSIS — R899 Unspecified abnormal finding in specimens from other organs, systems and tissues: Secondary | ICD-10-CM

## 2014-12-25 NOTE — Telephone Encounter (Signed)
Order placed.  Does not need to fast.

## 2014-12-25 NOTE — Telephone Encounter (Signed)
Please Advise

## 2014-12-25 NOTE — Telephone Encounter (Signed)
Caller name: torre Relation to pt: self Call back number: [phone]  Pharmacy:  Reason for call:   Patient scheduled liver function test. Per mychart message. Please order. She wants to know if she needs to be fasting

## 2014-12-26 NOTE — Telephone Encounter (Signed)
Informed patient of this.  °

## 2014-12-27 ENCOUNTER — Other Ambulatory Visit: Payer: BLUE CROSS/BLUE SHIELD

## 2014-12-29 ENCOUNTER — Other Ambulatory Visit (INDEPENDENT_AMBULATORY_CARE_PROVIDER_SITE_OTHER): Payer: BLUE CROSS/BLUE SHIELD

## 2014-12-29 DIAGNOSIS — R899 Unspecified abnormal finding in specimens from other organs, systems and tissues: Secondary | ICD-10-CM

## 2014-12-29 LAB — HEPATIC FUNCTION PANEL
ALT: 19 U/L (ref 0–35)
AST: 20 U/L (ref 0–37)
Albumin: 4.2 g/dL (ref 3.5–5.2)
Alkaline Phosphatase: 75 U/L (ref 39–117)
BILIRUBIN DIRECT: 0.1 mg/dL (ref 0.0–0.3)
BILIRUBIN TOTAL: 0.3 mg/dL (ref 0.2–1.2)
Total Protein: 7.3 g/dL (ref 6.0–8.3)

## 2014-12-29 MED ORDER — TERBINAFINE HCL 250 MG PO TABS: 250.0000 mg | ORAL_TABLET | Freq: Every day | ORAL | Status: DC

## 2015-01-15 ENCOUNTER — Encounter: Payer: BLUE CROSS/BLUE SHIELD | Attending: Physician Assistant

## 2015-01-15 DIAGNOSIS — Z713 Dietary counseling and surveillance: Secondary | ICD-10-CM | POA: Diagnosis not present

## 2015-01-15 DIAGNOSIS — IMO0002 Reserved for concepts with insufficient information to code with codable children: Secondary | ICD-10-CM

## 2015-01-15 DIAGNOSIS — E1165 Type 2 diabetes mellitus with hyperglycemia: Secondary | ICD-10-CM | POA: Insufficient documentation

## 2015-01-16 NOTE — Progress Notes (Signed)
Appt start time: 1730 end time:  1830.  Patient was seen on 01/15/2015 for a review of the series of three diabetes self-management courses at the [facility]. The following learning objectives were met by the patient during this class:  . Reviewed blood glucose monitoring and interpretation including the recommended target ranges and Hgb A1c.  . Reviewed on carb counting, importance of regularly scheduled meals/snacks, and meal planning.  . Reviewed the effects of physical activity on glucose levels and long-term glucose control.  Recommended goal of 150 minutes of physical activity/week. . Reviewed patient medications and discussed role of medication on blood glucose and possible side effects. . Discussed strategies to manage stress, psychosocial issues, and other obstacles to diabetes management. . Encouraged moderate weight reduction to improve glucose levels.   . Reviewed short-term complications: hyper- and hypo-glycemia.  Discussed causes, symptoms, and treatment options. . Reviewed prevention, detection, and treatment of long-term complications.  Discussed the role of prolonged elevated glucose levels on body systems.  Goals:  Follow Diabetes Meal Plan as instructed  Eat 3 meals and 2 snacks, every 3-5 hrs  Limit carbohydrate intake to 45 grams carbohydrate/meal Limit carbohydrate intake to 15 grams carbohydrate/snack Add lean protein foods to meals/snacks  Monitor glucose levels as instructed by your doctor  Aim for goal of 15-30 mins of physical activity daily as tolerated  Bring food record and glucose log to your next nutrition visit

## 2015-01-20 ENCOUNTER — Telehealth: Payer: Self-pay | Admitting: Physician Assistant

## 2015-01-21 MED ORDER — INDOMETHACIN 20 MG PO CAPS: 20.0000 mg | ORAL_CAPSULE | Freq: Two times a day (BID) | ORAL | Status: DC | PRN

## 2015-01-22 MED ORDER — INDOMETHACIN 25 MG PO CAPS: 25.0000 mg | ORAL_CAPSULE | Freq: Two times a day (BID) | ORAL | Status: DC

## 2015-01-22 NOTE — Telephone Encounter (Signed)
Rx request to pharmacy; see My Chart return message/SLS

## 2015-01-22 NOTE — Telephone Encounter (Signed)
Requesting generic 25mg . 90 day supply

## 2015-01-22 NOTE — Addendum Note (Signed)
Addended by: Rockwell Germany on: 01/22/2015 04:51 PM   Modules accepted: Orders

## 2015-01-22 NOTE — Telephone Encounter (Signed)
Patient states that this should have been 25mg ?? Best # [phone]

## 2015-01-29 ENCOUNTER — Telehealth: Payer: Self-pay | Admitting: *Deleted

## 2015-01-29 NOTE — Telephone Encounter (Signed)
Prior authorization for terbinafine initiated. Awaiting determination. JG//CMA

## 2015-02-07 ENCOUNTER — Other Ambulatory Visit: Payer: Self-pay | Admitting: Physician Assistant

## 2015-04-06 ENCOUNTER — Ambulatory Visit (INDEPENDENT_AMBULATORY_CARE_PROVIDER_SITE_OTHER): Payer: BLUE CROSS/BLUE SHIELD | Admitting: Physician Assistant

## 2015-04-06 ENCOUNTER — Encounter: Payer: Self-pay | Admitting: Physician Assistant

## 2015-04-06 VITALS — BP 146/82 | HR 91 | Temp 98.3°F | Ht 71.0 in | Wt >= 6400 oz

## 2015-04-06 DIAGNOSIS — IMO0002 Reserved for concepts with insufficient information to code with codable children: Secondary | ICD-10-CM

## 2015-04-06 DIAGNOSIS — E1165 Type 2 diabetes mellitus with hyperglycemia: Secondary | ICD-10-CM

## 2015-04-06 DIAGNOSIS — I10 Essential (primary) hypertension: Secondary | ICD-10-CM

## 2015-04-06 LAB — BASIC METABOLIC PANEL
BUN: 11 mg/dL (ref 6–23)
CALCIUM: 9.7 mg/dL (ref 8.4–10.5)
CHLORIDE: 105 meq/L (ref 96–112)
CO2: 27 mEq/L (ref 19–32)
CREATININE: 0.92 mg/dL (ref 0.40–1.20)
GFR: 88.93 mL/min (ref >60.00)
Glucose, Bld: 131 mg/dL — ABNORMAL HIGH (ref 70–99)
Potassium: 3.9 mEq/L (ref 3.5–5.1)
Sodium: 138 mEq/L (ref 135–145)

## 2015-04-06 LAB — HEMOGLOBIN A1C: Hgb A1c MFr Bld: 6 % (ref 4.6–6.5)

## 2015-04-06 NOTE — Assessment & Plan Note (Signed)
Will check BMP and A1C today. Endorsed fasting CBGs at 120s. Hopefully A1C will correlate.  Dietary and exercise measures reviewed.  Continue Metformin BID.  Will alter regimen based on results.

## 2015-04-06 NOTE — Progress Notes (Signed)
Pre visit review using our clinic review tool, if applicable. No additional management support is needed unless otherwise documented below in the visit note. 

## 2015-04-06 NOTE — Progress Notes (Signed)
Patient presents to clinic today for overdue follow-up of diabetes mellitus, II, previously uncontrolled. Patient currently on Metformin 500 mg BID. Has been taking twice daily as directed. Endorses fasting CBGs 120s-130s.  Last A1C at 10.7. Patient was due for follow-up >4 months ago.  Endorses taking her BP medications daily. Patient denies chest pain, palpitations, lightheadedness, dizziness, vision changes or frequent headaches. Has not taken medication this morning. Endorses poor diet recently but has removed all sweet tea from her diet.  BP Readings from Last 3 Encounters:  04/06/15 146/82  10/25/14 124/78  10/25/14 146/90   Past Medical History  Diagnosis Date  . Hypertension   . GERD (gastroesophageal reflux disease)   . Abdominal pain     related to gallstones  . Gallstones   . Sinus congestion     occ. uses OTC meds for this  . Pancreatitis   . DKA (diabetic ketoacidoses) 08/25/2014  . Diabetes   . Headache     Current Outpatient Prescriptions on File Prior to Visit  Medication Sig Dispense Refill  . Blood Glucose Monitoring Suppl (BLOOD GLUCOSE METER KIT AND SUPPLIES) Dispense based on patient and insurance preference. Use up to four times daily as directed. (FOR ICD-9 250.00, 250.01). 1 each 0  . carvedilol (COREG) 12.5 MG tablet Take 1 tablet (12.5 mg total) by mouth 2 (two) times daily with a meal. 180 tablet 1  . glucose blood (ONE TOUCH ULTRA TEST) test strip Check once daily. Dx:E11.9 100 each 0  . ibuprofen (ADVIL,MOTRIN) 200 MG tablet Take 400 mg by mouth daily as needed (body aches/pain).    Marland Kitchen lisinopril-hydrochlorothiazide (PRINZIDE,ZESTORETIC) 20-25 MG per tablet Take 1 tablet by mouth daily. 90 tablet 1  . metFORMIN (GLUCOPHAGE) 500 MG tablet TAKE 1 TABLET TWICE A DAY WITH MEALS 180 tablet 0  . Multiple Vitamin (MULTIVITAMIN WITH MINERALS) TABS tablet Take 1 tablet by mouth daily.    . NON FORMULARY Nettipot Nasal    . omeprazole (PRILOSEC) 20 MG capsule  TAKE 1 CAPSULE DAILY 90 capsule 1  . ONETOUCH DELICA LANCETS 66U MISC Use once daily to check glucose. 100 each 1  . Polyethyl Glycol-Propyl Glycol (SYSTANE OP) Place 1 drop into both eyes daily as needed (dry eyes).    . indomethacin (INDOCIN) 25 MG capsule Take 1 capsule (25 mg total) by mouth 2 (two) times daily with a meal. (Patient not taking: Reported on 04/06/2015) 60 capsule 0   No current facility-administered medications on file prior to visit.    No Known Allergies  Family History  Problem Relation Age of Onset  . Hypertension Mother   . Diabetes Mother   . Hypertension Father   . Diabetes Father   . Stroke Paternal Grandfather   . Lung cancer Father   . Asthma      History   Social History  . Marital Status: Single    Spouse Name: N/A  . Number of Children: 0  . Years of Education: N/A   Occupational History  . research/scientist    Social History Main Topics  . Smoking status: Never Smoker   . Smokeless tobacco: Never Used  . Alcohol Use: 0.0 oz/week    0 Standard drinks or equivalent per week     Comment: 1 drink per month  . Drug Use: No  . Sexual Activity: No   Other Topics Concern  . None   Social History Narrative   Review of Systems - See HPI.  All other  ROS are negative.  BP 146/82 mmHg  Pulse 91  Temp(Src) 98.3 F (36.8 C) (Oral)  Ht 5' 11" (1.803 m)  Wt 417 lb 6.4 oz (189.331 kg)  BMI 58.24 kg/m2  SpO2 100%  LMP 03/18/2015  Physical Exam  Constitutional: She is oriented to person, place, and time and well-developed, well-nourished, and in no distress.  HENT:  Head: Normocephalic and atraumatic.  Eyes: Conjunctivae are normal.  Neck: Neck supple.  Cardiovascular: Normal rate, regular rhythm, normal heart sounds and intact distal pulses.   Pulmonary/Chest: Effort normal and breath sounds normal. No respiratory distress. She has no wheezes. She has no rales. She exhibits no tenderness.  Neurological: She is alert and oriented to  person, place, and time.  Skin: Skin is warm and dry. No rash noted.  Psychiatric: Affect normal.  Vitals reviewed.  Assessment/Plan: Hypertension Continue current regimen.  Encouraged patient to take BP medication before appointments.  DASH diet again encouraged. Will reassess BP at next visit.  Diabetes mellitus type II, uncontrolled Will check BMP and A1C today. Endorsed fasting CBGs at 120s. Hopefully A1C will correlate.  Dietary and exercise measures reviewed.  Continue Metformin BID.  Will alter regimen based on results.

## 2015-04-06 NOTE — Assessment & Plan Note (Signed)
Continue current regimen.  Encouraged patient to take BP medication before appointments.  DASH diet again encouraged. Will reassess BP at next visit.

## 2015-04-06 NOTE — Patient Instructions (Signed)
Please continue medications as directed,making sure to take BP medication before your appointments.  Limit salt intake and stay active.  Stop by the lab for blood work. I will call you with your results.  We will alter your regimen based on results.  Diabetes and Exercise Exercising regularly is important. It is not just about losing weight. It has many health benefits, such as:  Improving your overall fitness, flexibility, and endurance.  Increasing your bone density.  Helping with weight control.  Decreasing your body fat.  Increasing your muscle strength.  Reducing stress and tension.  Improving your overall health. People with diabetes who exercise gain additional benefits because exercise:  Reduces appetite.  Improves the body's use of blood sugar (glucose).  Helps lower or control blood glucose.  Decreases blood pressure.  Helps control blood lipids (such as cholesterol and triglycerides).  Improves the body's use of the hormone insulin by:  Increasing the body's insulin sensitivity.  Reducing the body's insulin needs.  Decreases the risk for heart disease because exercising:  Lowers cholesterol and triglycerides levels.  Increases the levels of good cholesterol (such as high-density lipoproteins [HDL]) in the body.  Lowers blood glucose levels. YOUR ACTIVITY PLAN  Choose an activity that you enjoy and set realistic goals. Your health care provider or diabetes educator can help you make an activity plan that works for you. Exercise regularly as directed by your health care provider. This includes:  Performing resistance training twice a week such as push-ups, sit-ups, lifting weights, or using resistance bands.  Performing 150 minutes of cardio exercises each week such as walking, running, or playing sports.  Staying active and spending no more than 90 minutes at one time being inactive. Even short bursts of exercise are good for you. Three 10-minute  sessions spread throughout the day are just as beneficial as a single 30-minute session. Some exercise ideas include:  Taking the dog for a walk.  Taking the stairs instead of the elevator.  Dancing to your favorite song.  Doing an exercise video.  Doing your favorite exercise with a friend. RECOMMENDATIONS FOR EXERCISING WITH TYPE 1 OR TYPE 2 DIABETES   Check your blood glucose before exercising. If blood glucose levels are greater than 240 mg/dL, check for urine ketones. Do not exercise if ketones are present.  Avoid injecting insulin into areas of the body that are going to be exercised. For example, avoid injecting insulin into:  The arms when playing tennis.  The legs when jogging.  Keep a record of:  Food intake before and after you exercise.  Expected peak times of insulin action.  Blood glucose levels before and after you exercise.  The type and amount of exercise you have done.  Review your records with your health care provider. Your health care provider will help you to develop guidelines for adjusting food intake and insulin amounts before and after exercising.  If you take insulin or oral hypoglycemic agents, watch for signs and symptoms of hypoglycemia. They include:  Dizziness.  Shaking.  Sweating.  Chills.  Confusion.  Drink plenty of water while you exercise to prevent dehydration or heat stroke. Body water is lost during exercise and must be replaced.  Talk to your health care provider before starting an exercise program to make sure it is safe for you. Remember, almost any type of activity is better than none. Document Released: 12/20/2003 Document Revised: 02/13/2014 Document Reviewed: 03/08/2013 [facility] Patient Information 2015 [place], [place]. This information is not intended  to replace advice given to you by your health care provider. Make sure you discuss any questions you have with your health care provider.  

## 2015-04-15 ENCOUNTER — Other Ambulatory Visit: Payer: Self-pay | Admitting: Physician Assistant

## 2015-05-16 ENCOUNTER — Encounter: Payer: Self-pay | Admitting: Physician Assistant

## 2015-05-16 DIAGNOSIS — E119 Type 2 diabetes mellitus without complications: Secondary | ICD-10-CM

## 2015-06-07 ENCOUNTER — Encounter: Payer: Self-pay | Admitting: Endocrinology

## 2015-06-07 ENCOUNTER — Ambulatory Visit (INDEPENDENT_AMBULATORY_CARE_PROVIDER_SITE_OTHER): Payer: BLUE CROSS/BLUE SHIELD | Admitting: Endocrinology

## 2015-06-07 VITALS — BP 124/80 | HR 89 | Temp 98.0°F | Ht 71.0 in | Wt >= 6400 oz

## 2015-06-07 DIAGNOSIS — E1165 Type 2 diabetes mellitus with hyperglycemia: Secondary | ICD-10-CM

## 2015-06-07 DIAGNOSIS — IMO0002 Reserved for concepts with insufficient information to code with codable children: Secondary | ICD-10-CM

## 2015-06-07 LAB — POCT GLYCOSYLATED HEMOGLOBIN (HGB A1C): HEMOGLOBIN A1C: 6.2

## 2015-06-07 NOTE — Patient Instructions (Addendum)
good diet and exercise significantly improve the control of your diabetes.  please let me know if you wish to be referred to a dietician.  high blood sugar is very risky to your health.  you should see an eye doctor and dentist every year.  It is very important to get all recommended vaccinations.  controlling your blood pressure and cholesterol drastically reduces the damage diabetes does to your body.  Those who smoke should quit.  please discuss these with your doctor.    Please continue the same metformin. Please consider the weight loss surgery, and let me know if you decide to go ahead.   Please let me know if you want to take "spironolactone" (we would need to adjust the lisinopril-HCTZ).   Please come back for a follow-up appointment in 6 months.  blood tests are requested for you today.  We'll let you know about the results.

## 2015-06-07 NOTE — Progress Notes (Signed)
Subjective:    Patient ID: Angel Dawson, female    DOB: 07/21/1981, 34 y.o.   MRN: [id number]  HPI pt states DM was dx'ed in 2015; she has mild if any neuropathy of the lower extremities; she is unaware of any associated chronic complications; he has never been on insulin; pt says her diet and exercise are; she has never had GDM, severe hypoglycemia or DKA.  She had pancreatitis in 2014, possible gallstone-related.  She was admitted with glucose in the 400's (but not DKA) in late 2015.  She was rx'ed metformin.  She has never attempted pregnancy.  She has had irreg menses (better with metformin.  She did not tolerate OC's (nausea).   Past Medical History  Diagnosis Date  . Hypertension   . GERD (gastroesophageal reflux disease)   . Abdominal pain     related to gallstones  . Gallstones   . Sinus congestion     occ. uses OTC meds for this  . Pancreatitis   . DKA (diabetic ketoacidoses) 08/25/2014  . Diabetes   . Headache     Past Surgical History  Procedure Laterality Date  . Tonsillectomy    . Cholecystectomy N/A 06/22/2013    Procedure: LAPAROSCOPIC CHOLECYSTECTOMY ;  Surgeon: Adin Hector, MD;  Location: [facility];  Service: General;  Laterality: N/A;    Social History   Social History  . Marital Status: Single    Spouse Name: N/A  . Number of Children: 0  . Years of Education: N/A   Occupational History  . research/scientist    Social History Main Topics  . Smoking status: Never Smoker   . Smokeless tobacco: Never Used  . Alcohol Use: 0.0 oz/week    0 Standard drinks or equivalent per week     Comment: 1 drink per month  . Drug Use: No  . Sexual Activity: No   Other Topics Concern  . Not on file   Social History Narrative    Current Outpatient Prescriptions on File Prior to Visit  Medication Sig Dispense Refill  . Blood Glucose Monitoring Suppl (BLOOD GLUCOSE METER KIT AND SUPPLIES) Dispense based on patient and insurance preference. Use up to four times  daily as directed. (FOR ICD-9 250.00, 250.01). 1 each 0  . carvedilol (COREG) 12.5 MG tablet TAKE 1 TABLET TWICE A DAY WITH MEALS 180 tablet 1  . glucose blood (ONE TOUCH ULTRA TEST) test strip Check once daily. Dx:E11.9 100 each 0  . ibuprofen (ADVIL,MOTRIN) 200 MG tablet Take 400 mg by mouth daily as needed (body aches/pain).    . indomethacin (INDOCIN) 25 MG capsule Take 1 capsule (25 mg total) by mouth 2 (two) times daily with a meal. 60 capsule 0  . lisinopril-hydrochlorothiazide (PRINZIDE,ZESTORETIC) 20-25 MG per tablet Take 1 tablet by mouth daily. 90 tablet 1  . metFORMIN (GLUCOPHAGE) 500 MG tablet TAKE 1 TABLET TWICE A DAY WITH MEALS (Patient taking differently: TAKE 1 TABLET ONE TIME A DAY WITH MEALS) 180 tablet 0  . NON FORMULARY Nettipot Nasal    . omeprazole (PRILOSEC) 20 MG capsule TAKE 1 CAPSULE DAILY 90 capsule 1  . ONETOUCH DELICA LANCETS 54G MISC Use once daily to check glucose. 100 each 1  . Polyethyl Glycol-Propyl Glycol (SYSTANE OP) Place 1 drop into both eyes daily as needed (dry eyes).     No current facility-administered medications on file prior to visit.    No Known Allergies  Family History  Problem Relation Age of Onset  .  Hypertension Mother   . Diabetes Mother   . Hypertension Father   . Diabetes Father   . Stroke Paternal Grandfather   . Lung cancer Father   . Asthma     BP 124/80 mmHg  Pulse 89  Temp(Src) 98 F (36.7 C) (Oral)  Ht 5' 11"  (1.803 m)  Wt 414 lb (187.789 kg)  BMI 57.77 kg/m2  SpO2 99%  Review of Systems denies weight loss, blurry vision, headache, chest pain, sob, n/v, urinary frequency, muscle cramps, depression, cold intolerance, rhinorrhea, and easy bruising.  she has excessive diaphoresis.  She has hair growth on the face.      Objective:   Physical Exam VS: see vs page GEN: no distress.  Morbid obesity.   HEAD: head: no deformity eyes: no periorbital swelling, no proptosis external nose and ears are normal mouth: no  lesion seen NECK: supple, thyroid is not enlarged CHEST WALL: no deformity LUNGS:  Clear to auscultation CV: reg rate and rhythm, no murmur MUSCULOSKELETAL: muscle bulk and strength are grossly normal.  no obvious joint swelling.  gait is normal and steady EXTEMITIES: no deformity.  no ulcer on the feet.  feet are of normal color and temp.  no edema PULSES: dorsalis pedis intact bilat.  no carotid bruit NEURO:  cn 2-12 grossly intact.   readily moves all 4's.  sensation is intact to touch on the feet SKIN:  Normal texture and temperature.  No rash or suspicious lesion is visible.  Acanthosis nigricans on the neck. No terminal hair is noted on the face. NODES:  None palpable at the neck PSYCH: alert, well-oriented.  Does not appear anxious nor depressed.    Lab Results  Component Value Date   HGBA1C 6.0 04/06/2015   today: A1c=6.2%    Assessment & Plan:  DM: well-controlled. PCO, new: we discussed.  I offered additional rx.  Pt says she'll consider.    Patient is advised the following: Patient Instructions  good diet and exercise significantly improve the control of your diabetes.  please let me know if you wish to be referred to a dietician.  high blood sugar is very risky to your health.  you should see an eye doctor and dentist every year.  It is very important to get all recommended vaccinations.  controlling your blood pressure and cholesterol drastically reduces the damage diabetes does to your body.  Those who smoke should quit.  please discuss these with your doctor.    Please continue the same metformin. Please consider the weight loss surgery, and let me know if you decide to go ahead.   Please let me know if you want to take "spironolactone" (we would need to adjust the lisinopril-HCTZ).   Please come back for a follow-up appointment in 6 months.  blood tests are requested for you today.  We'll let you know about the results.

## 2015-07-18 ENCOUNTER — Telehealth: Payer: Self-pay | Admitting: Physician Assistant

## 2015-07-18 MED ORDER — SUMATRIPTAN SUCCINATE 25 MG PO TABS: 25.0000 mg | ORAL_TABLET | Freq: Once | ORAL | Status: DC

## 2015-07-18 NOTE — Telephone Encounter (Signed)
Relation to YT:MMIT Call back number: [phone] work #  Pharmacy: [id number] - [place], [place] - [address] AT [address] [phone] (Phone) [phone] (Fax)         Reason for call:  Patient states as per PCP previous conversation if excedrin migraine doesn't work PCP would send in a RX. Patient states she has had a head ache of 2 days.  [phone]

## 2015-07-18 NOTE — Telephone Encounter (Signed)
Rx Imitrex sent to pharmacy for her to take as directed. Can use as long as no concern for pregnancy.

## 2015-07-18 NOTE — Telephone Encounter (Signed)
Patient informed, understood & agreed/SLS  

## 2015-09-09 ENCOUNTER — Other Ambulatory Visit: Payer: Self-pay | Admitting: Physician Assistant

## 2015-09-11 ENCOUNTER — Other Ambulatory Visit: Payer: Self-pay | Admitting: Physician Assistant

## 2015-09-18 ENCOUNTER — Emergency Department (HOSPITAL_BASED_OUTPATIENT_CLINIC_OR_DEPARTMENT_OTHER)
Admission: EM | Admit: 2015-09-18 | Discharge: 2015-09-18 | Disposition: A | Discharge status: Home / Self Care | Payer: BLUE CROSS/BLUE SHIELD | Attending: Emergency Medicine | Admitting: Emergency Medicine

## 2015-09-18 ENCOUNTER — Emergency Department (HOSPITAL_BASED_OUTPATIENT_CLINIC_OR_DEPARTMENT_OTHER): Payer: BLUE CROSS/BLUE SHIELD

## 2015-09-18 ENCOUNTER — Encounter (HOSPITAL_BASED_OUTPATIENT_CLINIC_OR_DEPARTMENT_OTHER): Payer: Self-pay | Admitting: Emergency Medicine

## 2015-09-18 DIAGNOSIS — I10 Essential (primary) hypertension: Secondary | ICD-10-CM | POA: Diagnosis not present

## 2015-09-18 DIAGNOSIS — Y9389 Activity, other specified: Secondary | ICD-10-CM | POA: Diagnosis not present

## 2015-09-18 DIAGNOSIS — Y998 Other external cause status: Secondary | ICD-10-CM | POA: Diagnosis not present

## 2015-09-18 DIAGNOSIS — Z79899 Other long term (current) drug therapy: Secondary | ICD-10-CM | POA: Insufficient documentation

## 2015-09-18 DIAGNOSIS — E119 Type 2 diabetes mellitus without complications: Secondary | ICD-10-CM | POA: Insufficient documentation

## 2015-09-18 DIAGNOSIS — K219 Gastro-esophageal reflux disease without esophagitis: Secondary | ICD-10-CM | POA: Diagnosis not present

## 2015-09-18 DIAGNOSIS — S134XXA Sprain of ligaments of cervical spine, initial encounter: Secondary | ICD-10-CM | POA: Diagnosis not present

## 2015-09-18 DIAGNOSIS — Y92410 Unspecified street and highway as the place of occurrence of the external cause: Secondary | ICD-10-CM | POA: Insufficient documentation

## 2015-09-18 DIAGNOSIS — Z7984 Long term (current) use of oral hypoglycemic drugs: Secondary | ICD-10-CM | POA: Insufficient documentation

## 2015-09-18 DIAGNOSIS — S139XXA Sprain of joints and ligaments of unspecified parts of neck, initial encounter: Secondary | ICD-10-CM

## 2015-09-18 DIAGNOSIS — S199XXA Unspecified injury of neck, initial encounter: Secondary | ICD-10-CM | POA: Diagnosis present

## 2015-09-18 MED ORDER — CYCLOBENZAPRINE HCL 10 MG PO TABS: 10.0000 mg | ORAL_TABLET | Freq: Three times a day (TID) | ORAL | Status: DC | PRN

## 2015-09-18 MED ORDER — IBUPROFEN 800 MG PO TABS: 800.0000 mg | ORAL_TABLET | Freq: Three times a day (TID) | ORAL | Status: DC | PRN
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
## 2015-09-18 NOTE — ED Notes (Signed)
MD at bedside. 
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
## 2015-09-25 ENCOUNTER — Other Ambulatory Visit: Payer: Self-pay | Admitting: Physician Assistant

## 2015-09-28 ENCOUNTER — Ambulatory Visit: Payer: BLUE CROSS/BLUE SHIELD | Admitting: Neurology

## 2015-09-30 ENCOUNTER — Encounter (HOSPITAL_BASED_OUTPATIENT_CLINIC_OR_DEPARTMENT_OTHER): Payer: Self-pay | Admitting: Emergency Medicine

## 2015-09-30 ENCOUNTER — Emergency Department (HOSPITAL_BASED_OUTPATIENT_CLINIC_OR_DEPARTMENT_OTHER): Payer: BLUE CROSS/BLUE SHIELD

## 2015-09-30 ENCOUNTER — Emergency Department (HOSPITAL_BASED_OUTPATIENT_CLINIC_OR_DEPARTMENT_OTHER)
Admission: EM | Admit: 2015-09-30 | Discharge: 2015-10-01 | Disposition: A | Discharge status: Home / Self Care | Payer: BLUE CROSS/BLUE SHIELD | Attending: Emergency Medicine | Admitting: Emergency Medicine

## 2015-09-30 DIAGNOSIS — S0990XA Unspecified injury of head, initial encounter: Secondary | ICD-10-CM | POA: Insufficient documentation

## 2015-09-30 DIAGNOSIS — Y998 Other external cause status: Secondary | ICD-10-CM | POA: Insufficient documentation

## 2015-09-30 DIAGNOSIS — Z79899 Other long term (current) drug therapy: Secondary | ICD-10-CM | POA: Diagnosis not present

## 2015-09-30 DIAGNOSIS — E119 Type 2 diabetes mellitus without complications: Secondary | ICD-10-CM | POA: Insufficient documentation

## 2015-09-30 DIAGNOSIS — K219 Gastro-esophageal reflux disease without esophagitis: Secondary | ICD-10-CM | POA: Insufficient documentation

## 2015-09-30 DIAGNOSIS — Z3202 Encounter for pregnancy test, result negative: Secondary | ICD-10-CM | POA: Insufficient documentation

## 2015-09-30 DIAGNOSIS — I10 Essential (primary) hypertension: Secondary | ICD-10-CM | POA: Insufficient documentation

## 2015-09-30 DIAGNOSIS — Y92410 Unspecified street and highway as the place of occurrence of the external cause: Secondary | ICD-10-CM | POA: Insufficient documentation

## 2015-09-30 DIAGNOSIS — Y9389 Activity, other specified: Secondary | ICD-10-CM | POA: Diagnosis not present

## 2015-09-30 DIAGNOSIS — Z791 Long term (current) use of non-steroidal anti-inflammatories (NSAID): Secondary | ICD-10-CM | POA: Diagnosis not present

## 2015-09-30 LAB — PREGNANCY, URINE: Preg Test, Ur: NEGATIVE

## 2015-09-30 MED ORDER — KETOROLAC TROMETHAMINE 60 MG/2ML IM SOLN
60.0000 mg | Freq: Once | INTRAMUSCULAR | Status: AC
  Administered 2015-09-30: 60 mg via INTRAMUSCULAR
  Filled 2015-09-30: qty 2

## 2015-09-30 MED ORDER — METHOCARBAMOL 500 MG PO TABS
1000.0000 mg | ORAL_TABLET | Freq: Once | ORAL | Status: AC
  Administered 2015-09-30: 1000 mg via ORAL
  Filled 2015-09-30: qty 2
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
## 2015-10-01 ENCOUNTER — Encounter (HOSPITAL_BASED_OUTPATIENT_CLINIC_OR_DEPARTMENT_OTHER): Payer: Self-pay | Admitting: Emergency Medicine

## 2015-10-01 MED ORDER — DICLOFENAC SODIUM ER 100 MG PO TB24: 100.0000 mg | ORAL_TABLET | Freq: Every day | ORAL | Status: DC

## 2015-10-01 MED ORDER — METHOCARBAMOL 500 MG PO TABS: 500.0000 mg | ORAL_TABLET | Freq: Two times a day (BID) | ORAL | Status: DC

## 2015-10-01 NOTE — Discharge Instructions (Signed)

## 2015-10-02 ENCOUNTER — Other Ambulatory Visit: Payer: Self-pay | Admitting: Physician Assistant

## 2015-10-02 MED ORDER — METFORMIN HCL 500 MG PO TABS: 500.0000 mg | ORAL_TABLET | Freq: Two times a day (BID) | ORAL | Status: DC

## 2015-10-21 ENCOUNTER — Telehealth: Payer: Self-pay | Admitting: *Deleted
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
## 2015-10-22 ENCOUNTER — Ambulatory Visit: Payer: BLUE CROSS/BLUE SHIELD | Admitting: Neurology

## 2015-11-05 ENCOUNTER — Other Ambulatory Visit: Payer: Self-pay | Admitting: Obstetrics and Gynecology

## 2015-11-22 ENCOUNTER — Telehealth: Payer: Self-pay | Admitting: Physician Assistant

## 2015-11-22 NOTE — Telephone Encounter (Signed)
lvm inquiring if patient received flu shot  °

## 2015-12-11 ENCOUNTER — Other Ambulatory Visit: Payer: Self-pay | Admitting: Obstetrics and Gynecology

## 2015-12-21 ENCOUNTER — Other Ambulatory Visit (HOSPITAL_COMMUNITY): Payer: Self-pay | Admitting: Obstetrics and Gynecology

## 2015-12-21 NOTE — H&P (Signed)
Angel Dawson is a 35 y.o.  female,  G:0 presents for hysteroscopy, dilation, curettage and endometrial ablation because of meno-metrorrhagia. For 2.5 years the patient has endured a 7-18 day menstrual flow with pad change every 1.5-3 hours.  When she passes clots however,  she has to change her pad more often.  Additionally she will bleed between periods for 3-5 days, changing her pad every 3 hours.  With all bleeding episodes the patient will have cramping that she rates 4/10 on a 10 point pain scale with some relief from Ibuprofen.  She denies any changes in bowel or bladder habits but has always had increased frequency and softening of stools when she has a period.  A sonohysterogram  in January 2017 revealed a uterus: anteverted-5.62 x 5.24 x 4.29 cm;  endometrium appeared arcuate, fibroid that is partially sub-mucosal measures: 2.6 x 2.3 x 2.3 cm; right ovary-normal and left ovary contained a simple cyst measuring 4.03 x 2.29 x 4.04 cm however on follow up ultrasound 12/2015 the cyst had resolved.  A TSH was normal and endometrial biopsy in January 2017 showed degenerative secretory type endometrium,  no evidence of hyperplasia or malignancy.  The patient was placed on Lo Loestrin Fe 2 months  and since that time has had less bleeding volume and length of flow but continues to have the irregularity.   Given the findings on ultrasound and response to the hormonal therapy the patient wants to proceed with endometrial ablation.   Past Medical History  OB History: G: 0  GYN History: menarche: 35 YO    LMP: 12/06/2015    Contracepton: Abstinence; Denies history of STDs;   Denies history of abnormal PAP smear;  Last PAP smear-2015, normal  Medical History: Bronchitis, Hypertension, Diabetes Mellitus,  Diabetic Ketoacidosis, GERD, Eczema, Lattice  Degeneration,  Bilateral Achilles Tendinitis, Mild Sleep Apnea (CPAP not prescribed)  and Migraines  Surgical History: 1999  Tonsillectomy and 2014  Cholecystectomy Denies problems with anesthesia or history of blood transfusions  Family History: Diabetes Mellitus, Hypertension, Thyroid Disease, Stroke, Lung Cancer and Asthma  Social History: 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  ROS: Admits to glasses/contact lenses;  Denies headache, vision changes, nasal congestion, dysphagia, tinnitus, dizziness, hoarseness, cough,  chest pain, shortness of breath, nausea, vomiting, diarrhea,constipation,  urinary frequency, urgency  dysuria, hematuria, vaginitis symptoms, pelvic pain, swelling of joints,easy bruising,  myalgias, arthralgias, skin rashes, unexplained weight loss and except as is mentioned in the history of present illness, patient's review of systems is otherwise negative.   ROS: Admits to tinnitus, headaches,  eczema, monthly flare of Achilles tendinitis but  denies vision changes, nasal congestion, dysphagia, dizziness, hoarseness, cough,  chest pain, shortness of breath, nausea, vomiting, diarrhea,constipation,  urinary frequency, urgency  dysuria, hematuria, vaginitis symptoms, pelvic pain, swelling of joints,easy bruising,  myalgias, unexplained weight loss and except as is mentioned in the history of present illness, patient's review of systems is otherwise negative.  Physical Exam  Bp: 122/76   P: 88   R: 20  Temperature: 99.5 degrees F orally  Weight: 420 lbs.  Height: 5\' 11"   BMI: 58.6  Neck: supple without masses or thyromegaly Lungs: clear to auscultation Heart: regular rate and rhythm Abdomen: soft, non-tender and no  organomegaly Pelvic:EGBUS- wnl; vagina-normal rugae; uterus-normal size, cervix without lesions or motion tenderness; adnexae-no tenderness or masses Extremities:  no clubbing, cyanosis or edema   Assesment: Menometrorrhagia            Submucosal Fibroid   Disposition:  Reviewed the risks of surgery to include, but not limited to: reaction to anesthesia, damage to adjacent organs, infection and excessive bleeding. The patient verbalized understanding of these risks and has consented to proceed with Hysteroscopy, Dilatation, Resection of Fibroid and Endometrial Ablation January 03, 2016 at 11:15 a.m.  CSN# [id number]   Shannell Mikkelsen J. Florene Glen, PA-C  for Dr. Franklyn Lor. Dillard

## 2015-12-31 ENCOUNTER — Telehealth: Payer: Self-pay | Admitting: *Deleted

## 2015-12-31 ENCOUNTER — Encounter (HOSPITAL_COMMUNITY)
Admission: RE | Admit: 2015-12-31 | Discharge: 2015-12-31 | Disposition: A | Discharge status: Home / Self Care | Payer: BLUE CROSS/BLUE SHIELD | Source: Ambulatory Visit | Attending: Obstetrics and Gynecology | Admitting: Obstetrics and Gynecology

## 2015-12-31 ENCOUNTER — Encounter (HOSPITAL_COMMUNITY): Payer: Self-pay

## 2015-12-31 DIAGNOSIS — N840 Polyp of corpus uteri: Secondary | ICD-10-CM | POA: Diagnosis not present

## 2015-12-31 DIAGNOSIS — I10 Essential (primary) hypertension: Secondary | ICD-10-CM | POA: Diagnosis not present

## 2015-12-31 DIAGNOSIS — K219 Gastro-esophageal reflux disease without esophagitis: Secondary | ICD-10-CM | POA: Diagnosis not present

## 2015-12-31 DIAGNOSIS — E119 Type 2 diabetes mellitus without complications: Secondary | ICD-10-CM | POA: Diagnosis not present

## 2015-12-31 DIAGNOSIS — Z6841 Body Mass Index (BMI) 40.0 and over, adult: Secondary | ICD-10-CM | POA: Diagnosis not present

## 2015-12-31 DIAGNOSIS — N920 Excessive and frequent menstruation with regular cycle: Secondary | ICD-10-CM | POA: Diagnosis present

## 2015-12-31 DIAGNOSIS — D250 Submucous leiomyoma of uterus: Secondary | ICD-10-CM | POA: Diagnosis not present

## 2015-12-31 HISTORY — DX: Polycystic ovarian syndrome: E28.2

## 2015-12-31 HISTORY — DX: Achilles tendinitis, right leg: M76.62

## 2015-12-31 HISTORY — DX: Lattice degeneration of retina, bilateral: H35.413

## 2015-12-31 HISTORY — DX: Achilles tendinitis, right leg: M76.61

## 2015-12-31 HISTORY — DX: Bronchitis, not specified as acute or chronic: J40

## 2015-12-31 LAB — BASIC METABOLIC PANEL
Anion gap: 7 (ref 5–15)
BUN: 14 mg/dL (ref 6–20)
CALCIUM: 9.6 mg/dL (ref 8.9–10.3)
CO2: 25 mmol/L (ref 22–32)
CREATININE: 0.85 mg/dL (ref 0.44–1.00)
Chloride: 105 mmol/L (ref 101–111)
GFR calc non Af Amer: >60 mL/min (ref >60)
Glucose, Bld: 143 mg/dL — ABNORMAL HIGH (ref 65–99)
Potassium: 4.1 mmol/L (ref 3.5–5.1)
SODIUM: 137 mmol/L (ref 135–145)

## 2015-12-31 LAB — CBC
HCT: 35.1 % — ABNORMAL LOW (ref 36.0–46.0)
Hemoglobin: 11.2 g/dL — ABNORMAL LOW (ref 12.0–15.0)
MCH: 25.3 pg — AB (ref 26.0–34.0)
MCHC: 31.9 g/dL (ref 30.0–36.0)
MCV: 79.4 fL (ref 78.0–100.0)
PLATELETS: 344 10*3/uL (ref 150–400)
RBC: 4.42 MIL/uL (ref 3.87–5.11)
RDW: 14.7 % (ref 11.5–15.5)
WBC: 10.9 10*3/uL — ABNORMAL HIGH (ref 4.0–10.5)

## 2015-12-31 NOTE — Patient Instructions (Signed)
Your procedure is scheduled on:  Thursday, January 03, 2016  Enter through the Main Entrance of [facility] at:  9:45 AM  Pick up the phone at the desk and dial [phone].  Call this number if you have problems the morning of surgery: [phone].  Remember:  Do NOT eat food or drink after:  Midnight Wednesday  Take these medicines the morning of surgery with a SIP OF WATER: Carvedilol, Lisinopril, Omeprazole  Do NOT take evening dose of Metformin day before surgery.  Bring Asthma inhaler day of surgery.  Do NOT wear jewelry (body piercing), metal hair clips/bobby pins, make-up, or nail polish. Do NOT wear lotions, powders, or perfumes.  You may wear deodorant. Do NOT shave for 48 hours prior to surgery. Do NOT bring valuables to the hospital. Contacts, dentures, or bridgework may not be worn into surgery.  Have a responsible adult drive you home and stay with you for 24 hours after your procedure

## 2015-12-31 NOTE — Telephone Encounter (Deleted)
Received physician orders; forwarded to provider/SLS 03/20

## 2016-01-01 ENCOUNTER — Telehealth: Payer: Self-pay | Admitting: Physician Assistant

## 2016-01-01 MED ORDER — CARVEDILOL 12.5 MG PO TABS: 12.5000 mg | ORAL_TABLET | Freq: Two times a day (BID) | ORAL | Status: DC

## 2016-01-01 NOTE — Telephone Encounter (Signed)
29-month supply granted. No further refills until patient is seen.

## 2016-01-01 NOTE — Telephone Encounter (Signed)
Pharmacy: [id number] - [place], [place] - [address] AT [place]  Reason for call: pt called for refill on carvedilol. She has 4 left and takes 2/day. Please send to local pharmacy. Pt is scheduled for f/u appt 01/23/16

## 2016-01-01 NOTE — Telephone Encounter (Signed)
Entered in Error/SLS 03/21

## 2016-01-03 ENCOUNTER — Ambulatory Visit (HOSPITAL_COMMUNITY)
Admission: RE | Admit: 2016-01-03 | Discharge: 2016-01-03 | Disposition: A | Discharge status: Home / Self Care | Payer: BLUE CROSS/BLUE SHIELD | Source: Ambulatory Visit | Attending: Obstetrics and Gynecology | Admitting: Obstetrics and Gynecology

## 2016-01-03 ENCOUNTER — Encounter (HOSPITAL_COMMUNITY)
Admission: RE | Disposition: A | Discharge status: Home / Self Care | Payer: Self-pay | Source: Ambulatory Visit | Attending: Obstetrics and Gynecology

## 2016-01-03 ENCOUNTER — Ambulatory Visit (HOSPITAL_COMMUNITY): Payer: BLUE CROSS/BLUE SHIELD | Admitting: Anesthesiology

## 2016-01-03 ENCOUNTER — Encounter (HOSPITAL_COMMUNITY): Payer: Self-pay | Admitting: Anesthesiology

## 2016-01-03 DIAGNOSIS — I10 Essential (primary) hypertension: Secondary | ICD-10-CM | POA: Insufficient documentation

## 2016-01-03 DIAGNOSIS — Z6841 Body Mass Index (BMI) 40.0 and over, adult: Secondary | ICD-10-CM | POA: Insufficient documentation

## 2016-01-03 DIAGNOSIS — E119 Type 2 diabetes mellitus without complications: Secondary | ICD-10-CM | POA: Insufficient documentation

## 2016-01-03 DIAGNOSIS — N840 Polyp of corpus uteri: Secondary | ICD-10-CM | POA: Insufficient documentation

## 2016-01-03 DIAGNOSIS — N920 Excessive and frequent menstruation with regular cycle: Secondary | ICD-10-CM | POA: Diagnosis not present

## 2016-01-03 DIAGNOSIS — K219 Gastro-esophageal reflux disease without esophagitis: Secondary | ICD-10-CM | POA: Insufficient documentation

## 2016-01-03 DIAGNOSIS — D250 Submucous leiomyoma of uterus: Secondary | ICD-10-CM | POA: Insufficient documentation

## 2016-01-03 HISTORY — PX: DILITATION & CURRETTAGE/HYSTROSCOPY WITH HYDROTHERMAL ABLATION: SHX5570

## 2016-01-03 LAB — PREGNANCY, URINE: PREG TEST UR: NEGATIVE

## 2016-01-03 LAB — GLUCOSE, CAPILLARY: GLUCOSE-CAPILLARY: 168 mg/dL — AB (ref 65–99)

## 2016-01-03 SURGERY — DILATATION & CURETTAGE/HYSTEROSCOPY WITH HYDROTHERMAL ABLATION
Anesthesia: General | Site: Uterus

## 2016-01-03 MED ORDER — MEPERIDINE HCL 25 MG/ML IJ SOLN: 6.2500 mg | INTRAMUSCULAR | Status: DC | PRN

## 2016-01-03 MED ORDER — PHENYLEPHRINE HCL 10 MG/ML IJ SOLN
INTRAMUSCULAR | Status: DC | PRN
  Administered 2016-01-03: 40 ug via INTRAVENOUS
  Administered 2016-01-03 (×2): 80 ug via INTRAVENOUS
  Administered 2016-01-03: 40 ug via INTRAVENOUS
  Administered 2016-01-03 (×2): 80 ug via INTRAVENOUS

## 2016-01-03 MED ORDER — HYDROCODONE-ACETAMINOPHEN 5-325 MG PO TABS: 1.0000 | ORAL_TABLET | Freq: Four times a day (QID) | ORAL | Status: DC | PRN

## 2016-01-03 MED ORDER — GLYCOPYRROLATE 0.2 MG/ML IJ SOLN
INTRAMUSCULAR | Status: DC | PRN
  Administered 2016-01-03: 0.1 mg via INTRAVENOUS

## 2016-01-03 MED ORDER — PHENYLEPHRINE 40 MCG/ML (10ML) SYRINGE FOR IV PUSH (FOR BLOOD PRESSURE SUPPORT)
PREFILLED_SYRINGE | INTRAVENOUS | Status: AC
  Filled 2016-01-03: qty 10

## 2016-01-03 MED ORDER — SODIUM CHLORIDE 0.9 % IR SOLN
Status: DC | PRN
  Administered 2016-01-03: 3000 mL

## 2016-01-03 MED ORDER — KETOROLAC TROMETHAMINE 30 MG/ML IJ SOLN
INTRAMUSCULAR | Status: AC
  Filled 2016-01-03: qty 1

## 2016-01-03 MED ORDER — CITRIC ACID-SODIUM CITRATE 334-500 MG/5ML PO SOLN
ORAL | Status: AC
  Filled 2016-01-03: qty 15

## 2016-01-03 MED ORDER — KETOROLAC TROMETHAMINE 30 MG/ML IJ SOLN
INTRAMUSCULAR | Status: DC | PRN
  Administered 2016-01-03: 30 mg via INTRAVENOUS

## 2016-01-03 MED ORDER — IBUPROFEN 100 MG/5ML PO SUSP
200.0000 mg | Freq: Four times a day (QID) | ORAL | Status: DC | PRN
  Filled 2016-01-03: qty 20

## 2016-01-03 MED ORDER — PHENYLEPHRINE 8 MG IN D5W 100 ML (0.08MG/ML) PREMIX OPTIME
INJECTION | INTRAVENOUS | Status: DC | PRN
  Administered 2016-01-03: 60 ug/min via INTRAVENOUS

## 2016-01-03 MED ORDER — PROPOFOL 10 MG/ML IV BOLUS
INTRAVENOUS | Status: AC
  Filled 2016-01-03: qty 20

## 2016-01-03 MED ORDER — DEXAMETHASONE SODIUM PHOSPHATE 10 MG/ML IJ SOLN
INTRAMUSCULAR | Status: AC
  Filled 2016-01-03: qty 1

## 2016-01-03 MED ORDER — EPHEDRINE SULFATE 50 MG/ML IJ SOLN
INTRAMUSCULAR | Status: DC | PRN
  Administered 2016-01-03: 10 mg via INTRAVENOUS

## 2016-01-03 MED ORDER — LIDOCAINE HCL (CARDIAC) 20 MG/ML IV SOLN
INTRAVENOUS | Status: AC
  Filled 2016-01-03: qty 5

## 2016-01-03 MED ORDER — LACTATED RINGERS IV SOLN
INTRAVENOUS | Status: DC
  Administered 2016-01-03: 10:00:00 via INTRAVENOUS

## 2016-01-03 MED ORDER — ONDANSETRON HCL 4 MG/2ML IJ SOLN: 4.0000 mg | Freq: Once | INTRAMUSCULAR | Status: DC | PRN

## 2016-01-03 MED ORDER — EPHEDRINE 5 MG/ML INJ
INTRAVENOUS | Status: AC
  Filled 2016-01-03: qty 10

## 2016-01-03 MED ORDER — LIDOCAINE HCL 2 % IJ SOLN
INTRAMUSCULAR | Status: DC | PRN
  Administered 2016-01-03: 10 mL

## 2016-01-03 MED ORDER — FENTANYL CITRATE (PF) 100 MCG/2ML IJ SOLN: 25.0000 ug | INTRAMUSCULAR | Status: DC | PRN

## 2016-01-03 MED ORDER — GLYCOPYRROLATE 0.2 MG/ML IJ SOLN
INTRAMUSCULAR | Status: AC
  Filled 2016-01-03: qty 1

## 2016-01-03 MED ORDER — ONDANSETRON HCL 4 MG/2ML IJ SOLN
INTRAMUSCULAR | Status: DC | PRN
Start: 2016-01-03 — End: 2016-01-03
  Administered 2016-01-03: 4 mg via INTRAVENOUS

## 2016-01-03 MED ORDER — HYDROCODONE-ACETAMINOPHEN 7.5-325 MG PO TABS
ORAL_TABLET | ORAL | Status: AC
  Filled 2016-01-03: qty 1

## 2016-01-03 MED ORDER — LIDOCAINE HCL 2 % IJ SOLN
INTRAMUSCULAR | Status: AC
  Filled 2016-01-03: qty 20

## 2016-01-03 MED ORDER — FENTANYL CITRATE (PF) 100 MCG/2ML IJ SOLN
INTRAMUSCULAR | Status: DC | PRN
  Administered 2016-01-03: 50 ug via INTRAVENOUS
  Administered 2016-01-03: 25 ug via INTRAVENOUS
  Administered 2016-01-03 (×2): 50 ug via INTRAVENOUS
  Administered 2016-01-03: 25 ug via INTRAVENOUS

## 2016-01-03 MED ORDER — LIDOCAINE HCL (CARDIAC) 20 MG/ML IV SOLN
INTRAVENOUS | Status: DC | PRN
  Administered 2016-01-03: 100 mg via INTRAVENOUS

## 2016-01-03 MED ORDER — KETOROLAC TROMETHAMINE 30 MG/ML IJ SOLN: 30.0000 mg | Freq: Once | INTRAMUSCULAR | Status: DC

## 2016-01-03 MED ORDER — IBUPROFEN 200 MG PO TABS
200.0000 mg | ORAL_TABLET | Freq: Four times a day (QID) | ORAL | Status: DC | PRN
  Filled 2016-01-03: qty 2

## 2016-01-03 MED ORDER — PHENYLEPHRINE 8 MG IN D5W 100 ML (0.08MG/ML) PREMIX OPTIME
INJECTION | INTRAVENOUS | Status: AC
  Filled 2016-01-03: qty 100

## 2016-01-03 MED ORDER — SCOPOLAMINE 1 MG/3DAYS TD PT72
1.0000 | MEDICATED_PATCH | Freq: Once | TRANSDERMAL | Status: DC
  Administered 2016-01-03: 1.5 mg via TRANSDERMAL

## 2016-01-03 MED ORDER — HYDROCODONE-ACETAMINOPHEN 7.5-325 MG PO TABS
1.0000 | ORAL_TABLET | Freq: Once | ORAL | Status: AC | PRN
Start: 2016-01-03 — End: 2016-01-03
  Administered 2016-01-03: 1 via ORAL

## 2016-01-03 MED ORDER — MIDAZOLAM HCL 2 MG/2ML IJ SOLN
INTRAMUSCULAR | Status: AC
  Filled 2016-01-03: qty 2

## 2016-01-03 MED ORDER — SCOPOLAMINE 1 MG/3DAYS TD PT72
MEDICATED_PATCH | TRANSDERMAL | Status: AC
  Filled 2016-01-03: qty 1

## 2016-01-03 MED ORDER — FENTANYL CITRATE (PF) 250 MCG/5ML IJ SOLN
INTRAMUSCULAR | Status: AC
  Filled 2016-01-03: qty 5

## 2016-01-03 MED ORDER — MIDAZOLAM HCL 2 MG/2ML IJ SOLN
INTRAMUSCULAR | Status: DC | PRN
  Administered 2016-01-03 (×2): 1 mg via INTRAVENOUS

## 2016-01-03 MED ORDER — SILVER NITRATE-POT NITRATE 75-25 % EX MISC
CUTANEOUS | Status: AC
  Filled 2016-01-03: qty 1

## 2016-01-03 MED ORDER — ONDANSETRON HCL 4 MG/2ML IJ SOLN
INTRAMUSCULAR | Status: AC
  Filled 2016-01-03: qty 2

## 2016-01-03 MED ORDER — PROPOFOL 10 MG/ML IV BOLUS
INTRAVENOUS | Status: DC | PRN
  Administered 2016-01-03: 40 mg via INTRAVENOUS
  Administered 2016-01-03: 250 mg via INTRAVENOUS

## 2016-01-03 SURGICAL SUPPLY — 14 items
CANISTER SUCT 3000ML (MISCELLANEOUS) ×2 IMPLANT
CATH ROBINSON RED A/P 16FR (CATHETERS) ×2 IMPLANT
CLOTH BEACON ORANGE TIMEOUT ST (SAFETY) ×2 IMPLANT
CONTAINER PREFILL 10% NBF 60ML (FORM) ×4 IMPLANT
DILATOR CANAL MILEX (MISCELLANEOUS) IMPLANT
GLOVE BIO SURGEON STRL SZ 6.5 (GLOVE) ×2 IMPLANT
GLOVE BIOGEL PI IND STRL 7.0 (GLOVE) ×2 IMPLANT
GLOVE BIOGEL PI INDICATOR 7.0 (GLOVE) ×2
GOWN STRL REUS W/TWL LRG LVL3 (GOWN DISPOSABLE) ×4 IMPLANT
PACK VAGINAL MINOR WOMEN LF (CUSTOM PROCEDURE TRAY) ×2 IMPLANT
PAD OB MATERNITY 4.3X12.25 (PERSONAL CARE ITEMS) ×2 IMPLANT
SET GENESYS HTA PROCERVA (MISCELLANEOUS) ×2 IMPLANT
TOWEL OR 17X24 6PK STRL BLUE (TOWEL DISPOSABLE) ×4 IMPLANT
WATER STERILE IRR 1000ML POUR (IV SOLUTION) ×2 IMPLANT

## 2016-01-03 NOTE — Transfer of Care (Signed)
Immediate Anesthesia Transfer of Care Note  Patient: Chistine Basil  Procedure(s) Performed: Procedure(s) with comments: DILATATION & CURETTAGE/HYSTEROSCOPY WITH HYDROTHERMAL ABLATION (N/A) - [facility] rep will be attend confirmed by office 12/12/15   Patient Location: PACU  Anesthesia Type:General  Level of Consciousness: awake, alert , oriented and patient cooperative  Airway & Oxygen Therapy: Patient Spontanous Breathing and Patient connected to face mask oxygen  Post-op Assessment: Report given to RN and Post -op Vital signs reviewed and stable  Post vital signs: Reviewed and stable  Last Vitals:  Filed Vitals:   01/03/16 0947  BP: 157/97  Pulse: 100  Temp: 36.8 C  Resp: 20    Complications: No apparent anesthesia complications

## 2016-01-03 NOTE — Discharge Instructions (Signed)
Please do NOT take Motrin until after 6pm today.   Hysteroscopy, Care After Refer to this sheet in the next few weeks. These instructions provide you with information on caring for yourself after your procedure. Your health care provider may also give you more specific instructions. Your treatment has been planned according to current medical practices, but problems sometimes occur. Call your health care provider if you have any problems or questions after your procedure.  WHAT TO EXPECT AFTER THE PROCEDURE After your procedure, it is typical to have the following:  You may have some cramping. This normally lasts for a couple days.  You may have bleeding. This can vary from light spotting for a few days to menstrual-like bleeding for 3-7 days. HOME CARE INSTRUCTIONS  Rest for the first 1-2 days after the procedure.  Only take over-the-counter or prescription medicines as directed by your health care provider. Do not take aspirin. It can increase the chances of bleeding.  Take showers instead of baths for 2 weeks or as directed by your health care provider.  Do not drive for 24 hours or as directed.  Do not drink alcohol while taking pain medicine.  Do not use tampons, douche, or have sexual intercourse for 2 weeks or until your health care provider says it is okay.  Take your temperature twice a day for 4-5 days. Write it down each time.  Follow your health care provider's advice about diet, exercise, and lifting.  If you develop constipation, you may:  Take a mild laxative if your health care provider approves.  Add bran foods to your diet.  Drink enough fluids to keep your urine clear or pale yellow.  Try to have someone with you or available to you for the first 24-48 hours, especially if you were given a general anesthetic.  Follow up with your health care provider as directed. SEEK MEDICAL CARE IF:  You feel dizzy or lightheaded.  You feel sick to your stomach  (nauseous).  You have abnormal vaginal discharge.  You have a rash.  You have pain that is not controlled with medicine. SEEK IMMEDIATE MEDICAL CARE IF:  You have bleeding that is heavier than a normal menstrual period.  You have a fever.  You have increasing cramps or pain, not controlled with medicine.  You have new belly (abdominal) pain.  You pass out.  You have pain in the tops of your shoulders (shoulder strap areas).  You have shortness of breath.   This information is not intended to replace advice given to you by your health care provider. Make sure you discuss any questions you have with your health care provider.   Document Released: 07/20/2013 Document Reviewed: 07/20/2013 Elsevier Interactive Patient Education 2016 Elsevier Inc. Dilation and Curettage or Vacuum Curettage, Care After Refer to this sheet in the next few weeks. These instructions provide you with information on caring for yourself after your procedure. Your health care provider may also give you more specific instructions. Your treatment has been planned according to current medical practices, but problems sometimes occur. Call your health care provider if you have any problems or questions after your procedure. WHAT TO EXPECT AFTER THE PROCEDURE After your procedure, it is typical to have light cramping and bleeding. This may last for 2 days to 2 weeks after the procedure. HOME CARE INSTRUCTIONS   Do not drive for 24 hours.  Wait 1 week before returning to strenuous activities.  Take your temperature 2 times a day for  4 days and write it down. Provide these temperatures to your health care provider if you develop a fever.  Avoid long periods of standing.  Avoid heavy lifting, pushing, or pulling. Do not lift anything heavier than 10 pounds (4.5 kg).  Limit stair climbing to once or twice a day.  Take rest periods often.  You may resume your usual diet.  Drink enough fluids to keep your  urine clear or pale yellow.  Your usual bowel function should return. If you have constipation, you may:  Take a mild laxative with permission from your health care provider.  Add fruit and bran to your diet.  Drink more fluids.  Take showers instead of baths until your health care provider gives you permission to take baths.  Do not go swimming or use a hot tub until your health care provider approves.  Try to have someone with you or available to you the first 24-48 hours, especially if you were given a general anesthetic.  Do not douche, use tampons, or have sex (intercourse) for 2 weeks after the procedure.  Only take over-the-counter or prescription medicines as directed by your health care provider. Do not take aspirin. It can cause bleeding.  Follow up with your health care provider as directed. SEEK MEDICAL CARE IF:   You have increasing cramps or pain that is not relieved with medicine.  You have abdominal pain that does not seem to be related to the same area of earlier cramping and pain.  You have bad smelling vaginal discharge.  You have a rash.  You are having problems with any medicine. SEEK IMMEDIATE MEDICAL CARE IF:   You have bleeding that is heavier than a normal menstrual period.  You have a fever.  You have chest pain.  You have shortness of breath.  You feel dizzy or feel like fainting.  You pass out.  You have pain in your shoulder strap area.  You have heavy vaginal bleeding with or without blood clots. MAKE SURE YOU:   Understand these instructions.  Will watch your condition.  Will get help right away if you are not doing well or get worse.   This information is not intended to replace advice given to you by your health care provider. Make sure you discuss any questions you have with your health care provider.   Document Released: 09/26/2000 Document Revised: 10/04/2013 Document Reviewed: 04/28/2013 Elsevier Interactive Patient  Education [employer].

## 2016-01-03 NOTE — Op Note (Signed)
Preop Diagnosis: Menorrhagia, Endometrial Mass possible Fibroid   Postop Diagnosis: Menorrhagia, Endometrial Mass possible Fibroid   Procedure: DILATATION & CURETTAGE/HYSTEROSCOPY WITH HYDROTHERMAL ABLATION   Anesthesia: General   Anesthesiologist: Lyn Hollingshead, MD   Attending: Crawford Givens, MD   Assistant: none  Findings: two polyps and fundal submucosal fibroid  Pathology: EMC, bx of two polys  Fluids:  UOP: about 200cc  EBL: minimal  Complications: none  Procedure: The patient was taken to the operating room after risks benefits and alternatives were discussed with patient, the patient verbalized understanding and consent signed and witnessed. The patient was placed under general anesthesia and prepped and draped in normal sterile fashion. A bivalve speculum was placed in the patient's vagina and the anterior lip of the cervix was grasped with a single-tooth tenaculum. The cervix was dilated for passage of the hysteroscope. The uterus sounded to 7.  The hysteroscope was introduced into the uterine cavity with findings as noted above. A curettage was performed and currettings sent to pathology.  Two polyps were seen and biopsied.  i could not grasp them with the polyp forceps.  The hysteroscope was reintroduced and hydrothermal ablation was performed without difficulty. The tenaculum and bivalve speculum were removed and there was good hemostasis at the tenaculum sites. Sponge lap and needle count was correct. The patient tolerated procedure well and was returned to the recovery room in good condition.

## 2016-01-03 NOTE — Anesthesia Preprocedure Evaluation (Signed)
Anesthesia Evaluation  Patient identified by MRN, date of birth, ID band Patient awake    Reviewed: Allergy & Precautions, H&P , Patient's Chart, lab work & pertinent test results, reviewed documented beta blocker date and time   Airway Mallampati: II  TM Distance: >3 FB Neck ROM: full    Dental  (+) Poor Dentition   Pulmonary neg pulmonary ROS,    Pulmonary exam normal        Cardiovascular hypertension, Pt. on medications and Pt. on home beta blockers Normal cardiovascular exam     Neuro/Psych negative psych ROS   GI/Hepatic Neg liver ROS, GERD  Medicated and Controlled,  Endo/Other  diabetes, Well Controlled, Type 2Morbid obesity  Renal/GU negative Renal ROS     Musculoskeletal   Abdominal (+) + obese,   Peds  Hematology negative hematology ROS (+)   Anesthesia Other Findings   Reproductive/Obstetrics negative OB ROS                             Anesthesia Physical Anesthesia Plan  ASA: III  Anesthesia Plan: General   Post-op Pain Management:    Induction: Intravenous  Airway Management Planned: LMA  Additional Equipment:   Intra-op Plan:   Post-operative Plan:   Informed Consent: I have reviewed the patients History and Physical, chart, labs and discussed the procedure including the risks, benefits and alternatives for the proposed anesthesia with the patient or authorized representative who has indicated his/her understanding and acceptance.     Plan Discussed with: CRNA and Surgeon  Anesthesia Plan Comments:         Anesthesia Quick Evaluation

## 2016-01-03 NOTE — Anesthesia Procedure Notes (Addendum)
Procedure Name: LMA Insertion Date/Time: 01/03/2016 11:54 AM Performed by: Georgeanne Nim Pre-anesthesia Checklist: Patient identified, Patient being monitored, Timeout performed, Emergency Drugs available and Suction available Patient Re-evaluated:Patient Re-evaluated prior to inductionOxygen Delivery Method: Circle system utilized Preoxygenation: Pre-oxygenation with 100% oxygen Intubation Type: IV induction Ventilation: Mask ventilation without difficulty LMA: LMA inserted LMA Size: 4.0 Number of attempts: 1 Airway Equipment and Method: Patient positioned with wedge pillow Placement Confirmation: positive ETCO2,  CO2 detector and breath sounds checked- equal and bilateral Tube secured with: Tape Dental Injury: Teeth and Oropharynx as per pre-operative assessment    Date/Time: 01/03/2016 11:54 AM Performed by: Everette Rank P LMA: LMA with gastric port inserted LMA Size: 4.0

## 2016-01-04 LAB — GLUCOSE, CAPILLARY: GLUCOSE-CAPILLARY: 121 mg/dL — AB (ref 65–99)

## 2016-01-04 NOTE — Anesthesia Postprocedure Evaluation (Signed)
Anesthesia Post Note  Patient: Angel Dawson  Procedure(s) Performed: Procedure(s) (LRB): DILATATION & CURETTAGE/HYSTEROSCOPY WITH HYDROTHERMAL ABLATION (N/A)  Patient location during evaluation: PACU Anesthesia Type: General Level of consciousness: awake Pain management: pain level controlled Vital Signs Assessment: post-procedure vital signs reviewed and stable Respiratory status: spontaneous breathing Cardiovascular status: stable Postop Assessment: no signs of nausea or vomiting Anesthetic complications: no    Last Vitals:  Filed Vitals:   01/03/16 1330 01/03/16 1430  BP: 141/76 142/84  Pulse: 83 76  Temp:  36.8 C  Resp: 20 16    Last Pain:  Filed Vitals:   01/03/16 1435  PainSc: [place]

## 2016-01-05 ENCOUNTER — Encounter (HOSPITAL_COMMUNITY): Payer: Self-pay | Admitting: Obstetrics and Gynecology

## 2016-01-16 DIAGNOSIS — Z09 Encounter for follow-up examination after completed treatment for conditions other than malignant neoplasm: Secondary | ICD-10-CM | POA: Diagnosis not present

## 2016-01-23 ENCOUNTER — Ambulatory Visit (INDEPENDENT_AMBULATORY_CARE_PROVIDER_SITE_OTHER): Payer: BLUE CROSS/BLUE SHIELD | Admitting: Physician Assistant

## 2016-01-23 ENCOUNTER — Encounter: Payer: Self-pay | Admitting: Physician Assistant

## 2016-01-23 VITALS — BP 144/86 | HR 90 | Temp 98.7°F | Ht 71.0 in | Wt >= 6400 oz

## 2016-01-23 DIAGNOSIS — E1165 Type 2 diabetes mellitus with hyperglycemia: Secondary | ICD-10-CM

## 2016-01-23 DIAGNOSIS — Z23 Encounter for immunization: Secondary | ICD-10-CM | POA: Diagnosis not present

## 2016-01-23 DIAGNOSIS — I10 Essential (primary) hypertension: Secondary | ICD-10-CM | POA: Diagnosis not present

## 2016-01-23 DIAGNOSIS — IMO0001 Reserved for inherently not codable concepts without codable children: Secondary | ICD-10-CM

## 2016-01-23 LAB — BASIC METABOLIC PANEL
BUN: 11 mg/dL (ref 6–23)
CO2: 25 mEq/L (ref 19–32)
CREATININE: 0.85 mg/dL (ref 0.40–1.20)
Calcium: 10 mg/dL (ref 8.4–10.5)
Chloride: 106 mEq/L (ref 96–112)
GFR: 96.99 mL/min (ref >60.00)
GLUCOSE: 196 mg/dL — AB (ref 70–99)
Potassium: 4.2 mEq/L (ref 3.5–5.1)
Sodium: 139 mEq/L (ref 135–145)

## 2016-01-23 LAB — HEMOGLOBIN A1C: HEMOGLOBIN A1C: 7.5 % — AB (ref 4.6–6.5)

## 2016-01-23 MED ORDER — LISINOPRIL-HYDROCHLOROTHIAZIDE 20-25 MG PO TABS: 1.0000 | ORAL_TABLET | Freq: Every day | ORAL | Status: DC

## 2016-01-23 MED ORDER — METFORMIN HCL 500 MG PO TABS: 500.0000 mg | ORAL_TABLET | Freq: Every day | ORAL | Status: DC

## 2016-01-23 NOTE — Assessment & Plan Note (Signed)
Has not taken BP medication this morning.  Discussed that it is hard for me to assess the efficacy of her BP medication if she does not take it before visits. Will check BMP. DASH diet encouraged. Continue current regimen but she is to follow-up with RN in 2 weeks for a BP check, having taken her medications so we can reassess.

## 2016-01-23 NOTE — Assessment & Plan Note (Addendum)
Foot exam up-to-date. She is scheduling a follow-up eye exam with [facility] and is to have them send [place] a copy of results. Pneumovax given today.  No history of neuropathy, nephropathy or retinopathy. Will check BMP and A1C today. Will alter regimen based on results.

## 2016-01-23 NOTE — Assessment & Plan Note (Signed)
Body mass index is 59.64 kg/(m^2). Discussed need to resume diet and exercise regimen. Recommended nutrition and bariatric consult. Patient will give some thought.

## 2016-01-23 NOTE — Progress Notes (Signed)
Patient presents to clinic today for follow-up of hypertension and diabetes mellitus II, previously well controlled.   Hypertension -- Patient currently on lisinopril-HCTZ 20-25 mg. Is taking daily as directed. Patient denies chest pain, palpitations, lightheadedness, dizziness, vision changes or frequent headaches. Has not taken medications this morning since she has not eaten.   BP Readings from Last 3 Encounters:  01/23/16 144/86  01/03/16 142/84  12/31/15 141/106   Diabetes Mellitus -- Last A1C 03/2015 at 6.0. Is currently on Metformin 500 mg once daily. Is taking as directed. Has been checking sugars infrequently -- endorses fasting sugars averaging 130s. Has been making poor food choices. Body mass index is 59.64 kg/(m^2). Has been going to the gym twice per week over the past few months.   -- Foot Exam -- up-to-date  -- Diabetic Eye Exam -- Followed by [facility]. Last exam 11/2014. Due for repeat. Patient is scheduling an appointment.   -- Pneumonia Vaccination -- due for Pneumovax. Agrees to have today.   Past Medical History  Diagnosis Date  . Hypertension   . GERD (gastroesophageal reflux disease)   . Abdominal pain     related to gallstones  . Gallstones   . Sinus congestion     occ. uses OTC meds for this  . Pancreatitis   . Headache   . Bronchitis     02/17  . Lattice degeneration, both eyes   . Achilles tendonitis, bilateral   . PCOS (polycystic ovarian syndrome)   . DKA (diabetic ketoacidoses) ([place]) 08/25/2014  . Diabetes [facility])     Current Outpatient Prescriptions on File Prior to Visit  Medication Sig Dispense Refill  . albuterol (PROVENTIL HFA;VENTOLIN HFA) 108 (90 Base) MCG/ACT inhaler Inhale 2 puffs into the lungs every 6 (six) hours as needed for wheezing or shortness of breath.    . Blood Glucose Monitoring Suppl (BLOOD GLUCOSE METER KIT AND SUPPLIES) Dispense based on patient and insurance preference. Use up to four times daily as directed. (FOR  ICD-9 250.00, 250.01). 1 each 0  . carvedilol (COREG) 12.5 MG tablet Take 1 tablet (12.5 mg total) by mouth 2 (two) times daily with a meal. 60 tablet 0  . glucose blood (ONE TOUCH ULTRA TEST) test strip Check once daily. Dx:E11.9 100 each 0  . ibuprofen (ADVIL,MOTRIN) 200 MG tablet Take 200-300 mg by mouth every 6 (six) hours as needed for headache, mild pain or moderate pain.    Marland Kitchen lisinopril-hydrochlorothiazide (PRINZIDE,ZESTORETIC) 20-25 MG tablet TAKE 1 TABLET DAILY 90 tablet 1  . metFORMIN (GLUCOPHAGE) 500 MG tablet Take 1 tablet (500 mg total) by mouth 2 (two) times daily with a meal. (Patient taking differently: Take 500 mg by mouth daily with breakfast. ) 60 tablet 0  . NON FORMULARY Nettipot Nasal    . omeprazole (PRILOSEC) 20 MG capsule TAKE 1 CAPSULE DAILY 90 capsule 3  . ONETOUCH DELICA LANCETS 19E MISC Use once daily to check glucose. 100 each 1   No current facility-administered medications on file prior to visit.    No Known Allergies  Family History  Problem Relation Age of Onset  . Hypertension Mother   . Diabetes Mother   . Hypertension Father   . Diabetes Father   . Stroke Paternal Grandfather   . Lung cancer Father   . Asthma      Social History   Social History  . Marital Status: Single    Spouse Name: N/A  . Number of Children: 0  .  Years of Education: N/A   Occupational History  . research/scientist    Social History Main Topics  . Smoking status: Never Smoker   . Smokeless tobacco: Never Used  . Alcohol Use: 0.0 oz/week    0 Standard drinks or equivalent per week     Comment: 1 drink per month  . Drug Use: No  . Sexual Activity: No   Other Topics Concern  . None   Social History Narrative   Review of Systems - See HPI.  All other ROS are negative.  BP 144/86 mmHg  Pulse 90  Temp(Src) 98.7 F (37.1 C) (Oral)  Ht 5' 11" (1.803 m)  Wt 427 lb 6.4 oz (193.867 kg)  BMI 59.64 kg/m2  SpO2 98%  LMP 12/08/2015 (Approximate)  Physical Exam    Constitutional: She is oriented to person, place, and time and well-developed, well-nourished, and in no distress.  HENT:  Head: Normocephalic and atraumatic.  Eyes: Conjunctivae are normal.  Neck: Neck supple.  Cardiovascular: Normal rate, regular rhythm, normal heart sounds and intact distal pulses.   Pulmonary/Chest: Effort normal. No respiratory distress. She has no wheezes. She has no rales. She exhibits no tenderness.  Neurological: She is alert and oriented to person, place, and time.  Skin: Skin is warm and dry. No rash noted.  Psychiatric: Affect normal.  Vitals reviewed.   Recent Results (from the past 2160 hour(s))  CBC     Status: Abnormal   Collection Time: 12/31/15  4:05 PM  Result Value Ref Range   WBC 10.9 (H) 4.0 - 10.5 K/uL   RBC 4.42 3.87 - 5.11 MIL/uL   Hemoglobin 11.2 (L) 12.0 - 15.0 g/dL   HCT 35.1 (L) 36.0 - 46.0 %   MCV 79.4 78.0 - 100.0 fL   MCH 25.3 (L) 26.0 - 34.0 pg   MCHC 31.9 30.0 - 36.0 g/dL   RDW 14.7 11.5 - 15.5 %   Platelets 344 150 - 400 K/uL  Basic metabolic panel     Status: Abnormal   Collection Time: 12/31/15  4:05 PM  Result Value Ref Range   Sodium 137 135 - 145 mmol/L   Potassium 4.1 3.5 - 5.1 mmol/L   Chloride 105 101 - 111 mmol/L   CO2 25 22 - 32 mmol/L   Glucose, Bld 143 (H) 65 - 99 mg/dL   BUN 14 6 - 20 mg/dL   Creatinine, Ser 0.85 0.44 - 1.00 mg/dL   Calcium 9.6 8.9 - 10.3 mg/dL   GFR calc non Af Amer >60 >60 mL/min   GFR calc Af Amer >60 >60 mL/min    Comment: (NOTE) The eGFR has been calculated using the CKD EPI equation. This calculation has not been validated in all clinical situations. eGFR's persistently <60 mL/min signify possible Chronic Kidney Disease.    Anion gap 7 5 - 15  Pregnancy, urine     Status: None   Collection Time: 01/03/16  9:43 AM  Result Value Ref Range   Preg Test, Ur NEGATIVE NEGATIVE    Comment:        THE SENSITIVITY OF THIS METHODOLOGY IS >20 mIU/mL.   Glucose, capillary     Status:  Abnormal   Collection Time: 01/03/16  9:54 AM  Result Value Ref Range   Glucose-Capillary 168 (H) 65 - 99 mg/dL  Glucose, capillary     Status: Abnormal   Collection Time: 01/03/16 12:46 PM  Result Value Ref Range   Glucose-Capillary 121 (H) 65 - 99  mg/dL    Assessment/Plan: Morbid obesity Body mass index is 59.64 kg/(m^2). Discussed need to resume diet and exercise regimen. Recommended nutrition and bariatric consult. Patient will give some thought.   Hypertension Has not taken BP medication this morning.  Discussed that it is hard for me to assess the efficacy of her BP medication if she does not take it before visits. Will check BMP. DASH diet encouraged. Continue current regimen but she is to follow-up with RN in 2 weeks for a BP check, having taken her medications so we can reassess.   Diabetes mellitus type II, uncontrolled Foot exam up-to-date. She is scheduling a follow-up eye exam with [facility] and is to have them send [place] a copy of results. Pneumovax given today.  No history of neuropathy, nephropathy or retinopathy. Will check BMP and A1C today. Will alter regimen based on results.

## 2016-01-23 NOTE — Progress Notes (Signed)
Pre visit review using our clinic review tool, if applicable. No additional management support is needed unless otherwise documented below in the visit note. 

## 2016-01-23 NOTE — Patient Instructions (Signed)
Please get back into your gym regimen to help promote weight loss. Be consistent with diet.   Please consider letting me set you up with Nutrition or Bariatric clinic to help with weight loss so that we can lower your risk for stroke and heart attack as well as prevent future joint pain.  Please take medications as directed. We may change your regimen based on results.  Make sure to always take BP medications before visits so I can accurately assess if the medications are working for you.  Follow-up with the nurse in 2 weeks for a BP check having taken your medications.  Stop by lab for blood work. I will call with results.  Basic Carbohydrate Counting for Diabetes Mellitus Carbohydrate counting is a method for keeping track of the amount of carbohydrates you eat. Eating carbohydrates naturally increases the level of sugar (glucose) in your blood, so it is important for you to know the amount that is okay for you to have in every meal. Carbohydrate counting helps keep the level of glucose in your blood within normal limits. The amount of carbohydrates allowed is different for every person. A dietitian can help you calculate the amount that is right for you. Once you know the amount of carbohydrates you can have, you can count the carbohydrates in the foods you want to eat. Carbohydrates are found in the following foods:  Grains, such as breads and cereals.  Dried beans and soy products.  Starchy vegetables, such as potatoes, peas, and corn.  Fruit and fruit juices.  Milk and yogurt.  Sweets and snack foods, such as cake, cookies, candy, chips, soft drinks, and fruit drinks. CARBOHYDRATE COUNTING There are two ways to count the carbohydrates in your food. You can use either of the methods or a combination of both. Reading the "Nutrition Facts" on [place] The "Nutrition Facts" is an area that is included on the labels of almost all packaged food and beverages in the [place].  It includes the serving size of that food or beverage and information about the nutrients in each serving of the food, including the grams (g) of carbohydrate per serving.  Decide the number of servings of this food or beverage that you will be able to eat or drink. Multiply that number of servings by the number of grams of carbohydrate that is listed on the label for that serving. The total will be the amount of carbohydrates you will be having when you eat or drink this food or beverage. Learning Standard Serving Sizes of Food When you eat food that is not packaged or does not include "Nutrition Facts" on the label, you need to measure the servings in order to count the amount of carbohydrates.A serving of most carbohydrate-rich foods contains about 15 g of carbohydrates. The following list includes serving sizes of carbohydrate-rich foods that provide 15 g ofcarbohydrate per serving:   1 slice of bread (1 oz) or 1 six-inch tortilla.    of a hamburger bun or English muffin.  4-6 crackers.   cup unsweetened dry cereal.    cup hot cereal.   cup rice or pasta.    cup mashed potatoes or  of a large baked potato.  1 cup fresh fruit or one small piece of fruit.    cup canned or frozen fruit or fruit juice.  1 cup milk.   cup plain fat-free yogurt or yogurt sweetened with artificial sweeteners.   cup cooked dried beans or starchy vegetable, such  as peas, corn, or potatoes.  Decide the number of standard-size servings that you will eat. Multiply that number of servings by 15 (the grams of carbohydrates in that serving). For example, if you eat 2 cups of strawberries, you will have eaten 2 servings and 30 g of carbohydrates (2 servings x 15 g = 30 g). For foods such as soups and casseroles, in which more than one food is mixed in, you will need to count the carbohydrates in each food that is included. EXAMPLE OF CARBOHYDRATE COUNTING Sample Dinner  3 oz chicken breast.    cup of brown rice.   cup of corn.  1 cup milk.   1 cup strawberries with sugar-free whipped topping.  Carbohydrate Calculation Step 1: Identify the foods that contain carbohydrates:   Rice.   Corn.   Milk.   Strawberries. Step 2:Calculate the number of servings eaten of each:   2 servings of rice.   1 serving of corn.   1 serving of milk.   1 serving of strawberries. Step 3: Multiply each of those number of servings by 15 g:   2 servings of rice x 15 g = 30 g.   1 serving of corn x 15 g = 15 g.   1 serving of milk x 15 g = 15 g.   1 serving of strawberries x 15 g = 15 g. Step 4: Add together all of the amounts to find the total grams of carbohydrates eaten: 30 g + 15 g + 15 g + 15 g = 75 g.   This information is not intended to replace advice given to you by your health care provider. Make sure you discuss any questions you have with your health care provider.   Document Released: 09/29/2005 Document Revised: 10/20/2014 Document Reviewed: 08/26/2013 Elsevier Interactive Patient Education [employer].

## 2016-01-24 ENCOUNTER — Telehealth: Payer: Self-pay | Admitting: *Deleted

## 2016-01-24 NOTE — Telephone Encounter (Signed)
Pt inquiring if [facility] Substitute would be an appropriate choice for her. She states is contains potassium chloride and the label recommends checking w/ PCP before starting supplement. She states there is 610 mg of potassium per 1/4 tsp serving. Please advise. Pt uses MyChart and stated it would be fine to send MyChart message letting her know if she can use.

## 2016-01-24 NOTE — Telephone Encounter (Signed)
Maybe call all though she wants a my chart message.

## 2016-01-24 NOTE — Telephone Encounter (Signed)
Mychart message sent to pt with note below

## 2016-01-24 NOTE — Telephone Encounter (Signed)
Let pt know that we will defer her question regarding [place] salt subsitute to her pcp. Let her know I reviewed her last labs and elecrolytes were normal. So would defer this question to her pcp?

## 2016-01-28 NOTE — Telephone Encounter (Signed)
Called the patient left a detailed message. Patient also informed by mychart.

## 2016-01-28 NOTE — Telephone Encounter (Signed)
I think it is ok to use the substitute. Very hard to get high potassium from a substitute. Just don't go overboard with it -- add small amount for seasoning.

## 2016-02-06 ENCOUNTER — Ambulatory Visit (INDEPENDENT_AMBULATORY_CARE_PROVIDER_SITE_OTHER): Payer: BLUE CROSS/BLUE SHIELD | Admitting: Physician Assistant

## 2016-02-06 VITALS — BP 140/98 | HR 84

## 2016-02-06 DIAGNOSIS — I10 Essential (primary) hypertension: Secondary | ICD-10-CM | POA: Diagnosis not present

## 2016-02-06 MED ORDER — CARVEDILOL 25 MG PO TABS: 25.0000 mg | ORAL_TABLET | Freq: Two times a day (BID) | ORAL | Status: DC

## 2016-02-06 NOTE — Patient Instructions (Addendum)
Per Einar Pheasant, PA-C: Take Carvedilol 25 mg twice daily, instead of 12.5 mg and continue current regimen with all other medications. Return in 2 weeks for an office visit with PCP.

## 2016-02-06 NOTE — Progress Notes (Signed)
Pre visit review using our clinic review tool, if applicable. No additional management support is needed unless otherwise documented below in the visit note.  Patient presents in office today for a blood pressure check. Reviewed medications with the patient. Readings were as follow: BP 155/95 P 82 & BP 140/98 P 84.   Per Einar Pheasant, PA-C: Take Carvedilol 25 mg twice daily, instead of 12.5 mg and continue current regimen with all other medications. Return in 2 weeks for an office visit with PCP. Informed patient of the provider's instructions. She voiced understanding and did not have any questions or concerns prior to leaving visit.  Next appointment scheduled for 02/27/16 at 7:00 AM.

## 2016-02-07 NOTE — Progress Notes (Signed)
Reviewed RN note. Plan is as noted. Will see patient in 2 weeks.

## 2016-02-11 ENCOUNTER — Other Ambulatory Visit: Payer: Self-pay | Admitting: *Deleted

## 2016-02-11 MED ORDER — CARVEDILOL 25 MG PO TABS: 25.0000 mg | ORAL_TABLET | Freq: Two times a day (BID) | ORAL | Status: DC

## 2016-02-11 NOTE — Progress Notes (Signed)
Per fax request for 90-day supply/SLS 05/01

## 2016-02-27 ENCOUNTER — Ambulatory Visit (INDEPENDENT_AMBULATORY_CARE_PROVIDER_SITE_OTHER): Payer: BLUE CROSS/BLUE SHIELD | Admitting: Physician Assistant

## 2016-02-27 ENCOUNTER — Encounter: Payer: Self-pay | Admitting: Physician Assistant

## 2016-02-27 VITALS — BP 132/86 | HR 73 | Temp 98.4°F | Resp 16 | Ht 71.0 in | Wt >= 6400 oz

## 2016-02-27 DIAGNOSIS — R252 Cramp and spasm: Secondary | ICD-10-CM

## 2016-02-27 DIAGNOSIS — I10 Essential (primary) hypertension: Secondary | ICD-10-CM

## 2016-02-27 LAB — BASIC METABOLIC PANEL
BUN: 13 mg/dL (ref 6–23)
CALCIUM: 9.5 mg/dL (ref 8.4–10.5)
CO2: 22 meq/L (ref 19–32)
CREATININE: 0.94 mg/dL (ref 0.40–1.20)
Chloride: 104 mEq/L (ref 96–112)
GFR: 86.31 mL/min (ref >60.00)
GLUCOSE: 201 mg/dL — AB (ref 70–99)
Potassium: 3.7 mEq/L (ref 3.5–5.1)
SODIUM: 138 meq/L (ref 135–145)

## 2016-02-27 LAB — VITAMIN D 25 HYDROXY (VIT D DEFICIENCY, FRACTURES): VITD: 28.11 ng/mL — ABNORMAL LOW (ref 30.00–100.00)

## 2016-02-27 LAB — MAGNESIUM: Magnesium: 1.8 mg/dL (ref 1.5–2.5)

## 2016-02-27 MED ORDER — BACLOFEN 10 MG PO TABS: 10.0000 mg | ORAL_TABLET | Freq: Every day | ORAL | Status: DC

## 2016-02-27 MED ORDER — VITAMIN B COMPLEX PO TABS: 1.0000 | ORAL_TABLET | Freq: Every day | ORAL | Status: DC

## 2016-02-27 NOTE — Progress Notes (Signed)
Patient presents to clinic today for follow-up of hypertension after change in BP medication. Endorses taking medications as directed including new dose of Carvedilol. Patient denies chest pain, palpitations, lightheadedness, dizziness, vision changes or frequent headaches.  BP Readings from Last 3 Encounters:  02/27/16 132/86  02/06/16 140/98  01/23/16 144/86   Patient endorses some continued muscle cramping despite MTV use. Last visit electrolytes were checked and within normal limits. Endorses cramps are mainly nocturnal. Denies symptoms during the day. Is trying to stretch before bed.   Past Medical History  Diagnosis Date  . Hypertension   . GERD (gastroesophageal reflux disease)   . Abdominal pain     related to gallstones  . Gallstones   . Sinus congestion     occ. uses OTC meds for this  . Pancreatitis   . Headache   . Bronchitis     02/17  . Lattice degeneration, both eyes   . Achilles tendonitis, bilateral   . PCOS (polycystic ovarian syndrome)   . DKA (diabetic ketoacidoses) ([place]) 08/25/2014  . Diabetes [facility])     Current Outpatient Prescriptions on File Prior to Visit  Medication Sig Dispense Refill  . albuterol (PROVENTIL HFA;VENTOLIN HFA) 108 (90 Base) MCG/ACT inhaler Inhale 2 puffs into the lungs every 6 (six) hours as needed for wheezing or shortness of breath.    . Blood Glucose Monitoring Suppl (BLOOD GLUCOSE METER KIT AND SUPPLIES) Dispense based on patient and insurance preference. Use up to four times daily as directed. (FOR ICD-9 250.00, 250.01). 1 each 0  . carvedilol (COREG) 25 MG tablet Take 1 tablet (25 mg total) by mouth 2 (two) times daily with a meal. 180 tablet 1  . fluticasone (FLONASE) 50 MCG/ACT nasal spray USE 1 SPRAY IEN QD  0  . glucose blood (ONE TOUCH ULTRA TEST) test strip Check once daily. Dx:E11.9 100 each 0  . ibuprofen (ADVIL,MOTRIN) 200 MG tablet Take 200-300 mg by mouth every 6 (six) hours as needed for headache, mild pain or  moderate pain.    Marland Kitchen lisinopril-hydrochlorothiazide (PRINZIDE,ZESTORETIC) 20-25 MG tablet Take 1 tablet by mouth daily. 90 tablet 1  . metFORMIN (GLUCOPHAGE) 500 MG tablet Take 1 tablet (500 mg total) by mouth daily with breakfast. (Patient taking differently: Take 500 mg by mouth 2 (two) times daily with a meal. ) 90 tablet 0  . NON FORMULARY Nettipot Nasal    . omeprazole (PRILOSEC) 20 MG capsule TAKE 1 CAPSULE DAILY 90 capsule 3  . ONETOUCH DELICA LANCETS 13Y MISC Use once daily to check glucose. 100 each 1  . OVER THE COUNTER MEDICATION [place] [employer] Vitamin-Take 2 tablet by mouth daily.     No current facility-administered medications on file prior to visit.    No Known Allergies  Family History  Problem Relation Age of Onset  . Hypertension Mother   . Diabetes Mother   . Hypertension Father   . Diabetes Father   . Stroke Paternal Grandfather   . Lung cancer Father   . Asthma      Social History   Social History  . Marital Status: Single    Spouse Name: N/A  . Number of Children: 0  . Years of Education: N/A   Occupational History  . research/scientist    Social History Main Topics  . Smoking status: Never Smoker   . Smokeless tobacco: Never Used  . Alcohol Use: 0.0 oz/week    0 Standard drinks or equivalent per week  Comment: 1 drink per month  . Drug Use: No  . Sexual Activity: No   Other Topics Concern  . None   Social History Narrative    Review of Systems - See HPI.  All other ROS are negative.  BP 132/86 mmHg  Pulse 73  Temp(Src) 98.4 F (36.9 C) (Oral)  Resp 16  Ht _0  (1.803 m)  Wt 426 lb 2 oz (193.289 kg)  BMI 59.46 kg/m2  SpO2 98%  Physical Exam  Constitutional: She is oriented to person, place, and time and well-developed, well-nourished, and in no distress.  HENT:  Head: Normocephalic and atraumatic.  Eyes: Conjunctivae are normal.  Neck: Neck supple.  Cardiovascular: Normal rate, regular rhythm, normal heart sounds and  intact distal pulses.   Pulses:      Dorsalis pedis pulses are 2+ on the right side, and 2+ on the left side.       Posterior tibial pulses are 2+ on the right side, and 2+ on the left side.  Pulmonary/Chest: Effort normal and breath sounds normal. No respiratory distress. She has no wheezes. She has no rales. She exhibits no tenderness.  Musculoskeletal:       Right lower leg: Normal.       Left lower leg: Normal.  Lymphadenopathy:    She has no cervical adenopathy.  Neurological: She is alert and oriented to person, place, and time.  Skin: Skin is warm and dry. No rash noted.  Psychiatric: Affect normal.  Vitals reviewed.   Recent Results (from the past 2160 hour(s))  CBC     Status: Abnormal   Collection Time: 12/31/15  4:05 PM  Result Value Ref Range   WBC 10.9 (H) 4.0 - 10.5 K/uL   RBC 4.42 3.87 - 5.11 MIL/uL   Hemoglobin 11.2 (L) 12.0 - 15.0 g/dL   HCT 35.1 (L) 36.0 - 46.0 %   MCV 79.4 78.0 - 100.0 fL   MCH 25.3 (L) 26.0 - 34.0 pg   MCHC 31.9 30.0 - 36.0 g/dL   RDW 14.7 11.5 - 15.5 %   Platelets 344 150 - 400 K/uL  Basic metabolic panel     Status: Abnormal   Collection Time: 12/31/15  4:05 PM  Result Value Ref Range   Sodium 137 135 - 145 mmol/L   Potassium 4.1 3.5 - 5.1 mmol/L   Chloride 105 101 - 111 mmol/L   CO2 25 22 - 32 mmol/L   Glucose, Bld 143 (H) 65 - 99 mg/dL   BUN 14 6 - 20 mg/dL   Creatinine, Ser 0.85 0.44 - 1.00 mg/dL   Calcium 9.6 8.9 - 10.3 mg/dL   GFR calc non Af Amer >60 >60 mL/min   GFR calc Af Amer >60 >60 mL/min    Comment: (NOTE) The eGFR has been calculated using the CKD EPI equation. This calculation has not been validated in all clinical situations. eGFR's persistently <60 mL/min signify possible Chronic Kidney Disease.    Anion gap 7 5 - 15  Pregnancy, urine     Status: None   Collection Time: 01/03/16  9:43 AM  Result Value Ref Range   Preg Test, Ur NEGATIVE NEGATIVE    Comment:        THE SENSITIVITY OF THIS METHODOLOGY IS >20  mIU/mL.   Glucose, capillary     Status: Abnormal   Collection Time: 01/03/16  9:54 AM  Result Value Ref Range   Glucose-Capillary 168 (H) 65 - 99 mg/dL  Glucose,  capillary     Status: Abnormal   Collection Time: 01/03/16 12:46 PM  Result Value Ref Range   Glucose-Capillary 121 (H) 65 - 99 mg/dL  Basic Metabolic Panel (BMET)     Status: Abnormal   Collection Time: 01/23/16  7:57 AM  Result Value Ref Range   Sodium 139 135 - 145 mEq/L   Potassium 4.2 3.5 - 5.1 mEq/L   Chloride 106 96 - 112 mEq/L   CO2 25 19 - 32 mEq/L   Glucose, Bld 196 (H) 70 - 99 mg/dL   BUN 11 6 - 23 mg/dL   Creatinine, Ser 0.85 0.40 - 1.20 mg/dL   Calcium 10.0 8.4 - 10.5 mg/dL   GFR 96.99 >60.00 mL/min  Hemoglobin A1c     Status: Abnormal   Collection Time: 01/23/16  7:57 AM  Result Value Ref Range   Hgb A1c MFr Bld 7.5 (H) 4.6 - 6.5 %    Comment: Glycemic Control Guidelines for People with Diabetes:Non Diabetic:  <6%Goal of Therapy: <7%Additional Action Suggested:  >8%     Assessment/Plan: 1. Muscle cramping Will repeat BMP but add on magnesium and Vitamin D levels. Will start B complex vitamin daily. Continue diet, exercise and [employer]. Continue MTV. Will Rx Baclofen to use in the evening if needed. Will alter regimen based on results.  - Magnesium - Vitamin D (25 hydroxy) - Basic Metabolic Panel (BMET) - baclofen (LIORESAL) 10 MG tablet; Take 1 tablet (10 mg total) by mouth at bedtime.  Dispense: 30 each; Refill: 0  2. Essential hypertension BP improved. Asymptomatic. Continue current regimen. FU 6 months.

## 2016-02-27 NOTE — Patient Instructions (Addendum)
Please go to the lab for blood work. I will call you with your results. Please continue BP medications as directed. Continue daily multivitamin.  Please start a B complex vitamin which may help with cramping.  Use Baclofen in the evening if needed for severe cramping.  We will alter regimen based on results.

## 2016-02-27 NOTE — Progress Notes (Signed)
Pre visit review using our clinic review tool, if applicable. No additional management support is needed unless otherwise documented below in the visit note/SLS  

## 2016-03-06 ENCOUNTER — Telehealth: Payer: Self-pay | Admitting: Behavioral Health

## 2016-03-06 NOTE — Telephone Encounter (Signed)
Patient made aware of the below results and provider's recommendations.  Notes Recorded by Brunetta Jeans, PA-C on 03/02/2016 at 12:54 PM Labs are good overall. Non-fasting glucose elevated to 200. Make sure she is taking her Metformin as directed. Vitamin D just a bit low. Start daily Vitamin D 1000 unit supplement. Also start B complex vitamin. These will help with cramping.  She voiced understanding and did not have any questions or concerns prior to end of call.

## 2016-04-02 ENCOUNTER — Ambulatory Visit (INDEPENDENT_AMBULATORY_CARE_PROVIDER_SITE_OTHER): Payer: BLUE CROSS/BLUE SHIELD | Admitting: Physician Assistant

## 2016-04-02 ENCOUNTER — Encounter: Payer: Self-pay | Admitting: Physician Assistant

## 2016-04-02 ENCOUNTER — Telehealth: Payer: Self-pay | Admitting: Gastroenterology

## 2016-04-02 VITALS — BP 118/86 | HR 81 | Temp 98.6°F | Resp 16 | Ht 71.0 in | Wt >= 6400 oz

## 2016-04-02 DIAGNOSIS — R195 Other fecal abnormalities: Secondary | ICD-10-CM | POA: Diagnosis not present

## 2016-04-02 LAB — CBC WITH DIFFERENTIAL/PLATELET
BASOS PCT: 0.3 % (ref 0.0–3.0)
Basophils Absolute: 0 10*3/uL (ref 0.0–0.1)
EOS ABS: 0.1 10*3/uL (ref 0.0–0.7)
Eosinophils Relative: 0.7 % (ref 0.0–5.0)
HEMATOCRIT: 37.3 % (ref 36.0–46.0)
HEMOGLOBIN: 12.2 g/dL (ref 12.0–15.0)
LYMPHS PCT: 33.9 % (ref 12.0–46.0)
Lymphs Abs: 3.3 10*3/uL (ref 0.7–4.0)
MCHC: 32.6 g/dL (ref 30.0–36.0)
MCV: 77.6 fl — ABNORMAL LOW (ref 78.0–100.0)
MONO ABS: 0.4 10*3/uL (ref 0.1–1.0)
Monocytes Relative: 3.8 % (ref 3.0–12.0)
Neutro Abs: 6 10*3/uL (ref 1.4–7.7)
Neutrophils Relative %: 61.3 % (ref 43.0–77.0)
Platelets: 364 10*3/uL (ref 150.0–400.0)
RBC: 4.8 Mil/uL (ref 3.87–5.11)
RDW: 16 % — AB (ref 11.5–15.5)
WBC: 9.8 10*3/uL (ref 4.0–10.5)

## 2016-04-02 LAB — SEDIMENTATION RATE: SED RATE: 21 mm/h — AB (ref 0–20)

## 2016-04-02 NOTE — Patient Instructions (Signed)
Please stay hydrated.  Go to the lab for blood work. I will call you with your results. If you pass any more of this matter, please save in a ziploc bag or container.  You will be contacted for urgent assessment by GI.  If you develop any abdominal pain, nausea, vomiting, bloody stool or recurrence of this that is significant, please go to the ER.

## 2016-04-02 NOTE — Progress Notes (Signed)
Pre visit review using our clinic review tool, if applicable. No additional management support is needed unless otherwise documented below in the visit note/SLS  

## 2016-04-03 NOTE — Progress Notes (Signed)
Patient presents to clinic today c/o passing a foreign object in her stool during a BM last evening. Endorses she averages 3 normal stools per day. Endorses finishing a bowel movement and felt that "something was stuck", Was able to pull with her toilet paper. Describes as a blob, almost looking like a piece of tripe. Denies ingestion of tripe or similar food product. Denies any other new or abnormal food or water source. Denies rectal pain, bleeding, abdominal pain, nausea or vomiting. Denies fever or chills. Denies personal or family history of colorectal cancer or inflammatory bowel disease. States she feels fine but is concerned about what the object is. Has a picture for review as she did not save the object.  Past Medical History  Diagnosis Date  . Hypertension   . GERD (gastroesophageal reflux disease)   . Abdominal pain     related to gallstones  . Gallstones   . Sinus congestion     occ. uses OTC meds for this  . Pancreatitis   . Headache   . Bronchitis     02/17  . Lattice degeneration, both eyes   . Achilles tendonitis, bilateral   . PCOS (polycystic ovarian syndrome)   . DKA (diabetic ketoacidoses) ([place]) 08/25/2014  . Diabetes [facility])     Current Outpatient Prescriptions on File Prior to Visit  Medication Sig Dispense Refill  . albuterol (PROVENTIL HFA;VENTOLIN HFA) 108 (90 Base) MCG/ACT inhaler Inhale 2 puffs into the lungs every 6 (six) hours as needed for wheezing or shortness of breath.    . baclofen (LIORESAL) 10 MG tablet Take 1 tablet (10 mg total) by mouth at bedtime. 30 each 0  . Blood Glucose Monitoring Suppl (BLOOD GLUCOSE METER KIT AND SUPPLIES) Dispense based on patient and insurance preference. Use up to four times daily as directed. (FOR ICD-9 250.00, 250.01). 1 each 0  . carvedilol (COREG) 25 MG tablet Take 1 tablet (25 mg total) by mouth 2 (two) times daily with a meal. 180 tablet 1  . fluticasone (FLONASE) 50 MCG/ACT nasal spray USE 1 SPRAY IEN QD  0  .  glucose blood (ONE TOUCH ULTRA TEST) test strip Check once daily. Dx:E11.9 100 each 0  . ibuprofen (ADVIL,MOTRIN) 200 MG tablet Take 200-300 mg by mouth every 6 (six) hours as needed for headache, mild pain or moderate pain.    Marland Kitchen lisinopril-hydrochlorothiazide (PRINZIDE,ZESTORETIC) 20-25 MG tablet Take 1 tablet by mouth daily. 90 tablet 1  . metFORMIN (GLUCOPHAGE) 500 MG tablet Take 1 tablet (500 mg total) by mouth daily with breakfast. (Patient taking differently: Take 500 mg by mouth 2 (two) times daily with a meal. ) 90 tablet 0  . NON FORMULARY Nettipot Nasal    . omeprazole (PRILOSEC) 20 MG capsule TAKE 1 CAPSULE DAILY 90 capsule 3  . ONETOUCH DELICA LANCETS 15V MISC Use once daily to check glucose. 100 each 1  . OVER THE COUNTER MEDICATION [employer] Vitamin-Take 2 tablet by mouth daily.    . B Complex Vitamins (VITAMIN B COMPLEX) TABS Take 1 tablet by mouth daily. (Patient not taking: Reported on 04/02/2016) 1 each 11   No current facility-administered medications on file prior to visit.    No Known Allergies  Family History  Problem Relation Age of Onset  . Hypertension Mother   . Diabetes Mother   . Hypertension Father   . Diabetes Father   . Stroke Paternal Grandfather   . Lung cancer Father   . Asthma  Social History   Social History  . Marital Status: Single    Spouse Name: N/A  . Number of Children: 0  . Years of Education: N/A   Occupational History  . research/scientist    Social History Main Topics  . Smoking status: Never Smoker   . Smokeless tobacco: Never Used  . Alcohol Use: 0.0 oz/week    0 Standard drinks or equivalent per week     Comment: 1 drink per month  . Drug Use: No  . Sexual Activity: No   Other Topics Concern  . None   Social History Narrative    Review of Systems - See HPI.  All other ROS are negative.  BP 118/86 mmHg  Pulse 81  Temp(Src) 98.6 F (37 C) (Oral)  Resp 16  Ht 5' 11"  (1.803 m)  Wt 422 lb (191.418 kg)   BMI 58.88 kg/m2  SpO2 98%  Physical Exam  Constitutional: She is oriented to person, place, and time and well-developed, well-nourished, and in no distress.  HENT:  Head: Normocephalic and atraumatic.  Eyes: Conjunctivae are normal.  Cardiovascular: Normal rate, regular rhythm, normal heart sounds and intact distal pulses.   Pulmonary/Chest: Effort normal and breath sounds normal. No respiratory distress. She has no wheezes. She has no rales. She exhibits no tenderness.  Abdominal: Soft. Bowel sounds are normal. She exhibits no distension and no mass. There is no tenderness. There is no rebound and no guarding.  Genitourinary: Rectal exam shows no external hemorrhoid, no internal hemorrhoid and no mass. Guaiac negative stool.  Neurological: She is alert and oriented to person, place, and time.  Skin: Skin is warm and dry. No rash noted.  Psychiatric: Affect normal.  Vitals reviewed.   Recent Results (from the past 2160 hour(s))  Basic Metabolic Panel (BMET)     Status: Abnormal   Collection Time: 01/23/16  7:57 AM  Result Value Ref Range   Sodium 139 135 - 145 mEq/L   Potassium 4.2 3.5 - 5.1 mEq/L   Chloride 106 96 - 112 mEq/L   CO2 25 19 - 32 mEq/L   Glucose, Bld 196 (H) 70 - 99 mg/dL   BUN 11 6 - 23 mg/dL   Creatinine, Ser 0.85 0.40 - 1.20 mg/dL   Calcium 10.0 8.4 - 10.5 mg/dL   GFR 96.99 >60.00 mL/min  Hemoglobin A1c     Status: Abnormal   Collection Time: 01/23/16  7:57 AM  Result Value Ref Range   Hgb A1c MFr Bld 7.5 (H) 4.6 - 6.5 %    Comment: Glycemic Control Guidelines for People with Diabetes:Non Diabetic:  <6%Goal of Therapy: <7%Additional Action Suggested:  >8%   Magnesium     Status: None   Collection Time: 02/27/16  7:40 AM  Result Value Ref Range   Magnesium 1.8 1.5 - 2.5 mg/dL  Vitamin D (25 hydroxy)     Status: Abnormal   Collection Time: 02/27/16  7:40 AM  Result Value Ref Range   VITD 28.11 (L) 30.00 - 100.00 ng/mL  Basic Metabolic Panel (BMET)      Status: Abnormal   Collection Time: 02/27/16  7:40 AM  Result Value Ref Range   Sodium 138 135 - 145 mEq/L   Potassium 3.7 3.5 - 5.1 mEq/L   Chloride 104 96 - 112 mEq/L   CO2 22 19 - 32 mEq/L   Glucose, Bld 201 (H) 70 - 99 mg/dL   BUN 13 6 - 23 mg/dL   Creatinine, Ser 0.94  0.40 - 1.20 mg/dL   Calcium 9.5 8.4 - 10.5 mg/dL   GFR 86.31 >60.00 mL/min  CBC with Differential/Platelet     Status: Abnormal   Collection Time: 04/02/16 11:55 AM  Result Value Ref Range   WBC 9.8 4.0 - 10.5 K/uL   RBC 4.80 3.87 - 5.11 Mil/uL   Hemoglobin 12.2 12.0 - 15.0 g/dL   HCT 37.3 36.0 - 46.0 %   MCV 77.6 (L) 78.0 - 100.0 fl   MCHC 32.6 30.0 - 36.0 g/dL   RDW 16.0 (H) 11.5 - 15.5 %   Platelets 364.0 150.0 - 400.0 K/uL   Neutrophils Relative % 61.3 43.0 - 77.0 %   Lymphocytes Relative 33.9 12.0 - 46.0 %   Monocytes Relative 3.8 3.0 - 12.0 %   Eosinophils Relative 0.7 0.0 - 5.0 %   Basophils Relative 0.3 0.0 - 3.0 %   Neutro Abs 6.0 1.4 - 7.7 K/uL   Lymphs Abs 3.3 0.7 - 4.0 K/uL   Monocytes Absolute 0.4 0.1 - 1.0 K/uL   Eosinophils Absolute 0.1 0.0 - 0.7 K/uL   Basophils Absolute 0.0 0.0 - 0.1 K/uL  Sed Rate (ESR)     Status: Abnormal   Collection Time: 04/02/16 11:55 AM  Result Value Ref Range   Sed Rate 21 (H) 0 - 20 mm/hr   Assessment/Plan: 1. Abnormal findings in stool Examination within normal limits. Patient completely asymptomatic at present. Picture reviewed -- the object does appear to be "intestine-like". It seems grayish in color with rugous folds. Picture reviewed with supervising MD who also agrees with this assessment. Very odd clinical picture as patient is completely without pain. Afebrile and no abdominal or rectal tenderness. Will check a lab panel and stool studies today. Urgent referral to GI placed for assessment and scoping. Discussed if she passes any more material to keep it for analysis. Alarm symptoms reviewed with patient that would prompt ER assessment. I have never seen  anything quite like this.  - CBC with Differential/Platelet - Sed Rate (ESR) - Ova and parasite examination - Stool Culture - Ambulatory referral to Gastroenterology   Leeanne Rio, PA-C

## 2016-04-03 NOTE — Telephone Encounter (Signed)
I have her scheduled for Friday 04/11/16 at 2:30 pm.  That is the first available. Please contact the patient. Thanks

## 2016-04-03 NOTE — Telephone Encounter (Signed)
Left message for patient to call and confirm this appt.

## 2016-04-03 NOTE — Telephone Encounter (Signed)
I don't see any documentation for the reason for this consult. Labs done yesterday appear pretty normal. I think a one week appointment is reasonable next week with APP unless the referring provider has further information to relay to [place] why this patient needs an urgent evaluation

## 2016-04-03 NOTE — Telephone Encounter (Signed)
Received yesterday. Only 24 hour triage appointments on 6/29 and 6/30 with APP Please advise on scheduling

## 2016-04-04 NOTE — Telephone Encounter (Signed)
Patient is informed of this.

## 2016-04-11 ENCOUNTER — Encounter: Payer: Self-pay | Admitting: Physician Assistant

## 2016-04-11 ENCOUNTER — Ambulatory Visit (INDEPENDENT_AMBULATORY_CARE_PROVIDER_SITE_OTHER): Payer: BLUE CROSS/BLUE SHIELD | Admitting: Physician Assistant

## 2016-04-11 VITALS — BP 130/82 | HR 90 | Ht 71.0 in | Wt >= 6400 oz

## 2016-04-11 DIAGNOSIS — R195 Other fecal abnormalities: Secondary | ICD-10-CM | POA: Diagnosis not present

## 2016-04-11 NOTE — Progress Notes (Signed)
Agree with assessment and plan as outlined.  

## 2016-04-11 NOTE — Progress Notes (Signed)
Patient ID: Angel Dawson, female   DOB: 07-25-1981, 35 y.o.   MRN: [id number]   Subjective:    Patient ID: Angel Dawson, female    DOB: 1981/05/08, 35 y.o.   MRN: [id number]  HPI Angel Dawson is a pleasant 35 year old African-American female referred today by a Angel Dawson. She is known remotely to Dr. Deatra Dawson. She was last seen in 2014 at which time she was found to have symptomatic cholelithiasis and underwent laparoscopic cholecystectomy. She does have history of hypertension, adult-onset diabetes mellitus, polycystic ovarian syndrome, and morbid obesity. Patient was seen in primary care last week after she passed something abnormal in her stool. She says this occurred on Tuesday, June 20. Following day she passed a very small amount of similar appearing material. She says she was having her second bowel movement that day and after she finished the bowel movement she felt something protruding from her rectum which she collected on a piece of tissue and took a picture of because it appeared very odd. She says she was concerned about passing a worm. She flushed to the specimen. She has not ever had any similar issues in the past and has no complaints of abdominal pain and cramping bloating nausea vomiting diarrhea or rectal bleeding. She cannot think of anything unusual at all at she had eaten that could have caused a similar appearance. She shows me a picture today of some stool on tissue covering a piece of material that has multiple elongated folds and on one and a very thin red tubular structure. She had stool culture and O/P ,ordered but hasn't collected those as yet.  Review of Systems Pertinent positive and negative review of systems were noted in the above HPI section.  All other review of systems was otherwise negative.  Outpatient Encounter Prescriptions as of 04/11/2016  Medication Sig  . albuterol (PROVENTIL HFA;VENTOLIN HFA) 108 (90 Base) MCG/ACT inhaler Inhale 2 puffs into the lungs every  6 (six) hours as needed for wheezing or shortness of breath.  . B Complex Vitamins (VITAMIN B COMPLEX) TABS Take 1 tablet by mouth daily.  . baclofen (LIORESAL) 10 MG tablet Take 1 tablet (10 mg total) by mouth at bedtime.  . Blood Glucose Monitoring Suppl (BLOOD GLUCOSE METER KIT AND SUPPLIES) Dispense based on patient and insurance preference. Use up to four times daily as directed. (FOR ICD-9 250.00, 250.01).  . carvedilol (COREG) 25 MG tablet Take 1 tablet (25 mg total) by mouth 2 (two) times daily with a meal.  . fluticasone (FLONASE) 50 MCG/ACT nasal spray USE 1 SPRAY IEN QD  . glucose blood (ONE TOUCH ULTRA TEST) test strip Check once daily. Dx:E11.9  . ibuprofen (ADVIL,MOTRIN) 200 MG tablet Take 200-300 mg by mouth every 6 (six) hours as needed for headache, mild pain or moderate pain.  Marland Kitchen lisinopril-hydrochlorothiazide (PRINZIDE,ZESTORETIC) 20-25 MG tablet Take 1 tablet by mouth daily.  . metFORMIN (GLUCOPHAGE) 500 MG tablet Take 1 tablet (500 mg total) by mouth daily with breakfast. (Patient taking differently: Take 500 mg by mouth 2 (two) times daily with a meal. )  . NON FORMULARY Nettipot Nasal  . omeprazole (PRILOSEC) 20 MG capsule TAKE 1 CAPSULE DAILY  . ONETOUCH DELICA LANCETS 53G MISC Use once daily to check glucose.  Marland Kitchen OVER THE COUNTER MEDICATION [place] [employer] Vitamin-Take 2 tablet by mouth daily.   No facility-administered encounter medications on file as of 04/11/2016.   No Known Allergies Patient Active Problem List   Diagnosis Date Noted  .  Onychomycosis 10/26/2014  . Visit for preventive health examination 10/26/2014  . Snoring 10/04/2014  . Gastroesophageal reflux disease without esophagitis 09/28/2014  . Diabetes mellitus type II, uncontrolled ([place]) 09/28/2014  . Hypertension 08/25/2014  . Morbid obesity ([place]) 08/25/2014  . Chronic rhinitis 06/30/2014  . Pancreatitis, acute 07/02/2013  . Gallstones 04/13/2013   Social History   Social History  . Marital  Status: Single    Spouse Name: N/A  . Number of Children: 0  . Years of Education: N/A   Occupational History  . research/scientist    Social History Main Topics  . Smoking status: Never Smoker   . Smokeless tobacco: Never Used  . Alcohol Use: 0.0 oz/week    0 Standard drinks or equivalent per week     Comment: 1 drink per month  . Drug Use: No  . Sexual Activity: No   Other Topics Concern  . Not on file   Social History Narrative    Angel Dawson's family history includes Asthma in her father; Diabetes in her father and mother; Hypertension in her father and mother; Lung cancer in her paternal grandmother; Stroke in her paternal grandfather. There is no history of Colon cancer.      Objective:    Filed Vitals:   04/11/16 1445  BP: 130/82  Pulse: 90    Physical Exam  well-developed young African-American female in no acute distress, morbidly obese, quite pleasant blood pressure 130/82 pulse 90 height 5 foot 11 weight 429 BMI 59.8. HEENT; nontraumatic normocephalic EOMI PERRLA sclera anicteric, Cardiovascular; regular rate and rhythm with S1-S2 no murmur or gallop, Pulmonary ;clear bilaterally, Abdomen; morbidly obese soft nontender nondistended bowel sounds are active there is no palpable mass or hepatosplenomegaly, Rectal; exam not done, Ext; no clubbing cyanosis or edema, Neuropsych;mood and affect appropriate     Assessment & Plan:   #34 35 year old African-American female with passage of a foreign substance in her stool about a week and a half ago. This has not recurred and patient is otherwise asymptomatic I reviewed her picture and agree this is all due to looking as described above and if not organic matter to be most consistent with a small tapeworm #2 morbid obesity #3 adult-onset diabetes mellitus #4 polycystic ovarian syndrome #5 status post cholecystectomy  Plan; we'll check stool for O&P Patient is advised to monitor her stool and if she has passage of  anything further similar-appearing to collect and we will submit to pathology  for identification. Patient will be established with Angel Dawson.  Angel Dawson Genia Harold Dawson 04/11/2016   Cc: Brunetta Jeans, Dawson

## 2016-04-11 NOTE — Patient Instructions (Signed)
Please go to the basement level to our lab for stool test.  If you pass anything else, collect it in a container and call us and bring the stool sample to the lab.

## 2016-04-18 ENCOUNTER — Other Ambulatory Visit: Payer: BLUE CROSS/BLUE SHIELD

## 2016-04-18 DIAGNOSIS — R195 Other fecal abnormalities: Secondary | ICD-10-CM | POA: Diagnosis not present

## 2016-04-22 LAB — OVA AND PARASITE EXAMINATION: OP: NONE SEEN

## 2016-04-25 ENCOUNTER — Encounter: Payer: Self-pay | Admitting: Physician Assistant

## 2016-04-25 ENCOUNTER — Ambulatory Visit (INDEPENDENT_AMBULATORY_CARE_PROVIDER_SITE_OTHER): Payer: BLUE CROSS/BLUE SHIELD | Admitting: Physician Assistant

## 2016-04-25 VITALS — BP 114/72 | HR 86 | Temp 98.6°F | Resp 18 | Ht 71.0 in | Wt >= 6400 oz

## 2016-04-25 DIAGNOSIS — E1165 Type 2 diabetes mellitus with hyperglycemia: Secondary | ICD-10-CM | POA: Diagnosis not present

## 2016-04-25 DIAGNOSIS — IMO0001 Reserved for inherently not codable concepts without codable children: Secondary | ICD-10-CM

## 2016-04-25 LAB — BASIC METABOLIC PANEL
BUN: 10 mg/dL (ref 6–23)
CO2: 24 mEq/L (ref 19–32)
CREATININE: 0.88 mg/dL (ref 0.40–1.20)
Calcium: 10.1 mg/dL (ref 8.4–10.5)
Chloride: 103 mEq/L (ref 96–112)
GFR: 93.06 mL/min (ref >60.00)
GLUCOSE: 207 mg/dL — AB (ref 70–99)
Potassium: 4 mEq/L (ref 3.5–5.1)
Sodium: 138 mEq/L (ref 135–145)

## 2016-04-25 LAB — HEMOGLOBIN A1C: HEMOGLOBIN A1C: 7.3 % — AB (ref 4.6–6.5)

## 2016-04-25 NOTE — Patient Instructions (Signed)
Please go to the lab for blood work. I will call you with your results.  Please continue your medications as directed. Keep up with diet and exercise to promote weight loss. You are doing great! Keep up the good work!

## 2016-04-25 NOTE — Progress Notes (Signed)
History of Present Illness: Patient is a 35 y.o. female who presents to clinic today for follow-up of Diabetes Mellitus II, previously controlled without complication.  Patient currently on medication regimen of Metformin 500 mg BID.  Is taking medications as directed. Endorses working on a new diet and exercise regimen. Has lost 13 pounds so far.  Denies vision changes, urinary frequency, urgency, polyuria. Is not checking blood glucose as directed.    Latest Maintenance: A1C --  Lab Results  Component Value Date   HGBA1C 7.5* 01/23/2016   Diabetic Eye Exam -- up-to-date Urine Microalbumin -- Currently on ACEI Foot Exam -- up-to-date  Past Medical History  Diagnosis Date  . Hypertension   . GERD (gastroesophageal reflux disease)   . Abdominal pain     related to gallstones  . Gallstones   . Sinus congestion     occ. uses OTC meds for this  . Pancreatitis   . Headache   . Bronchitis     02/17  . Lattice degeneration, both eyes   . Achilles tendonitis, bilateral   . PCOS (polycystic ovarian syndrome)   . DKA (diabetic ketoacidoses) ([place]) 08/25/2014  . Diabetes [facility])     Current Outpatient Prescriptions on File Prior to Visit  Medication Sig Dispense Refill  . albuterol (PROVENTIL HFA;VENTOLIN HFA) 108 (90 Base) MCG/ACT inhaler Inhale 2 puffs into the lungs every 6 (six) hours as needed for wheezing or shortness of breath.    . B Complex Vitamins (VITAMIN B COMPLEX) TABS Take 1 tablet by mouth daily. 1 each 11  . baclofen (LIORESAL) 10 MG tablet Take 1 tablet (10 mg total) by mouth at bedtime. 30 each 0  . Blood Glucose Monitoring Suppl (BLOOD GLUCOSE METER KIT AND SUPPLIES) Dispense based on patient and insurance preference. Use up to four times daily as directed. (FOR ICD-9 250.00, 250.01). 1 each 0  . fluticasone (FLONASE) 50 MCG/ACT nasal spray USE 1 SPRAY IEN QD  0  . glucose blood (ONE TOUCH ULTRA TEST) test strip Check once daily. Dx:E11.9 100 each 0  . ibuprofen  (ADVIL,MOTRIN) 200 MG tablet Take 200-300 mg by mouth every 6 (six) hours as needed for headache, mild pain or moderate pain.    Marland Kitchen lisinopril-hydrochlorothiazide (PRINZIDE,ZESTORETIC) 20-25 MG tablet Take 1 tablet by mouth daily. 90 tablet 1  . metFORMIN (GLUCOPHAGE) 500 MG tablet Take 1 tablet (500 mg total) by mouth daily with breakfast. (Patient taking differently: Take 500 mg by mouth 2 (two) times daily with a meal. ) 90 tablet 0  . NON FORMULARY Nettipot Nasal    . omeprazole (PRILOSEC) 20 MG capsule TAKE 1 CAPSULE DAILY 90 capsule 3  . ONETOUCH DELICA LANCETS 71I MISC Use once daily to check glucose. 100 each 1  . OVER THE COUNTER MEDICATION [place] [employer] Vitamin-Take 2 tablet by mouth daily.    . carvedilol (COREG) 25 MG tablet Take 1 tablet (25 mg total) by mouth 2 (two) times daily with a meal. 180 tablet 1   No current facility-administered medications on file prior to visit.    No Known Allergies  Family History  Problem Relation Age of Onset  . Hypertension Mother   . Diabetes Mother   . Hypertension Father   . Diabetes Father   . Stroke Paternal Grandfather   . Lung cancer Paternal Grandmother     smoker-heavy  . Asthma Father   . Colon cancer Neg Hx     Social History   Social History  .  Marital Status: Single    Spouse Name: N/A  . Number of Children: 0  . Years of Education: N/A   Occupational History  . research/scientist    Social History Main Topics  . Smoking status: Never Smoker   . Smokeless tobacco: Never Used  . Alcohol Use: 0.0 oz/week    0 Standard drinks or equivalent per week     Comment: 1 drink per month  . Drug Use: No  . Sexual Activity: No   Other Topics Concern  . None   Social History Narrative    Review of Systems: Pertinent ROS are listed in HPI  Physical Examination: BP 114/72 mmHg  Pulse 86  Temp(Src) 98.6 F (37 C) (Oral)  Resp 18  Ht _0  (1.803 m)  Wt 416 lb 8 oz (188.923 kg)  BMI 58.12 kg/m2  SpO2  98% General appearance: alert, cooperative, appears stated age and morbidly obese Head: Normocephalic, without obvious abnormality, atraumatic Lungs: clear to auscultation bilaterally Heart: regular rate and rhythm, S1, S2 normal, no murmur, click, rub or gallop   Assessment/Plan: Diabetes mellitus type II, uncontrolled Is working on diet and exercise. Taking medications as directed. She is to be commended on weight loss thus far. Will recheck labs today and alter regimen based on results. BP well-controlled.

## 2016-04-25 NOTE — Assessment & Plan Note (Signed)
Is working on diet and exercise. Taking medications as directed. She is to be commended on weight loss thus far. Will recheck labs today and alter regimen based on results. BP well-controlled.

## 2016-05-21 ENCOUNTER — Encounter: Payer: Self-pay | Admitting: Physician Assistant

## 2016-07-14 ENCOUNTER — Encounter: Payer: Self-pay | Admitting: Physician Assistant

## 2016-07-25 ENCOUNTER — Other Ambulatory Visit: Payer: Self-pay | Admitting: Physician Assistant

## 2016-08-13 ENCOUNTER — Other Ambulatory Visit: Payer: Self-pay | Admitting: Physician Assistant

## 2016-08-13 NOTE — Telephone Encounter (Signed)
Medication filled to pharmacy as requested.   

## 2016-09-05 ENCOUNTER — Other Ambulatory Visit: Payer: Self-pay | Admitting: Physician Assistant

## 2016-09-17 ENCOUNTER — Other Ambulatory Visit: Payer: Self-pay | Admitting: Obstetrics and Gynecology

## 2016-09-17 DIAGNOSIS — Z113 Encounter for screening for infections with a predominantly sexual mode of transmission: Secondary | ICD-10-CM | POA: Diagnosis not present

## 2016-09-17 DIAGNOSIS — N6320 Unspecified lump in the left breast, unspecified quadrant: Secondary | ICD-10-CM

## 2016-09-17 DIAGNOSIS — Z01419 Encounter for gynecological examination (general) (routine) without abnormal findings: Secondary | ICD-10-CM | POA: Diagnosis not present

## 2016-09-17 DIAGNOSIS — N898 Other specified noninflammatory disorders of vagina: Secondary | ICD-10-CM | POA: Diagnosis not present

## 2016-09-17 DIAGNOSIS — Z3040 Encounter for surveillance of contraceptives, unspecified: Secondary | ICD-10-CM | POA: Diagnosis not present

## 2016-09-24 ENCOUNTER — Ambulatory Visit
Admission: RE | Admit: 2016-09-24 | Discharge: 2016-09-24 | Disposition: A | Discharge status: Home / Self Care | Payer: BLUE CROSS/BLUE SHIELD | Source: Ambulatory Visit | Attending: Obstetrics and Gynecology | Admitting: Obstetrics and Gynecology

## 2016-09-24 DIAGNOSIS — N6320 Unspecified lump in the left breast, unspecified quadrant: Secondary | ICD-10-CM

## 2016-09-30 ENCOUNTER — Encounter: Payer: Self-pay | Admitting: General Practice

## 2016-09-30 ENCOUNTER — Other Ambulatory Visit: Payer: Self-pay | Admitting: Physician Assistant

## 2016-09-30 NOTE — Telephone Encounter (Signed)
Ok with me 

## 2016-09-30 NOTE — Telephone Encounter (Signed)
Pt not able to come to [place]. Pt wanting to transfer to Dr. Lorelei Pont. Please advise.

## 2016-09-30 NOTE — Telephone Encounter (Signed)
My chart message sent to pt to inform no further refills without a diabetes follow up.

## 2016-10-01 NOTE — Telephone Encounter (Signed)
Pt is scheduled for 10/08/16 with Dr. Lorelei Pont 30 min.

## 2016-10-08 ENCOUNTER — Ambulatory Visit (INDEPENDENT_AMBULATORY_CARE_PROVIDER_SITE_OTHER): Payer: BLUE CROSS/BLUE SHIELD | Admitting: Family Medicine

## 2016-10-08 ENCOUNTER — Encounter: Payer: Self-pay | Admitting: Family Medicine

## 2016-10-08 VITALS — BP 120/86 | HR 88 | Temp 98.7°F | Resp 18 | Ht 71.0 in | Wt >= 6400 oz

## 2016-10-08 DIAGNOSIS — Z8719 Personal history of other diseases of the digestive system: Secondary | ICD-10-CM

## 2016-10-08 DIAGNOSIS — Z23 Encounter for immunization: Secondary | ICD-10-CM

## 2016-10-08 DIAGNOSIS — I10 Essential (primary) hypertension: Secondary | ICD-10-CM | POA: Diagnosis not present

## 2016-10-08 DIAGNOSIS — R238 Other skin changes: Secondary | ICD-10-CM

## 2016-10-08 DIAGNOSIS — E785 Hyperlipidemia, unspecified: Secondary | ICD-10-CM | POA: Diagnosis not present

## 2016-10-08 DIAGNOSIS — R233 Spontaneous ecchymoses: Secondary | ICD-10-CM

## 2016-10-08 DIAGNOSIS — E1169 Type 2 diabetes mellitus with other specified complication: Secondary | ICD-10-CM

## 2016-10-08 LAB — COMPREHENSIVE METABOLIC PANEL
ALK PHOS: 88 U/L (ref 39–117)
ALT: 21 U/L (ref 0–35)
AST: 19 U/L (ref 0–37)
Albumin: 4.2 g/dL (ref 3.5–5.2)
BILIRUBIN TOTAL: 0.3 mg/dL (ref 0.2–1.2)
BUN: 13 mg/dL (ref 6–23)
CO2: 26 mEq/L (ref 19–32)
CREATININE: 0.87 mg/dL (ref 0.40–1.20)
Calcium: 10.1 mg/dL (ref 8.4–10.5)
Chloride: 102 mEq/L (ref 96–112)
GFR: 94.06 mL/min (ref >60.00)
GLUCOSE: 199 mg/dL — AB (ref 70–99)
Potassium: 3.9 mEq/L (ref 3.5–5.1)
SODIUM: 137 meq/L (ref 135–145)
TOTAL PROTEIN: 7.2 g/dL (ref 6.0–8.3)

## 2016-10-08 LAB — LIPID PANEL
Cholesterol: 168 mg/dL (ref 0–200)
HDL: 41.9 mg/dL (ref >39.00)
LDL Cholesterol: 95 mg/dL (ref 0–99)
NONHDL: 125.81
Total CHOL/HDL Ratio: 4
Triglycerides: 153 mg/dL — ABNORMAL HIGH (ref 0.0–149.0)
VLDL: 30.6 mg/dL (ref 0.0–40.0)

## 2016-10-08 LAB — CBC
HCT: 38.7 % (ref 36.0–46.0)
HEMOGLOBIN: 13.2 g/dL (ref 12.0–15.0)
MCHC: 34.1 g/dL (ref 30.0–36.0)
MCV: 82.9 fl (ref 78.0–100.0)
PLATELETS: 331 10*3/uL (ref 150.0–400.0)
RBC: 4.67 Mil/uL (ref 3.87–5.11)
RDW: 14.4 % (ref 11.5–15.5)
WBC: 8.6 10*3/uL (ref 4.0–10.5)

## 2016-10-08 LAB — HEMOGLOBIN A1C: Hgb A1c MFr Bld: 7.4 % — ABNORMAL HIGH (ref 4.6–6.5)

## 2016-10-08 LAB — LIPASE: LIPASE: 63 U/L — AB (ref 11.0–59.0)

## 2016-10-08 MED ORDER — GLUCOSE BLOOD VI STRP: ORAL_STRIP | 0 refills | Status: DC

## 2016-10-08 MED ORDER — ONETOUCH DELICA LANCETS 33G MISC: 1 refills | Status: DC

## 2016-10-08 MED ORDER — METFORMIN HCL 500 MG PO TABS: 500.0000 mg | ORAL_TABLET | Freq: Two times a day (BID) | ORAL | 3 refills | Status: DC

## 2016-10-08 NOTE — Progress Notes (Signed)
Pre visit review using our clinic review tool, if applicable. No additional management support is needed unless otherwise documented below in the visit note. 

## 2016-10-08 NOTE — Patient Instructions (Addendum)
It was nice to see you today!  Good luck with weight loss- work on gradual weight loss with diet and exercise. I will be in touch with your labs and we will see if truclicityis a good idea for you Please have your eye doctor send me their report after your next exam  Influenza (Flu) Vaccine (Inactivated or Recombinant): What You Need to Know 1. Why get vaccinated? Influenza ("flu") is a contagious disease that spreads around the [place] every year, usually between October and May. Flu is caused by influenza viruses, and is spread mainly by coughing, sneezing, and close contact. Anyone can get flu. Flu strikes suddenly and can last several days. Symptoms vary by age, but can include:  fever/chills  sore throat  muscle aches  fatigue  cough  headache  runny or stuffy nose Flu can also lead to pneumonia and blood infections, and cause diarrhea and seizures in children. If you have a medical condition, such as heart or lung disease, flu can make it worse. Flu is more dangerous for some people. Infants and young children, people 80 years of age and older, pregnant women, and people with certain health conditions or a weakened immune system are at greatest risk. Each year thousands of people in the [place] die from flu, and many more are hospitalized. Flu vaccine can:  keep you from getting flu,  make flu less severe if you do get it, and  keep you from spreading flu to your family and other people. 2. Inactivated and recombinant flu vaccines A dose of flu vaccine is recommended every flu season. Children 6 months through 80 years of age may need two doses during the same flu season. Everyone else needs only one dose each flu season. Some inactivated flu vaccines contain a very small amount of a mercury-based preservative called thimerosal. Studies have not shown thimerosal in vaccines to be harmful, but flu vaccines that do not contain thimerosal are available. There  is no live flu virus in flu shots. They cannot cause the flu. There are many flu viruses, and they are always changing. Each year a new flu vaccine is made to protect against three or four viruses that are likely to cause disease in the upcoming flu season. But even when the vaccine doesn't exactly match these viruses, it may still provide some protection. Flu vaccine cannot prevent:  flu that is caused by a virus not covered by the vaccine, or  illnesses that look like flu but are not. It takes about 2 weeks for protection to develop after vaccination, and protection lasts through the flu season. 3. Some people should not get this vaccine Tell the person who is giving you the vaccine:  If you have any severe, life-threatening allergies. If you ever had a life-threatening allergic reaction after a dose of flu vaccine, or have a severe allergy to any part of this vaccine, you may be advised not to get vaccinated. Most, but not all, types of flu vaccine contain a small amount of egg protein.  If you ever had Guillain-Barr Syndrome (also called GBS). Some people with a history of GBS should not get this vaccine. This should be discussed with your doctor.  If you are not feeling well. It is usually okay to get flu vaccine when you have a mild illness, but you might be asked to come back when you feel better. 4. Risks of a vaccine reaction With any medicine, including vaccines, there is  a chance of reactions. These are usually mild and go away on their own, but serious reactions are also possible. Most people who get a flu shot do not have any problems with it. Minor problems following a flu shot include:  soreness, redness, or swelling where the shot was given  hoarseness  sore, red or itchy eyes  cough  fever  aches  headache  itching  fatigue If these problems occur, they usually begin soon after the shot and last 1 or 2 days. More serious problems following a flu shot can  include the following:  There may be a small increased risk of Guillain-Barre Syndrome (GBS) after inactivated flu vaccine. This risk has been estimated at 1 or 2 additional cases per million people vaccinated. This is much lower than the risk of severe complications from flu, which can be prevented by flu vaccine.  Young children who get the flu shot along with pneumococcal vaccine (PCV13) and/or DTaP vaccine at the same time might be slightly more likely to have a seizure caused by fever. Ask your doctor for more information. Tell your doctor if a child who is getting flu vaccine has ever had a seizure. Problems that could happen after any injected vaccine:  People sometimes faint after a medical procedure, including vaccination. Sitting or lying down for about 15 minutes can help prevent fainting, and injuries caused by a fall. Tell your doctor if you feel dizzy, or have vision changes or ringing in the ears.  Some people get severe pain in the shoulder and have difficulty moving the arm where a shot was given. This happens very rarely.  Any medication can cause a severe allergic reaction. Such reactions from a vaccine are very rare, estimated at about 1 in a million doses, and would happen within a few minutes to a few hours after the vaccination. As with any medicine, there is a very remote chance of a vaccine causing a serious injury or death. The safety of vaccines is always being monitored. For more information, visit: http://www.aguilar.org/ 5. What if there is a serious reaction? What should I look for? Look for anything that concerns you, such as signs of a severe allergic reaction, very high fever, or unusual behavior. Signs of a severe allergic reaction can include hives, swelling of the face and throat, difficulty breathing, a fast heartbeat, dizziness, and weakness. These would start a few minutes to a few hours after the vaccination. What should I do?  If you think it is a  severe allergic reaction or other emergency that can't wait, call 9-1-1 and get the person to the nearest hospital. Otherwise, call your doctor.  Reactions should be reported to the Vaccine Adverse Event Reporting System (VAERS). Your doctor should file this report, or you can do it yourself through the VAERS web site at www.vaers.SamedayNews.es, or by calling 615-085-4752.  VAERS does not give medical advice. 6. The National Vaccine Injury Compensation Program The [employer] Vaccine Injury Compensation Program (VICP) is a federal program that was created to compensate people who may have been injured by certain vaccines. Persons who believe they may have been injured by a vaccine can learn about the program and about filing a claim by calling 210 354 2234 or visiting the [place] website at GoldCloset.com.ee. There is a time limit to file a claim for compensation. 7. How can I learn more?  Ask your healthcare provider. He or she can give you the vaccine package insert or suggest other sources of information.  Call your local or state health department.  Contact the Centers for Disease Control and Prevention (CDC):  Call 207 674 2661 (1-800-CDC-INFO) or  Visit CDC's website at https://gibson.com/ Vaccine Information Statement, Inactivated Influenza Vaccine (05/19/2014) This information is not intended to replace advice given to you by your health care provider. Make sure you discuss any questions you have with your health care provider. Document Released: 07/24/2006 Document Revised: 06/19/2016 Document Reviewed: 06/19/2016 Elsevier Interactive Patient Education  2017 [employer].

## 2016-10-08 NOTE — Progress Notes (Addendum)
[facility] at [facility] [address] [place], [place] [address] [phone] [id number]  Date:  10/08/2016   Name:  Angel Dawson   DOB:  1981-03-19   MRN:  [id number]  PCP:  Leeanne Rio, PA-C    Chief Complaint: No chief complaint on file.   History of Present Illness:  Angel Dawson is a 35 y.o. very pleasant female patient who presents with the following:  History of DM, HTN, obesity. Her today for a follow-up visit.  She is on metformin for her DM. She is on 500 mg BID at this time.  She is afraid that her A1c may have gone up as she has not been as good about her diet recently   Lisinopril/ hctz and coreg for her BP She is not fasting today for labs - she ate a glucerna shake at 0600 and a sandwich a little while ago.  She does not check home BP, she does check her home glucose once a week or so.   It has gone up over the last 2 weeks due to her running low on metformin and taking less than her usual dose.     She is interested in perhaps starting trulicty.  She did have pancreatitis about 3 years ago- however this was due to a gallstone and her gallbladder was since removed.  No issues with her pancreas since.   She does not get menses since she had her ablation in February of this year.    She is overdue for an eye exam but plans to do this soon- she will send me the report   She has noted easier bruising for the last 6 months or so.  She is not sure why this is happening to her.  She was dx with Dm about 2 years ago and has noted easier bruising since then.   Noted to have microcytosis about 6 months ago but no anemia.   No nosebleeds, no blood in her stool or urine  She has thought about gastric bypass- however she is not sure if this is for her.  She would like to work on her weight on her own.  She has been to see nutrition/ diabetes classes.      BP Readings from Last 3 Encounters:  10/08/16 120/86  04/25/16 114/72   04/11/16 130/82    Lab Results  Component Value Date   HGBA1C 7.3 (H) 04/25/2016   Wt Readings from Last 3 Encounters:  10/08/16 (!) 429 lb (194.6 kg)  04/25/16 (!) 416 lb 8 oz (188.9 kg)  04/11/16 (!) 429 lb (194.6 kg)     Patient Active Problem List   Diagnosis Date Noted  . Onychomycosis 10/26/2014  . Visit for preventive health examination 10/26/2014  . Snoring 10/04/2014  . Gastroesophageal reflux disease without esophagitis 09/28/2014  . Diabetes mellitus type II, uncontrolled ([place]) 09/28/2014  . Hypertension 08/25/2014  . Morbid obesity ([place]) 08/25/2014  . Chronic rhinitis 06/30/2014  . Pancreatitis, acute 07/02/2013  . Gallstones 04/13/2013    Past Medical History:  Diagnosis Date  . Abdominal pain    related to gallstones  . Achilles tendonitis, bilateral   . Bronchitis    02/17  . Diabetes ([place])   . DKA (diabetic ketoacidoses) ([place]) 08/25/2014  . Gallstones   . GERD (gastroesophageal reflux disease)   . Headache   . Hypertension   . Lattice degeneration, both eyes   . Pancreatitis   .  PCOS (polycystic ovarian syndrome)   . Sinus congestion    occ. uses OTC meds for this    Past Surgical History:  Procedure Laterality Date  . CHOLECYSTECTOMY N/A 06/22/2013   Procedure: LAPAROSCOPIC CHOLECYSTECTOMY ;  Surgeon: Adin Hector, MD;  Location: [facility];  Service: General;  Laterality: N/A;  . DILITATION & CURRETTAGE/HYSTROSCOPY WITH HYDROTHERMAL ABLATION N/A 01/03/2016   Procedure: DILATATION & CURETTAGE/HYSTEROSCOPY WITH HYDROTHERMAL ABLATION;  Surgeon: Crawford Givens, MD;  Location: [facility];  Service: Gynecology;  Laterality: N/A;  HTA rep will be attend confirmed by office 12/12/15   . TONSILLECTOMY      Social History  Substance Use Topics  . Smoking status: Never Smoker  . Smokeless tobacco: Never Used  . Alcohol use 0.0 oz/week     Comment: 1 drink per month    Family History  Problem Relation Age of Onset  . Hypertension Mother   .  Diabetes Mother   . Hypertension Father   . Diabetes Father   . Stroke Paternal Grandfather   . Lung cancer Paternal Grandmother     smoker-heavy  . Asthma Father   . Colon cancer Neg Hx     No Known Allergies  Medication list has been reviewed and updated.  Current Outpatient Prescriptions on File Prior to Visit  Medication Sig Dispense Refill  . albuterol (PROVENTIL HFA;VENTOLIN HFA) 108 (90 Base) MCG/ACT inhaler Inhale 2 puffs into the lungs every 6 (six) hours as needed for wheezing or shortness of breath.    . B Complex Vitamins (VITAMIN B COMPLEX) TABS Take 1 tablet by mouth daily. 1 each 11  . baclofen (LIORESAL) 10 MG tablet Take 1 tablet (10 mg total) by mouth at bedtime. 30 each 0  . Blood Glucose Monitoring Suppl (BLOOD GLUCOSE METER KIT AND SUPPLIES) Dispense based on patient and insurance preference. Use up to four times daily as directed. (FOR ICD-9 250.00, 250.01). 1 each 0  . carvedilol (COREG) 25 MG tablet TAKE 1 TABLET TWICE A DAY WITH MEALS 180 tablet 0  . fluticasone (FLONASE) 50 MCG/ACT nasal spray USE 1 SPRAY IEN QD  0  . glucose blood (ONE TOUCH ULTRA TEST) test strip Check once daily. Dx:E11.9 100 each 0  . ibuprofen (ADVIL,MOTRIN) 200 MG tablet Take 200-300 mg by mouth every 6 (six) hours as needed for headache, mild pain or moderate pain.    Marland Kitchen lisinopril-hydrochlorothiazide (PRINZIDE,ZESTORETIC) 20-25 MG tablet TAKE 1 TABLET DAILY 90 tablet 1  . metFORMIN (GLUCOPHAGE) 500 MG tablet TAKE 1 TABLET DAILY WITH BREAKFAST 90 tablet 0  . NON FORMULARY Nettipot Nasal    . omeprazole (PRILOSEC) 20 MG capsule TAKE 1 CAPSULE DAILY 90 capsule 0  . ONETOUCH DELICA LANCETS 19J MISC Use once daily to check glucose. 100 each 1  . OVER THE COUNTER MEDICATION [place] [employer] Vitamin-Take 2 tablet by mouth daily.     No current facility-administered medications on file prior to visit.     Review of Systems:  As per HPI- otherwise negative.   Physical  Examination:  Blood pressure 120/86, pulse 88, temperature 98.7 F (37.1 C), temperature source Oral, resp. rate 18, height 5' 11"  (1.803 m), weight (!) 429 lb (194.6 kg), SpO2 98 %.  Body mass index is 59.83 kg/m.  GEN: WDWN, NAD, Non-toxic, A & O x 3, very obese/ large build.  Otherwise looks well HEENT: Atraumatic, Normocephalic. Neck supple. No masses, No LAD.  Bilateral TM wnl, oropharynx normal.  PEERL,EOMI.   Ears  and Nose: No external deformity. CV: RRR, No M/G/R. No JVD. No thrill. No extra heart sounds. PULM: CTA B, no wheezes, crackles, rhonchi. No retractions. No resp. distress. No accessory muscle use. EXTR: No c/c/e NEURO Normal gait.  PSYCH: Normally interactive. Conversant. Not depressed or anxious appearing.  Calm demeanor.    Assessment and Plan:  Controlled type 2 diabetes mellitus with other specified complication, without long-term current use of insulin (HCC) - Plan: Hemoglobin A1c, metFORMIN (GLUCOPHAGE) 500 MG tablet, ONETOUCH DELICA LANCETS 05W MISC, glucose blood (ONE TOUCH ULTRA TEST) test strip, Comprehensive metabolic panel  Dyslipidemia - Plan: Lipid panel  Essential hypertension, benign  History of pancreatitis - Plan: Lipase  Morbid obesity (HCC)  Easy bruising - Plan: CBC  Immunization due - Plan: Flu Vaccine QUAD 36+ mos IM  Here today for a follow-up visit She is interested in trulicity- does have a history of gallstone pancreatitis s/p chole.  If her A1c indicates we can likely start trulicity but will look into this further Discussed bariatric surgery- she would really like to try and lose weight herself first.  She plans to see me in 3 months and we have set a goal of 20 lbs lost.  Review CBC due to easy bruising Refilled her metformin and DM supplies  Signed Lamar Blinks, MD  Received her labs- message to pt  Results for orders placed or performed in visit on 10/08/16  Hemoglobin A1c  Result Value Ref Range   Hgb A1c MFr Bld 7.4  (H) 4.6 - 6.5 %  Lipase  Result Value Ref Range   Lipase 63.0 (H) 11.0 - 59.0 U/L  Comprehensive metabolic panel  Result Value Ref Range   Sodium 137 135 - 145 mEq/L   Potassium 3.9 3.5 - 5.1 mEq/L   Chloride 102 96 - 112 mEq/L   CO2 26 19 - 32 mEq/L   Glucose, Bld 199 (H) 70 - 99 mg/dL   BUN 13 6 - 23 mg/dL   Creatinine, Ser 0.87 0.40 - 1.20 mg/dL   Total Bilirubin 0.3 0.2 - 1.2 mg/dL   Alkaline Phosphatase 88 39 - 117 U/L   AST 19 0 - 37 U/L   ALT 21 0 - 35 U/L   Total Protein 7.2 6.0 - 8.3 g/dL   Albumin 4.2 3.5 - 5.2 g/dL   Calcium 10.1 8.4 - 10.5 mg/dL   GFR 94.06 >60.00 mL/min  Lipid panel  Result Value Ref Range   Cholesterol 168 0 - 200 mg/dL   Triglycerides 153.0 (H) 0.0 - 149.0 mg/dL   HDL 41.90 >39.00 mg/dL   VLDL 30.6 0.0 - 40.0 mg/dL   LDL Cholesterol 95 0 - 99 mg/dL   Total CHOL/HDL Ratio 4    NonHDL 125.81   CBC  Result Value Ref Range   WBC 8.6 4.0 - 10.5 K/uL   RBC 4.67 3.87 - 5.11 Mil/uL   Platelets 331.0 150.0 - 400.0 K/uL   Hemoglobin 13.2 12.0 - 15.0 g/dL   HCT 38.7 36.0 - 46.0 %   MCV 82.9 78.0 - 100.0 fl   MCHC 34.1 30.0 - 36.0 g/dL   RDW 14.4 11.5 - 15.5 %

## 2016-10-14 ENCOUNTER — Encounter: Payer: Self-pay | Admitting: Family Medicine

## 2016-11-17 DIAGNOSIS — M722 Plantar fascial fibromatosis: Secondary | ICD-10-CM | POA: Diagnosis not present

## 2016-11-17 DIAGNOSIS — M7662 Achilles tendinitis, left leg: Secondary | ICD-10-CM | POA: Diagnosis not present

## 2016-11-17 DIAGNOSIS — M7661 Achilles tendinitis, right leg: Secondary | ICD-10-CM | POA: Diagnosis not present

## 2016-11-20 DIAGNOSIS — M7662 Achilles tendinitis, left leg: Secondary | ICD-10-CM | POA: Diagnosis not present

## 2016-11-20 DIAGNOSIS — M722 Plantar fascial fibromatosis: Secondary | ICD-10-CM | POA: Diagnosis not present

## 2016-11-20 DIAGNOSIS — M7661 Achilles tendinitis, right leg: Secondary | ICD-10-CM | POA: Diagnosis not present

## 2016-11-24 DIAGNOSIS — M722 Plantar fascial fibromatosis: Secondary | ICD-10-CM | POA: Diagnosis not present

## 2016-11-24 DIAGNOSIS — M7662 Achilles tendinitis, left leg: Secondary | ICD-10-CM | POA: Diagnosis not present

## 2016-11-24 DIAGNOSIS — M7661 Achilles tendinitis, right leg: Secondary | ICD-10-CM | POA: Diagnosis not present

## 2016-11-27 DIAGNOSIS — I499 Cardiac arrhythmia, unspecified: Secondary | ICD-10-CM | POA: Diagnosis not present

## 2016-11-27 DIAGNOSIS — R002 Palpitations: Secondary | ICD-10-CM | POA: Diagnosis not present

## 2016-11-27 DIAGNOSIS — R0789 Other chest pain: Secondary | ICD-10-CM | POA: Diagnosis not present

## 2016-12-07 ENCOUNTER — Other Ambulatory Visit: Payer: Self-pay | Admitting: Physician Assistant

## 2016-12-30 ENCOUNTER — Other Ambulatory Visit: Payer: Self-pay | Admitting: Physician Assistant

## 2017-01-06 NOTE — Progress Notes (Signed)
[place] at [facility] [address], [place], [place] [address] [phone]  Date:  01/07/2017   Name:  Angel Dawson   DOB:  November 05, 1980   MRN:  [id number]  PCP:  Angel Blinks, MD    Chief Complaint: Diabetes (48-monthF/u.States that seh was seen at a [facility] 2-3 months ago for an irregular heartbeat. EKG was performed in mid-February. Was told that nothing out of the ordinary was seen.)   History of Present Illness:  Angel Cranmeris a 36y.o. very pleasant female patient who presents with the following:  Last seen here in December History of obesity, controlled DM, HTN  Checks CBG 1-2 times per week.  CBGs never over 200.  Last checked fasting CBG three weeks and it was 140.  Denies any hypoglycemia.  Stopped eating fried fried foods.  Limiting sodas to 1-2 times per week (can't drink diet sodas because they give her diarrhea).  Denies any formal exercise / physical activity.  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    Last eye exam:  11-15-2014.  Last saw a retina specialist of Angel Dawson Last pap: last year done per OBG UTD on foot exam   Lab Results  Component Value Date   HGBA1C 7.4 (H) 10/08/2016   Wt Readings from Last 3 Encounters:  01/07/17 (!) 426 lb (193.2 kg)  10/08/16 (!) 429 lb (194.6 kg)  04/25/16 (!) 416 lb 8 oz (188.9 kg)    She had lost some more weight earlier this year but gained most of it back, she will continue to work on this  She lives on the 3rd floor and is able to climb these stairs with groceries - no CP with this or other physical activity. She feels reassured by her ER visit earlier this year and normal EKG- at this time she does not wish to pursue any further cardiac evaluation   She normally takes her BP meds in the morning but did not take yet today- this is probably why her BP is a bit high Patient Active Problem List   Diagnosis Date  Noted  . Onychomycosis 10/26/2014  . Visit for preventive health examination 10/26/2014  . Snoring 10/04/2014  . Gastroesophageal reflux disease without esophagitis 09/28/2014  . Diabetes mellitus type II, uncontrolled ([facility] 09/28/2014  . Hypertension 08/25/2014  . Morbid obesity ([facility] 08/25/2014  . Chronic rhinitis 06/30/2014  . Pancreatitis, acute 07/02/2013  . Gallstones 04/13/2013    Past Medical History:  Diagnosis Date  . Abdominal pain    related to gallstones  . Achilles tendonitis, bilateral   . Bronchitis    02/17  . Diabetes ([facility]   . DKA (diabetic ketoacidoses) ([place] 08/25/2014  . Gallstones   . GERD (gastroesophageal reflux disease)   . Headache   . Hypertension   . Lattice degeneration, both eyes   . Pancreatitis   . PCOS (polycystic ovarian syndrome)   . Sinus congestion    occ. uses OTC meds for this    Past Surgical History:  Procedure Laterality Date  . CHOLECYSTECTOMY N/A 06/22/2013   Procedure: LAPAROSCOPIC CHOLECYSTECTOMY ;  Surgeon: HAdin Hector MD;  Location: [facility];  Service: General;  Laterality: N/A;  . DILITATION & CURRETTAGE/HYSTROSCOPY WITH HYDROTHERMAL ABLATION N/A 01/03/2016   Procedure: DILATATION & CURETTAGE/HYSTEROSCOPY WITH HYDROTHERMAL ABLATION;  Surgeon: NCrawford Givens MD;  [facility];  Service: Gynecology;  Laterality: N/A;  HTA rep  will be attend confirmed by office 12/12/15   . TONSILLECTOMY      Social History  Substance Use Topics  . Smoking status: Never Smoker  . Smokeless tobacco: Never Used  . Alcohol use 0.0 oz/week     Comment: 1 drink per month    Family History  Problem Relation Age of Onset  . Hypertension Mother   . Diabetes Mother   . Hypertension Father   . Diabetes Father   . Asthma Father   . Stroke Paternal Grandfather   . Lung cancer Paternal Grandmother     smoker-heavy  . Colon cancer Neg Hx     No Known Allergies  Medication list has been reviewed and updated.  Current Outpatient  Prescriptions on File Prior to Visit  Medication Sig Dispense Refill  . albuterol (PROVENTIL HFA;VENTOLIN HFA) 108 (90 Base) MCG/ACT inhaler Inhale 2 puffs into the lungs every 6 (six) hours as needed for wheezing or shortness of breath.    . Blood Glucose Monitoring Suppl (BLOOD GLUCOSE METER KIT AND SUPPLIES) Dispense based on patient and insurance preference. Use up to four times daily as directed. (FOR ICD-9 250.00, 250.01). 1 each 0  . carvedilol (COREG) 25 MG tablet Take 1 tablet twice a day with meals 180 tablet 3  . fluticasone (FLONASE) 50 MCG/ACT nasal spray USE 1 SPRAY IEN QD  0  . glucose blood (ONE TOUCH ULTRA TEST) test strip Check once daily. Dx:E11.9 100 each 0  . ibuprofen (ADVIL,MOTRIN) 200 MG tablet Take 200-300 mg by mouth every 6 (six) hours as needed for headache, mild pain or moderate pain.    Marland Kitchen lisinopril-hydrochlorothiazide (PRINZIDE,ZESTORETIC) 20-25 MG tablet TAKE 1 TABLET DAILY 90 tablet 1  . metFORMIN (GLUCOPHAGE) 500 MG tablet Take 1 tablet (500 mg total) by mouth 2 (two) times daily with a meal. 180 tablet 3  . omeprazole (PRILOSEC) 20 MG capsule TAKE 1 CAPSULE DAILY (SCHEDULE FOLLOW UP APPOINTMENT) 90 capsule 3  . ONETOUCH DELICA LANCETS 84O MISC Use once daily to check glucose. 100 each 1  . OVER THE COUNTER MEDICATION [employer] Vitamin-Take 2 tablet by mouth daily.     No current facility-administered medications on file prior to visit.     Review of Systems:  As per HPI- otherwise negative. No fever, chills, nausea, vomiting   Physical Examination: Vitals:   01/07/17 0901  BP: (!) 141/98  Pulse: 79  Temp: 98 F (36.7 C)   Vitals:   01/07/17 0901  Weight: (!) 426 lb (193.2 kg)  Height: _0  (1.803 m)   Body mass index is 59.41 kg/m. Ideal Body Weight: Weight in (lb) to have BMI = 25: 178.9  GEN: WDWN, NAD, Non-toxic, A & O x 3, obese, looks well HEENT: Atraumatic, Normocephalic. Neck supple. No masses, No LAD. Ears and Nose: No  external deformity. CV: RRR, No M/G/R. No JVD. No thrill. No extra heart sounds. PULM: CTA B, no wheezes, crackles, rhonchi. No retractions. No resp. distress. No accessory muscle use. ABD: S, NT, ND EXTR: No c/c/e NEURO Normal gait.  PSYCH: Normally interactive. Conversant. Not depressed or anxious appearing.  Calm demeanor.   Results for orders placed or performed in visit on 01/07/17  Hemoglobin A1c  Result Value Ref Range   Hgb A1c MFr Bld 8.0 (H) 4.6 - 6.5 %    Assessment and Plan: Controlled type 2 diabetes mellitus with other specified complication, without long-term current use of insulin ([place]) - Plan: Hemoglobin A1c  Essential hypertension, benign  Morbid obesity (H[facility])  Here today to follow-up on her DM Her A1c unfortunately is worse today- message to pt on mychart.  Will increase her metformin and really encouraged her to work on weight loss  She did not take her BP meds this am so difficult to know how well her BP is controlled   Signed Angel Blinks, MD

## 2017-01-07 ENCOUNTER — Encounter: Payer: Self-pay | Admitting: Family Medicine

## 2017-01-07 ENCOUNTER — Ambulatory Visit (INDEPENDENT_AMBULATORY_CARE_PROVIDER_SITE_OTHER): Payer: BLUE CROSS/BLUE SHIELD | Admitting: Family Medicine

## 2017-01-07 VITALS — BP 135/95 | HR 79 | Temp 98.0°F | Ht 71.0 in | Wt >= 6400 oz

## 2017-01-07 DIAGNOSIS — E1169 Type 2 diabetes mellitus with other specified complication: Secondary | ICD-10-CM | POA: Diagnosis not present

## 2017-01-07 DIAGNOSIS — I10 Essential (primary) hypertension: Secondary | ICD-10-CM

## 2017-01-07 LAB — HEMOGLOBIN A1C: Hgb A1c MFr Bld: 8 % — ABNORMAL HIGH (ref 4.6–6.5)

## 2017-01-07 MED ORDER — METFORMIN HCL 1000 MG PO TABS
1000.0000 mg | ORAL_TABLET | Freq: Two times a day (BID) | ORAL | 3 refills | Status: DC
Start: 2017-01-07 — End: 2017-01-30

## 2017-01-07 NOTE — Patient Instructions (Addendum)
It was nice to see you today- I will be in touch with your A1c.   If you have any concerns about your heart please let me know Continue to work on your weight - 1 lb a week is a good goal. Try using an app on your phone to help with this

## 2017-01-07 NOTE — Progress Notes (Signed)
Pre visit review using our clinic review tool, if applicable. No additional management support is needed unless otherwise documented below in the visit note. 

## 2017-01-30 ENCOUNTER — Telehealth: Payer: Self-pay | Admitting: Family Medicine

## 2017-01-30 ENCOUNTER — Other Ambulatory Visit: Payer: Self-pay

## 2017-01-30 DIAGNOSIS — E1169 Type 2 diabetes mellitus with other specified complication: Secondary | ICD-10-CM

## 2017-01-30 MED ORDER — METFORMIN HCL 1000 MG PO TABS: 1000.0000 mg | ORAL_TABLET | Freq: Two times a day (BID) | ORAL | 3 refills | Status: DC

## 2017-01-30 NOTE — Telephone Encounter (Signed)
I sent in metformin to pt pharmacy/local pharmacy. Will you cancel mail order pharmacy.   Also not sure how she know ketones present. Ketones can occur with dehydration and high sugars. Has she been checking her blood sugars.

## 2017-01-30 NOTE — Telephone Encounter (Signed)
Follow up call made to patient. States she has been out of Metformin because of Problem with insurance Co. WOuld like for Rx to be sent to [facility]. Metformin Rx sent to [facility] per patient request. Has concern regarding her Ketones in urine leaning towards the moderate side. States she will call back on Monday after she has restarted Metformin if Ketones still the same.

## 2017-01-30 NOTE — Telephone Encounter (Signed)
[facility] - Client [facility] Patient Name: Angel Dawson DOB: Jun 20, 1981 Initial Comment Caller states blood sugar has been around 250, fasting this morning was 217, moderate keytones Nurse Assessment Nurse: Dimas Chyle, RN, Dellis Filbert Date/Time (Eastern Time): 01/30/2017 9:41:43 AM Confirm and document reason for call. If symptomatic, describe symptoms. ---Caller states blood sugar has been around 250, fasting this morning was 217, moderate ketones. Elevated for the last week. Metformin 500 mg bid. Dosage recently increased to 1000 mg bid but has not started new dosage yet. Some nausea. Does the patient have any new or worsening symptoms? ---Yes Will a triage be completed? ---Yes Related visit to physician within the last 2 weeks? ---Yes Does the PT have any chronic conditions? (i.e. diabetes, asthma, etc.) ---Yes List chronic conditions. ---HTN and diabetes type 2 Is the patient pregnant or possibly pregnant? (Ask all females between the ages of 62-55) ---No Is this a behavioral health or substance abuse call? ---No Guidelines Guideline Title Affirmed Question Affirmed Notes Diabetes - High Blood Sugar Urine ketones moderate - large Final Disposition User Call PCP Now Dimas Chyle, RN, Dellis Filbert Comments Caller also has questions about Ketogenic diet. Needing new Rx called for increased dosage of metformin to [facility] at [phone] due to issues with mail order pharmacy on file. Referrals REFERRED TO PCP OFFICE Disagree/Comply: Comply

## 2017-02-02 NOTE — Telephone Encounter (Signed)
Have her keep checking her sugars on medicine. She is not my patient. But ketones can go along with high blood sugars and dehydration. Recommend check sugars daily and keep log of readings. Folllow up in 2 weeks with pcp or I could see if nothing available with pcp. How how are her sugars recently?

## 2017-02-03 NOTE — Telephone Encounter (Signed)
Called patient,left detailed message from E. Saguier on her answering machine with phone number to call back with questions.

## 2017-02-09 ENCOUNTER — Other Ambulatory Visit: Payer: Self-pay | Admitting: Physician Assistant

## 2017-03-10 ENCOUNTER — Other Ambulatory Visit: Payer: Self-pay | Admitting: Family Medicine

## 2017-03-10 DIAGNOSIS — E1169 Type 2 diabetes mellitus with other specified complication: Secondary | ICD-10-CM

## 2017-03-18 ENCOUNTER — Encounter: Payer: Self-pay | Admitting: Family Medicine

## 2017-03-18 ENCOUNTER — Telehealth: Payer: Self-pay | Admitting: Family Medicine

## 2017-03-18 DIAGNOSIS — IMO0002 Reserved for concepts with insufficient information to code with codable children: Secondary | ICD-10-CM

## 2017-03-18 DIAGNOSIS — E1165 Type 2 diabetes mellitus with hyperglycemia: Secondary | ICD-10-CM

## 2017-03-18 DIAGNOSIS — G479 Sleep disorder, unspecified: Secondary | ICD-10-CM

## 2017-03-18 DIAGNOSIS — E1140 Type 2 diabetes mellitus with diabetic neuropathy, unspecified: Secondary | ICD-10-CM

## 2017-03-18 NOTE — Telephone Encounter (Signed)
ok 

## 2017-03-18 NOTE — Telephone Encounter (Signed)
Please see below.  Thank you.

## 2017-03-18 NOTE — Telephone Encounter (Signed)
Last seen Angel Dawson 06/07/2015 and would like to see someone else.  She was not happy with him suggesting that she should get gastric bypass and wants to try to stick with medications over surgeries.  Please call to discuss.  Thank you,  -LL

## 2017-03-18 NOTE — Telephone Encounter (Signed)
Patient would like to see a diff provider at your office

## 2017-03-18 NOTE — Telephone Encounter (Signed)
Pt wants to switch to another MD please advise

## 2017-04-06 DIAGNOSIS — H35413 Lattice degeneration of retina, bilateral: Secondary | ICD-10-CM | POA: Diagnosis not present

## 2017-04-06 DIAGNOSIS — H33321 Round hole, right eye: Secondary | ICD-10-CM | POA: Diagnosis not present

## 2017-04-07 NOTE — Progress Notes (Signed)
[place] at [facility] [address], [place], [place] [address] [phone]  Date:  04/08/2017   Name:  Angel Dawson   DOB:  11-21-1980   MRN:  [id number]  PCP:  Darreld Mclean, MD    Chief Complaint: Follow-up (Pt here for f/u visit. Metformin was increased but caused diarrhea so pt had to decrease back to 500 mg. )   History of Present Illness:  Angel Dawson is a 36 y.o. very pleasant female patient who presents with the following:  Here today for a 3 month follow-up visit History of severe obesity, DM, HTN She was dx with DM in 2015, always did ok on metformin 500 BID until her last A1c as below Lab Results  Component Value Date   HGBA1C 8.0 (H) 01/07/2017   After her last A1c we tried to increase her metformin to 1000 BID, but this led to GI problems.  We have referred her to endocrinology but no appt yet She is back on 500 mg twice a day- she is tolerating this ok She does have an endocrinology appt coming up in July  Since this past November she has noted that her blood sugars are "all over the place" and are not as easy to control as they were in the past.  Knows that she needs to lose weight but admits that she has never really tried to do so.  She is interested in the [facility] wellness center.   Her right great toenail has been persistently infected with apparent fungal infection.  She has done 2 rounds of lamisil but it has not cleared up  She does go to [place] for her paps- Dr. Elray Mcgregor   BP is under reasonable control  BP Readings from Last 3 Encounters:  04/08/17 118/90  01/07/17 (!) 135/95  10/08/16 120/86     Wt Readings from Last 3 Encounters:  04/08/17 (!) 415 lb (188.2 kg)  01/07/17 (!) 426 lb (193.2 kg)  10/08/16 (!) 429 lb (194.6 kg)    Patient Active Problem List   Diagnosis Date Noted  . Onychomycosis 10/26/2014  . Visit for preventive health examination 10/26/2014  .  Snoring 10/04/2014  . Gastroesophageal reflux disease without esophagitis 09/28/2014  . Diabetes mellitus type II, uncontrolled ([place]) 09/28/2014  . Hypertension 08/25/2014  . Morbid obesity ([place]) 08/25/2014  . Chronic rhinitis 06/30/2014  . Pancreatitis, acute 07/02/2013  . Gallstones 04/13/2013    Past Medical History:  Diagnosis Date  . Abdominal pain    related to gallstones  . Achilles tendonitis, bilateral   . Bronchitis    02/17  . Diabetes ([place])   . DKA (diabetic ketoacidoses) ([place]) 08/25/2014  . Gallstones   . GERD (gastroesophageal reflux disease)   . Headache   . Hypertension   . Lattice degeneration, both eyes   . Pancreatitis   . PCOS (polycystic ovarian syndrome)   . Sinus congestion    occ. uses OTC meds for this    Past Surgical History:  Procedure Laterality Date  . CHOLECYSTECTOMY N/A 06/22/2013   Procedure: LAPAROSCOPIC CHOLECYSTECTOMY ;  Surgeon: Adin Hector, MD;  Location: [facility];  Service: General;  Laterality: N/A;  . DILITATION & CURRETTAGE/HYSTROSCOPY WITH HYDROTHERMAL ABLATION N/A 01/03/2016   Procedure: DILATATION & CURETTAGE/HYSTEROSCOPY WITH HYDROTHERMAL ABLATION;  Surgeon: Crawford Givens, MD;  Location: [facility];  Service: Gynecology;  Laterality: N/A;  HTA rep will be attend confirmed by office  12/12/15   . TONSILLECTOMY      Social History  Substance Use Topics  . Smoking status: Never Smoker  . Smokeless tobacco: Never Used  . Alcohol use 0.0 oz/week     Comment: 1 drink per month    Family History  Problem Relation Age of Onset  . Hypertension Mother   . Diabetes Mother   . Hypertension Father   . Diabetes Father   . Asthma Father   . Stroke Paternal Grandfather   . Lung cancer Paternal Grandmother        smoker-heavy  . Colon cancer Neg Hx     No Known Allergies  Medication list has been reviewed and updated.  Current Outpatient Prescriptions on File Prior to Visit  Medication Sig Dispense Refill  . Blood Glucose  Monitoring Suppl (BLOOD GLUCOSE METER KIT AND SUPPLIES) Dispense based on patient and insurance preference. Use up to four times daily as directed. (FOR ICD-9 250.00, 250.01). 1 each 0  . carvedilol (COREG) 25 MG tablet Take 1 tablet twice a day with meals 180 tablet 3  . fluticasone (FLONASE) 50 MCG/ACT nasal spray USE 1 SPRAY IEN QD  0  . ibuprofen (ADVIL,MOTRIN) 200 MG tablet Take 200-300 mg by mouth every 6 (six) hours as needed for headache, mild pain or moderate pain.    Marland Kitchen lisinopril-hydrochlorothiazide (PRINZIDE,ZESTORETIC) 20-25 MG tablet TAKE 1 TABLET DAILY 90 tablet 3  . metFORMIN (GLUCOPHAGE) 1000 MG tablet Take 1 tablet (1,000 mg total) by mouth 2 (two) times daily with a meal. 180 tablet 3  . omeprazole (PRILOSEC) 20 MG capsule TAKE 1 CAPSULE DAILY (SCHEDULE FOLLOW UP APPOINTMENT) 90 capsule 3  . ONE TOUCH ULTRA TEST test strip TEST ONCE DAILY 100 each 0  . ONETOUCH DELICA LANCETS 84O MISC Use once daily to check glucose. 100 each 1  . OVER THE COUNTER MEDICATION [employer] Vitamin-Take 2 tablet by mouth daily.     No current facility-administered medications on file prior to visit.     Review of Systems:  As per HPI- otherwise negative.   Physical Examination: Vitals:   04/08/17 1001  BP: 118/90  Pulse: 82  Temp: 98.5 F (36.9 C)   Vitals:   04/08/17 1001  Weight: (!) 415 lb (188.2 kg)  Height: 5' 11"  (1.803 m)   Body mass index is 57.88 kg/m. Ideal Body Weight: Weight in (lb) to have BMI = 25: 178.9  GEN: WDWN, NAD, Non-toxic, A & O x 3, very obese but otherwise looks well HEENT: Atraumatic, Normocephalic. Neck supple. No masses, No LAD. Ears and Nose: No external deformity. CV: RRR, No M/G/R. No JVD. No thrill. No extra heart sounds. PULM: CTA B, no wheezes, crackles, rhonchi. No retractions. No resp. distress. No accessory muscle use. ABD: S, NT, ND, +BS. No rebound. No HSM. EXTR: No c/c/e NEURO Normal gait.  PSYCH: Normally interactive.  Conversant. Not depressed or anxious appearing.  Calm demeanor. Right great toenail with crumbling, thickening and white debris under the nail    Assessment and Plan: Uncontrolled type 2 diabetes mellitus with diabetic neuropathy, without long-term current use of insulin ([place]) - Plan: Hemoglobin N6E, Basic metabolic panel  Essential hypertension, benign  Morbid obesity ([place]) - Plan: Basic metabolic panel  Onychomycosis of right great toe - Plan: Ambulatory referral to Podiatry  Here today to recheck her labs, A1c and BMP If A1c still too high will consider adding a 2nd medication encouraged her to make a serious effort towards  weight loss Referral to podiatry- may be best to remove the troublesome nail and start fresh  Will plan further follow- up pending labs.   Signed Lamar Blinks, MD

## 2017-04-08 ENCOUNTER — Ambulatory Visit (INDEPENDENT_AMBULATORY_CARE_PROVIDER_SITE_OTHER): Payer: BLUE CROSS/BLUE SHIELD | Admitting: Family Medicine

## 2017-04-08 VITALS — BP 118/90 | HR 82 | Temp 98.5°F | Ht 71.0 in | Wt >= 6400 oz

## 2017-04-08 DIAGNOSIS — E1165 Type 2 diabetes mellitus with hyperglycemia: Secondary | ICD-10-CM

## 2017-04-08 DIAGNOSIS — E1140 Type 2 diabetes mellitus with diabetic neuropathy, unspecified: Secondary | ICD-10-CM | POA: Diagnosis not present

## 2017-04-08 DIAGNOSIS — I10 Essential (primary) hypertension: Secondary | ICD-10-CM

## 2017-04-08 DIAGNOSIS — B351 Tinea unguium: Secondary | ICD-10-CM

## 2017-04-08 DIAGNOSIS — IMO0002 Reserved for concepts with insufficient information to code with codable children: Secondary | ICD-10-CM

## 2017-04-08 LAB — BASIC METABOLIC PANEL
BUN: 10 mg/dL (ref 6–23)
CALCIUM: 10.1 mg/dL (ref 8.4–10.5)
CHLORIDE: 103 meq/L (ref 96–112)
CO2: 28 mEq/L (ref 19–32)
CREATININE: 0.82 mg/dL (ref 0.40–1.20)
GFR: 100.44 mL/min (ref >60.00)
Glucose, Bld: 139 mg/dL — ABNORMAL HIGH (ref 70–99)
Potassium: 3.7 mEq/L (ref 3.5–5.1)
Sodium: 139 mEq/L (ref 135–145)

## 2017-04-08 LAB — HEMOGLOBIN A1C: Hgb A1c MFr Bld: 7.1 % — ABNORMAL HIGH (ref 4.6–6.5)

## 2017-04-08 NOTE — Patient Instructions (Signed)
It was good to see you today- I will be in touch with your labs asap.  We can consider adding a medication if needed to control your sugars

## 2017-04-14 ENCOUNTER — Ambulatory Visit: Payer: BLUE CROSS/BLUE SHIELD | Admitting: Endocrinology

## 2017-04-22 ENCOUNTER — Other Ambulatory Visit: Payer: Self-pay | Admitting: Physician Assistant

## 2017-04-28 ENCOUNTER — Institutional Professional Consult (permissible substitution): Payer: BLUE CROSS/BLUE SHIELD | Admitting: Neurology

## 2017-05-05 ENCOUNTER — Encounter: Payer: Self-pay | Admitting: Podiatry

## 2017-05-05 ENCOUNTER — Ambulatory Visit (INDEPENDENT_AMBULATORY_CARE_PROVIDER_SITE_OTHER): Payer: BLUE CROSS/BLUE SHIELD | Admitting: Podiatry

## 2017-05-05 DIAGNOSIS — M7662 Achilles tendinitis, left leg: Secondary | ICD-10-CM

## 2017-05-05 DIAGNOSIS — L603 Nail dystrophy: Secondary | ICD-10-CM

## 2017-05-05 DIAGNOSIS — M7732 Calcaneal spur, left foot: Secondary | ICD-10-CM | POA: Diagnosis not present

## 2017-05-05 DIAGNOSIS — L814 Other melanin hyperpigmentation: Secondary | ICD-10-CM | POA: Diagnosis not present

## 2017-05-05 DIAGNOSIS — B351 Tinea unguium: Secondary | ICD-10-CM | POA: Diagnosis not present

## 2017-05-05 MED ORDER — MELOXICAM 15 MG PO TABS: 15.0000 mg | ORAL_TABLET | Freq: Every day | ORAL | 2 refills | Status: AC

## 2017-05-05 NOTE — Patient Instructions (Signed)
Onychomycosis/Fungal Toenails  WHAT IS IT? An infection that lies within the keratin of your nail plate that is caused by a fungus.  WHY ME? Fungal infections affect all ages, sexes, races, and creeds.  There may be many factors that predispose you to a fungal infection such as age, coexisting medical conditions such as diabetes, or an autoimmune disease; stress, medications, fatigue, genetics, etc.  Bottom line: fungus thrives in a warm, moist environment and your shoes offer such a location.  IS IT CONTAGIOUS? Theoretically, yes.  You do not want to share shoes, nail clippers or files with someone who has fungal toenails.  Walking around barefoot in the same room or sleeping in the same bed is unlikely to transfer the organism.  It is important to realize, however, that fungus can spread easily from one nail to the next on the same foot.  HOW DO WE TREAT THIS?  There are several ways to treat this condition.  Treatment may depend on many factors such as age, medications, pregnancy, liver and kidney conditions, etc.  It is best to ask your doctor which options are available to you.  1. No treatment.   Unlike many other medical concerns, you can live with this condition.  However for many people this can be a painful condition and may lead to ingrown toenails or a bacterial infection.  It is recommended that you keep the nails cut short to help reduce the amount of fungal nail. 2. Topical treatment.  These range from herbal remedies to prescription strength nail lacquers.  About 40-50% effective, topicals require twice daily application for approximately 9 to 12 months or until an entirely new nail has grown out.  The most effective topicals are medical grade medications available through physicians offices. 3. Oral antifungal medications.  With an 80-90% cure rate, the most common oral medication requires 3 to 4 months of therapy and stays in your system for a year as the new nail grows out.  Oral  antifungal medications do require blood work to make sure it is a safe drug for you.  A liver function panel will be performed prior to starting the medication and after the first month of treatment.  It is important to have the blood work performed to avoid any harmful side effects.  In general, this medication safe but blood work is required. 4. Laser Therapy.  This treatment is performed by applying a specialized laser to the affected nail plate.  This therapy is noninvasive, fast, and non-painful.  It is not covered by insurance and is therefore, out of pocket.  The results have been very good with a 80-95% cure rate.  The [facility] is the only practice in the area to offer this therapy. 5. Permanent Nail Avulsion.  Removing the entire nail so that a new nail will not grow back.  Achilles Tendinitis Rehab Ask your health care provider which exercises are safe for you. Do exercises exactly as told by your health care provider and adjust them as directed. It is normal to feel mild stretching, pulling, tightness, or discomfort as you do these exercises, but you should stop right away if you feel sudden pain or your pain gets worse. Do not begin these exercises until told by your health care provider. Stretching and range of motion exercises These exercises warm up your muscles and joints and improve the movement and flexibility of your ankle. These exercises also help to relieve pain, numbness, and tingling. Exercise A: Standing  wall calf stretch, knee straight  6. Stand with your hands against a wall. 7. Extend your __________ leg behind you and bend your front knee slightly. Keep both of your heels on the floor. 8. Point the toes of your back foot slightly inward. 9. Keeping your heels on the floor and your back knee straight, shift your weight toward the wall. Do not allow your back to arch. You should feel a gentle stretch in your calf. 10. Hold this position for seconds. Repeat  __________ times. Complete this stretch __________ times per day. Exercise B: Standing wall calf stretch, knee bent 1. Stand with your hands against a wall. 2. Extend your __________ leg behind you, and bend your front knee slightly. Keep both of your heels on the floor. 3. Point the toes of your back foot slightly inward. 4. Keeping your heels on the floor, unlock your back knee so that it is bent. You should feel a gentle stretch deep in your calf. 5. Hold this position for __________ seconds. Repeat __________ times. Complete this stretch __________ times per day. Strengthening exercises These exercises build strength and control of your ankle. Endurance is the ability to use your muscles for a long time, even after they get tired. Exercise C: Plantar flexion with band  1. Sit on the floor with your __________ leg extended. You may put a pillow under your calf to give your foot more room to move. 2. Loop a rubber exercise band or tube around the ball of your __________ foot. The ball of your foot is on the walking surface, right under your toes. The band or tube should be slightly tense when your foot is relaxed. If the band or tube slips, you can put on your shoe or put a washcloth between the band and your foot to help it stay in place. 3. Slowly point your toes downward, pushing them away from you. 4. Hold this position for __________ seconds. 5. Slowly release the tension in the band or tube, controlling smoothly until your foot is back to the starting position. Repeat __________ times. Complete this exercise __________ times per day. Exercise D: Heel raise with eccentric lower  1. Stand on a step with the balls of your feet. The ball of your foot is on the walking surface, right under your toes. ? Do not put your heels on the step. ? For balance, rest your hands on the wall or on a railing. 2. Rise up onto the balls of your feet. 3. Keeping your heels up, shift all of your weight to  your __________ leg and pick up your other leg. 4. Slowly lower your __________ leg so your heel drops below the level of the step. 5. Put down your foot. If told by your health care provider, build up to:  3 sets of 15 repetitions while keeping your knees straight.  3 sets of 15 repetitions while keeping your knees bent as far as told by your health care provider.  Complete this exercise __________ times per day. If this exercise is too easy, try doing it while wearing a backpack with weights in it. Balance exercises These exercises improve or maintain your balance. Balance is important in preventing falls. Exercise E: Single leg stand 1. Without shoes, stand near a railing or in a door frame. Hold on to the railing or door frame as needed. 2. Stand on your __________ foot. Keep your big toe down on the floor and try to keep your arch  lifted. 3. Hold this position for __________ seconds. Repeat __________ times. Complete this exercise __________ times per day. If this exercise is too easy, you can try it with your eyes closed or while standing on a pillow. This information is not intended to replace advice given to you by your health care provider. Make sure you discuss any questions you have with your health care provider. Document Released: 04/30/2005 Document Revised: 06/05/2016 Document Reviewed: 06/05/2015 Elsevier Interactive Patient Education  [employer].

## 2017-05-05 NOTE — Progress Notes (Signed)
Subjective:    Patient ID: Angel Dawson, female    DOB: February 10, 1981, 36 y.o.   MRN: [id number]  HPI  36 year old female presents the office they for concerns of toenail fungus to her right big toe which is been ongoing for about 15 years. She states that she presented on 2 rounds of Lamisil which helped quite a bit however it never completely resolved. She has not been on Lamisil about 6 years. She states that almost clear except for a small amount and then when she   stop the medication started spread back and toward toenail. She states that the nail does get painful at times a thickened elongated but generally does not hurt. She denies any redness or drainage or any swelling to the area.  Also while this appointment she states that she has had Achilles tendinitis the left side which is been ongoing for 15 years as well as been intermittent. She states that she's been actively and she had an injury and since then she just chronic gets this reoccurring pain to the area. She does stretching she has night splint which does help some but her symptoms do continue. She denies any recent injury or trauma. She has some mild discomfort today.  She is diabetic and she states her blood sugar runs between 110 to 140. She denies any numbness or tingling into denies any claudication symptoms.   Review of Systems  All other systems reviewed and are negative.      Objective:   Physical Exam General: AAO x3, NAD; obese  Dermatological: Right hallux toenail is hypertrophic, dystrophic, discolored. There is no pain in the nail looseness any redness or drainage. There is yellow-brown discoloration the toenail. No open lesions are identified.  Vascular: Dorsalis Pedis artery and Posterior Tibial artery pedal pulses are 2/4 bilateral with immedate capillary fill time. Pedal hair growth present. No varicosities and no lower extremity edema present bilateral. There is no pain with calf compression, swelling,  warmth, erythema.   Neruologic: Grossly intact via light touch bilateral. Vibratory intact via tuning fork bilateral.  Patellar and Achilles deep tendon reflexes 2+ bilateral. No Babinski or clonus noted bilateral.   Musculoskeletal: There is mild tenderness palpation of the posterior aspect the left calcaneus on the insertion of the Achilles tendon. There is no pain on the substance of the Achilles tendon and Thompson test is negative. There is no overlying edema, erythema, increase in warmth. Achilles tendon intact. Small posterior calcaneal spurring is palpable along the area of tenderness mostly on the posterior lateral aspect of the heel. No pain with lateral compression of the calcaneus and no pain on the plantar fascia. No other area of tenderness identified at this time.  Equinus is present. Muscular strength 5/5 in all groups tested bilateral.  Gait: Unassisted, Nonantalgic.      Assessment & Plan:   36 year old female with right hallux onychodystrophy likely onychomycosis, left posterior calcaneal pain due to insertional Achilles tendinitis/heel spur   1. Right hallux onychomycosis  -At this time I debrided the nail was sent for cultures/pathology to [facility] labs given she's had 2 rounds of Lamisil without any significant long-term improvement. Discussed likely another round of Lamisil or other medication pending the culture. Culture given to Cranford Mon, CMA.    2. Left Achilles tendinitis/posterior heel spur  -She has been stretching and she has a night splint at home.  -Recommend ice to the area daily.  -Prescribed mobic. Discussed side effects of the medication and  directed to stop if any are to occur and call the office.  -Discussed shoe gear modifications and orthotics  -Discussed other options with her including  MRI, EPAT, PT. She wishes to hold off on this for now.   RTC 4 weeks or sooner if needed.  Celesta Gentile, DPM

## 2017-05-06 ENCOUNTER — Ambulatory Visit (INDEPENDENT_AMBULATORY_CARE_PROVIDER_SITE_OTHER): Payer: BLUE CROSS/BLUE SHIELD | Admitting: Endocrinology

## 2017-05-06 ENCOUNTER — Encounter: Payer: Self-pay | Admitting: Endocrinology

## 2017-05-06 VITALS — BP 136/84 | HR 81 | Ht 71.0 in | Wt >= 6400 oz

## 2017-05-06 DIAGNOSIS — E1165 Type 2 diabetes mellitus with hyperglycemia: Secondary | ICD-10-CM | POA: Diagnosis not present

## 2017-05-06 DIAGNOSIS — E049 Nontoxic goiter, unspecified: Secondary | ICD-10-CM | POA: Diagnosis not present

## 2017-05-06 MED ORDER — CANAGLIFLOZIN 100 MG PO TABS: ORAL_TABLET | ORAL | 3 refills | Status: DC

## 2017-05-06 NOTE — Patient Instructions (Addendum)
Check blood sugars on waking up   Also check blood sugars about 2 hours after a meal and do this after different meals by rotation  Recommended blood sugar levels on waking up is 90-130 and about 2 hours after meal is 130-160  Please bring your blood sugar monitor to each visit, thank you  Water exercises

## 2017-05-06 NOTE — Progress Notes (Signed)
Patient ID: Angel Dawson, female   DOB: [birth year], 36 y.o.   MRN: [id number]          Reason for Appointment: Consultation for Type 2 Diabetes  Referring physician: [name] [name]   History of Present Illness:          Date of diagnosis of type 2 diabetes mellitus:  2015      Background history:   She had extreme thirst and urination and was admitted to the hospital with blood sugars over 400 but no overt acidosis However she was discharged only on metformin 500 mg twice a day which she has continued Prior to that in 2014 she appears to have had mild hyperglycemia with blood sugars up to 195  Recent history:    Non-insulin hypoglycemic drugs the patient is taking are: Metformin 500 mg twice a day  Current management, blood sugar patterns and problems identified:  She tends to have relatively high fasting blood sugars, as high as 201  Blood sugars later in the day are generally better although checking mostly before eating  She was told to increase her metformin to 1000 mg twice a day in March but she got diarrhea with this and is back to 500 mg  She thinks she is getting back on her carbohydrates but is not able to lose weight, has not seen a dietitian in almost 3 years    Currently not able to exercise much because of back and lower extremity pain  She is concerned about being on medications for diabetes        Side effects from medications have been: Diarrhea from high dose metformin  Glucose monitoring:  done 1 times a day         Glucometer: One Touch.       Blood Glucose readings by time of day and averages from meter download:  PREMEAL Breakfast Lunch Dinner Bedtime  Overall   Glucose range: 146-201       Median: 162 126    149    POST-MEAL PC Breakfast PC Lunch PC Dinner  Glucose range:  140, 156  115, 128   Median:      Self-care: The diet that the patient has been following is: tries to limit carbs.               Dietician visit, most recent: 2015,  classes               Exercise: None   Weight history:  Wt Readings from Last 3 Encounters:  05/06/17 (!) 419 lb 6.4 oz (190.2 kg)  04/08/17 (!) 415 lb (188.2 kg)  01/07/17 (!) 426 lb (193.2 kg)    Glycemic control:   Lab Results  Component Value Date   HGBA1C 7.1 (H) 04/08/2017   HGBA1C 8.0 (H) 01/07/2017   HGBA1C 7.4 (H) 10/08/2016   Lab Results  Component Value Date   LDLCALC 95 10/08/2016   CREATININE 0.82 04/08/2017   No results found for: MICRALBCREAT  No results found for: FRUCTOSAMINE    Allergies as of 05/06/2017   No Known Allergies     Medication List       Accurate as of 05/06/17 10:23 AM. Always use your most recent med list.          Apple Cider Vinegar 188 MG Caps Take 1 capsule by mouth daily.   aspirin-acetaminophen-caffeine 250-250-65 MG tablet Commonly known as:  EXCEDRIN MIGRAINE Take 2 tablets by mouth as needed for headache.  BAYER BACK & BODY 500-32.5 MG Tabs Generic drug:  Aspirin-Caffeine Take 2 tablets by mouth as needed.   blood glucose meter kit and supplies Dispense based on patient and insurance preference. Use up to four times daily as directed. (FOR ICD-9 250.00, 250.01).   canagliflozin 100 MG Tabs tablet Commonly known as:  INVOKANA 1 tablet before breakfast   carvedilol 25 MG tablet Commonly known as:  COREG Take 1 tablet twice a day with meals   fluticasone 50 MCG/ACT nasal spray Commonly known as:  FLONASE USE 1 SPRAY IN EACH NOSTRIL EVERY DAY   lisinopril-hydrochlorothiazide 20-25 MG tablet Commonly known as:  PRINZIDE,ZESTORETIC TAKE 1 TABLET DAILY   meloxicam 15 MG tablet Commonly known as:  MOBIC Take 1 tablet (15 mg total) by mouth daily.   metFORMIN 500 MG tablet Commonly known as:  GLUCOPHAGE Take 500 mg by mouth 2 (two) times daily with a meal.   omeprazole 20 MG capsule Commonly known as:  PRILOSEC TAKE 1 CAPSULE DAILY (SCHEDULE FOLLOW UP APPOINTMENT)   ONE TOUCH ULTRA TEST test  strip Generic drug:  glucose blood TEST ONCE DAILY   ONETOUCH DELICA LANCETS 37D Misc Use once daily to check glucose.   OVER THE COUNTER MEDICATION One a Day Women's Vitamin-Take 1 tablet by mouth daily.   PROBIOTIC-10 PO Take 1 capsule by mouth daily. Takes Fortify Women's Digestive Probiotic       Allergies: No Known Allergies  Past Medical History:  Diagnosis Date  . Abdominal pain    related to gallstones  . Achilles tendonitis, bilateral   . Bronchitis    02/17  . Diabetes ([place])   . DKA (diabetic ketoacidoses) ([place]) 08/25/2014  . Gallstones   . GERD (gastroesophageal reflux disease)   . Headache   . Hypertension   . Lattice degeneration, both eyes   . Pancreatitis   . PCOS (polycystic ovarian syndrome)   . Sinus congestion    occ. uses OTC meds for this    Past Surgical History:  Procedure Laterality Date  . CHOLECYSTECTOMY N/A 06/22/2013   Procedure: LAPAROSCOPIC CHOLECYSTECTOMY ;  Surgeon: Adin Hector, MD;  Location: [facility];  Service: General;  Laterality: N/A;  . DILITATION & CURRETTAGE/HYSTROSCOPY WITH HYDROTHERMAL ABLATION N/A 01/03/2016   Procedure: DILATATION & CURETTAGE/HYSTEROSCOPY WITH HYDROTHERMAL ABLATION;  Surgeon: Crawford Givens, MD;  Location: [facility];  Service: Gynecology;  Laterality: N/A;  HTA rep will be attend confirmed by office 12/12/15   . TONSILLECTOMY      Family History  Problem Relation Age of Onset  . Hypertension Mother   . Diabetes Mother   . Goiter Mother   . Hypertension Father   . Diabetes Father   . Asthma Father   . Stroke Paternal Grandfather   . Lung cancer Paternal Grandmother        smoker-heavy  . Diabetes Maternal Aunt   . Colon cancer Neg Hx   . Heart disease Neg Hx     Social History:  reports that she has never smoked. She has never used smokeless tobacco. She reports that she drinks alcohol. She reports that she does not use drugs.   Review of Systems  Constitutional: Negative for weight loss and  malaise.  Endocrine: Negative for polydipsia.       Has minimal menstrual bleeding, previously had endometrial ablation for fibroids, no previous history of oligomenorrhea  Genitourinary: Negative for dysuria.  Musculoskeletal: Positive for back pain.       Achilles tendinitis  Skin:       Facial hair present for about 4 years, she is needing to shave or remove this regularly  Neurological: Negative for numbness and tingling.     Lipid history: Has never been on medications    Lab Results  Component Value Date   CHOL 168 10/08/2016   HDL 41.90 10/08/2016   [place] 95 10/08/2016   LDLDIRECT 87.5 09/22/2014   TRIG 153.0 (H) 10/08/2016   CHOLHDL 4 10/08/2016           Hypertension: Present for the last 10 years, monitored by PCP  BP Readings from Last 3 Encounters:  05/06/17 136/84  04/08/17 118/90  01/07/17 (!) 135/95     Most recent eye exam was 6/18, followed by a retina specialist but no diabetic retinopathy present  Most recent foot exam: 7/18    LABS:  No visits with results within 1 Week(s) from this visit.  Latest known visit with results is:  Office Visit on 04/08/2017  Component Date Value Ref Range Status  . Hgb A1c MFr Bld 04/08/2017 7.1* 4.6 - 6.5 % Final   Glycemic Control Guidelines for People with Diabetes:Non Diabetic:  <6%Goal of Therapy: <7%Additional Action Suggested:  >8%   . Sodium 04/08/2017 139  135 - 145 mEq/L Final  . Potassium 04/08/2017 3.7  3.5 - 5.1 mEq/L Final  . Chloride 04/08/2017 103  96 - 112 mEq/L Final  . CO2 04/08/2017 28  19 - 32 mEq/L Final  . Glucose, Bld 04/08/2017 139* 70 - 99 mg/dL Final  . BUN 04/08/2017 10  6 - 23 mg/dL Final  . Creatinine, Ser 04/08/2017 0.82  0.40 - 1.20 mg/dL Final  . Calcium 04/08/2017 10.1  8.4 - 10.5 mg/dL Final  . GFR 04/08/2017 100.44  >60.00 mL/min Final    Physical Examination:  BP 136/84   Pulse 81   Ht 5' 11"  (1.803 m)   Wt (!) 419 lb 6.4 oz (190.2 kg)   SpO2 98%   BMI 58.49 kg/m    GENERAL:         Patient has marked generalized obesity.   HEENT:         Eye exam shows normal external appearance. Fundus exam deferred to ophthalmologist . Oral exam shows normal mucosa .  NECK:   There is no lymphadenopathy  She has moderate acanthosis of the neck  Thyroid is just palpable on the right side, smooth, firm.  Left side  not enlarged and no nodules felt.  LUNGS:         Chest is symmetrical. Lungs are clear to auscultation.Marland Kitchen   HEART:         Heart sounds:  S1 and S2 are normal. No murmur or click heard., no S3 or S4.   ABDOMEN:   There is no distention present. Liver and spleen are not palpable. No other mass or tenderness present.   NEUROLOGICAL:   Ankle jerks are 1+ bilaterally.    Diabetic Foot Exam - Simple   Simple Foot Form Diabetic Foot exam was performed with the following findings:  Yes   Visual Inspection No deformities, no ulcerations, no other skin breakdown bilaterally:  Yes Sensation Testing Intact to touch and monofilament testing bilaterally:  Yes Pulse Check Posterior Tibialis and Dorsalis pulse intact bilaterally:  Yes Comments            Vibration sense is Mildly reduced in distal first toes. MUSCULOSKELETAL:  There is no swelling or deformity of the  peripheral joints.  EXTREMITIES:     There is no edema. No skin lesions present.Marland Kitchen SKIN:       No rash .        ASSESSMENT:  Diabetes type 2, with BMI 58   She has had only fair control with A1c still over 7% taking metformin only 500 mg twice a day Has difficulty losing weight and currently not exercising She does try to watch her carbohydrates but is not following any medically meal planning Has tendency to fasting hyperglycemia with blood sugars as high as 200 She apparently had diarrhea with 1000 mg metformin and is not on Therapeutic doses currently  Discussed with the patient that main goal of her treatment would be to lose weight and currently she has not been able to achieve  this Although she wants to come off her medications discussed that this is only possible if she loses a significant amount of weight  Complications of diabetes: None evident, need urine microalbumin  ?  Goiter with family history of goiter also, needs evaluation of TSH  HYPERTENSION: Has only fair control with current regimen  PLAN:     She agrees to start water aerobics for exercise  Follow-up with dietitian for detailed meal planning  Start checking more readings after meals instead of before eating Trial of Invokana 100 mg daily  Discussed action of SGLT 2 drugs on lowering glucose by decreasing kidney absorption of glucose, benefits of weight loss and lower blood pressure, possible side effects including candidiasis and dosage regimen   Check urine microalbumin on the next visit  Follow-up in 6 weeks and will recheck her labs with fructosamine and BMP  Consider reducing her lisinopril if blood pressure starts getting low normal  Patient Instructions  Check blood sugars on waking up   Also check blood sugars about 2 hours after a meal and do this after different meals by rotation  Recommended blood sugar levels on waking up is 90-130 and about 2 hours after meal is 130-160  Please bring your blood sugar monitor to each visit, thank you  Water exercises    Consultation note has been sent to the referring physician  Total visit time for detailed evaluation and management, review of history and labs, counseling = 40 minutes  Angel Dawson 05/06/2017, 10:23 AM   Note: This office note was prepared with Dragon voice recognition system technology. Any transcriptional errors that result from this process are unintentional.

## 2017-05-06 NOTE — Addendum Note (Signed)
Addended by: Cranford Mon R on: 05/06/2017 02:33 PM   Modules accepted: Orders

## 2017-05-15 ENCOUNTER — Encounter: Payer: Self-pay | Admitting: Endocrinology

## 2017-05-15 NOTE — Telephone Encounter (Signed)
Patient called in reference to the note in mychart about getting her metformin 500mg  sent to the phramacy listed in the message in [place]. Patient needs a 1 month supply due to not returning to the [place] until 05/24/17. Call patient to advise.

## 2017-05-15 NOTE — Telephone Encounter (Signed)
Patient called in reference to note below. Please call patient and advise.  OK to leave message.

## 2017-05-16 ENCOUNTER — Other Ambulatory Visit: Payer: Self-pay | Admitting: Endocrinology

## 2017-05-16 MED ORDER — METFORMIN HCL ER 500 MG PO TB24: 1500.0000 mg | ORAL_TABLET | Freq: Every day | ORAL | 3 refills | Status: DC

## 2017-05-20 ENCOUNTER — Telehealth: Payer: Self-pay | Admitting: Podiatry

## 2017-05-20 NOTE — Telephone Encounter (Signed)
Left vm for pt to call to schedule appt. For treatment options

## 2017-05-26 ENCOUNTER — Encounter: Payer: Self-pay | Admitting: Neurology

## 2017-05-26 ENCOUNTER — Ambulatory Visit (INDEPENDENT_AMBULATORY_CARE_PROVIDER_SITE_OTHER): Payer: BLUE CROSS/BLUE SHIELD | Admitting: Neurology

## 2017-05-26 VITALS — BP 153/99 | HR 83 | Ht 71.0 in | Wt >= 6400 oz

## 2017-05-26 DIAGNOSIS — G4710 Hypersomnia, unspecified: Secondary | ICD-10-CM

## 2017-05-26 DIAGNOSIS — R400 Somnolence: Secondary | ICD-10-CM | POA: Diagnosis not present

## 2017-05-26 DIAGNOSIS — G478 Other sleep disorders: Secondary | ICD-10-CM

## 2017-05-26 DIAGNOSIS — F518 Other sleep disorders not due to a substance or known physiological condition: Secondary | ICD-10-CM

## 2017-05-26 DIAGNOSIS — Z6841 Body Mass Index (BMI) 40.0 and over, adult: Secondary | ICD-10-CM | POA: Diagnosis not present

## 2017-05-26 DIAGNOSIS — R0683 Snoring: Secondary | ICD-10-CM

## 2017-05-26 DIAGNOSIS — R6889 Other general symptoms and signs: Secondary | ICD-10-CM

## 2017-05-26 NOTE — Progress Notes (Signed)
Subjective:    Patient ID: Angel Dawson is a 36 y.o. female.  HPI     Angel Age, MD, PhD [facility] [address] [place], [place] [address]  Dear Dr. Lorelei Pont,   I saw your patient, Angel Dawson, upon your kind request in my neurologic clinic today for initial consultation of her sleep disorder, in particular, reevaluation of her OSA. The patient is unaccompanied today. As you know, Angel Dawson is a 36 year old right-handed woman with an underlying medical history of diabetes, history of diabetic ketoacidosis, history of bronchitis last year, reflux disease, hypertension, recurrent headache, PCO S, history of pancreatitis, and morbid obesity, who had a home sleep test about 2-1/2 years ago which showed borderline obstructive sleep apnea with an AHI of 4.4 per hour, average oxygen saturation of 94%, nadir of 83%. Her BMI was 57.6 at the time, weight was 413 pounds. Her current weight is about the same, 412 lb. I reviewed your office note from 04/08/2017. She also reports having had an actual attended sleep study some 10 years ago with inconclusive findings as she was not able to sleep very much. This was done at [facility], she was not able to sleep on her back at the time and remembers that she did not sleep and off. She does sleep mostly on her sides and sometimes on her stomach. She is able to sleep on her back son. She lives alone, she is single, no children, works at [employer] and has a long commute. She has to drive for about an hour and she lives in [place]. She gets sleepy at the wheel. This has concerned her. She has recently shared a bedroom with a girlfriend of hers and has specifically asked her about snoring and she does not snore very much, makes quiet noises sometimes like a sigh. She has vivid dreams. She dreams during naps as well. She has extended naps sometimes on weekends, has slept for hours at a time. She has experienced intermittent  sleep paralysis for years, dating back 20 years even. She recalls being sleepy as a child even. She had her tonsils out when she was 24 and had multiple strep throat episodes with time. She is trying to lose weight and make some lifestyle changes to help her diabetes and her weight. She has given up caffeine. She drinks alcohol very occasionally, maybe once a month or so. She's a nonsmoker. Bedtime is around 10 PM and wakeup time typically without an alarm is around 5 or 5:15 AM but by 11 AM she becomes really sleepy and often will try to nap during her lunch hour. She has to be at work at 26. She leaves the house around 5:45 AM. She has had intermittent restless leg symptoms. She has had leg cramps at night as well. She has woken up with a headache particularly after an extended nap, not so much in the morning, denies nocturia. Mom has sleep apnea, dad is also sleepy during the day but has not been formally diagnosed with any sleep condition. She reports that no matter how long she sleeps she does not wake up rested, she feels that her sleep is poor quality, has woken herself up with a sense of gasping for air but has not been witnessed to have apneas. Epworth sleepiness score is 19 out of 24 today, fatigue score is 58 out of 63. She denies symptoms of depression.  Her Past Medical History Is Significant For: Past Medical  History:  Diagnosis Date  . Abdominal pain    related to gallstones  . Achilles tendonitis, bilateral   . Bronchitis    02/17  . Diabetes ([place])   . DKA (diabetic ketoacidoses) ([place]) 08/25/2014  . Gallstones   . GERD (gastroesophageal reflux disease)   . Headache   . Hypertension   . Lattice degeneration, both eyes   . Pancreatitis   . PCOS (polycystic ovarian syndrome)   . Sinus congestion    occ. uses OTC meds for this    Her Past Surgical History Is Significant For: Past Surgical History:  Procedure Laterality Date  . CHOLECYSTECTOMY N/A 06/22/2013   Procedure:  LAPAROSCOPIC CHOLECYSTECTOMY ;  Surgeon: Adin Hector, MD;  Location: [facility];  Service: General;  Laterality: N/A;  . DILITATION & CURRETTAGE/HYSTROSCOPY WITH HYDROTHERMAL ABLATION N/A 01/03/2016   Procedure: DILATATION & CURETTAGE/HYSTEROSCOPY WITH HYDROTHERMAL ABLATION;  Surgeon: Crawford Givens, MD;  Location: [facility];  Service: Gynecology;  Laterality: N/A;  HTA rep will be attend confirmed by office 12/12/15   . TONSILLECTOMY      Her Family History Is Significant For: Family History  Problem Relation Dawson of Onset  . Hypertension Mother   . Diabetes Mother   . Goiter Mother   . Hypertension Father   . Diabetes Father   . Asthma Father   . Stroke Paternal Grandfather   . Lung cancer Paternal Grandmother        smoker-heavy  . Diabetes Maternal Aunt   . Colon cancer Neg Hx   . Heart disease Neg Hx     Her Social History Is Significant For: Social History   Social History  . Marital status: Single    Spouse name: N/A  . Number of children: 0  . Years of education: N/A   Occupational History  . research/scientist [employer]   Social History Main Topics  . Smoking status: Never Smoker  . Smokeless tobacco: Never Used  . Alcohol use 0.0 oz/week     Comment: 1 drink per month  . Drug use: No  . Sexual activity: No   Other Topics Concern  . None   Social History Narrative  . None    Her Allergies Are:  No Known Allergies:   Her Current Medications Are:  Outpatient Encounter Prescriptions as of 05/26/2017  Medication Sig  . aspirin-acetaminophen-caffeine (EXCEDRIN MIGRAINE) 250-250-65 MG tablet Take 2 tablets by mouth as needed for headache.  . Aspirin-Caffeine (BAYER BACK & BODY) 500-32.5 MG TABS Take 2 tablets by mouth as needed.  . Blood Glucose Monitoring Suppl (BLOOD GLUCOSE METER KIT AND SUPPLIES) Dispense based on patient and insurance preference. Use up to four times daily as directed. (FOR ICD-9 250.00, 250.01).  . canagliflozin (INVOKANA) 100  MG TABS tablet 1 tablet before breakfast  . carvedilol (COREG) 25 MG tablet Take 1 tablet twice a day with meals  . fluticasone (FLONASE) 50 MCG/ACT nasal spray USE 1 SPRAY IN EACH NOSTRIL EVERY DAY  . lisinopril-hydrochlorothiazide (PRINZIDE,ZESTORETIC) 20-25 MG tablet TAKE 1 TABLET DAILY  . meloxicam (MOBIC) 15 MG tablet Take 1 tablet (15 mg total) by mouth daily.  . metFORMIN (GLUCOPHAGE) 500 MG tablet Take 500 mg by mouth 2 (two) times daily with a meal.  . omeprazole (PRILOSEC) 20 MG capsule TAKE 1 CAPSULE DAILY (SCHEDULE FOLLOW UP APPOINTMENT)  . ONE TOUCH ULTRA TEST test strip TEST ONCE DAILY  . ONETOUCH DELICA LANCETS 45T MISC Use once daily to check glucose.  Marland Kitchen  OVER THE COUNTER MEDICATION One a Day Women's Vitamin-Take 1 tablet by mouth daily.  . Probiotic Product (PROBIOTIC-10 PO) Take 1 capsule by mouth daily. Takes Fortify Women's Digestive Probiotic  . [DISCONTINUED] Apple Cider Vinegar 188 MG CAPS Take 1 capsule by mouth daily.  . [DISCONTINUED] metFORMIN (GLUCOPHAGE-XR) 500 MG 24 hr tablet Take 3 tablets (1,500 mg total) by mouth daily with supper.   No facility-administered encounter medications on file as of 05/26/2017.   :  Review of Systems:  Out of a complete 14 point review of systems, all are reviewed and negative with the exception of these symptoms as listed below: Review of Systems  Neurological:       Pt presents today to discuss her sleep. Pt had an HST in 2016 but never has used a cpap. Pt does endorse snoring.  Epworth Sleepiness Scale 0= would never doze 1= slight chance of dozing 2= moderate chance of dozing 3= high chance of dozing  Sitting and reading: 3 Watching TV: 3 Sitting inactive in a public place (ex. Theater or meeting): 1 As a passenger in a car for an hour without a break: 3 Lying down to rest in the afternoon: 3 Sitting and talking to someone: 2 Sitting quietly after lunch (no alcohol): 1 In a car, while stopped in traffic: 3 Total: 19      Objective:  Neurological Exam  Physical Exam Physical Examination:   Vitals:   05/26/17 1552  BP: (!) 153/99  Pulse: 83    General Examination: The patient is a very pleasant 36 y.o. female in no acute distress. She appears well-developed and well-nourished and well groomed.   HEENT: Normocephalic, atraumatic, pupils are equal, round and reactive to light and accommodation. Extraocular tracking is good without limitation to gaze excursion or nystagmus noted. Normal smooth pursuit is noted. Hearing is grossly intact. Face is symmetric with normal facial animation and normal facial sensation. Speech is clear with no dysarthria noted. There is no hypophonia. There is no lip, neck/head, jaw or voice tremor. Neck is supple with full range of passive and active motion. There are no carotid bruits on auscultation. Oropharynx exam reveals: mild mouth dryness, good dental hygiene and moderate airway crowding secondary to larger tongue, smaller airway entry and larger appearing uvula. Tonsils are absent. Mallampati is class III. Neck circumference is 18-1/2 inches. She has a mild overbite. Tongue protrudes centrally and palate elevates symmetrically.   Chest: Clear to auscultation without wheezing, rhonchi or crackles noted.  Heart: S1+S2+0, regular and normal without murmurs, rubs or gallops noted.   Abdomen: Soft, non-tender and non-distended with normal bowel sounds appreciated on auscultation.  Extremities: There is no pitting edema in the distal lower extremities bilaterally. Pedal pulses are intact.  Skin: Warm and dry without trophic changes noted.  Musculoskeletal: exam reveals no obvious joint deformities, tenderness or joint swelling or erythema.   Neurologically:  Mental status: The patient is awake, alert and oriented in all 4 spheres. Her immediate and remote memory, attention, language skills and fund of knowledge are appropriate. There is no evidence of aphasia, agnosia,  apraxia or anomia. Speech is clear with normal prosody and enunciation. Thought process is linear. Mood is normal and affect is normal.  Cranial nerves II - XII are as described above under HEENT exam. In addition: shoulder shrug is normal with equal shoulder height noted. Motor exam: Normal bulk, strength and tone is noted. There is no drift, tremor or rebound. Romberg is negative. Reflexes are  1+ throughout. Fine motor skills and coordination: intact with normal finger taps, normal hand movements, normal rapid alternating patting, normal foot taps and normal foot agility.  Cerebellar testing: No dysmetria or intention tremor on finger to nose testing. Heel to shin is unremarkable bilaterally. There is no truncal or gait ataxia.  Sensory exam: intact to light touch in the upper and lower extremities.  Gait, station and balance: She stands easily. No veering to one side is noted. No leaning to one side is noted. Posture is Dawson-appropriate and stance is narrow based. Gait shows normal stride length and normal pace. No problems turning are noted. Tandem walk is unremarkable.   Assessment and plan:  In summary, Kjersti Dittmer is a very pleasant 36 y.o.-year old female with an underlying medical history of diabetes, history of diabetic ketoacidosis, history of bronchitis last year, reflux disease, hypertension, recurrent headache, PCOS, history of pancreatitis, and morbid obesity, whose history and physical exam are somewhat concerning for obstructive sleep apnea (OSA), but I think there may be more to her sleepiness than underlying OSA concern. She reports a longer standing history of sleepiness, had a negative sleep study in the distant past although she reports that she did not sleep enough for a conclusive test results and she had borderline findings on sleep test. I think a sleepiness disorder such as idiopathic hypersomnolence or narcolepsy without cataplexy are not off the table. We need to proceed with  sleep study testing to help tease out her sleep disorder. I had a long chat with the patient about my findings and the diagnosis of OSA and possible hypersomnolence conditions. I explained in particular the risks and ramifications of untreated moderate to severe OSA, especially with respect to developing cardiovascular disease down the Road, including congestive heart failure, difficult to treat hypertension, cardiac arrhythmias, or stroke. Even type 2 diabetes has, in part, been linked to untreated OSA. Symptoms of untreated OSA include daytime sleepiness, memory problems, mood irritability and mood disorder such as depression and anxiety, lack of energy, as well as recurrent headaches, especially morning headaches. We talked about trying to maintain a healthy lifestyle in general, as well as the importance of weight control. I encouraged the patient to eat healthy, exercise daily and keep well hydrated, to keep a scheduled bedtime and wake time routine, to not skip any meals and eat healthy snacks in between meals. I advised the patient not to drive when feeling sleepy. I recommended the following at this time: sleep study overnight, followed by next day nap testing (MSLT).   I explained the sleep test procedures to the patient and also outlined possible surgical and non-surgical treatment options of OSA, including the CPAP treatment option to the patient, who indicated that she would be willing to try CPAP if the need arises. I will see her back after sleep study testing is completed. I answered all her questions today and she was in agreement with the plan.  Thank you very much for allowing me to participate in the care of this nice patient. If I can be of any further assistance to you please do not hesitate to call me at [phone].  Sincerely,   Angel Age, MD, PhD

## 2017-05-26 NOTE — Patient Instructions (Addendum)
Based on your symptoms and your exam I believe you are at risk for obstructive sleep apnea or OSA, and I think we should proceed with a sleep study to determine whether you do or do not have OSA and how severe it is. If you have more than mild OSA, I want you to consider treatment with CPAP. Please remember, the risks and ramifications of moderate to severe obstructive sleep apnea or OSA are: Cardiovascular disease, including congestive heart failure, stroke, difficult to control hypertension, arrhythmias, and even type 2 diabetes has been linked to untreated OSA. Sleep apnea causes disruption of sleep and sleep deprivation in most cases, which, in turn, can cause recurrent headaches, problems with memory, mood, concentration, focus, and vigilance. Most people with untreated sleep apnea report excessive daytime sleepiness, which can affect their ability to drive. Please do not drive if you feel sleepy.   However, based on your sleep related history, you actually may have an underlying sleepiness condition in addition to obstructive sleep apnea concerns. This means, that you have a sleep disorder that manifests with excessive sleep and excessive sleepiness during the day.  We will do more extended sleep testing at this point. We will do nap study testing the next day after your night time sleep study: 5 scheduled 20 min nap opportunities, every 2 hours. We will remind you to stay awake in between naps.   As explained, you will have to be off of any caffeine or stimulant or antidepressant medication in preparation for the sleep studies.   I will see you back after your sleep study to go over the test results and where to go from there. We will call you after your sleep study to advise about the results (most likely, you will hear from Ramsey, my nurse) and to set up an appointment at the time, as necessary.    Our sleep lab administrative assistant, Arrie Aran will meet with you or call you to schedule your sleep  study. If you don't hear back from her by next week please feel free to call her at [phone]. This is her direct line and please leave a message with your phone number to call back if you get the voicemail box. She will call back as soon as possible.

## 2017-06-09 ENCOUNTER — Ambulatory Visit: Payer: BLUE CROSS/BLUE SHIELD | Admitting: Podiatry

## 2017-06-12 ENCOUNTER — Encounter: Payer: Self-pay | Admitting: Dietician

## 2017-06-12 ENCOUNTER — Other Ambulatory Visit (INDEPENDENT_AMBULATORY_CARE_PROVIDER_SITE_OTHER): Payer: BLUE CROSS/BLUE SHIELD

## 2017-06-12 ENCOUNTER — Encounter: Payer: BLUE CROSS/BLUE SHIELD | Attending: Endocrinology | Admitting: Dietician

## 2017-06-12 DIAGNOSIS — E1165 Type 2 diabetes mellitus with hyperglycemia: Secondary | ICD-10-CM

## 2017-06-12 DIAGNOSIS — Z713 Dietary counseling and surveillance: Secondary | ICD-10-CM | POA: Diagnosis not present

## 2017-06-12 DIAGNOSIS — E049 Nontoxic goiter, unspecified: Secondary | ICD-10-CM

## 2017-06-12 DIAGNOSIS — IMO0001 Reserved for inherently not codable concepts without codable children: Secondary | ICD-10-CM

## 2017-06-12 LAB — MICROALBUMIN / CREATININE URINE RATIO
Creatinine,U: 67 mg/dL
Microalb Creat Ratio: 1 mg/g (ref 0.0–30.0)
Microalb, Ur: <0.7 mg/dL (ref 0.0–1.9)

## 2017-06-12 LAB — URINALYSIS, ROUTINE W REFLEX MICROSCOPIC
Bilirubin Urine: NEGATIVE
HGB URINE DIPSTICK: NEGATIVE
KETONES UR: NEGATIVE
LEUKOCYTES UA: NEGATIVE
NITRITE: NEGATIVE
RBC / HPF: NONE SEEN (ref 0–?)
Specific Gravity, Urine: <=1.005 — AB (ref 1.000–1.030)
Total Protein, Urine: NEGATIVE
Urobilinogen, UA: 0.2 (ref 0.0–1.0)
WBC, UA: NONE SEEN (ref 0–?)
pH: 5.5 (ref 5.0–8.0)

## 2017-06-12 LAB — BASIC METABOLIC PANEL
BUN: 11 mg/dL (ref 6–23)
CALCIUM: 10.5 mg/dL (ref 8.4–10.5)
CO2: 27 meq/L (ref 19–32)
Chloride: 102 mEq/L (ref 96–112)
Creatinine, Ser: 0.85 mg/dL (ref 0.40–1.20)
GFR: 96.26 mL/min (ref >60.00)
GLUCOSE: 102 mg/dL — AB (ref 70–99)
POTASSIUM: 3.8 meq/L (ref 3.5–5.1)
Sodium: 137 mEq/L (ref 135–145)

## 2017-06-12 LAB — TSH: TSH: 1.22 u[IU]/mL (ref 0.35–4.50)

## 2017-06-12 NOTE — Patient Instructions (Signed)
Aim to stay active.  What do you enjoy and when can you plan time for this? Vitamin D 1000-2000 units daily. Eat more plants Consider adding carbohydrates (whole grains, legumes, berries, etc.) Small amounts added oils. Continue meal prepping Great job on the changes that you have made.  If you decide trying plant based eating, consider reading    Alyssa Grove, MD "21 day kickstart"  [place], [place] "Vegan for Her"

## 2017-06-12 NOTE — Progress Notes (Signed)
Diabetes Self-Management Education  Visit Type: (P) Follow-up  Appt. Start Time: 1045 Appt. End Time: 1215  06/12/2017  [name], identified by name and date of birth, is a 36 y.o. female with a diagnosis of Diabetes:  Marland Kitchen Type 2 diabetes.  Other hx includs OSA which is eing evaluated, PCOS, GERD, HTN, and history of pancreatitis.  She states her goal is to get off her medications and manage the diabetes with diet and exercise.   Medications include:  Invokanana, Glucophage. Sleep about 7 hours and naps during lunch.   She if currently following a fairly low carbohydrate diet. She states that she has no problems with how she looks but wants to feel better.   She is unable to run/walk a lot due to achilles tendonitis. Her A1C was 7.1% 03/2017 decreased from 8.2%.  It was 10.1% with DKA on diagnosis. Her morning CBG is elevated and her post meal CBG's are generally less than 130.  Weight hx: 412 lbs today 419 lbs 05/06/17 426 lbs 12/2016 She has been losing weight slowly with intentional changes to choices and portions.  She has been meal prepping more.   Highest adult weight 434 lbs  Lowest adult weight 315 lbs She lost weight in the past a couple of times with intense exercise and gained this back when she did not have time to exercise.   346 lbs in 2014 after participating in a competition.  Patient lives alone.  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  She commutes daily from [place].  She tries to meal prep.  Discussed benefits of increased plant based eating.  ASSESSMENT  Height 5\' 11"  (1.803 m), weight (!) 412 lb (186.9 kg). Body mass index is 57.46 kg/m.      Diabetes Self-Management Education - 06/12/17 1126      Visit Information   Visit Type (P)  Follow-up     Health Coping   How would you rate your overall health? (P)  Good     Psychosocial Assessment   Patient Belief/Attitude about Diabetes (P)  Motivated to manage diabetes     Dietary Intake   Breakfast (P)  egg muffins, vegetables, salmon or other leftovers from dinner  6   Snack (morning) (P)  occasional kind bar  9   Lunch (P)  Protein and 3 vegetables from home OR salad from sheets OR [employer] unwich  12   Snack (afternoon) (P)  protein water and sugar snap peas  3:30   Dinner (P)  Salmon, corn on the cob, roasted vegetables  6   Snack (evening) (P)  pickled okra, cucumbers   Beverage(s) (P)  1 gallon water per day, unsweetened tea with splenda     Exercise   Exercise Type (P)  Light (walking / raking leaves)   How many days per week to you exercise? (P)  7   How many minutes per day do you exercise? (P)  10   Total minutes per week of exercise (P)  70     Patient Education   Previous Diabetes Education (P)  Yes (please comment)  DM classes 2015 when diagnosed ([facility])     Subsequent Visit   Since your last visit have you continued or begun to take your medications as prescribed? (P)  Yes   Since your last visit have you had your blood pressure checked? (P)  Yes   Is your most recent blood pressure lower, unchanged, or higher since your last visit? (  P)  Unchanged   Since your last visit have you experienced any weight changes? (P)  Loss   Weight Loss (lbs) (P)  5   Since your last visit, are you checking your blood glucose at least once a day? (P)  Yes      Individualized Plan for Diabetes Self-Management Training:   Learning Objective:  Patient will have a greater understanding of diabetes self-management. Patient education plan is to attend individual and/or group sessions per assessed needs and concerns.   Plan:   Patient Instructions  Aim to stay active.  What do you enjoy and when can you plan time for this? Vitamin D 1000-2000 units daily. Eat more plants Consider adding carbohydrates (whole grains, legumes, berries, etc.) Small amounts added oils. Continue meal prepping Great job on the changes that you have made.  If you decide trying plant  based eating, consider reading    Angel Grove, MD "21 day kickstart"  [place], [place] "Vegan for Her"    Expected Outcomes:   Patient demonstrated interest in learning.  Good outcome expected  Education material provided: Meal plan card, PCOS supplement list, Diabetes [employer] magazine and Diabetes Self-Management magazine.  If problems or questions, patient to contact team via:  Phone  Future DSME appointment:  prn

## 2017-06-13 LAB — FRUCTOSAMINE: FRUCTOSAMINE: 258 umol/L (ref 0–285)

## 2017-06-17 ENCOUNTER — Ambulatory Visit (INDEPENDENT_AMBULATORY_CARE_PROVIDER_SITE_OTHER): Payer: BLUE CROSS/BLUE SHIELD | Admitting: Endocrinology

## 2017-06-17 ENCOUNTER — Encounter: Payer: Self-pay | Admitting: Endocrinology

## 2017-06-17 VITALS — BP 146/94 | HR 93 | Ht 71.0 in | Wt >= 6400 oz

## 2017-06-17 DIAGNOSIS — E1165 Type 2 diabetes mellitus with hyperglycemia: Secondary | ICD-10-CM

## 2017-06-17 DIAGNOSIS — I10 Essential (primary) hypertension: Secondary | ICD-10-CM | POA: Diagnosis not present

## 2017-06-17 MED ORDER — CANAGLIFLOZIN 300 MG PO TABS: 300.0000 mg | ORAL_TABLET | Freq: Every day | ORAL | 3 refills | Status: DC

## 2017-06-17 NOTE — Patient Instructions (Signed)
Take 2 Invokana in am then next Rx will be 300mg   Check blood sugars on waking up  2-3x per week  Also check blood sugars about 2 hours after a meal and do this after different meals by rotation  Recommended blood sugar levels on waking up is 90-130 and about 2 hours after meal is 130-160  Please bring your blood sugar monitor to each visit, thank you

## 2017-06-17 NOTE — Progress Notes (Signed)
Patient ID: Angel Dawson, female   DOB: 1981-07-27, 36 y.o.   MRN: [id number]          Reason for Appointment: Follow-up Type 2 Diabetes  Referring physician: [name] [name]   History of Present Illness:          Date of diagnosis of type 2 diabetes mellitus:  2015      Background history:   She had extreme thirst and urination and was admitted to the hospital with blood sugars over 400 but no overt acidosis However she was discharged only on metformin 500 mg twice a day which she has continued Prior to that in 2014 she appears to have had mild hyperglycemia with blood sugars up to 195  Recent history:    Non-insulin hypoglycemic drugs the patient is taking are: Metformin 500 mg twice a day, Invokana 100 mg daily  Her last A1c was 7.1% in June Fructosamine is 258  Current management, blood sugar patterns and problems identified:  She tends to have relatively high fasting blood sugars and for this reason she was started on Invokana in addition to her low dose metformin on her initial consultation in July  Previously had not tolerated higher doses of metformin  Although she has lost only 2 pounds she appears to have better blood sugars  Her recent fasting blood sugar was 121 but she does not check blood sugars consistently  Recently her blood sugars are mostly fairly good with only one relatively high reading of 181 after evening meal  She has only recently started a little walking, limited by her ankle pain  Although she was severely restricting carbohydrates and getting unbalanced meals her diet is better after seeing the dietitian last week        Side effects from medications have been: Diarrhea from high dose metformin  Glucose monitoring:  done 1 times a day         Glucometer: One Touch.    Mean values apply above for all meters except median for One Touch  PRE-MEAL Fasting Lunch Dinner Bedtime Overall  Glucose range: 121-168  107, 113  104, 131  127-143      Mean/median: 143     132   POST-MEAL PC Breakfast PC Lunch PC Dinner  Glucose range: 155, 175   111-181   Mean/median:   133     Self-care: The diet that the patient has been following is: tries to limit carbs.   Breakfast 6 am:May have protein shake              Dietician visit, most recent: 05/2017, previously in classes               Exercise: now walking some  Weight history:  Wt Readings from Last 3 Encounters:  06/17/17 (!) 410 lb (186 kg)  06/12/17 (!) 412 lb (186.9 kg)  05/26/17 (!) 412 lb (186.9 kg)    Glycemic control:   Lab Results  Component Value Date   HGBA1C 7.1 (H) 04/08/2017   HGBA1C 8.0 (H) 01/07/2017   HGBA1C 7.4 (H) 10/08/2016   Lab Results  Component Value Date   MICROALBUR <0.7 06/12/2017   [place] 95 10/08/2016   CREATININE 0.85 06/12/2017   Lab Results  Component Value Date   MICRALBCREAT 1.0 06/12/2017    Lab Results  Component Value Date   FRUCTOSAMINE 258 06/12/2017      Allergies as of 06/17/2017   No Known Allergies     Medication List  Accurate as of 06/17/17  9:22 AM. Always use your most recent med list.          aspirin-acetaminophen-caffeine 250-250-65 MG tablet Commonly known as:  EXCEDRIN MIGRAINE Take 2 tablets by mouth as needed for headache.   BAYER BACK & BODY 500-32.5 MG Tabs Generic drug:  Aspirin-Caffeine Take 2 tablets by mouth as needed.   blood glucose meter kit and supplies Dispense based on patient and insurance preference. Use up to four times daily as directed. (FOR ICD-9 250.00, 250.01).   canagliflozin 100 MG Tabs tablet Commonly known as:  INVOKANA 1 tablet before breakfast   carvedilol 25 MG tablet Commonly known as:  COREG Take 1 tablet twice a day with meals   fluticasone 50 MCG/ACT nasal spray Commonly known as:  FLONASE USE 1 SPRAY IN EACH NOSTRIL EVERY DAY   lisinopril-hydrochlorothiazide 20-25 MG tablet Commonly known as:  PRINZIDE,ZESTORETIC TAKE 1 TABLET DAILY    meloxicam 15 MG tablet Commonly known as:  MOBIC Take 1 tablet (15 mg total) by mouth daily.   metFORMIN 500 MG tablet Commonly known as:  GLUCOPHAGE Take 500 mg by mouth 2 (two) times daily with a meal.   omeprazole 20 MG capsule Commonly known as:  PRILOSEC TAKE 1 CAPSULE DAILY (SCHEDULE FOLLOW UP APPOINTMENT)   ONE TOUCH ULTRA TEST test strip Generic drug:  glucose blood TEST ONCE DAILY   ONETOUCH DELICA LANCETS 46F Misc Use once daily to check glucose.   OVER THE COUNTER MEDICATION One a Day Women's Vitamin-Take 1 tablet by mouth daily.   PROBIOTIC-10 PO Take 1 capsule by mouth daily. Takes Fortify Women's Digestive Probiotic       Allergies: No Known Allergies  Past Medical History:  Diagnosis Date  . Abdominal pain    related to gallstones  . Achilles tendonitis, bilateral   . Bronchitis    02/17  . Diabetes ([place])   . DKA (diabetic ketoacidoses) ([place]) 08/25/2014  . Gallstones   . GERD (gastroesophageal reflux disease)   . Headache   . Hypertension   . Lattice degeneration, both eyes   . Pancreatitis   . PCOS (polycystic ovarian syndrome)   . Sinus congestion    occ. uses OTC meds for this    Past Surgical History:  Procedure Laterality Date  . CHOLECYSTECTOMY N/A 06/22/2013   Procedure: LAPAROSCOPIC CHOLECYSTECTOMY ;  Surgeon: Adin Hector, MD;  Location: [facility];  Service: General;  Laterality: N/A;  . DILITATION & CURRETTAGE/HYSTROSCOPY WITH HYDROTHERMAL ABLATION N/A 01/03/2016   Procedure: DILATATION & CURETTAGE/HYSTEROSCOPY WITH HYDROTHERMAL ABLATION;  Surgeon: Crawford Givens, MD;  Location: [facility];  Service: Gynecology;  Laterality: N/A;  HTA rep will be attend confirmed by office 12/12/15   . TONSILLECTOMY      Family History  Problem Relation Age of Onset  . Hypertension Mother   . Diabetes Mother   . Goiter Mother   . Hypertension Father   . Diabetes Father   . Asthma Father   . Stroke Paternal Grandfather   . Lung cancer Paternal  Grandmother        smoker-heavy  . Diabetes Maternal Aunt   . Colon cancer Neg Hx   . Heart disease Neg Hx     Social History:  reports that she has never smoked. She has never used smokeless tobacco. She reports that she drinks alcohol. She reports that she does not use drugs.   Review of Systems   Lipid history: Has never been on medications  Lab Results  Component Value Date   CHOL 168 10/08/2016   HDL 41.90 10/08/2016   LDLCALC 95 10/08/2016   LDLDIRECT 87.5 09/22/2014   TRIG 153.0 (H) 10/08/2016   CHOLHDL 4 10/08/2016           Hypertension: Present for the last 10 years, monitored by PCP She has been taking meloxicam 15 mg daily for her and co-pay Also only recently started cutting back on sodium  BP Readings from Last 3 Encounters:  06/17/17 (!) 146/94  05/26/17 (!) 153/99  05/06/17 136/84     Most recent eye exam was 6/18, followed by a retina specialist but no diabetic retinopathy present  Most recent foot exam: 7/18    LABS:  Lab on 06/12/2017  Component Date Value Ref Range Status  . Sodium 06/12/2017 137  135 - 145 mEq/L Final  . Potassium 06/12/2017 3.8  3.5 - 5.1 mEq/L Final  . Chloride 06/12/2017 102  96 - 112 mEq/L Final  . CO2 06/12/2017 27  19 - 32 mEq/L Final  . Glucose, Bld 06/12/2017 102* 70 - 99 mg/dL Final  . BUN 06/12/2017 11  6 - 23 mg/dL Final  . Creatinine, Ser 06/12/2017 0.85  0.40 - 1.20 mg/dL Final  . Calcium 06/12/2017 10.5  8.4 - 10.5 mg/dL Final  . GFR 06/12/2017 96.26  >60.00 mL/min Final  . Fructosamine 06/12/2017 258  0 - 285 umol/L Final   Comment: Published reference interval for apparently healthy subjects between age 47 and 62 is 62 - 285 umol/L and in a poorly controlled diabetic population is 228 - 563 umol/L with a mean of 396 umol/L.   Marland Kitchen Microalb, Ur 06/12/2017 <0.7  0.0 - 1.9 mg/dL Final  . Creatinine,U 06/12/2017 67.0  mg/dL Final  . Microalb Creat Ratio 06/12/2017 1.0  0.0 - 30.0 mg/g Final  . TSH  06/12/2017 1.22  0.35 - 4.50 uIU/mL Final  . Color, Urine 06/12/2017 YELLOW  Yellow;Lt. Yellow Final  . APPearance 06/12/2017 CLEAR  Clear Final  . Specific Gravity, Urine 06/12/2017 <=1.005* 1.000 - 1.030 Final  . pH 06/12/2017 5.5  5.0 - 8.0 Final  . Total Protein, Urine 06/12/2017 NEGATIVE  Negative Final  . Urine Glucose 06/12/2017 >=1000* Negative Final  . Ketones, ur 06/12/2017 NEGATIVE  Negative Final  . Bilirubin Urine 06/12/2017 NEGATIVE  Negative Final  . Hgb urine dipstick 06/12/2017 NEGATIVE  Negative Final  . Urobilinogen, UA 06/12/2017 0.2  0.0 - 1.0 Final  . Leukocytes, UA 06/12/2017 NEGATIVE  Negative Final  . Nitrite 06/12/2017 NEGATIVE  Negative Final  . WBC, UA 06/12/2017 none seen  0-2/hpf Final  . RBC / HPF 06/12/2017 none seen  0-2/hpf Final    Physical Examination:  BP (!) 146/94   Pulse 93   Ht _0  (1.803 m)   Wt (!) 410 lb (186 kg)   SpO2 97%   BMI 57.18 kg/m   GENERAL:         Patient has marked generalized obesity.   No ankle edema       ASSESSMENT:  Diabetes type 2, with BMI 58  See history of present illness for detailed discussion of current diabetes management, blood sugar patterns and problems identified  She has had improved control with adding Invokana especially with her fasting hyperglycemia She is doing well without side effects and agrees to continue this Although she says she is wanting to get off all her medications for diabetes explained to her that she is  needing considerable weight loss before this can be achieved Is only recently starting to make lifestyle changes   HYPERTENSION: Has only fair control with current regimen, may benefit from increasing Invokana further Also discussed that meloxicam may be interfering with her lisinopril and she needs to cut down to half a pill a day of the 15 mg  PLAN:     She will increase her Invokana, will double her 100 mg dose at home and then go to 300 mg  No change in metformin  More  blood sugar monitoring after meals and call if consistently high  Although she may be able to tolerate metformin ER better will continue the same dose for now since fasting readings are better.  Also may benefit from using Invokamet XR  She needs to follow-up with PCP regarding her blood pressure but meanwhile continue following low SODIUM diet   Discussed blood pressure targets  Increase exercise as tolerated  There are no Patient Instructions on file for this visit.   Counseling time on subjects discussed in assessment and plan sections is over 50% of today's 25 minute visit   Arhum Peeples 06/17/2017, 9:22 AM   Note: This office note was prepared with Estate agent. Any transcriptional errors that result from this process are unintentional.

## 2017-06-25 ENCOUNTER — Other Ambulatory Visit: Payer: Self-pay

## 2017-06-25 ENCOUNTER — Telehealth: Payer: Self-pay | Admitting: Endocrinology

## 2017-06-25 MED ORDER — FREESTYLE LIBRE READER DEVI: 1.0000 | 1 refills | Status: DC | PRN

## 2017-06-25 MED ORDER — FREESTYLE LIBRE SENSOR SYSTEM MISC: 3 refills | Status: DC

## 2017-06-25 NOTE — Telephone Encounter (Signed)
Called patient and let her know that I can send in the prescription but her insurance may not cover this prescription. She stated that she wants to pay for this out of pocket. I let her know there is a discount coupon that she can print out online. Also let her know to please call and make an appointment with Vaughan Basta to go over the [employer] application and maintenance. She will call when she receives this product.

## 2017-06-25 NOTE — Telephone Encounter (Signed)
Requesting new script for freestyle libre.  [facility] on [address] in [place].  Ty,  -LL

## 2017-06-25 NOTE — Telephone Encounter (Signed)
Please advise if you want to proceed with the [facility].

## 2017-06-25 NOTE — Telephone Encounter (Signed)
She will have to check with her insurance for coverage, likely will be denied because of not taking insulin

## 2017-06-26 NOTE — Telephone Encounter (Signed)
Patient is going to look at other pharmacy to see if she could pay out of pocket cost at a cheaper rate.

## 2017-07-01 ENCOUNTER — Ambulatory Visit (INDEPENDENT_AMBULATORY_CARE_PROVIDER_SITE_OTHER): Payer: BLUE CROSS/BLUE SHIELD | Admitting: Neurology

## 2017-07-01 DIAGNOSIS — Z6841 Body Mass Index (BMI) 40.0 and over, adult: Secondary | ICD-10-CM

## 2017-07-01 DIAGNOSIS — R6889 Other general symptoms and signs: Secondary | ICD-10-CM

## 2017-07-01 DIAGNOSIS — G4733 Obstructive sleep apnea (adult) (pediatric): Secondary | ICD-10-CM

## 2017-07-01 DIAGNOSIS — R400 Somnolence: Secondary | ICD-10-CM

## 2017-07-01 DIAGNOSIS — G478 Other sleep disorders: Secondary | ICD-10-CM

## 2017-07-01 DIAGNOSIS — G4710 Hypersomnia, unspecified: Secondary | ICD-10-CM

## 2017-07-01 DIAGNOSIS — R0683 Snoring: Secondary | ICD-10-CM

## 2017-07-02 ENCOUNTER — Telehealth: Payer: Self-pay

## 2017-07-02 NOTE — Addendum Note (Signed)
Addended by: Star Age on: 07/02/2017 07:48 AM   Modules accepted: Orders

## 2017-07-02 NOTE — Telephone Encounter (Signed)
I called pt. I advised pt that Dr. Rexene Alberts reviewed their sleep study results and found that pt has mild to moderate osa and recommends treatment for her osa in the form of a cpap. Dr. Rexene Alberts recommends that pt return for a repeat sleep study in order to properly titrate the cpap and ensure a good mask fit. Pt is agreeable to returning for a titration study. I advised pt that our sleep lab will file with pt's insurance and call pt to schedule the sleep study when we hear back from the pt's insurance regarding coverage of this sleep study. Pt verbalized understanding of results. Pt had no questions at this time but was encouraged to call back if questions arise.

## 2017-07-02 NOTE — Telephone Encounter (Signed)
Pt has called back and is asking for a call back with the results of her sleep study

## 2017-07-02 NOTE — Telephone Encounter (Signed)
I called pt to discuss her sleep study results. No answer, left a message asking her to call me back. 

## 2017-07-02 NOTE — Telephone Encounter (Signed)
-----   Message from Star Age, MD sent at 07/02/2017  7:48 AM EDT ----- Patient referred by Dr. Lorelei Pont, seen by me on 05/26/17, diagnostic PSG on 07/01/17.    Please call and notify the patient that the recent sleep study did confirm the diagnosis of mild to moderate obstructive sleep apnea, with a total AHI of 12.6/hour, REM AHI of 25.4/hour, supine AHI of 14.1/hour and O2 nadir of 86%. Given the patient's medical history and sleep related complaints (ESS of 19), I recommend treatment in the form of CPAP. This will require a repeat sleep study for proper titration and mask fitting. Please explain to patient and arrange for a CPAP titration study. I have placed an order in the chart. Thanks, and please route to [facility] for scheduling next sleep study.  Star Age, MD, PhD [facility])

## 2017-07-02 NOTE — Progress Notes (Signed)
Patient referred by Dr. Lorelei Pont, seen by me on 05/26/17, diagnostic PSG on 07/01/17.    Please call and notify the patient that the recent sleep study did confirm the diagnosis of mild to moderate obstructive sleep apnea, with a total AHI of 12.6/hour, REM AHI of 25.4/hour, supine AHI of 14.1/hour and O2 nadir of 86%. Given the patient's medical history and sleep related complaints (ESS of 19), I recommend treatment in the form of CPAP. This will require a repeat sleep study for proper titration and mask fitting. Please explain to patient and arrange for a CPAP titration study. I have placed an order in the chart. Thanks, and please route to [facility] for scheduling next sleep study.  Star Age, MD, PhD [facility])

## 2017-07-02 NOTE — Procedures (Signed)
PATIENT'S NAME:  Angel Dawson, Angel Dawson DOB:      1981/05/21      MR#:    [id number]     DATE OF RECORDING: 07/01/2017 REFERRING M.D.:  Lamar Blinks MD Study Performed:   Baseline Polysomnogram HISTORY: 36 year old woman with a history of diabetes, history of diabetic ketoacidosis, history of bronchitis last year, reflux disease, hypertension, recurrent headache, PCO S, history of pancreatitis, and morbid obesity, who had a home sleep test about 2-1/2 years ago which showed borderline obstructive sleep apnea with an AHI of 4.4 per hour, average oxygen saturation of 94%, nadir of 83%. The patient endorsed the Epworth Sleepiness Scale at 19/24 points. The patient's weight 412 pounds with a height of 71 (inches), resulting in a BMI of 57.7 kg/m2. The patient's neck circumference measured 18.5 inches.  CURRENT MEDICATIONS: Excedrin Migraine, Bayer, Blood Glucose Meter, Invokana, Coreg, Flonase, Prinizide, Mobic, Glucophage, Prilosec, One Touch Ultra Test, Onetouch Delica Lancets, Probiotic.   PROCEDURE:  This is a multichannel digital polysomnogram utilizing the Somnostar 11.2 system.  Electrodes and sensors were applied and monitored per AASM Specifications.   EEG, EOG, Chin and Limb EMG, were sampled at 200 Hz.  ECG, Snore and Nasal Pressure, Thermal Airflow, Respiratory Effort, CPAP Flow and Pressure, Oximetry was sampled at 50 Hz. Digital video and audio were recorded.      BASELINE STUDY  Patient was scheduled to stay for next day MSLT, but due to OSA, the MSLT was canceled. Lights Out was at 21:34 and Lights On at 05:06.  Total recording time (TRT) was 453 minutes, with a total sleep time (TST) of  406 minutes.   The patient's sleep latency was 22 minutes. REM latency was 179 minutes, which is delayed. The sleep efficiency was 89.6 %.     SLEEP ARCHITECTURE: WASO (Wake after sleep onset) was 37 minutes with mild to moderate sleep fragmentation noted.  There were 16.5 minutes in Stage N1, 188.5 minutes  Stage N2, 116 minutes Stage N3 and 85 minutes in Stage REM.  The percentage of Stage N1 was 4.1%, Stage N2 was 46.4%, which is normal, Stage N3 was 28.6%, which is increased, and Stage R (REM sleep) was 20.9%, which is normal. The arousals were noted as: 42 were spontaneous, 16 were associated with PLMs, 18 were associated with respiratory events.  Audio and video analysis did not show any abnormal or unusual movements, behaviors, phonations or vocalizations. The patient took no bathroom breaks. Mild to moderate snoring was noted. The EKG was in keeping with normal sinus rhythm (NSR).  RESPIRATORY ANALYSIS:  There were a total of 85 respiratory events:  1 obstructive apneas, 5 central apneas and 0 mixed apneas with a total of 6 apneas and an apnea index (AI) of .9 /hour. There were 79 hypopneas with a hypopnea index of 11.7 /hour. The patient also had 0 respiratory event related arousals (RERAs).      The total APNEA/HYPOPNEA INDEX (AHI) was 12.6/hour and the total RESPIRATORY DISTURBANCE INDEX was 12.6 /hour.  36 events occurred in REM sleep and 93 events in NREM. The REM AHI was 25.4 /hour, versus a non-REM AHI of 9.2. The patient spent 259 minutes of total sleep time in the supine position and 147 minutes in non-supine.. The supine AHI was 14.1 versus a non-supine AHI of 9.8.  OXYGEN SATURATION & C02:  The Wake baseline 02 saturation was 96%, with the lowest being 86%. Time spent below 89% saturation equaled 6 minutes.  PERIODIC LIMB MOVEMENTS: The  patient had a total of 54 Periodic Limb Movements.  The Periodic Limb Movement (PLM) index was 8. and the PLM Arousal index was 2.4/hour.  Post-study, the patient indicated that sleep was worse than usual.   IMPRESSION:  1. Obstructive Sleep Apnea (OSA)  RECOMMENDATIONS:  1. This study demonstrates overall mild to moderate obstructive sleep apnea, with a total AHI of 12.6/hour, REM AHI of 25.4/hour, supine AHI of 14.1/hour and O2 nadir of 86%. Given  the patient's medical history and sleep related complaints, a full-night CPAP titration study is recommended to optimize therapy. Other treatment options may include avoidance of supine sleep position along with weight loss, upper airway or jaw surgery in selected patients or the use of an oral appliance in certain patients. ENT evaluation and/or consultation with a maxillofacial surgeon or dentist may be feasible in some instances.    2. Please note that untreated obstructive sleep apnea carries additional perioperative morbidity. Patients with significant obstructive sleep apnea should receive perioperative PAP therapy and the surgeons and particularly the anesthesiologist should be informed of the diagnosis and the severity of the sleep disordered breathing. 3. The patient should be cautioned not to drive, work at heights, or operate dangerous or heavy equipment when tired or sleepy. Review and reiteration of good sleep hygiene measures should be pursued with any patient. 4. The patient will be seen in follow-up by Dr. Rexene Alberts at [facility] for discussion of the test results and further management strategies. The referring provider will be notified of the test results.  I certify that I have reviewed the entire raw data recording prior to the issuance of this report in accordance with the Standards of Accreditation of the American Academy of Sleep Medicine (AASM)   Star Age, MD, PhD Diplomat, American Board of Psychiatry and Neurology (Neurology and Sleep Medicine)

## 2017-07-27 ENCOUNTER — Encounter: Payer: Self-pay | Admitting: Family Medicine

## 2017-07-27 MED ORDER — LISINOPRIL-HYDROCHLOROTHIAZIDE 20-25 MG PO TABS: 1.0000 | ORAL_TABLET | Freq: Every day | ORAL | 2 refills | Status: DC

## 2017-08-03 ENCOUNTER — Encounter: Payer: Self-pay | Admitting: Family Medicine

## 2017-08-04 ENCOUNTER — Encounter: Payer: Self-pay | Admitting: Family Medicine

## 2017-08-14 ENCOUNTER — Other Ambulatory Visit: Payer: BLUE CROSS/BLUE SHIELD

## 2017-08-17 ENCOUNTER — Ambulatory Visit: Payer: BLUE CROSS/BLUE SHIELD | Admitting: Endocrinology

## 2017-08-17 ENCOUNTER — Ambulatory Visit (INDEPENDENT_AMBULATORY_CARE_PROVIDER_SITE_OTHER): Payer: BLUE CROSS/BLUE SHIELD | Admitting: Neurology

## 2017-08-17 DIAGNOSIS — G4733 Obstructive sleep apnea (adult) (pediatric): Secondary | ICD-10-CM

## 2017-08-17 DIAGNOSIS — R400 Somnolence: Secondary | ICD-10-CM

## 2017-08-17 DIAGNOSIS — G478 Other sleep disorders: Secondary | ICD-10-CM

## 2017-08-17 DIAGNOSIS — Z6841 Body Mass Index (BMI) 40.0 and over, adult: Secondary | ICD-10-CM

## 2017-08-19 ENCOUNTER — Other Ambulatory Visit: Payer: Self-pay | Admitting: Neurology

## 2017-08-19 ENCOUNTER — Telehealth: Payer: Self-pay | Admitting: Neurology

## 2017-08-19 DIAGNOSIS — G4733 Obstructive sleep apnea (adult) (pediatric): Secondary | ICD-10-CM

## 2017-08-19 NOTE — Procedures (Signed)
PATIENT'S NAME:  Angel Dawson, Angel Dawson DOB:      [birth year]      MR#:    [id number]     DATE OF RECORDING: 08/17/2017 REFERRING M.D.:  Lamar Blinks MD Study Performed:   CPAP  Titration HISTORY: 36 year old woman with a history of diabetes, history of diabetic ketoacidosis, history of bronchitis last year, reflux disease, hypertension, recurrent headache, PCOS, history of pancreatitis, and morbid obesity, who returns for a full night CPAP titration. Epworth Sleepiness Score of 19/24 points. BMI of 57.7 kg/m2. The patient's neck circumference measured 18.5 inches. Her baseline sleep study from 07/01/17 showed a total AHI of 12.6/hour, REM AHI of 25.4 /hour, supine AHI of 14.1/hour, O2 nadir of 86%.   CURRENT MEDICATIONS: Excedrin Migraine, Bayer, Blood Glucose Meter, Invokana, Coreg, Flonase, Prinizide, Mobic, Glucophage, Prilosec, One Touch Ultra Test, Onetouch Delica Lancets, Probiotic.  PROCEDURE:  This is a multichannel digital polysomnogram utilizing the SomnoStar 11.2 system.  Electrodes and sensors were applied and monitored per AASM Specifications.   EEG, EOG, Chin and Limb EMG, were sampled at 200 Hz.  ECG, Snore and Nasal Pressure, Thermal Airflow, Respiratory Effort, CPAP Flow and Pressure, Oximetry was sampled at 50 Hz. Digital video and audio were recorded.      The patient was fitted with large nasal pillows. CPAP was initiated at 5 cmH20 with heated humidity per AASM standards and pressure was advanced to 7 cmH20 because of hypopneas, apneas and desaturations.  At a PAP pressure of 7 cmH20, there was a reduction of the AHI to 0.3/hour, with non-supine REM sleep achieved and O2 nadir of 93%.    Lights Out was at 00:05 and Lights On at 07:31. Total recording time (TRT) was 446.5 minutes, with a total sleep time (TST) of 417.5 minutes. The patient's sleep latency was 10.5 minutes, REM latency was 91.5 minutes, which is normal. The sleep efficiency was 93.5 %.    SLEEP ARCHITECTURE: WASO (Wake  after sleep onset) was 18 minutes with minimal sleep fragmentation noted. There were 7.5 minutes in Stage N1, 203.5 minutes Stage N2, 117 minutes Stage N3 and 89.5 minutes in Stage REM.  The percentage of Stage N1 was 1.8%, Stage N2 was 48.7%, which is normal, Stage N3 was 28%, which is increased, and Stage R (REM sleep) was 21.4%, which is normal. The arousals were noted as: 56 were spontaneous, 0 were associated with PLMs, and 1 were associated with respiratory events.  Audio and video analysis did not show any abnormal or unusual movements, behaviors, phonations or vocalizations. The patient took no bathroom breaks. The EKG was in keeping with normal sinus rhythm (NSR).  RESPIRATORY ANALYSIS:  There was a total of 11 respiratory events: 3 obstructive apneas, 1 central apneas and 0 mixed apneas with a total of 4 apneas and an apnea index (AI) of .6 /hour. There were 7 hypopneas with a hypopnea index of 1./hour. The patient also had 0 respiratory event related arousals (RERAs).      The total APNEA/HYPOPNEA INDEX  (AHI) was 1.6 /hour and the total RESPIRATORY DISTURBANCE INDEX was 1.6 .hour  5 events occurred in REM sleep and 6 events in NREM. The REM AHI was 3.4 /hour versus a non-REM AHI of 1.1 /hour.  The patient spent 195 minutes of total sleep time in the supine position and 223 minutes in non-supine. The supine AHI was 2.8, versus a non-supine AHI of 0.5.  OXYGEN SATURATION & C02:  The baseline 02 saturation was 95%, with the lowest  being 86%. Time spent below 89% saturation equaled 3 minutes.  PERIODIC LIMB MOVEMENTS:    The patient had a total of 0 Periodic Limb Movements. The Periodic Limb Movement (PLM) index was 0 and the PLM Arousal index was 0 /hour.  Post-study, the patient indicated that sleep was better than usual.   IMPRESSION:   1. Obstructive Sleep Apnea (OSA)   RECOMMENDATIONS:   1. This study demonstrates resolution of the patient's obstructive sleep apnea with CPAP  therapy. I will, therefore, start the patient on home CPAP treatment at a pressure of 7 cm via nasal pillows with heated humidity. The patient should be reminded to be fully compliant with PAP therapy to improve sleep related symptoms and decrease long term cardiovascular risks. The patient should be reminded, that it may take up to 3 months to get fully used to using PAP with all planned sleep. The earlier full compliance is achieved, the better long term compliance tends to be. Please note that untreated obstructive sleep apnea carries additional perioperative morbidity. Patients with significant obstructive sleep apnea should receive perioperative PAP therapy and the surgeons and particularly the anesthesiologist should be informed of the diagnosis and the severity of the sleep disordered breathing. 2. Other treatment options for OSA may include avoidance of supine sleep position along with weight loss, upper airway or jaw surgery in selected patients or the use of an oral appliance in certain patients. ENT evaluation and/or consultation with a maxillofacial surgeon or dentist may be feasible in some instances.    3. The patient should be cautioned not to drive, work at heights, or operate dangerous or heavy equipment when tired or sleepy. Review and reiteration of good sleep hygiene measures should be pursued with any patient. 4. The patient will be seen in follow-up by Dr. Rexene Alberts at [facility] for discussion of the test results and further management strategies. The referring provider will be notified of the test results.   I certify that I have reviewed the entire raw data recording prior to the issuance of this report in accordance with the Standards of Accreditation of the American Academy of Sleep Medicine (AASM)     Star Age, MD, PhD Diplomat, American Board of Psychiatry and Neurology (Neurology and Sleep Medicine)

## 2017-08-19 NOTE — Telephone Encounter (Signed)
Called the patient to review the sleep study results.no answer. LVM for the patient to call back.

## 2017-08-19 NOTE — Progress Notes (Signed)
CPAP ordered

## 2017-08-19 NOTE — Progress Notes (Signed)
Patient referred by Dr. Lorelei Pont, seen by me on 05/26/17, diagnostic PSG on 07/01/17, CPAP study on 08/17/17:  Please call and inform patient that I have entered an order for treatment with positive airway pressure (PAP) treatment of obstructive sleep apnea (OSA). She did well during the latest sleep study with CPAP. We will, therefore, arrange for a machine for home use through a DME (durable medical equipment) company of Her choice; and I will see the patient back in follow-up in about 10 weeks. Please also explain to the patient that I will be looking out for compliance data, which can be downloaded from the machine (stored on an SD card, that is inserted in the machine) or via remote access through a modem, that is built into the machine. At the time of the followup appointment we will discuss sleep study results and how it is going with PAP treatment at home. Please advise patient to bring Her machine at the time of the first FU visit, even though this is cumbersome. Bringing the machine for every visit after that will likely not be needed, but often helps for the first visit to troubleshoot if needed. Please re-enforce the importance of compliance with treatment and the need for [place] to monitor compliance data - often an insurance requirement and actually good feedback for the patient as far as how they are doing.  Also remind patient, that any interim PAP machine or mask issues should be first addressed with the DME company, as they can often help better with technical and mask fit issues. Please ask if patient has a preference regarding DME company.  Please also make sure, the patient has a follow-up appointment with me or MM or CM in about 10 weeks from the setup date, thanks.  Once you have spoken to the patient - and faxed/routed report to PCP and referring MD (if other than PCP), you can close this encounter, thanks,   Star Age, MD, PhD [facility] ([place])

## 2017-08-19 NOTE — Telephone Encounter (Signed)
-----   Message from Star Age, MD sent at 08/19/2017  8:05 AM EST ----- Patient referred by Dr. Lorelei Pont, seen by me on 05/26/17, diagnostic PSG on 07/01/17, CPAP study on 08/17/17:  Please call and inform patient that I have entered an order for treatment with positive airway pressure (PAP) treatment of obstructive sleep apnea (OSA). She did well during the latest sleep study with CPAP. We will, therefore, arrange for a machine for home use through a DME (durable medical equipment) company of Her choice; and I will see the patient back in follow-up in about 10 weeks. Please also explain to the patient that I will be looking out for compliance data, which can be downloaded from the machine (stored on an SD card, that is inserted in the machine) or via remote access through a modem, that is built into the machine. At the time of the followup appointment we will discuss sleep study results and how it is going with PAP treatment at home. Please advise patient to bring Her machine at the time of the first FU visit, even though this is cumbersome. Bringing the machine for every visit after that will likely not be needed, but often helps for the first visit to troubleshoot if needed. Please re-enforce the importance of compliance with treatment and the need for [place] to monitor compliance data - often an insurance requirement and actually good feedback for the patient as far as how they are doing.  Also remind patient, that any interim PAP machine or mask issues should be first addressed with the DME company, as they can often help better with technical and mask fit issues. Please ask if patient has a preference regarding DME company.  Please also make sure, the patient has a follow-up appointment with me or MM or CM in about 10 weeks from the setup date, thanks.  Once you have spoken to the patient - and faxed/routed report to PCP and referring MD (if other than PCP), you can close this encounter, thanks,   Star Age, MD, PhD [facility] ([place])

## 2017-08-20 NOTE — Telephone Encounter (Signed)
I called pt to discuss her sleep study results. No answer, left a message asking her to call me back. 

## 2017-08-26 NOTE — Telephone Encounter (Signed)
I called Angel Dawson. I advised Angel Dawson that Dr. Rexene Alberts reviewed their sleep study results and found that Angel Dawson did well during the latest sleep study with cpap. Dr. Rexene Alberts recommends that Angel Dawson start a cpap at home. I reviewed PAP compliance expectations with the Angel Dawson. Angel Dawson is agreeable to starting a CPAP. I advised Angel Dawson that an order will be sent to a DME, [facility], and [facility] will call the Angel Dawson within about one week after they file with the Angel Dawson's insurance. [facility] will show the Angel Dawson how to use the machine, fit for masks, and troubleshoot the CPAP if needed. A follow up appt was made for insurance purposes with Dr. Rexene Alberts on Monday, 11/16/17 at 10:30am. Angel Dawson verbalized understanding to arrive 15 minutes early and bring their CPAP. A letter with all of this information in it will be mailed to the Angel Dawson as a reminder. I verified with the Angel Dawson that the address we have on file is correct. Angel Dawson verbalized understanding of results. Angel Dawson had no questions at this time but was encouraged to call back if questions arise.

## 2017-09-16 ENCOUNTER — Other Ambulatory Visit (INDEPENDENT_AMBULATORY_CARE_PROVIDER_SITE_OTHER): Payer: BLUE CROSS/BLUE SHIELD

## 2017-09-16 DIAGNOSIS — E1165 Type 2 diabetes mellitus with hyperglycemia: Secondary | ICD-10-CM

## 2017-09-16 LAB — BASIC METABOLIC PANEL
BUN: 12 mg/dL (ref 6–23)
CALCIUM: 9.9 mg/dL (ref 8.4–10.5)
CO2: 29 mEq/L (ref 19–32)
CREATININE: 0.86 mg/dL (ref 0.40–1.20)
Chloride: 100 mEq/L (ref 96–112)
GFR: 94.84 mL/min (ref >60.00)
GLUCOSE: 152 mg/dL — AB (ref 70–99)
Potassium: 3.5 mEq/L (ref 3.5–5.1)
Sodium: 136 mEq/L (ref 135–145)

## 2017-09-16 LAB — HEMOGLOBIN A1C: HEMOGLOBIN A1C: 6.3 % (ref 4.6–6.5)

## 2017-09-17 NOTE — Progress Notes (Signed)
Patient ID: Angel Dawson, female   DOB: March 17, 1981, 36 y.o.   MRN: [id number]          Reason for Appointment: Follow-up Type 2 Diabetes  Referring physician: [name] [name]   History of Present Illness:          Date of diagnosis of type 2 diabetes mellitus:  2015      Background history:   She had extreme thirst and urination and was admitted to the hospital with blood sugars over 400 but no overt acidosis However she was discharged only on metformin 500 mg twice a day which she has continued Prior to that in 2014 she appears to have had mild hyperglycemia with blood sugars up to 195  Recent history:    Non-insulin hypoglycemic drugs the patient is taking are: Metformin 500 mg twice a day, Invokana 100 mg daily  Her last A1c was 7.1% in June it is now 6.3  Current management, blood sugar patterns and problems identified:  She has had improved control with initiating Invokana in 04/2017 and more recently taking the 300 mg dose  On her own she started using the freestyle Libre sensor after her last visit but has had taking her difficulties and not being able to use it.  Her reader does not have any data on it and has only 3 blood sugars done by fingerstick; also she thinks it was reading falsely low  Random blood sugars are ranging from 92 up to 153 and lab glucose nonfasting was 152  Previously her home blood sugars were averaging 132  Despite reminders she has not done any exercise  She has not lost any weight even with increasing Invokana  She thinks she is cutting back on carbohydrates and high-fat foods  Side effects from medications have been: Diarrhea from high dose metformin  Glucose monitoring:  done <1 times a day         Glucometer: One Touch/Freestyle .    Results as above   Self-care: The diet that the patient has been following is: tries to limit carbs.               Dietician visit, most recent: 05/2017, previously in classes               Exercise:   very irregular  Weight history:  Wt Readings from Last 3 Encounters:  06/17/17 (!) 410 lb (186 kg)  06/12/17 (!) 412 lb (186.9 kg)  05/26/17 (!) 412 lb (186.9 kg)    Glycemic control:   Lab Results  Component Value Date   HGBA1C 6.3 09/16/2017   HGBA1C 7.1 (H) 04/08/2017   HGBA1C 8.0 (H) 01/07/2017   Lab Results  Component Value Date   MICROALBUR <0.7 06/12/2017   [place] 95 10/08/2016   CREATININE 0.86 09/16/2017   Lab Results  Component Value Date   MICRALBCREAT 1.0 06/12/2017    Lab Results  Component Value Date   FRUCTOSAMINE 258 06/12/2017   Lab on 09/16/2017  Component Date Value Ref Range Status  . Sodium 09/16/2017 136  135 - 145 mEq/L Final  . Potassium 09/16/2017 3.5  3.5 - 5.1 mEq/L Final  . Chloride 09/16/2017 100  96 - 112 mEq/L Final  . CO2 09/16/2017 29  19 - 32 mEq/L Final  . Glucose, Bld 09/16/2017 152* 70 - 99 mg/dL Final  . BUN 09/16/2017 12  6 - 23 mg/dL Final  . Creatinine, Ser 09/16/2017 0.86  0.40 - 1.20 mg/dL Final  .  Calcium 09/16/2017 9.9  8.4 - 10.5 mg/dL Final  . GFR 09/16/2017 94.84  >60.00 mL/min Final  . Hgb A1c MFr Bld 09/16/2017 6.3  4.6 - 6.5 % Final   Glycemic Control Guidelines for People with Diabetes:Non Diabetic:  <6%Goal of Therapy: <7%Additional Action Suggested:  >8%       Allergies as of 09/18/2017   No Known Allergies     Medication List        Accurate as of 09/18/17  9:11 AM. Always use your most recent med list.          aspirin-acetaminophen-caffeine 250-250-65 MG tablet Commonly known as:  EXCEDRIN MIGRAINE Take 2 tablets by mouth as needed for headache.   BAYER BACK & BODY 500-32.5 MG Tabs Generic drug:  Aspirin-Caffeine Take 2 tablets by mouth as needed.   blood glucose meter kit and supplies Dispense based on patient and insurance preference. Use up to four times daily as directed. (FOR ICD-9 250.00, 250.01).   canagliflozin 300 MG Tabs tablet Commonly known as:  INVOKANA Take 1 tablet (300 mg  total) by mouth daily before breakfast.   carvedilol 25 MG tablet Commonly known as:  COREG Take 1 tablet twice a day with meals   fluticasone 50 MCG/ACT nasal spray Commonly known as:  FLONASE USE 1 SPRAY IN EACH NOSTRIL EVERY DAY   FREESTYLE LIBRE READER Devi Inject 1 Device into the skin as needed. Use sensor every 10 days.   FREESTYLE LIBRE SENSOR SYSTEM Misc Use one sensor every 10 days.   lisinopril-hydrochlorothiazide 20-25 MG tablet Commonly known as:  PRINZIDE,ZESTORETIC Take 1 tablet by mouth daily.   meloxicam 15 MG tablet Commonly known as:  MOBIC Take 1 tablet (15 mg total) by mouth daily.   metFORMIN 500 MG tablet Commonly known as:  GLUCOPHAGE Take 500 mg by mouth 2 (two) times daily with a meal.   omeprazole 20 MG capsule Commonly known as:  PRILOSEC TAKE 1 CAPSULE DAILY (SCHEDULE FOLLOW UP APPOINTMENT)   ONE TOUCH ULTRA TEST test strip Generic drug:  glucose blood TEST ONCE DAILY   ONETOUCH DELICA LANCETS 16X Misc Use once daily to check glucose.   OVER THE COUNTER MEDICATION One a Day Women's Vitamin-Take 1 tablet by mouth daily.   PROBIOTIC-10 PO Take 1 capsule by mouth daily. Takes Fortify Women's Digestive Probiotic       Allergies: No Known Allergies  Past Medical History:  Diagnosis Date  . Abdominal pain    related to gallstones  . Achilles tendonitis, bilateral   . Bronchitis    02/17  . Diabetes ([place])   . DKA (diabetic ketoacidoses) ([place]) 08/25/2014  . Gallstones   . GERD (gastroesophageal reflux disease)   . Headache   . Hypertension   . Lattice degeneration, both eyes   . Pancreatitis   . PCOS (polycystic ovarian syndrome)   . Sinus congestion    occ. uses OTC meds for this    Past Surgical History:  Procedure Laterality Date  . CHOLECYSTECTOMY N/A 06/22/2013   Procedure: LAPAROSCOPIC CHOLECYSTECTOMY ;  Surgeon: Adin Hector, MD;  Location: [facility];  Service: General;  Laterality: N/A;  . DILITATION &  CURRETTAGE/HYSTROSCOPY WITH HYDROTHERMAL ABLATION N/A 01/03/2016   Procedure: DILATATION & CURETTAGE/HYSTEROSCOPY WITH HYDROTHERMAL ABLATION;  Surgeon: Crawford Givens, MD;  Location: [facility];  Service: Gynecology;  Laterality: N/A;  HTA rep will be attend confirmed by office 12/12/15   . TONSILLECTOMY      Family History  Problem Relation  Age of Onset  . Hypertension Mother   . Diabetes Mother   . Goiter Mother   . Hypertension Father   . Diabetes Father   . Asthma Father   . Stroke Paternal Grandfather   . Lung cancer Paternal Grandmother        smoker-heavy  . Diabetes Maternal Aunt   . Colon cancer Neg Hx   . Heart disease Neg Hx     Social History:  reports that  has never smoked. she has never used smokeless tobacco. She reports that she drinks alcohol. She reports that she does not use drugs.   Review of Systems   Lipid history: Has never been on medications    Lab Results  Component Value Date   CHOL 168 10/08/2016   HDL 41.90 10/08/2016   [place] 95 10/08/2016   LDLDIRECT 87.5 09/22/2014   TRIG 153.0 (H) 10/08/2016   CHOLHDL 4 10/08/2016           Hypertension: Present for the last 10 years, monitored by PCP  She has been taking meloxicam 15 mg less less frequently for her back as recommended Also was recommended cutting back on high sodium foods and fast food Blood pressure is better available also on 300 Invokana now    BP Readings from Last 3 Encounters:  09/18/17 128/78  06/17/17 (!) 146/94  05/26/17 (!) 153/99     Most recent eye exam was 6/18, followed by a retina specialist but no diabetic retinopathy present  Most recent foot exam: 7/18    LABS:  Lab on 09/16/2017  Component Date Value Ref Range Status  . Sodium 09/16/2017 136  135 - 145 mEq/L Final  . Potassium 09/16/2017 3.5  3.5 - 5.1 mEq/L Final  . Chloride 09/16/2017 100  96 - 112 mEq/L Final  . CO2 09/16/2017 29  19 - 32 mEq/L Final  . Glucose, Bld 09/16/2017 152* 70 - 99 mg/dL  Final  . BUN 09/16/2017 12  6 - 23 mg/dL Final  . Creatinine, Ser 09/16/2017 0.86  0.40 - 1.20 mg/dL Final  . Calcium 09/16/2017 9.9  8.4 - 10.5 mg/dL Final  . GFR 09/16/2017 94.84  >60.00 mL/min Final  . Hgb A1c MFr Bld 09/16/2017 6.3  4.6 - 6.5 % Final   Glycemic Control Guidelines for People with Diabetes:Non Diabetic:  <6%Goal of Therapy: <7%Additional Action Suggested:  >8%     Physical Examination:  BP 128/78   Pulse 83   Ht 5' 11"  (1.803 m)   SpO2 98%   BMI 57.18 kg/m         ASSESSMENT:  Diabetes type 2, with Morbid obesity   See history of present illness for detailed discussion of current diabetes management, blood sugar patterns and problems identified  Her A1c is excellent now at 6.3  She has benefited significantly from using Invokana especially 300 mg dose but also improving her diet She can do much better with exercise regimen and she thinks she can do better with diet also Currently not receiving any weight loss Also not getting any reliable home glucose monitoring recently and not able to use the freestyle Libre for various reasons   HYPERTENSION: Has much better controlled now   PLAN:     She will use FreeStyle or One Touch to monitor her blood sugars rotating times between fasting and postprandial  No change in medication regimen  Continue to improve diet portion and carbohydrate control   Increase exercise  and given her  ideas about indoor exercises to do as tolerated  Patient Instructions  Check blood sugars on waking up  2/7   Also check blood sugars about 2 hours after a meal and do this after different meals by rotation  Recommended blood sugar levels on waking up is 90-130 and about 2 hours after meal is 130-160  Please bring your blood sugar monitor to each visit, thank you         Elayne Snare 09/18/2017, 9:11 AM   Note: This office note was prepared with Dragon voice recognition system technology. Any transcriptional errors that  result from this process are unintentional.

## 2017-09-18 ENCOUNTER — Encounter: Payer: Self-pay | Admitting: Endocrinology

## 2017-09-18 ENCOUNTER — Ambulatory Visit (INDEPENDENT_AMBULATORY_CARE_PROVIDER_SITE_OTHER): Payer: BLUE CROSS/BLUE SHIELD | Admitting: Endocrinology

## 2017-09-18 VITALS — BP 128/78 | HR 83 | Ht 71.0 in

## 2017-09-18 DIAGNOSIS — E1165 Type 2 diabetes mellitus with hyperglycemia: Secondary | ICD-10-CM

## 2017-09-18 NOTE — Patient Instructions (Addendum)
Check blood sugars on waking up  2/7   Also check blood sugars about 2 hours after a meal and do this after different meals by rotation  Recommended blood sugar levels on waking up is 90-130 and about 2 hours after meal is 130-160  Please bring your blood sugar monitor to each visit, thank you  INDOOR EXERCISE IDEAS   Use the following examples for a creative indoor workout (perform each move for 2-3 minutes):   Warm up. Put on some music that makes you feel like moving, and dance around the living room.  Watch exercise shows on TV and move along with them. There are tons of free cable channels that have daily exercise shows on them for all levels - beginner through advanced.   You can easily find a number of exercise videos but use one that will suit your liking and exercise level; you can do these on your own schedule.  Walk up and down the steps.  Do dumbbell curls and presses (if you don't have weights, use full water bottles).  Do assisted squats, keeping your back on a fitness ball against the wall or using the back of the couch for support.  Shadow box: Lift and lower the left leg; jab with the right arm, then the left; then lift and lower the right leg.  Fence (you don't even need swords). Pretend you're holding a sword in each hand. Create an X pattern standing still, then moving forward and back.  Hop on your exercise bike or treadmill -- or, for something different, use a weighted hula hoop. If you don't have any of those, just go back to dancing.  Do abdominal crunches (hold a weighted ball for added resistance).  Cool down with [employer] "I Feel Good" -- or whatever tune makes you feel good

## 2017-09-23 ENCOUNTER — Ambulatory Visit (INDEPENDENT_AMBULATORY_CARE_PROVIDER_SITE_OTHER): Payer: BLUE CROSS/BLUE SHIELD | Admitting: Podiatry

## 2017-09-23 ENCOUNTER — Encounter: Payer: Self-pay | Admitting: Podiatry

## 2017-09-23 DIAGNOSIS — L600 Ingrowing nail: Secondary | ICD-10-CM | POA: Diagnosis not present

## 2017-09-23 DIAGNOSIS — Z79899 Other long term (current) drug therapy: Secondary | ICD-10-CM

## 2017-09-23 DIAGNOSIS — L603 Nail dystrophy: Secondary | ICD-10-CM | POA: Diagnosis not present

## 2017-09-23 LAB — CBC WITH DIFFERENTIAL/PLATELET
BASOS ABS: 40 {cells}/uL (ref 0–200)
Basophils Relative: 0.4 %
EOS ABS: 162 {cells}/uL (ref 15–500)
Eosinophils Relative: 1.6 %
HCT: 41.4 % (ref 35.0–45.0)
Hemoglobin: 13.8 g/dL (ref 11.7–15.5)
Lymphs Abs: 4242 cells/uL — ABNORMAL HIGH (ref 850–3900)
MCH: 28 pg (ref 27.0–33.0)
MCHC: 33.3 g/dL (ref 32.0–36.0)
MCV: 84 fL (ref 80.0–100.0)
MONOS PCT: 5 %
MPV: 10.8 fL (ref 7.5–12.5)
Neutro Abs: 5151 cells/uL (ref 1500–7800)
Neutrophils Relative %: 51 %
PLATELETS: 337 10*3/uL (ref 140–400)
RBC: 4.93 10*6/uL (ref 3.80–5.10)
RDW: 13.4 % (ref 11.0–15.0)
TOTAL LYMPHOCYTE: 42 %
WBC mixed population: 505 cells/uL (ref 200–950)
WBC: 10.1 10*3/uL (ref 3.8–10.8)

## 2017-09-23 LAB — HEPATIC FUNCTION PANEL
AG RATIO: 1.6 (calc) (ref 1.0–2.5)
ALKALINE PHOSPHATASE (APISO): 76 U/L (ref 33–115)
ALT: 17 U/L (ref 6–29)
AST: 16 U/L (ref 10–30)
Albumin: 4.4 g/dL (ref 3.6–5.1)
BILIRUBIN INDIRECT: 0.3 mg/dL (ref 0.2–1.2)
BILIRUBIN TOTAL: 0.4 mg/dL (ref 0.2–1.2)
Bilirubin, Direct: 0.1 mg/dL (ref 0.0–0.2)
Globulin: 2.8 g/dL (calc) (ref 1.9–3.7)
TOTAL PROTEIN: 7.2 g/dL (ref 6.1–8.1)

## 2017-09-23 MED ORDER — TERBINAFINE HCL 250 MG PO TABS: 250.0000 mg | ORAL_TABLET | Freq: Every day | ORAL | 0 refills | Status: DC

## 2017-09-23 NOTE — Patient Instructions (Signed)
Soak Instructions    THE DAY AFTER THE PROCEDURE  Place 1/4 cup of epsom salts in a quart of warm tap water.  Submerge your foot or feet with outer bandage intact for the initial soak; this will allow the bandage to become moist and wet for easy lift off.  Once you remove your bandage, continue to soak in the solution for 20 minutes.  This soak should be done twice a day.  Next, remove your foot or feet from solution, blot dry the affected area and cover.  You may use a band aid large enough to cover the area or use gauze and tape.  Apply other medications to the area as directed by the doctor such as polysporin neosporin.  IF YOUR SKIN BECOMES IRRITATED WHILE USING THESE INSTRUCTIONS, IT IS OKAY TO SWITCH TO  WHITE VINEGAR AND WATER. Or you may use antibacterial soap and water to keep the toe clean  Monitor for any signs/symptoms of infection. Call the office immediately if any occur or go directly to the emergency room. Call with any questions/concerns.    Terbinafine oral granules What is this medicine? TERBINAFINE (TER bin a feen) is an antifungal medicine. It is used to treat certain kinds of fungal or yeast infections. This medicine may be used for other purposes; ask your health care provider or pharmacist if you have questions. COMMON BRAND NAME(S): Lamisil What should I tell my health care provider before I take this medicine? They need to know if you have any of these conditions: -drink alcoholic beverages -kidney disease -liver disease -an unusual or allergic reaction to Terbinafine, other medicines, foods, dyes, or preservatives -pregnant or trying to get pregnant -breast-feeding How should I use this medicine? Take this medicine by mouth. Follow the directions on the prescription label. Hold packet with cut line on top. Shake packet gently to settle contents. Tear packet open along cut line, or use scissors to cut across line. Carefully pour the entire contents of packet  onto a spoonful of a soft food, such as pudding or other soft, non-acidic food such as mashed potatoes (do NOT use applesauce or a fruit-based food). If two packets are required for each dose, you may either sprinkle the content of both packets on one spoonful of non-acidic food, or sprinkle the contents of both packets on two spoonfuls of non-acidic food. Make sure that no granules remain in the packet. Swallow the mxiture of the food and granules without chewing. Take your medicine at regular intervals. Do not take it more often than directed. Take all of your medicine as directed even if you think you are better. Do not skip doses or stop your medicine early. Contact your pediatrician or health care professional regarding the use of this medicine in children. While this medicine may be prescribed for children as young as 4 years for selected conditions, precautions do apply. Overdosage: If you think you have taken too much of this medicine contact a poison control center or emergency room at once. NOTE: This medicine is only for you. Do not share this medicine with others. What if I miss a dose? If you miss a dose, take it as soon as you can. If it is almost time for your next dose, take only that dose. Do not take double or extra doses. What may interact with this medicine? Do not take this medicine with any of the following medications: -thioridazine This medicine may also interact with the following medications: -beta-blockers -caffeine -cimetidine -cyclosporine -  MAOIs like Carbex, Eldepryl, Marplan, Nardil, and Parnate -medicines for fungal infections like fluconazole and ketoconazole -medicines for irregular heartbeat like amiodarone, flecainide and propafenone -rifampin -SSRIs like citalopram, escitalopram, fluoxetine, fluvoxamine, paroxetine and sertraline -tricyclic antidepressants like amitriptyline, clomipramine, desipramine, imipramine, nortriptyline, and others -warfarin This list  may not describe all possible interactions. Give your health care provider a list of all the medicines, herbs, non-prescription drugs, or dietary supplements you use. Also tell them if you smoke, drink alcohol, or use illegal drugs. Some items may interact with your medicine. What should I watch for while using this medicine? Your doctor may monitor your liver function. Tell your doctor right away if you have nausea or vomiting, loss of appetite, stomach pain on your right upper side, yellow skin, dark urine, light stools, or are over tired. You need to take this medicine for 6 weeks or longer to cure the fungal infection. Take your medicine regularly for as long as your doctor or health care professional tells you to. What side effects may I notice from receiving this medicine? Side effects that you should report to your doctor or health care professional as soon as possible: -allergic reactions like skin rash or hives, swelling of the face, lips, or tongue -change in vision -dark urine -fever or infection -general ill feeling or flu-like symptoms -light-colored stools -loss of appetite, nausea -redness, blistering, peeling or loosening of the skin, including inside the mouth -right upper belly pain -unusually weak or tired -yellowing of the eyes or skin Side effects that usually do not require medical attention (report to your doctor or health care professional if they continue or are bothersome): -changes in taste -diarrhea -hair loss -muscle or joint pain -stomach upset This list may not describe all possible side effects. Call your doctor for medical advice about side effects. You may report side effects to FDA at 1-800-FDA-1088. Where should I keep my medicine? Keep out of the reach of children. Store at room temperature between 15 and 30 degrees C (59 and 86 degrees F). Throw away any unused medicine after the expiration date. NOTE: This sheet is a summary. It may not cover all  possible information. If you have questions about this medicine, talk to your doctor, pharmacist, or health care provider.  2018 Elsevier/Gold Standard (2007-12-10 17:25:48)  

## 2017-09-23 NOTE — Progress Notes (Signed)
Subjective: Angel Dawson presents the office today for concerns of ingrown toenail to her right big toe pointing to the lateral aspect which is been ongoing for the last several weeks.  She states the area normally is painful towards the tip of the toe initially cut on herself but now is becoming more the base of the toenail in the middle part of the nail on the lateral aspect.  She has been denies any drainage or pus.  Regards to nail fungus the nail still thick and discolored.  She has been on Lamisil twice and she states that it gets to the point where it looks like it is completely grown out except for small amount and she stopped the medicine and the fungus comes back.  She will to discuss treatment options for this as well today.  No acute changes since last appointment.  Her Achilles tendon is doing well her last A1c was 6.3.  Denies any claudication symptoms. Denies any systemic complaints such as fevers, chills, nausea, vomiting. No acute changes since last appointment, and no other complaints at this time.   Objective: AAO x3, NAD DP/PT pulses palpable bilaterally, CRT less than 3 seconds There is incurvation along the lateral aspect of the right hallux toenail tenderness palpation along the nail corner.  There is minimal edema there is no erythema, ascending cellulitis or drainage or pus.  There is tenderness to the lateral nail corner.  The right hallux toenail in general is hypertrophic, dystrophic, discolored.  The nail has darkened discoloration but there is no extension of any hyperpigmentation of the surrounding skin. No open lesions or pre-ulcerative lesions.  No pain with calf compression, swelling, warmth, erythema  Assessment: Symptomatic ingrown toenail right lateral hallux with onychomycosis  Plan: -All treatment options discussed with the patient including all alternatives, risks, complications.  -Regards to onychomycosis we discussed treatment options.  She wants to do one more  round of Lamisil.  Discussed with her total nail avulsion but she declines this.  We also discussed laser therapy and she will consider this once the procedure of an ingrown toenail heels.  Also ordered a CBC and LFT prior to starting Lamisil today. -In regard to the ingrown toenail we discussed both conservative and surgical options and at this point she like to proceed with a partial nail avulsion with chemical matricectomy. -At this time, the patient is requesting partial nail removal with chemical matricectomy to the symptomatic portion of the nail. Risks and complications were discussed with the patient for which they understand and written consent was obtained. Under sterile conditions a total of 3 mL of a mixture of 2% lidocaine plain and 0.5% Marcaine plain was infiltrated in a hallux block fashion. Once anesthetized, the skin was prepped in sterile fashion. A tourniquet was then applied. Next the lateral aspect of hallux nail border was then sharply excised making sure to remove the entire offending nail border. Once the nails were ensured to be removed area was debrided and the underlying skin was intact. There is no purulence identified in the procedure. Next phenol was then applied under standard conditions and copiously irrigated. Silvadene was applied. A dry sterile dressing was applied. After application of the dressing the tourniquet was removed and there is found to be an immediate capillary refill time to the digit. The patient tolerated the procedure well any complications. Post procedure instructions were discussed the patient for which he verbally understood. Follow-up in one week for nail check or sooner if any problems  are to arise. Discussed signs/symptoms of infection and directed to call the office immediately should any occur or go directly to the emergency room. In the meantime, encouraged to call the office with any questions, concerns, changes symptoms. -Patient encouraged to call  the office with any questions, concerns, change in symptoms.   Trula Slade DPM

## 2017-09-24 ENCOUNTER — Telehealth: Payer: Self-pay | Admitting: *Deleted

## 2017-09-24 NOTE — Telephone Encounter (Signed)
I informed pt of Dr. Wagoner's review of results and orders. 

## 2017-09-24 NOTE — Telephone Encounter (Signed)
-----   Message from Trula Slade, DPM sent at 09/24/2017  7:01 AM EST ----- Please let her know that her blood work is normal and she can start Lamsil.

## 2017-09-25 NOTE — Telephone Encounter (Signed)
Received this notice from [employer]: "I have been trying to reach this patient to schedule, I have made 3 attempts as of today.  I am voiding the sales order but I can recreate if I make contact with her.  Her name is on the Voicemail so I know I am reaching the correct number.  If you hear from her will you ask her to reach out to [place]?"  I have reached out to pt via mychart.

## 2017-09-29 ENCOUNTER — Ambulatory Visit (INDEPENDENT_AMBULATORY_CARE_PROVIDER_SITE_OTHER): Payer: BLUE CROSS/BLUE SHIELD | Admitting: Podiatry

## 2017-09-29 ENCOUNTER — Encounter: Payer: Self-pay | Admitting: Podiatry

## 2017-09-29 DIAGNOSIS — B351 Tinea unguium: Secondary | ICD-10-CM | POA: Diagnosis not present

## 2017-09-29 DIAGNOSIS — Z79899 Other long term (current) drug therapy: Secondary | ICD-10-CM | POA: Diagnosis not present

## 2017-09-29 DIAGNOSIS — L603 Nail dystrophy: Secondary | ICD-10-CM | POA: Diagnosis not present

## 2017-09-29 NOTE — Progress Notes (Signed)
Subjective: Romana Deaton is a 36 y.o.  female returns to office today for follow up evaluation after having right Hallux lateral partial nail avulsion performed. Patient has been soaking using epsom salts but she has not been applying topical antibiotic.  She states that the area is not causing any issues and is not swelling or drainage and it feels good. She has not yet started the Lamisil patient denies fevers, chills, nausea, vomiting. Denies any calf pain, chest pain, SOB.   Objective:  Vitals: Reviewed  General: Well developed, nourished, in no acute distress, alert and oriented x3   Dermatology: Skin is warm, dry and supple bilateral.  Right hallux nail border appears to be clean, dry, with mild granular tissue and surrounding scab.  The nail is hypertrophic, dystrophic, discolored with yellow-brown discoloration.  There is no surrounding erythema, edema, drainage/purulence. The remaining nails appear unremarkable at this time. There are no other lesions or other signs of infection present.  Neurovascular status: Intact. No lower extremity swelling; No pain with calf compression bilateral.  Musculoskeletal: Decreased tenderness to palpation of the lateral hallux nail fold. Muscular strength within normal limits bilateral.   Assesement and Plan: S/p partial nail avulsion, doing well; onychomycosis  -Continue soaking in epsom salts twice a day followed by antibiotic ointment and a band-aid. Can leave uncovered at night. Continue this until completely healed.  -If the area has not healed in 2 weeks, call the office for follow-up appointment, or sooner if any problems arise.  -There is onychomycosis she is going to start Lamisil tomorrow.  A wound recheck blood work in about 1 month and I gave her paperwork to get this done including a CBC and LFT.  However I discussed with her that she denies any issues or concerns she is to call the office. -Monitor for any signs/symptoms of infection. Call  the office immediately if any occur or go directly to the emergency room. Call with any questions/concerns.  Celesta Gentile, DPM

## 2017-09-29 NOTE — Patient Instructions (Signed)
Continue soaking in epsom salts twice a day followed by antibiotic ointment and a band-aid. Can leave uncovered at night. Continue this until completely healed.  If the area has not healed in 2 weeks, call the office for follow-up appointment, or sooner if any problems arise.  Monitor for any signs/symptoms of infection. Call the office immediately if any occur or go directly to the emergency room. Call with any questions/concerns.  --------------------------------------------  Terbinafine oral granules What is this medicine? TERBINAFINE (TER bin a feen) is an antifungal medicine. It is used to treat certain kinds of fungal or yeast infections. This medicine may be used for other purposes; ask your health care provider or pharmacist if you have questions. COMMON BRAND NAME(S): Lamisil What should I tell my health care provider before I take this medicine? They need to know if you have any of these conditions: -drink alcoholic beverages -kidney disease -liver disease -an unusual or allergic reaction to Terbinafine, other medicines, foods, dyes, or preservatives -pregnant or trying to get pregnant -breast-feeding How should I use this medicine? Take this medicine by mouth. Follow the directions on the prescription label. Hold packet with cut line on top. Shake packet gently to settle contents. Tear packet open along cut line, or use scissors to cut across line. Carefully pour the entire contents of packet onto a spoonful of a soft food, such as pudding or other soft, non-acidic food such as mashed potatoes (do NOT use applesauce or a fruit-based food). If two packets are required for each dose, you may either sprinkle the content of both packets on one spoonful of non-acidic food, or sprinkle the contents of both packets on two spoonfuls of non-acidic food. Make sure that no granules remain in the packet. Swallow the mxiture of the food and granules without chewing. Take your medicine at regular  intervals. Do not take it more often than directed. Take all of your medicine as directed even if you think you are better. Do not skip doses or stop your medicine early. Contact your pediatrician or health care professional regarding the use of this medicine in children. While this medicine may be prescribed for children as young as 4 years for selected conditions, precautions do apply. Overdosage: If you think you have taken too much of this medicine contact a poison control center or emergency room at once. NOTE: This medicine is only for you. Do not share this medicine with others. What if I miss a dose? If you miss a dose, take it as soon as you can. If it is almost time for your next dose, take only that dose. Do not take double or extra doses. What may interact with this medicine? Do not take this medicine with any of the following medications: -thioridazine This medicine may also interact with the following medications: -beta-blockers -caffeine -cimetidine -cyclosporine -MAOIs like Carbex, Eldepryl, Marplan, Nardil, and Parnate -medicines for fungal infections like fluconazole and ketoconazole -medicines for irregular heartbeat like amiodarone, flecainide and propafenone -rifampin -SSRIs like citalopram, escitalopram, fluoxetine, fluvoxamine, paroxetine and sertraline -tricyclic antidepressants like amitriptyline, clomipramine, desipramine, imipramine, nortriptyline, and others -warfarin This list may not describe all possible interactions. Give your health care provider a list of all the medicines, herbs, non-prescription drugs, or dietary supplements you use. Also tell them if you smoke, drink alcohol, or use illegal drugs. Some items may interact with your medicine. What should I watch for while using this medicine? Your doctor may monitor your liver function. Tell your doctor right away  if you have nausea or vomiting, loss of appetite, stomach pain on your right upper side, yellow  skin, dark urine, light stools, or are over tired. You need to take this medicine for 6 weeks or longer to cure the fungal infection. Take your medicine regularly for as long as your doctor or health care professional tells you to. What side effects may I notice from receiving this medicine? Side effects that you should report to your doctor or health care professional as soon as possible: -allergic reactions like skin rash or hives, swelling of the face, lips, or tongue -change in vision -dark urine -fever or infection -general ill feeling or flu-like symptoms -light-colored stools -loss of appetite, nausea -redness, blistering, peeling or loosening of the skin, including inside the mouth -right upper belly pain -unusually weak or tired -yellowing of the eyes or skin Side effects that usually do not require medical attention (report to your doctor or health care professional if they continue or are bothersome): -changes in taste -diarrhea -hair loss -muscle or joint pain -stomach upset This list may not describe all possible side effects. Call your doctor for medical advice about side effects. You may report side effects to FDA at 1-800-FDA-1088. Where should I keep my medicine? Keep out of the reach of children. Store at room temperature between 15 and 30 degrees C (59 and 86 degrees F). Throw away any unused medicine after the expiration date. NOTE: This sheet is a summary. It may not cover all possible information. If you have questions about this medicine, talk to your doctor, pharmacist, or health care provider.  2018 Elsevier/Gold Standard (2007-12-10 17:25:48)

## 2017-10-13 NOTE — Progress Notes (Addendum)
Angel Dawson at [facility] [place], [place], [place] [address] [phone]  Date:  10/15/2017   Name:  Angel Dawson   DOB:  September 07, 1981   MRN:  [id number]  PCP:  Darreld Mclean, MD    Chief Complaint: Annual Exam (Pt here for CPE with fasting labs. ) and Medication Refill (Pt request 90 day supply on Canagliflozin, Carvedilol, Fluticasone, Glucose, Lisinopril-HCTZ, and Metformin all sent to [facility])   History of Present Illness:  Angel Dawson is a 37 y.o. very pleasant female patient who presents with the following:  Here today for a CPE History of GERD, DM, HTN, obesity, gallstone pancreatitis s/p cholecystectomy, OSA  She sees DR. Dwyane Dee for her DM management Pap: she sees Dr. Charlesetta Garibaldi at [facility], last pap within the last 3 years, she is scheduled for next week.  She is s/p ablation.  No bleeding since then. G0 Flu: done  Tetanus: 2014 Labs: due for lipids. A1c, CMP and CBC are UTD   She is fasting today for labs  Lab Results  Component Value Date   HGBA1C 6.3 09/16/2017   BP Readings from Last 3 Encounters:  10/15/17 130/84  09/18/17 128/78  06/17/17 (!) 146/94   Wt Readings from Last 3 Encounters:  10/15/17 (!) 406 lb 9.6 oz (184.4 kg)  06/17/17 (!) 410 lb (186 kg)  06/12/17 (!) 412 lb (186.9 kg)   She is working on losing weight gradually  She is pleased with her progress so far She does check her BP at home with her wrist cuff-  She has noted some pressures in the 110s/70s.  She is about to start on CPAP and wonders if her BP will drop more  She has OSA but has not gotten her machine yet   She had been on coreg for many years, but she is not sure of any particular reason why she is taking it Pulse Readings from Last 3 Encounters:  10/15/17 95  09/18/17 83  06/17/17 93    She is up on her optho exam,she sees a retinal specialist Foot exam is UTD She is working on weight loss through  diet change and portion control   She plans to start a monitored exercise program for diabetics and needs me to sign a form for her She has not CP or SOB with climbing to her 3rd floor apt or other activities  Patient Active Problem List   Diagnosis Date Noted  . Onychomycosis 10/26/2014  . Visit for preventive health examination 10/26/2014  . Snoring 10/04/2014  . Gastroesophageal reflux disease without esophagitis 09/28/2014  . Diabetes mellitus type II, uncontrolled ([place]) 09/28/2014  . Hypertension 08/25/2014  . Morbid obesity ([place]) 08/25/2014  . Chronic rhinitis 06/30/2014  . Pancreatitis, acute 07/02/2013  . Gallstones 04/13/2013    Past Medical History:  Diagnosis Date  . Abdominal pain    related to gallstones  . Achilles tendonitis, bilateral   . Bronchitis    02/17  . Diabetes ([place])   . DKA (diabetic ketoacidoses) ([place]) 08/25/2014  . Gallstones   . GERD (gastroesophageal reflux disease)   . Headache   . Hypertension   . Lattice degeneration, both eyes   . Pancreatitis   . PCOS (polycystic ovarian syndrome)   . Sinus congestion    occ. uses OTC meds for this    Past Surgical History:  Procedure Laterality Date  . CHOLECYSTECTOMY N/A 06/22/2013   Procedure:  LAPAROSCOPIC CHOLECYSTECTOMY ;  Surgeon: Adin Hector, MD;  Location: [facility];  Service: General;  Laterality: N/A;  . DILITATION & CURRETTAGE/HYSTROSCOPY WITH HYDROTHERMAL ABLATION N/A 01/03/2016   Procedure: DILATATION & CURETTAGE/HYSTEROSCOPY WITH HYDROTHERMAL ABLATION;  Surgeon: Crawford Givens, MD;  Location: [facility];  Service: Gynecology;  Laterality: N/A;  HTA rep will be attend confirmed by office 12/12/15   . TONSILLECTOMY      Social History   Tobacco Use  . Smoking status: Never Smoker  . Smokeless tobacco: Never Used  Substance Use Topics  . Alcohol use: Yes    Alcohol/week: 0.0 oz    Comment: 1 drink per month  . Drug use: No    Family History  Problem Relation Age of Onset  .  Hypertension Mother   . Diabetes Mother   . Goiter Mother   . Hypertension Father   . Diabetes Father   . Asthma Father   . Stroke Paternal Grandfather   . Lung cancer Paternal Grandmother        smoker-heavy  . Diabetes Maternal Aunt   . Colon cancer Neg Hx   . Heart disease Neg Hx     No Known Allergies  Medication list has been reviewed and updated.  Current Outpatient Medications on File Prior to Visit  Medication Sig Dispense Refill  . aspirin-acetaminophen-caffeine (EXCEDRIN MIGRAINE) 250-250-65 MG tablet Take 2 tablets by mouth as needed for headache.    . Aspirin-Caffeine (BAYER BACK & BODY) 500-32.5 MG TABS Take 2 tablets by mouth as needed.    . Blood Glucose Monitoring Suppl (BLOOD GLUCOSE METER KIT AND SUPPLIES) Dispense based on patient and insurance preference. Use up to four times daily as directed. (FOR ICD-9 250.00, 250.01). 1 each 0  . Continuous Blood Gluc Receiver (FREESTYLE LIBRE READER) DEVI Inject 1 Device into the skin as needed. Use sensor every 10 days. 1 Device 1  . Continuous Blood Gluc Sensor (FREESTYLE LIBRE SENSOR SYSTEM) MISC Use one sensor every 10 days. 3 each 3  . meloxicam (MOBIC) 15 MG tablet Take 1 tablet (15 mg total) by mouth daily. 30 tablet 2  . omeprazole (PRILOSEC) 20 MG capsule TAKE 1 CAPSULE DAILY (SCHEDULE FOLLOW UP APPOINTMENT) 90 capsule 3  . ONETOUCH DELICA LANCETS 04V MISC Use once daily to check glucose. 100 each 1  . OVER THE COUNTER MEDICATION One a Day Women's Vitamin-Take 1 tablet by mouth daily.    . Probiotic Product (PROBIOTIC-10 PO) Take 1 capsule by mouth daily. Takes Fortify Women's Digestive Probiotic    . terbinafine (LAMISIL) 250 MG tablet Take 1 tablet (250 mg total) by mouth daily. 90 tablet 0   No current facility-administered medications on file prior to visit.     Review of Systems:  As per HPI- otherwise negative.   Physical Examination: Vitals:   10/15/17 0918  BP: 130/84  Pulse: 95  Temp: 98.6 F (37  C)  SpO2: 98%   Vitals:   10/15/17 0918  Weight: (!) 406 lb 9.6 oz (184.4 kg)  Height: _0  (1.803 m)   Body mass index is 56.71 kg/m. Ideal Body Weight: Weight in (lb) to have BMI = 25: 178.9  GEN: WDWN, NAD, Non-toxic, A & O x 3, very obese but otherwise looks well  HEENT: Atraumatic, Normocephalic. Neck supple. No masses, No LAD.  Bilateral TM wnl, oropharynx normal.  PEERL,EOMI.   Ears and Nose: No external deformity. CV: RRR, No M/G/R. No JVD. No thrill. No extra heart sounds.  PULM: CTA B, no wheezes, crackles, rhonchi. No retractions. No resp. distress. No accessory muscle use. ABD: S, NT, ND, +BS. No rebound. No HSM. EXTR: No c/c/e NEURO Normal gait.  PSYCH: Normally interactive. Conversant. Not depressed or anxious appearing.  Calm demeanor.  Right great toe with onychomycosis, recently had an ingrown treated by Jacqualyn Posey- this is healing well    Assessment and Plan: Physical exam  Controlled type 2 diabetes mellitus with other specified complication, without long-term current use of insulin ([place]) - Plan: canagliflozin (INVOKANA) 300 MG TABS tablet, glucose blood (ONE TOUCH ULTRA TEST) test strip, metFORMIN (GLUCOPHAGE) 500 MG tablet, Lipid panel  Essential hypertension, benign - Plan: carvedilol (COREG) 25 MG tablet, lisinopril-hydrochlorothiazide (PRINZIDE,ZESTORETIC) 10-12.5 MG tablet  Morbid obesity ([facility]) - Plan: Lipid panel  Allergic rhinitis due to other allergic trigger, unspecified seasonality - Plan: fluticasone (FLONASE) 50 MCG/ACT nasal spray  CPE today She has lost a few pounds- congratulations and keep it up! Refilled her medications as above She has noted some lower BP readings at home and some lightheadedness when she stands. Will decrease her lisinopril/ hctz dose, esp as she will soon be starting CPAP. She will continue to monitor her BP and let me know if running too high or too low  Will plan further follow- up pending labs. Signed form for her to  start supervised exercise program through [place], MD  Received her labs  Results for orders placed or performed in visit on 10/15/17  Lipid panel  Result Value Ref Range   Cholesterol 156 0 - 200 mg/dL   Triglycerides 143.0 0.0 - 149.0 mg/dL   HDL 38.00 (L) >39.00 mg/dL   VLDL 28.6 0.0 - 40.0 mg/dL   LDL Cholesterol 90 0 - 99 mg/dL   Total CHOL/HDL Ratio 4    NonHDL 118.45    Message to pt  Your cholesterol looks good overall!  I would like to see your HDL higher and exercise may help here.  An omega 3 supplement can also be helpful

## 2017-10-15 ENCOUNTER — Encounter: Payer: Self-pay | Admitting: Family Medicine

## 2017-10-15 ENCOUNTER — Ambulatory Visit (INDEPENDENT_AMBULATORY_CARE_PROVIDER_SITE_OTHER): Payer: BLUE CROSS/BLUE SHIELD | Admitting: Family Medicine

## 2017-10-15 VITALS — BP 130/84 | HR 95 | Temp 98.6°F | Ht 71.0 in | Wt >= 6400 oz

## 2017-10-15 DIAGNOSIS — E1169 Type 2 diabetes mellitus with other specified complication: Secondary | ICD-10-CM

## 2017-10-15 DIAGNOSIS — I10 Essential (primary) hypertension: Secondary | ICD-10-CM | POA: Diagnosis not present

## 2017-10-15 DIAGNOSIS — Z0000 Encounter for general adult medical examination without abnormal findings: Secondary | ICD-10-CM | POA: Diagnosis not present

## 2017-10-15 DIAGNOSIS — J3089 Other allergic rhinitis: Secondary | ICD-10-CM

## 2017-10-15 LAB — LIPID PANEL
Cholesterol: 156 mg/dL (ref 0–200)
HDL: 38 mg/dL — AB (ref >39.00)
LDL Cholesterol: 90 mg/dL (ref 0–99)
NonHDL: 118.45
Total CHOL/HDL Ratio: 4
Triglycerides: 143 mg/dL (ref 0.0–149.0)
VLDL: 28.6 mg/dL (ref 0.0–40.0)

## 2017-10-15 MED ORDER — CARVEDILOL 25 MG PO TABS: ORAL_TABLET | ORAL | 3 refills | Status: DC

## 2017-10-15 MED ORDER — FLUTICASONE PROPIONATE 50 MCG/ACT NA SUSP: NASAL | 11 refills | Status: DC

## 2017-10-15 MED ORDER — METFORMIN HCL 500 MG PO TABS: 500.0000 mg | ORAL_TABLET | Freq: Two times a day (BID) | ORAL | 1 refills | Status: DC

## 2017-10-15 MED ORDER — LISINOPRIL-HYDROCHLOROTHIAZIDE 10-12.5 MG PO TABS
1.0000 | ORAL_TABLET | Freq: Every day | ORAL | 3 refills | Status: DC
Start: 2017-10-15 — End: 2018-11-11

## 2017-10-15 MED ORDER — CANAGLIFLOZIN 300 MG PO TABS: 300.0000 mg | ORAL_TABLET | Freq: Every day | ORAL | 1 refills | Status: DC

## 2017-10-15 MED ORDER — GLUCOSE BLOOD VI STRP: ORAL_STRIP | 0 refills | Status: DC

## 2017-10-15 NOTE — Patient Instructions (Addendum)
It was good to see you today- take care and I will be in touch with your cholesterol results We decreased your lisinopril/ hctz from 20/25 to 10/12.5 as your BP is running low.  Continue to monitor this as your BP may drop once you start your CPAP treatment for sleep apnea  Continue your weight loss efforts- great job so far!    Please see me in about 6 months and take care  Health Maintenance, Female Adopting a healthy lifestyle and getting preventive care can go a long way to promote health and wellness. Talk with your health care provider about what schedule of regular examinations is right for you. This is a good chance for you to check in with your provider about disease prevention and staying healthy. In between checkups, there are plenty of things you can do on your own. Experts have done a lot of research about which lifestyle changes and preventive measures are most likely to keep you healthy. Ask your health care provider for more information. Weight and diet Eat a healthy diet  Be sure to include plenty of vegetables, fruits, low-fat dairy products, and lean protein.  Do not eat a lot of foods high in solid fats, added sugars, or salt.  Get regular exercise. This is one of the most important things you can do for your health. ? Most adults should exercise for at least 150 minutes each week. The exercise should increase your heart rate and make you sweat (moderate-intensity exercise). ? Most adults should also do strengthening exercises at least twice a week. This is in addition to the moderate-intensity exercise.  Maintain a healthy weight  Body mass index (BMI) is a measurement that can be used to identify possible weight problems. It estimates body fat based on height and weight. Your health care provider can help determine your BMI and help you achieve or maintain a healthy weight.  For females 47 years of age and older: ? A BMI below 18.5 is considered underweight. ? A BMI  of 18.5 to 24.9 is normal. ? A BMI of 25 to 29.9 is considered overweight. ? A BMI of 30 and above is considered obese.  Watch levels of cholesterol and blood lipids  You should start having your blood tested for lipids and cholesterol at 39 years of age, then have this test every 5 years.  You may need to have your cholesterol levels checked more often if: ? Your lipid or cholesterol levels are high. ? You are older than 39 years of age. ? You are at high risk for heart disease.  Cancer screening Lung Cancer  Lung cancer screening is recommended for adults 25-83 years old who are at high risk for lung cancer because of a history of smoking.  A yearly low-dose CT scan of the lungs is recommended for people who: ? Currently smoke. ? Have quit within the past 15 years. ? Have at least a 30-pack-year history of smoking. A pack year is smoking an average of one pack of cigarettes a day for 1 year.  Yearly screening should continue until it has been 15 years since you quit.  Yearly screening should stop if you develop a health problem that would prevent you from having lung cancer treatment.  Breast Cancer  Practice breast self-awareness. This means understanding how your breasts normally appear and feel.  It also means doing regular breast self-exams. Let your health care provider know about any changes, no matter how small.  If  you are in your 20s or 30s, you should have a clinical breast exam (CBE) by a health care provider every 1-3 years as part of a regular health exam.  If you are 65 or older, have a CBE every year. Also consider having a breast X-ray (mammogram) every year.  If you have a family history of breast cancer, talk to your health care provider about genetic screening.  If you are at high risk for breast cancer, talk to your health care provider about having an MRI and a mammogram every year.  Breast cancer gene (BRCA) assessment is recommended for women who have  family members with BRCA-related cancers. BRCA-related cancers include: ? Breast. ? Ovarian. ? Tubal. ? Peritoneal cancers.  Results of the assessment will determine the need for genetic counseling and BRCA1 and BRCA2 testing.  Cervical Cancer Your health care provider may recommend that you be screened regularly for cancer of the pelvic organs (ovaries, uterus, and vagina). This screening involves a pelvic examination, including checking for microscopic changes to the surface of your cervix (Pap test). You may be encouraged to have this screening done every 3 years, beginning at age 39.  For women ages 64-65, health care providers may recommend pelvic exams and Pap testing every 3 years, or they may recommend the Pap and pelvic exam, combined with testing for human papilloma virus (HPV), every 5 years. Some types of HPV increase your risk of cervical cancer. Testing for HPV may also be done on women of any age with unclear Pap test results.  Other health care providers may not recommend any screening for nonpregnant women who are considered low risk for pelvic cancer and who do not have symptoms. Ask your health care provider if a screening pelvic exam is right for you.  If you have had past treatment for cervical cancer or a condition that could lead to cancer, you need Pap tests and screening for cancer for at least 20 years after your treatment. If Pap tests have been discontinued, your risk factors (such as having a new sexual partner) need to be reassessed to determine if screening should resume. Some women have medical problems that increase the chance of getting cervical cancer. In these cases, your health care provider may recommend more frequent screening and Pap tests.  Colorectal Cancer  This type of cancer can be detected and often prevented.  Routine colorectal cancer screening usually begins at 39 years of age and continues through 39 years of age.  Your health care provider may  recommend screening at an earlier age if you have risk factors for colon cancer.  Your health care provider may also recommend using home test kits to check for hidden blood in the stool.  A small camera at the end of a tube can be used to examine your colon directly (sigmoidoscopy or colonoscopy). This is done to check for the earliest forms of colorectal cancer.  Routine screening usually begins at age 12.  Direct examination of the colon should be repeated every 5-10 years through 39 years of age. However, you may need to be screened more often if early forms of precancerous polyps or small growths are found.  Skin Cancer  Check your skin from head to toe regularly.  Tell your health care provider about any new moles or changes in moles, especially if there is a change in a mole's shape or color.  Also tell your health care provider if you have a mole that is larger  than the size of a pencil eraser.  Always use sunscreen. Apply sunscreen liberally and repeatedly throughout the day.  Protect yourself by wearing long sleeves, pants, a wide-brimmed hat, and sunglasses whenever you are outside.  Heart disease, diabetes, and high blood pressure  High blood pressure causes heart disease and increases the risk of stroke. High blood pressure is more likely to develop in: ? People who have blood pressure in the high end of the normal range (130-139/85-89 mm Hg). ? People who are overweight or obese. ? People who are African American.  If you are 9-14 years of age, have your blood pressure checked every 3-5 years. If you are 67 years of age or older, have your blood pressure checked every year. You should have your blood pressure measured twice-once when you are at a hospital or clinic, and once when you are not at a hospital or clinic. Record the average of the two measurements. To check your blood pressure when you are not at a hospital or clinic, you can use: ? An automated blood pressure  machine at a pharmacy. ? A home blood pressure monitor.  If you are between 31 years and 18 years old, ask your health care provider if you should take aspirin to prevent strokes.  Have regular diabetes screenings. This involves taking a blood sample to check your fasting blood sugar level. ? If you are at a normal weight and have a low risk for diabetes, have this test once every three years after 39 years of age. ? If you are overweight and have a high risk for diabetes, consider being tested at a younger age or more often. Preventing infection Hepatitis B  If you have a higher risk for hepatitis B, you should be screened for this virus. You are considered at high risk for hepatitis B if: ? You were born in a country where hepatitis B is common. Ask your health care provider which countries are considered high risk. ? Your parents were born in a high-risk country, and you have not been immunized against hepatitis B (hepatitis B vaccine). ? You have HIV or AIDS. ? You use needles to inject street drugs. ? You live with someone who has hepatitis B. ? You have had sex with someone who has hepatitis B. ? You get hemodialysis treatment. ? You take certain medicines for conditions, including cancer, organ transplantation, and autoimmune conditions.  Hepatitis C  Blood testing is recommended for: ? Everyone born from [birth year] through 1967. ? Anyone with known risk factors for hepatitis C.  Sexually transmitted infections (STIs)  You should be screened for sexually transmitted infections (STIs) including gonorrhea and chlamydia if: ? You are sexually active and are younger than 39 years of age. ? You are older than 39 years of age and your health care provider tells you that you are at risk for this type of infection. ? Your sexual activity has changed since you were last screened and you are at an increased risk for chlamydia or gonorrhea. Ask your health care provider if you are at  risk.  If you do not have HIV, but are at risk, it may be recommended that you take a prescription medicine daily to prevent HIV infection. This is called pre-exposure prophylaxis (PrEP). You are considered at risk if: ? You are sexually active and do not regularly use condoms or know the HIV status of your partner(s). ? You take drugs by injection. ? You are sexually active with  a partner who has HIV.  Talk with your health care provider about whether you are at high risk of being infected with HIV. If you choose to begin PrEP, you should first be tested for HIV. You should then be tested every 3 months for as long as you are taking PrEP. Pregnancy  If you are premenopausal and you may become pregnant, ask your health care provider about preconception counseling.  If you may become pregnant, take 400 to 800 micrograms (mcg) of folic acid every day.  If you want to prevent pregnancy, talk to your health care provider about birth control (contraception). Osteoporosis and menopause  Osteoporosis is a disease in which the bones lose minerals and strength with aging. This can result in serious bone fractures. Your risk for osteoporosis can be identified using a bone density scan.  If you are 23 years of age or older, or if you are at risk for osteoporosis and fractures, ask your health care provider if you should be screened.  Ask your health care provider whether you should take a calcium or vitamin D supplement to lower your risk for osteoporosis.  Menopause may have certain physical symptoms and risks.  Hormone replacement therapy may reduce some of these symptoms and risks. Talk to your health care provider about whether hormone replacement therapy is right for you. Follow these instructions at home:  Schedule regular health, dental, and eye exams.  Stay current with your immunizations.  Do not use any tobacco products including cigarettes, chewing tobacco, or electronic  cigarettes.  If you are pregnant, do not drink alcohol.  If you are breastfeeding, limit how much and how often you drink alcohol.  Limit alcohol intake to no more than 1 drink per day for nonpregnant women. One drink equals 12 ounces of beer, 5 ounces of wine, or 1 ounces of hard liquor.  Do not use street drugs.  Do not share needles.  Ask your health care provider for help if you need support or information about quitting drugs.  Tell your health care provider if you often feel depressed.  Tell your health care provider if you have ever been abused or do not feel safe at home. This information is not intended to replace advice given to you by your health care provider. Make sure you discuss any questions you have with your health care provider. Document Released: 04/14/2011 Document Revised: 03/06/2016 Document Reviewed: 07/03/2015 Elsevier Interactive Patient Education  [employer].

## 2017-10-19 DIAGNOSIS — Z3040 Encounter for surveillance of contraceptives, unspecified: Secondary | ICD-10-CM | POA: Diagnosis not present

## 2017-10-19 DIAGNOSIS — R87618 Other abnormal cytological findings on specimens from cervix uteri: Secondary | ICD-10-CM | POA: Diagnosis not present

## 2017-10-19 DIAGNOSIS — Z01419 Encounter for gynecological examination (general) (routine) without abnormal findings: Secondary | ICD-10-CM | POA: Diagnosis not present

## 2017-10-19 DIAGNOSIS — Z124 Encounter for screening for malignant neoplasm of cervix: Secondary | ICD-10-CM | POA: Diagnosis not present

## 2017-10-22 ENCOUNTER — Telehealth: Payer: Self-pay | Admitting: Neurology

## 2017-10-22 NOTE — Telephone Encounter (Signed)
Pt calling to c/a 11/16/17 appt. Said she has left 2 msg for [facility] but has not rec'd a call back. Pt said she has called 6 or 7 times but has not LVM. She will call back to r/s when she gets the cpap   FYI

## 2017-10-22 NOTE — Telephone Encounter (Signed)
I called pt back, advised her that I will give [facility] the number she used to call us with today, and ask them to call her. Pt verbalized understanding.

## 2017-10-29 DIAGNOSIS — G4733 Obstructive sleep apnea (adult) (pediatric): Secondary | ICD-10-CM | POA: Diagnosis not present

## 2017-11-16 ENCOUNTER — Ambulatory Visit: Payer: Self-pay | Admitting: Neurology

## 2017-11-19 DIAGNOSIS — M545 Low back pain: Secondary | ICD-10-CM | POA: Diagnosis not present

## 2017-11-19 DIAGNOSIS — M25512 Pain in left shoulder: Secondary | ICD-10-CM | POA: Diagnosis not present

## 2017-11-29 DIAGNOSIS — G4733 Obstructive sleep apnea (adult) (pediatric): Secondary | ICD-10-CM | POA: Diagnosis not present

## 2017-12-20 NOTE — Progress Notes (Signed)
Patient ID: Angel Dawson, female   DOB: September 03, 1981, 37 y.o.   MRN: [id number]          Reason for Appointment: Follow-up Type 2 Diabetes  Referring physician: [name] [name]   History of Present Illness:          Date of diagnosis of type 2 diabetes mellitus:  2015      Background history:   She had extreme thirst and urination and was admitted to the hospital with blood sugars over 400 but no overt acidosis However she was discharged only on metformin 500 mg twice a day which she has continued Prior to that in 2014 she appears to have had mild hyperglycemia with blood sugars up to 195  Recent history:    Non-insulin hypoglycemic drugs the patient is taking are: Metformin 500 mg twice a day, Invokana 100 mg daily  Her A1c has continued to improve and is now 5.8  Current management, blood sugar patterns and problems identified:  She has no side effects from taking 300 mg Invokana now and this has improved her control  She is however checking blood sugars somewhat erratically and mostly in the morning  Currently trying to improve her diet and is working with a nutrition and wellness program at [employer]  More recently has tried to be eating less carbohydrates but she thinks her blood sugar will go up a lot if she is getting foods like pasta  Still has difficulty losing weight  She is only in the last 2-week starting an exercise program  Side effects from medications have been: Diarrhea from high dose metformin  Glucose monitoring:  done <1 times a day         Glucometer: One Touch     FASTING range 101-153 Nonfasting 96-225 with only 4 readings and only 2 high readings with overall MEDIAN 123  Results as above   Self-care: The diet that the patient has been following is: tries to limit carbs.  Breakfast usually is a protein shake with almond milk              Dietician visit, most recent: 05/2017, previously in classes               Exercise: 3/7 days for the last 2  weeks  Weight history:  Wt Readings from Last 3 Encounters:  12/21/17 (!) 407 lb (184.6 kg)  10/15/17 (!) 406 lb 9.6 oz (184.4 kg)  06/17/17 (!) 410 lb (186 kg)    Glycemic control:   Lab Results  Component Value Date   HGBA1C 5.8 12/21/2017   HGBA1C 6.3 09/16/2017   HGBA1C 7.1 (H) 04/08/2017   Lab Results  Component Value Date   MICROALBUR <0.7 06/12/2017   LDLCALC 90 10/15/2017   CREATININE 0.86 09/16/2017   Lab Results  Component Value Date   MICRALBCREAT 1.0 06/12/2017    Lab Results  Component Value Date   FRUCTOSAMINE 258 06/12/2017   Office Visit on 12/21/2017  Component Date Value Ref Range Status  . Hemoglobin A1C 12/21/2017 5.8   Final      Allergies as of 12/21/2017   No Known Allergies     Medication List        Accurate as of 12/21/17 10:09 AM. Always use your most recent med list.          aspirin-acetaminophen-caffeine 250-250-65 MG tablet Commonly known as:  EXCEDRIN MIGRAINE Take 2 tablets by mouth as needed for headache.   BAYER BACK &  BODY 500-32.5 MG Tabs Generic drug:  Aspirin-Caffeine Take 2 tablets by mouth as needed.   blood glucose meter kit and supplies Dispense based on patient and insurance preference. Use up to four times daily as directed. (FOR ICD-9 250.00, 250.01).   canagliflozin 300 MG Tabs tablet Commonly known as:  INVOKANA Take 1 tablet (300 mg total) by mouth daily before breakfast.   carvedilol 25 MG tablet Commonly known as:  COREG Take 1 tablet twice a day with meals   fluticasone 50 MCG/ACT nasal spray Commonly known as:  FLONASE USE 1 SPRAY IN EACH NOSTRIL EVERY DAY   FREESTYLE LIBRE READER Devi Inject 1 Device into the skin as needed. Use sensor every 10 days.   FREESTYLE LIBRE SENSOR SYSTEM Misc Use one sensor every 10 days.   glucose blood test strip Commonly known as:  ONE TOUCH ULTRA TEST TEST ONCE DAILY   lisinopril-hydrochlorothiazide 10-12.5 MG tablet Commonly known as:   PRINZIDE,ZESTORETIC Take 1 tablet by mouth daily.   meloxicam 15 MG tablet Commonly known as:  MOBIC Take 1 tablet (15 mg total) by mouth daily.   metFORMIN 500 MG tablet Commonly known as:  GLUCOPHAGE Take 1 tablet (500 mg total) by mouth 2 (two) times daily with a meal.   omeprazole 20 MG capsule Commonly known as:  PRILOSEC TAKE 1 CAPSULE DAILY (SCHEDULE FOLLOW UP APPOINTMENT)   ONETOUCH DELICA LANCETS 45Y Misc Use once daily to check glucose.   OVER THE COUNTER MEDICATION One a Day Women's Vitamin-Take 1 tablet by mouth daily.   PROBIOTIC-10 PO Take 1 capsule by mouth daily. Takes Fortify Women's Digestive Probiotic   terbinafine 250 MG tablet Commonly known as:  LAMISIL Take 1 tablet (250 mg total) by mouth daily.       Allergies: No Known Allergies  Past Medical History:  Diagnosis Date  . Abdominal pain    related to gallstones  . Achilles tendonitis, bilateral   . Bronchitis    02/17  . Diabetes ([place])   . DKA (diabetic ketoacidoses) ([place]) 08/25/2014  . Gallstones   . GERD (gastroesophageal reflux disease)   . Headache   . Hypertension   . Lattice degeneration, both eyes   . Pancreatitis   . PCOS (polycystic ovarian syndrome)   . Sinus congestion    occ. uses OTC meds for this    Past Surgical History:  Procedure Laterality Date  . CHOLECYSTECTOMY N/A 06/22/2013   Procedure: LAPAROSCOPIC CHOLECYSTECTOMY ;  Surgeon: Adin Hector, MD;  Location: [facility];  Service: General;  Laterality: N/A;  . DILITATION & CURRETTAGE/HYSTROSCOPY WITH HYDROTHERMAL ABLATION N/A 01/03/2016   Procedure: DILATATION & CURETTAGE/HYSTEROSCOPY WITH HYDROTHERMAL ABLATION;  Surgeon: Crawford Givens, MD;  Location: [facility];  Service: Gynecology;  Laterality: N/A;  HTA rep will be attend confirmed by office 12/12/15   . TONSILLECTOMY      Family History  Problem Relation Age of Onset  . Hypertension Mother   . Diabetes Mother   . Goiter Mother   . Hypertension Father   .  Diabetes Father   . Asthma Father   . Stroke Paternal Grandfather   . Lung cancer Paternal Grandmother        smoker-heavy  . Diabetes Maternal Aunt   . Colon cancer Neg Hx   . Heart disease Neg Hx     Social History:  reports that  has never smoked. she has never used smokeless tobacco. She reports that she drinks alcohol. She reports that she does  not use drugs.   Review of Systems   Lipid history: Has never been on medications    Lab Results  Component Value Date   CHOL 156 10/15/2017   HDL 38.00 (L) 10/15/2017   LDLCALC 90 10/15/2017   LDLDIRECT 87.5 09/22/2014   TRIG 143.0 10/15/2017   CHOLHDL 4 10/15/2017           Hypertension: Present for the last 10 years, monitored by PCP  Blood pressure is better and she has reduced her lisinopril dose to 10 mg   She has been taking meloxicam 15 mg less less frequently     BP Readings from Last 3 Encounters:  12/21/17 110/80  10/15/17 130/84  09/18/17 128/78     Most recent eye exam was 6/18, followed by a retina specialist but no diabetic retinopathy present  Most recent foot exam: 7/18   LABS:  Office Visit on 12/21/2017  Component Date Value Ref Range Status  . Hemoglobin A1C 12/21/2017 5.8   Final    Physical Examination:  BP 110/80 (BP Location: Left Arm, Patient Position: Sitting, Cuff Size: Large)   Pulse 90   Wt (!) 407 lb (184.6 kg)   SpO2 98%   BMI 56.76 kg/m         ASSESSMENT:  Diabetes type 2, with Morbid obesity   See history of present illness for detailed discussion of current diabetes management, blood sugar patterns and problems identified  Her A1c is excellent now at 5.8  With continuing to improve her diet and now starting some exercise along with Invokana her blood sugars are significantly improved She is only losing a few pounds in needs to do better She is asking about getting back on medication but discussed that with her morbid obesity and insulin resistance would not like  to reduce her medications as needed Only taking relatively small dose of metformin because of GI intolerance otherwise    HYPERTENSION: Has better control, benefiting from improved diet and can also  PLAN:     She will check postprandial readings more often  She try to cut back on high carbohydrate meals like pasta and she is aware of this now  Regular exercise, continue to increase this  No change in medications Check chemistry panel today while continuing Invokana  Patient Instructions  Check blood sugars on waking up 2-3/7   Also check blood sugars about 2 hours after a meal and do this after different meals by rotation  Recommended blood sugar levels on waking up is 90-130 and about 2 hours after meal is 130-160  Please bring your blood sugar monitor to each visit, thank you         Elayne Snare 12/21/2017, 10:09 AM   Note: This office note was prepared with Dragon voice recognition system technology. Any transcriptional errors that result from this process are unintentional.

## 2017-12-21 ENCOUNTER — Encounter: Payer: Self-pay | Admitting: Endocrinology

## 2017-12-21 ENCOUNTER — Ambulatory Visit: Payer: BLUE CROSS/BLUE SHIELD | Admitting: Endocrinology

## 2017-12-21 VITALS — BP 110/80 | HR 90 | Wt >= 6400 oz

## 2017-12-21 DIAGNOSIS — E1165 Type 2 diabetes mellitus with hyperglycemia: Secondary | ICD-10-CM

## 2017-12-21 LAB — COMPREHENSIVE METABOLIC PANEL
ALK PHOS: 76 U/L (ref 39–117)
ALT: 12 U/L (ref 0–35)
AST: 13 U/L (ref 0–37)
Albumin: 4.3 g/dL (ref 3.5–5.2)
BILIRUBIN TOTAL: 0.3 mg/dL (ref 0.2–1.2)
BUN: 14 mg/dL (ref 6–23)
CO2: 25 mEq/L (ref 19–32)
CREATININE: 0.98 mg/dL (ref 0.40–1.20)
Calcium: 10.1 mg/dL (ref 8.4–10.5)
Chloride: 104 mEq/L (ref 96–112)
GFR: 81.46 mL/min (ref >60.00)
GLUCOSE: 202 mg/dL — AB (ref 70–99)
Potassium: 3.9 mEq/L (ref 3.5–5.1)
Sodium: 137 mEq/L (ref 135–145)
TOTAL PROTEIN: 7.4 g/dL (ref 6.0–8.3)

## 2017-12-21 LAB — POCT GLYCOSYLATED HEMOGLOBIN (HGB A1C): HEMOGLOBIN A1C: 5.8

## 2017-12-21 NOTE — Patient Instructions (Signed)
Check blood sugars on waking up  2-3/7  Also check blood sugars about 2 hours after a meal and do this after different meals by rotation  Recommended blood sugar levels on waking up is 90-130 and about 2 hours after meal is 130-160  Please bring your blood sugar monitor to each visit, thank you   

## 2017-12-24 ENCOUNTER — Encounter: Payer: Self-pay | Admitting: Neurology

## 2017-12-27 DIAGNOSIS — G4733 Obstructive sleep apnea (adult) (pediatric): Secondary | ICD-10-CM | POA: Diagnosis not present

## 2017-12-28 ENCOUNTER — Telehealth: Payer: Self-pay | Admitting: Neurology

## 2017-12-28 NOTE — Telephone Encounter (Signed)
I called pt, she reports that she has been on her cpap for approximately 2 months now and needs to follow up. Pt is agreeable to an appt on 12/29/17 at 11:15am with Hoyle Sauer, NP. Pt verbalized understanding of appt date and time and to bring her cpap to this appt.

## 2017-12-28 NOTE — Telephone Encounter (Signed)
-----   Message from [place], Generic sent at 12/28/2017 9:49 AM EDT -----    Appointment Request From: Angel Dawson    With Provider: Star Age, MD [[facility]]    Preferred Date Range: 01/04/2018 - 01/08/2018    Preferred Times: Monday Morning, Monday Afternoon, Tuesday Morning, Tuesday Afternoon, Thursday Morning, Thursday Afternoon    Reason for visit: Request an Appointment    Comments:  I need to follow up on the use of the CPAP machine.

## 2017-12-28 NOTE — Progress Notes (Addendum)
[facility]  PATIENT: Angel Dawson DOB: 1981/03/08   REASON FOR VISIT: Follow-up for newly diagnosed obstructive sleep apnea with CPAP HISTORY FROM: Patient    HISTORY OF PRESENT ILLNESS:UPDATE 3/19/2019CM [name], 37 year old female returns for follow-up with newly diagnosed obstructive sleep apnea here for initial CPAP compliance.  She claims she has not had any problems adjusting to the machine.  She also says that frequently she gets up to go to the bathroom and does not put the machine back in place for the extra 30 or 40 minutes that she sleeps.  Compliance data dated 11/25/2017 12/24/2017 shows compliance greater than 4 hours at 66.7%.  Average usage 5 hours 23 mm AHI 1.2.  CPAP set at 7 cm. ESS 10 down from 19.  She returns for reevaluation   05/26/17 SAMs. Angel Dawson is a 37 year old right-handed woman with an underlying medical history of diabetes, history of diabetic ketoacidosis, history of bronchitis last year, reflux disease, hypertension, recurrent headache, PCO S, history of pancreatitis, and morbid obesity, who had a home sleep test about 2-1/2 years ago which showed borderline obstructive sleep apnea with an AHI of 4.4 per hour, average oxygen saturation of 94%, nadir of 83%. Her BMI was 57.6 at the time, weight was 413 pounds. Her current weight is about the same, 412 lb. I reviewed your office note from 04/08/2017. She also reports having had an actual attended sleep study some 10 years ago with inconclusive findings as she was not able to sleep very much. This was done at [facility], she was not able to sleep on her back at the time and remembers that she did not sleep and off. She does sleep mostly on her sides and sometimes on her stomach. She is able to sleep on her back son. She lives alone, she is single, no children, works at [employer] and has a long commute. She has to drive for about an hour and she lives in [place]. She gets sleepy at the wheel. This has  concerned her. She has recently shared a bedroom with a girlfriend of hers and has specifically asked her about snoring and she does not snore very much, makes quiet noises sometimes like a sigh. She has vivid dreams. She dreams during naps as well. She has extended naps sometimes on weekends, has slept for hours at a time. She has experienced intermittent sleep paralysis for years, dating back 20 years even. She recalls being sleepy as a child even. She had her tonsils out when she was 50 and had multiple strep throat episodes with time. She is trying to lose weight and make some lifestyle changes to help her diabetes and her weight. She has given up caffeine. She drinks alcohol very occasionally, maybe once a month or so. She's a nonsmoker. Bedtime is around 10 PM and wakeup time typically without an alarm is around 5 or 5:15 AM but by 11 AM she becomes really sleepy and often will try to nap during her lunch hour. She has to be at work at 53. She leaves the house around 5:45 AM. She has had intermittent restless leg symptoms. She has had leg cramps at night as well. She has woken up with a headache particularly after an extended nap, not so much in the morning, denies nocturia. Mom has sleep apnea, dad is also sleepy during the day but has not been formally diagnosed with any sleep condition. She reports that no matter how long she sleeps she does  not wake up rested, she feels that her sleep is poor quality, has woken herself up with a sense of gasping for air but has not been witnessed to have apneas. Epworth sleepiness score is 19 out of 24 today, fatigue score is 58 out of 63. She denies symptoms of depression.   REVIEW OF SYSTEMS: Full 14 system review of systems performed and notable only for those listed, all others are neg:  Constitutional: neg  Cardiovascular: neg Ear/Nose/Throat: Nasal dryness Skin: neg Eyes: neg Respiratory: neg Gastroitestinal: neg  Hematology/Lymphatic: neg  Endocrine:  neg Musculoskeletal:neg Allergy/Immunology: neg Neurological: neg Psychiatric: neg Sleep : neg   ALLERGIES: No Known Allergies  HOME MEDICATIONS: Outpatient Medications Prior to Visit  Medication Sig Dispense Refill  . aspirin-acetaminophen-caffeine (EXCEDRIN MIGRAINE) 250-250-65 MG tablet Take 2 tablets by mouth as needed for headache.    . Aspirin-Caffeine (BAYER BACK & BODY) 500-32.5 MG TABS Take 2 tablets by mouth as needed.    . Blood Glucose Monitoring Suppl (BLOOD GLUCOSE METER KIT AND SUPPLIES) Dispense based on patient and insurance preference. Use up to four times daily as directed. (FOR ICD-9 250.00, 250.01). 1 each 0  . canagliflozin (INVOKANA) 300 MG TABS tablet Take 1 tablet (300 mg total) by mouth daily before breakfast. 90 tablet 1  . carvedilol (COREG) 25 MG tablet Take 1 tablet twice a day with meals 180 tablet 3  . Continuous Blood Gluc Receiver (FREESTYLE LIBRE READER) DEVI Inject 1 Device into the skin as needed. Use sensor every 10 days. 1 Device 1  . Continuous Blood Gluc Sensor (FREESTYLE LIBRE SENSOR SYSTEM) MISC Use one sensor every 10 days. 3 each 3  . fluticasone (FLONASE) 50 MCG/ACT nasal spray USE 1 SPRAY IN EACH NOSTRIL EVERY DAY 16 g 11  . glucose blood (ONE TOUCH ULTRA TEST) test strip TEST ONCE DAILY 100 each 0  . lisinopril-hydrochlorothiazide (PRINZIDE,ZESTORETIC) 10-12.5 MG tablet Take 1 tablet by mouth daily. 90 tablet 3  . meloxicam (MOBIC) 15 MG tablet Take 1 tablet (15 mg total) by mouth daily. 30 tablet 2  . metFORMIN (GLUCOPHAGE) 500 MG tablet Take 1 tablet (500 mg total) by mouth 2 (two) times daily with a meal. 180 tablet 1  . omeprazole (PRILOSEC) 20 MG capsule TAKE 1 CAPSULE DAILY (SCHEDULE FOLLOW UP APPOINTMENT) 90 capsule 3  . ONETOUCH DELICA LANCETS 94W MISC Use once daily to check glucose. 100 each 1  . OVER THE COUNTER MEDICATION One a Day Women's Vitamin-Take 1 tablet by mouth daily.    . Probiotic Product (PROBIOTIC-10 PO) Take 1  capsule by mouth daily. Takes Fortify Women's Digestive Probiotic    . terbinafine (LAMISIL) 250 MG tablet Take 1 tablet (250 mg total) by mouth daily. 90 tablet 0   No facility-administered medications prior to visit.     PAST MEDICAL HISTORY: Past Medical History:  Diagnosis Date  . Abdominal pain    related to gallstones  . Achilles tendonitis, bilateral   . Bronchitis    02/17  . Diabetes ([place])   . DKA (diabetic ketoacidoses) ([place]) 08/25/2014  . Gallstones   . GERD (gastroesophageal reflux disease)   . Headache   . Hypertension   . Lattice degeneration, both eyes   . Pancreatitis   . PCOS (polycystic ovarian syndrome)   . Sinus congestion    occ. uses OTC meds for this    PAST SURGICAL HISTORY: Past Surgical History:  Procedure Laterality Date  . CHOLECYSTECTOMY N/A 06/22/2013   Procedure: LAPAROSCOPIC CHOLECYSTECTOMY ;  Surgeon: Adin Hector, MD;  Location: [facility];  Service: General;  Laterality: N/A;  . DILITATION & CURRETTAGE/HYSTROSCOPY WITH HYDROTHERMAL ABLATION N/A 01/03/2016   Procedure: DILATATION & CURETTAGE/HYSTEROSCOPY WITH HYDROTHERMAL ABLATION;  Surgeon: Crawford Givens, MD;  Location: [facility];  Service: Gynecology;  Laterality: N/A;  HTA rep will be attend confirmed by office 12/12/15   . TONSILLECTOMY      FAMILY HISTORY: Family History  Problem Relation Age of Onset  . Hypertension Mother   . Diabetes Mother   . Goiter Mother   . Hypertension Father   . Diabetes Father   . Asthma Father   . Stroke Paternal Grandfather   . Lung cancer Paternal Grandmother        smoker-heavy  . Diabetes Maternal Aunt   . Colon cancer Neg Hx   . Heart disease Neg Hx     SOCIAL HISTORY: Social History   Socioeconomic History  . Marital status: Single    Spouse name: Not on file  . Number of children: 0  . Years of education: Not on file  . Highest education level: Not on file  Social Needs  . Financial resource strain: Not on file  . Food insecurity -  worry: Not on file  . Food insecurity - inability: Not on file  . Transportation needs - medical: Not on file  . Transportation needs - non-medical: Not on file  Occupational History  . Occupation: Animal nutritionist: [employer]  Tobacco Use  . Smoking status: Never Smoker  . Smokeless tobacco: Never Used  Substance and Sexual Activity  . Alcohol use: Yes    Alcohol/week: 0.0 oz    Comment: 1 drink per month  . Drug use: No  . Sexual activity: No    Birth control/protection: None  Other Topics Concern  . Not on file  Social History Narrative  . Not on file     PHYSICAL EXAM  Vitals:   12/29/17 1124 12/29/17 1131  BP: (!) 139/99 127/82  Pulse: 76 74  Weight: (!) 402 lb 6.4 oz (182.5 kg)    Body mass index is 56.12 kg/m.  Generalized: Well developed, morbidly female in no acute distress  Head: normocephalic and atraumatic,. Oropharynx benign  Neck: Supple,  Musculoskeletal: No deformity   Neurological examination   Mentation: Alert oriented to time, place, history taking. Attention span and concentration appropriate. Recent and remote memory intact.  Follows all commands speech and language fluent.   Cranial nerve II-XII: Pupils were equal round reactive to light extraocular movements were full, visual field were full on confrontational test. Facial sensation and strength were normal. hearing was intact to finger rubbing bilaterally. Uvula tongue midline. head turning and shoulder shrug were normal and symmetric.Tongue protrusion into cheek strength was normal. Motor: normal bulk and tone, full strength in the BUE, BLE, fine finger movements normal, no pronator drift. No focal weakness Sensory: normal and symmetric to light touch,  Coordination: finger-nose-finger, heel-to-shin bilaterally, no dysmetria Gait and Station: Rising up from seated position without assistance, normal stance,  moderate stride, good arm swing, smooth turning, able to perform  tiptoe, and heel walking without difficulty. Tandem gait is steady  DIAGNOSTIC DATA (LABS, IMAGING, TESTING) - I reviewed patient records, labs, notes, testing and imaging myself where available.  Lab Results  Component Value Date   WBC 10.1 09/23/2017   HGB 13.8 09/23/2017   HCT 41.4 09/23/2017   MCV 84.0 09/23/2017   PLT 337 09/23/2017  Component Value Date/Time   NA 137 12/21/2017 0921   K 3.9 12/21/2017 0921   CL 104 12/21/2017 0921   CO2 25 12/21/2017 0921   GLUCOSE 202 (H) 12/21/2017 0921   BUN 14 12/21/2017 0921   CREATININE 0.98 12/21/2017 0921   CREATININE 1.10 04/13/2013 1748   CALCIUM 10.1 12/21/2017 0921   PROT 7.4 12/21/2017 0921   ALBUMIN 4.3 12/21/2017 0921   AST 13 12/21/2017 0921   ALT 12 12/21/2017 0921   ALKPHOS 76 12/21/2017 0921   BILITOT 0.3 12/21/2017 0921   GFRNONAA >60 12/31/2015 1605   GFRAA >60 12/31/2015 1605   Lab Results  Component Value Date   CHOL 156 10/15/2017   HDL 38.00 (L) 10/15/2017   LDLCALC 90 10/15/2017   LDLDIRECT 87.5 09/22/2014   TRIG 143.0 10/15/2017   CHOLHDL 4 10/15/2017   Lab Results  Component Value Date   HGBA1C 5.8 12/21/2017    Lab Results  Component Value Date   TSH 1.22 06/12/2017      ASSESSMENT AND PLAN  37 y.o. year old female recently diagnosed with obstructive sleep apnea here for initial CPAP compliance. data dated 11/25/2017 12/24/2017 shows compliance greater than 4 hours at 66.7%.  Average usage 5 hours 23 mm AHI 1.2.  CPAP set at 7 cm. ESS 10 down from 19.  CPAP compliance greater than 4 hours at 66.7%, reviewed data with patient needs to be at least 70% Continue same settings May use normal saline drops for dry nose Follow-up for repeat compliance in 3 months. Dennie Bible, [facility], [facility], APRN  [facility] [address] [place], [place] [address] [phone]  I reviewed the above note and documentation by the Nurse Practitioner and agree with the  history, physical exam, assessment and plan as outlined above. I was immediately available for face-to-face consultation. Star Age, MD, PhD [facility])

## 2017-12-29 ENCOUNTER — Encounter: Payer: Self-pay | Admitting: Nurse Practitioner

## 2017-12-29 ENCOUNTER — Ambulatory Visit: Payer: BLUE CROSS/BLUE SHIELD | Admitting: Nurse Practitioner

## 2017-12-29 DIAGNOSIS — G4733 Obstructive sleep apnea (adult) (pediatric): Secondary | ICD-10-CM | POA: Insufficient documentation

## 2017-12-29 DIAGNOSIS — Z9989 Dependence on other enabling machines and devices: Secondary | ICD-10-CM

## 2017-12-29 NOTE — Patient Instructions (Signed)
CPAP compliance greater than 4 hours 66.7% Continue same settings Follow-up in 3 months for repeat compliance

## 2018-01-14 DIAGNOSIS — L03011 Cellulitis of right finger: Secondary | ICD-10-CM | POA: Diagnosis not present

## 2018-01-25 ENCOUNTER — Telehealth: Payer: BLUE CROSS/BLUE SHIELD | Admitting: Family

## 2018-01-25 DIAGNOSIS — B373 Candidiasis of vulva and vagina: Secondary | ICD-10-CM

## 2018-01-25 DIAGNOSIS — B3731 Acute candidiasis of vulva and vagina: Secondary | ICD-10-CM

## 2018-01-25 DIAGNOSIS — B370 Candidal stomatitis: Secondary | ICD-10-CM

## 2018-01-25 MED ORDER — NYSTATIN 100000 UNIT/ML MT SUSP: 5.0000 mL | Freq: Four times a day (QID) | OROMUCOSAL | 0 refills | Status: DC

## 2018-01-25 MED ORDER — FLUCONAZOLE 150 MG PO TABS: 150.0000 mg | ORAL_TABLET | Freq: Once | ORAL | 0 refills | Status: AC

## 2018-01-25 NOTE — Progress Notes (Signed)
Thank you for the details you included in the comment boxes. Those details are very helpful in determining the best course of treatment for you and help [place] to provide the best care. I agree with you that this is likely the case (yeast infection). The Diflucan below can help with the oral and the vaginal itching. I will also send some mouthwash to help.  We are sorry that you are not feeling well. Here is how we plan to help! Based on what you shared with me it looks like you: May have a yeast vaginosis  Vaginosis is an inflammation of the vagina that can result in discharge, itching and pain. The cause is usually a change in the normal balance of vaginal bacteria or an infection. Vaginosis can also result from reduced estrogen levels after menopause.  The most common causes of vaginosis are:   Bacterial vaginosis which results from an overgrowth of one on several organisms that are normally present in your vagina.   Yeast infections which are caused by a naturally occurring fungus called candida.   Vaginal atrophy (atrophic vaginosis) which results from the thinning of the vagina from reduced estrogen levels after menopause.   Trichomoniasis which is caused by a parasite and is commonly transmitted by sexual intercourse.  Factors that increase your risk of developing vaginosis include: Marland Kitchen Medications, such as antibiotics and steroids . Uncontrolled diabetes . Use of hygiene products such as bubble bath, vaginal spray or vaginal deodorant . Douching . Wearing damp or tight-fitting clothing . Using an intrauterine device (IUD) for birth control . Hormonal changes, such as those associated with pregnancy, birth control pills or menopause . Sexual activity . Having a sexually transmitted infection  Your treatment plan is A single Diflucan (fluconazole) 150mg  tablet once.  I have electronically sent this prescription into the pharmacy that you have chosen.  Be sure to take all of the  medication as directed. Stop taking any medication if you develop a rash, tongue swelling or shortness of breath. Mothers who are breast feeding should consider pumping and discarding their breast milk while on these antibiotics. However, there is no consensus that infant exposure at these doses would be harmful.  Remember that medication creams can weaken latex condoms. Marland Kitchen   HOME CARE:  Good hygiene may prevent some types of vaginosis from recurring and may relieve some symptoms:  . Avoid baths, hot tubs and whirlpool spas. Rinse soap from your outer genital area after a shower, and dry the area well to prevent irritation. Don't use scented or harsh soaps, such as those with deodorant or antibacterial action. Marland Kitchen Avoid irritants. These include scented tampons and pads. . Wipe from front to back after using the toilet. Doing so avoids spreading fecal bacteria to your vagina.  Other things that may help prevent vaginosis include:  Marland Kitchen Don't douche. Your vagina doesn't require cleansing other than normal bathing. Repetitive douching disrupts the normal organisms that reside in the vagina and can actually increase your risk of vaginal infection. Douching won't clear up a vaginal infection. . Use a latex condom. Both female and female latex condoms may help you avoid infections spread by sexual contact. . Wear cotton underwear. Also wear pantyhose with a cotton crotch. If you feel comfortable without it, skip wearing underwear to bed. Yeast thrives in [employer] Your symptoms should improve in the next day or two.  GET HELP RIGHT AWAY IF:  . You have pain in your lower abdomen ( pelvic area  or over your ovaries) . You develop nausea or vomiting . You develop a fever . Your discharge changes or worsens . You have persistent pain with intercourse . You develop shortness of breath, a rapid pulse, or you faint.  These symptoms could be signs of problems or infections that need to be evaluated  by a medical provider now.  MAKE SURE YOU    Understand these instructions.  Will watch your condition.  Will get help right away if you are not doing well or get worse.  Your e-visit answers were reviewed by a board certified advanced clinical practitioner to complete your personal care plan. Depending upon the condition, your plan could have included both over the counter or prescription medications. Please review your pharmacy choice to make sure that you have choses a pharmacy that is open for you to pick up any needed prescription, Your safety is important to [place]. If you have drug allergies check your prescription carefully.   You can use MyChart to ask questions about today's visit, request a non-urgent call back, or ask for a work or school excuse for 24 hours related to this e-Visit. If it has been greater than 24 hours you will need to follow up with your provider, or enter a new e-Visit to address those concerns. You will get a MyChart message within the next two days asking about your experience. I hope that your e-visit has been valuable and will speed your recovery.

## 2018-01-27 DIAGNOSIS — G4733 Obstructive sleep apnea (adult) (pediatric): Secondary | ICD-10-CM | POA: Diagnosis not present

## 2018-02-01 ENCOUNTER — Encounter: Payer: Self-pay | Admitting: Neurology

## 2018-02-01 ENCOUNTER — Other Ambulatory Visit: Payer: Self-pay | Admitting: Endocrinology

## 2018-02-23 DIAGNOSIS — J019 Acute sinusitis, unspecified: Secondary | ICD-10-CM | POA: Diagnosis not present

## 2018-03-02 ENCOUNTER — Other Ambulatory Visit: Payer: Self-pay | Admitting: Endocrinology

## 2018-03-17 ENCOUNTER — Telehealth: Payer: BLUE CROSS/BLUE SHIELD | Admitting: Family Medicine

## 2018-03-17 DIAGNOSIS — J329 Chronic sinusitis, unspecified: Secondary | ICD-10-CM

## 2018-03-17 MED ORDER — AMOXICILLIN-POT CLAVULANATE 875-125 MG PO TABS: 1.0000 | ORAL_TABLET | Freq: Two times a day (BID) | ORAL | 0 refills | Status: AC

## 2018-03-17 NOTE — Progress Notes (Signed)

## 2018-03-29 ENCOUNTER — Other Ambulatory Visit: Payer: Self-pay | Admitting: Endocrinology

## 2018-04-06 ENCOUNTER — Other Ambulatory Visit: Payer: Self-pay | Admitting: Family Medicine

## 2018-04-06 ENCOUNTER — Telehealth: Payer: Self-pay | Admitting: Family Medicine

## 2018-04-06 DIAGNOSIS — E1169 Type 2 diabetes mellitus with other specified complication: Secondary | ICD-10-CM

## 2018-04-06 NOTE — Telephone Encounter (Signed)
Copied from [place] [id number]. Topic: Quick Communication - See Telephone Encounter >> Apr 06, 2018 11:47 AM Genella Rife H wrote: CRM for notification. See Telephone encounter for: 04/06/18.  Left message to reschedule per pcp.

## 2018-04-08 DIAGNOSIS — J343 Hypertrophy of nasal turbinates: Secondary | ICD-10-CM | POA: Insufficient documentation

## 2018-04-08 DIAGNOSIS — J310 Chronic rhinitis: Secondary | ICD-10-CM | POA: Diagnosis not present

## 2018-04-14 ENCOUNTER — Ambulatory Visit: Payer: BLUE CROSS/BLUE SHIELD | Admitting: Family Medicine

## 2018-04-14 NOTE — Progress Notes (Deleted)
[facility]  PATIENT: Angel Dawson DOB: 1981/03/08   REASON FOR VISIT: Follow-up for newly diagnosed obstructive sleep apnea with CPAP HISTORY FROM: Patient    HISTORY OF PRESENT ILLNESS:UPDATE 3/19/2019CM Angel Dawson, 37 year old Dawson returns for follow-up with newly diagnosed obstructive sleep apnea here for initial CPAP compliance.  She claims she has not had any problems adjusting to the machine.  She also says that frequently she gets up to go to the bathroom and does not put the machine back in place for the extra 30 or 40 minutes that she sleeps.  Compliance data dated 11/25/2017 12/24/2017 shows compliance greater than 4 hours at 66.7%.  Average usage 5 hours 23 mm AHI 1.2.  CPAP set at 7 cm. ESS 10 down from 19.  She returns for reevaluation   05/26/17 SAMs. Angel Dawson with an underlying medical history of diabetes, history of diabetic ketoacidosis, history of bronchitis last year, reflux disease, hypertension, recurrent headache, PCO S, history of pancreatitis, and morbid obesity, who had a home sleep test about 2-1/2 years ago which showed borderline obstructive sleep apnea with an AHI of 4.4 per hour, average oxygen saturation of 94%, nadir of 83%. Her BMI was 57.6 at the time, weight was 413 pounds. Her current weight is about the same, 412 lb. I reviewed your office note from 04/08/2017. She also reports having had an actual attended sleep study some 10 years ago with inconclusive findings as she was not able to sleep very much. This was done at [facility], she was not able to sleep on her back at the time and remembers that she did not sleep and off. She does sleep mostly on her sides and sometimes on her stomach. She is able to sleep on her back son. She lives alone, she is single, no children, works at [employer] and has a long commute. She has to drive for about an hour and she lives in [place]. She gets sleepy at the wheel. This has  concerned her. She has recently shared a bedroom with a girlfriend of hers and has specifically asked her about snoring and she does not snore very much, makes quiet noises sometimes like a sigh. She has vivid dreams. She dreams during naps as well. She has extended naps sometimes on weekends, has slept for hours at a time. She has experienced intermittent sleep paralysis for years, dating back 20 years even. She recalls being sleepy as a child even. She had her tonsils out when she was Angel and had multiple strep throat episodes with time. She is trying to lose weight and make some lifestyle changes to help her diabetes and her weight. She has given up caffeine. She drinks alcohol very occasionally, maybe once a month or so. She's a nonsmoker. Bedtime is around 10 PM and wakeup time typically without an alarm is around 5 or 5:15 AM but by 11 AM she becomes really sleepy and often will try to nap during her lunch hour. She has to be at work at 53. She leaves the house around 5:45 AM. She has had intermittent restless leg symptoms. She has had leg cramps at night as well. She has woken up with a headache particularly after an extended nap, not so much in the morning, denies nocturia. Mom has sleep apnea, dad is also sleepy during the day but has not been formally diagnosed with any sleep condition. She reports that no matter how long she sleeps she does  not wake up rested, she feels that her sleep is poor quality, has woken herself up with a sense of gasping for air but has not been witnessed to have apneas. Epworth sleepiness score is 19 out of 24 today, fatigue score is 58 out of 63. She denies symptoms of depression.   REVIEW OF SYSTEMS: Full 14 system review of systems performed and notable only for those listed, all others are neg:  Constitutional: neg  Cardiovascular: neg Ear/Nose/Throat: Nasal dryness Skin: neg Eyes: neg Respiratory: neg Gastroitestinal: neg  Hematology/Lymphatic: neg  Endocrine:  neg Musculoskeletal:neg Allergy/Immunology: neg Neurological: neg Psychiatric: neg Sleep : neg   ALLERGIES: No Known Allergies  HOME MEDICATIONS: Outpatient Medications Prior to Visit  Medication Sig Dispense Refill  . aspirin-acetaminophen-caffeine (EXCEDRIN MIGRAINE) 250-250-65 MG tablet Take 2 tablets by mouth as needed for headache.    . Aspirin-Caffeine (BAYER BACK & BODY) 500-32.5 MG TABS Take 2 tablets by mouth as needed.    . Blood Glucose Monitoring Suppl (BLOOD GLUCOSE METER KIT AND SUPPLIES) Dispense based on patient and insurance preference. Use up to four times daily as directed. (FOR ICD-9 250.00, 250.01). 1 each 0  . canagliflozin (INVOKANA) 300 MG TABS tablet Take 1 tablet (300 mg total) by mouth daily before breakfast. 90 tablet 1  . carvedilol (COREG) 25 MG tablet Take 1 tablet twice a day with meals 180 tablet 3  . Continuous Blood Gluc Receiver (FREESTYLE LIBRE READER) DEVI Inject 1 Device into the skin as needed. Use sensor every 10 days. 1 Device 1  . Continuous Blood Gluc Sensor (FREESTYLE LIBRE SENSOR SYSTEM) MISC Use one sensor every 10 days. 3 each 3  . fluticasone (FLONASE) 50 MCG/ACT nasal spray USE 1 SPRAY IN EACH NOSTRIL EVERY DAY 16 g 11  . glucose blood (ONE TOUCH ULTRA TEST) test strip TEST ONCE DAILY 100 each 0  . INVOKANA 300 MG TABS tablet TAKE 1 TABLET(300 MG) BY MOUTH DAILY BEFORE BREAKFAST 30 tablet 3  . lisinopril-hydrochlorothiazide (PRINZIDE,ZESTORETIC) 10-12.5 MG tablet Take 1 tablet by mouth daily. 90 tablet 3  . meloxicam (MOBIC) 15 MG tablet Take 1 tablet (15 mg total) by mouth daily. 30 tablet 2  . metFORMIN (GLUCOPHAGE) 500 MG tablet Take 1 tablet (500 mg total) by mouth 2 (two) times daily with a meal. 180 tablet 1  . nystatin (MYCOSTATIN) 100000 UNIT/ML suspension Take 5 mLs (500,000 Units total) by mouth 4 (four) times daily. Until gone 60 mL 0  . omeprazole (PRILOSEC) 20 MG capsule TAKE 1 CAPSULE DAILY (SCHEDULE FOLLOW UP APPOINTMENT) 90  capsule 3  . ONE TOUCH ULTRA TEST test strip TEST ONCE DAILY AS DIRECTED 30 each 5  . ONETOUCH DELICA LANCETS 98X MISC Use once daily to check glucose. 100 each 1  . OVER THE COUNTER MEDICATION One a Day Women's Vitamin-Take 1 tablet by mouth daily.    . Probiotic Product (PROBIOTIC-10 PO) Take 1 capsule by mouth daily. Takes Fortify Women's Digestive Probiotic     No facility-administered medications prior to visit.     PAST MEDICAL HISTORY: Past Medical History:  Diagnosis Date  . Abdominal pain    related to gallstones  . Achilles tendonitis, bilateral   . Bronchitis    02/17  . Diabetes ([place])   . DKA (diabetic ketoacidoses) ([place]) 08/25/2014  . Gallstones   . GERD (gastroesophageal reflux disease)   . Headache   . Hypertension   . Lattice degeneration, both eyes   . Pancreatitis   . PCOS (  polycystic ovarian syndrome)   . Sinus congestion    occ. uses OTC meds for this    PAST SURGICAL HISTORY: Past Surgical History:  Procedure Laterality Date  . CHOLECYSTECTOMY N/A 06/22/2013   Procedure: LAPAROSCOPIC CHOLECYSTECTOMY ;  Surgeon: Adin Hector, MD;  Location: [facility];  Service: General;  Laterality: N/A;  . DILITATION & CURRETTAGE/HYSTROSCOPY WITH HYDROTHERMAL ABLATION N/A 01/03/2016   Procedure: DILATATION & CURETTAGE/HYSTEROSCOPY WITH HYDROTHERMAL ABLATION;  Surgeon: Crawford Givens, MD;  Location: [facility];  Service: Gynecology;  Laterality: N/A;  HTA rep will be attend confirmed by office 12/12/15   . TONSILLECTOMY      FAMILY HISTORY: Family History  Problem Relation Age of Onset  . Hypertension Mother   . Diabetes Mother   . Goiter Mother   . Hypertension Father   . Diabetes Father   . Asthma Father   . Stroke Paternal Grandfather   . Lung cancer Paternal Grandmother        smoker-heavy  . Diabetes Maternal Aunt   . Colon cancer Neg Hx   . Heart disease Neg Hx     SOCIAL HISTORY: Social History   Socioeconomic History  . Marital status: Single     Spouse name: Not on file  . Number of children: 0  . Years of education: Not on file  . Highest education level: Not on file  Occupational History  . Occupation: Animal nutritionist: [employer]  Social Needs  . Financial resource strain: Not on file  . Food insecurity:    Worry: Not on file    Inability: Not on file  . Transportation needs:    Medical: Not on file    Non-medical: Not on file  Tobacco Use  . Smoking status: Never Smoker  . Smokeless tobacco: Never Used  Substance and Sexual Activity  . Alcohol use: Yes    Alcohol/week: 0.0 oz    Comment: 1 drink per month  . Drug use: No  . Sexual activity: Never    Birth control/protection: None  Lifestyle  . Physical activity:    Days per week: Not on file    Minutes per session: Not on file  . Stress: Not on file  Relationships  . Social connections:    Talks on phone: Not on file    Gets together: Not on file    Attends religious service: Not on file    Active member of club or organization: Not on file    Attends meetings of clubs or organizations: Not on file    Relationship status: Not on file  . Intimate partner violence:    Fear of current or ex partner: Not on file    Emotionally abused: Not on file    Physically abused: Not on file    Forced sexual activity: Not on file  Other Topics Concern  . Not on file  Social History Narrative  . Not on file     PHYSICAL EXAM  There were no vitals filed for this visit. There is no height or weight on file to calculate BMI.  Generalized: Well developed, morbidly Dawson in no acute distress  Head: normocephalic and atraumatic,. Oropharynx benign  Neck: Supple,  Musculoskeletal: No deformity   Neurological examination   Mentation: Alert oriented to time, place, history taking. Attention span and concentration appropriate. Recent and remote memory intact.  Follows all commands speech and language fluent.   Cranial nerve II-XII: Pupils were  equal round reactive  to light extraocular movements were full, visual field were full on confrontational test. Facial sensation and strength were normal. hearing was intact to finger rubbing bilaterally. Uvula tongue midline. head turning and shoulder shrug were normal and symmetric.Tongue protrusion into cheek strength was normal. Motor: normal bulk and tone, full strength in the BUE, BLE, fine finger movements normal, no pronator drift. No focal weakness Sensory: normal and symmetric to light touch,  Coordination: finger-nose-finger, heel-to-shin bilaterally, no dysmetria Gait and Station: Rising up from seated position without assistance, normal stance,  moderate stride, good arm swing, smooth turning, able to perform tiptoe, and heel walking without difficulty. Tandem gait is steady  DIAGNOSTIC DATA (LABS, IMAGING, TESTING) - I reviewed patient records, labs, notes, testing and imaging myself where available.  Lab Results  Component Value Date   WBC 10.1 09/23/2017   HGB 13.8 09/23/2017   HCT 41.4 09/23/2017   MCV 84.0 09/23/2017   PLT 337 09/23/2017      Component Value Date/Time   NA 137 12/21/2017 0921   K 3.9 12/21/2017 0921   CL 104 12/21/2017 0921   CO2 25 12/21/2017 0921   GLUCOSE 202 (H) 12/21/2017 0921   BUN 14 12/21/2017 0921   CREATININE 0.98 12/21/2017 0921   CREATININE 1.10 04/13/2013 1748   CALCIUM 10.1 12/21/2017 0921   PROT 7.4 12/21/2017 0921   ALBUMIN 4.3 12/21/2017 0921   AST 13 12/21/2017 0921   ALT 12 12/21/2017 0921   ALKPHOS 76 12/21/2017 0921   BILITOT 0.3 12/21/2017 0921   GFRNONAA >60 12/31/2015 1605   GFRAA >60 12/31/2015 1605   Lab Results  Component Value Date   CHOL 156 10/15/2017   HDL 38.00 (L) 10/15/2017   LDLCALC 90 10/15/2017   LDLDIRECT 87.5 09/22/2014   TRIG 143.0 10/15/2017   CHOLHDL 4 10/15/2017   Lab Results  Component Value Date   HGBA1C 5.8 12/21/2017    Lab Results  Component Value Date   TSH 1.22 06/12/2017       ASSESSMENT AND PLAN  37 y.o. year old Dawson recently diagnosed with obstructive sleep apnea here for initial CPAP compliance. data dated 11/25/2017 12/24/2017 shows compliance greater than 4 hours at 66.7%.  Average usage 5 hours 23 mm AHI 1.2.  CPAP set at 7 cm. ESS 10 down from 19.  CPAP compliance greater than 4 hours at 66.7%, reviewed data with patient needs to be at least 70% Continue same settings May use normal saline drops for dry nose Follow-up for repeat compliance in 3 months. Dennie Bible, [facility], [facility], APRN  [facility] [address] [place], [place] [address] [phone]

## 2018-04-19 ENCOUNTER — Ambulatory Visit: Payer: BLUE CROSS/BLUE SHIELD | Admitting: Nurse Practitioner

## 2018-04-19 ENCOUNTER — Telehealth: Payer: Self-pay | Admitting: *Deleted

## 2018-04-19 ENCOUNTER — Other Ambulatory Visit: Payer: BLUE CROSS/BLUE SHIELD

## 2018-04-19 NOTE — Telephone Encounter (Signed)
Patient was no show for CPAP follow up with NP today.

## 2018-04-20 ENCOUNTER — Encounter: Payer: Self-pay | Admitting: Nurse Practitioner

## 2018-04-20 ENCOUNTER — Other Ambulatory Visit: Payer: Self-pay

## 2018-04-20 DIAGNOSIS — E1169 Type 2 diabetes mellitus with other specified complication: Secondary | ICD-10-CM

## 2018-04-20 MED ORDER — GLUCOSE BLOOD VI STRP: ORAL_STRIP | 4 refills | Status: DC

## 2018-04-22 ENCOUNTER — Ambulatory Visit: Payer: BLUE CROSS/BLUE SHIELD | Admitting: Endocrinology

## 2018-04-27 ENCOUNTER — Other Ambulatory Visit: Payer: Self-pay

## 2018-05-16 NOTE — Progress Notes (Deleted)
[facility] at [facility] [address], [place], [place] [address] [phone]  Date:  05/20/2018   Name:  Angel Dawson   DOB:  [birth year]   MRN:  [id number]  PCP:  Darreld Mclean, MD    Chief Complaint: No chief complaint on file.   History of Present Illness:  Angel Dawson is a 37 y.o. very pleasant female patient who presents with the following:  Here today for a routine visit- history of OSA, obesity, gerd, DM, HTN, gallstone pancreatitis s/p chole in 2014 Last visit with me for a CPE in January of this year:  She sees DR. Dwyane Dee for her DM management Pap: she sees Dr. Charlesetta Garibaldi at [facility], last pap within the last 3 years, she is scheduled for next week.  She is s/p ablation.  No bleeding since then. G0 Flu: done  Tetanus: 2014 Labs: due for lipids. A1c, CMP and CBC are UTD   Saw Dr. Dwyane Dee in March at which time her A1c looked great Lab Results  Component Value Date   HGBA1C 5.8 12/21/2017   Eye exam: Pap:  Patient Active Problem List   Diagnosis Date Noted  . Obstructive sleep apnea treated with continuous positive airway pressure (CPAP) 12/29/2017  . Onychomycosis 10/26/2014  . Visit for preventive health examination 10/26/2014  . Snoring 10/04/2014  . Gastroesophageal reflux disease without esophagitis 09/28/2014  . Diabetes mellitus type II, uncontrolled ([place]) 09/28/2014  . Hypertension 08/25/2014  . Morbid obesity ([place]) 08/25/2014  . Chronic rhinitis 06/30/2014  . Pancreatitis, acute 07/02/2013  . Gallstones 04/13/2013    Past Medical History:  Diagnosis Date  . Abdominal pain    related to gallstones  . Achilles tendonitis, bilateral   . Bronchitis    02/17  . Diabetes ([place])   . DKA (diabetic ketoacidoses) ([place]) 08/25/2014  . Gallstones   . GERD (gastroesophageal reflux disease)   . Headache   . Hypertension   . Lattice degeneration, both eyes   . Pancreatitis   . PCOS (polycystic  ovarian syndrome)   . Sinus congestion    occ. uses OTC meds for this    Past Surgical History:  Procedure Laterality Date  . CHOLECYSTECTOMY N/A 06/22/2013   Procedure: LAPAROSCOPIC CHOLECYSTECTOMY ;  Surgeon: Adin Hector, MD;  Location: [facility];  Service: General;  Laterality: N/A;  . DILITATION & CURRETTAGE/HYSTROSCOPY WITH HYDROTHERMAL ABLATION N/A 01/03/2016   Procedure: DILATATION & CURETTAGE/HYSTEROSCOPY WITH HYDROTHERMAL ABLATION;  Surgeon: Crawford Givens, MD;  Location: [facility];  Service: Gynecology;  Laterality: N/A;  HTA rep will be attend confirmed by office 12/12/15   . TONSILLECTOMY      Social History   Tobacco Use  . Smoking status: Never Smoker  . Smokeless tobacco: Never Used  Substance Use Topics  . Alcohol use: Yes    Alcohol/week: 0.0 oz    Comment: 1 drink per month  . Drug use: No    Family History  Problem Relation Age of Onset  . Hypertension Mother   . Diabetes Mother   . Goiter Mother   . Hypertension Father   . Diabetes Father   . Asthma Father   . Stroke Paternal Grandfather   . Lung cancer Paternal Grandmother        smoker-heavy  . Diabetes Maternal Aunt   . Colon cancer Neg Hx   . Heart disease Neg Hx     No Known Allergies  Medication list  has been reviewed and updated.  Current Outpatient Medications on File Prior to Visit  Medication Sig Dispense Refill  . aspirin-acetaminophen-caffeine (EXCEDRIN MIGRAINE) 250-250-65 MG tablet Take 2 tablets by mouth as needed for headache.    . Aspirin-Caffeine (BAYER BACK & BODY) 500-32.5 MG TABS Take 2 tablets by mouth as needed.    . Blood Glucose Monitoring Suppl (BLOOD GLUCOSE METER KIT AND SUPPLIES) Dispense based on patient and insurance preference. Use up to four times daily as directed. (FOR ICD-9 250.00, 250.01). 1 each 0  . canagliflozin (INVOKANA) 300 MG TABS tablet Take 1 tablet (300 mg total) by mouth daily before breakfast. 90 tablet 1  . carvedilol (COREG) 25 MG tablet Take 1  tablet twice a day with meals 180 tablet 3  . Continuous Blood Gluc Receiver (FREESTYLE LIBRE READER) DEVI Inject 1 Device into the skin as needed. Use sensor every 10 days. 1 Device 1  . Continuous Blood Gluc Sensor (FREESTYLE LIBRE SENSOR SYSTEM) MISC Use one sensor every 10 days. 3 each 3  . fluticasone (FLONASE) 50 MCG/ACT nasal spray USE 1 SPRAY IN EACH NOSTRIL EVERY DAY 16 g 11  . glucose blood (ONE TOUCH ULTRA TEST) test strip TEST ONCE DAILY 100 each 0  . glucose blood (ONE TOUCH ULTRA TEST) test strip TEST ONCE DAILY AS DIRECTED 300 each 4  . INVOKANA 300 MG TABS tablet TAKE 1 TABLET(300 MG) BY MOUTH DAILY BEFORE BREAKFAST 30 tablet 3  . lisinopril-hydrochlorothiazide (PRINZIDE,ZESTORETIC) 10-12.5 MG tablet Take 1 tablet by mouth daily. 90 tablet 3  . metFORMIN (GLUCOPHAGE) 500 MG tablet Take 1 tablet (500 mg total) by mouth 2 (two) times daily with a meal. 180 tablet 1  . nystatin (MYCOSTATIN) 100000 UNIT/ML suspension Take 5 mLs (500,000 Units total) by mouth 4 (four) times daily. Until gone 60 mL 0  . omeprazole (PRILOSEC) 20 MG capsule TAKE 1 CAPSULE DAILY (SCHEDULE FOLLOW UP APPOINTMENT) 90 capsule 3  . ONETOUCH DELICA LANCETS 51T MISC Use once daily to check glucose. 100 each 1  . OVER THE COUNTER MEDICATION One a Day Women's Vitamin-Take 1 tablet by mouth daily.    . Probiotic Product (PROBIOTIC-10 PO) Take 1 capsule by mouth daily. Takes Fortify Women's Digestive Probiotic     No current facility-administered medications on file prior to visit.     Review of Systems:  As per HPI- otherwise negative.   Physical Examination: There were no vitals filed for this visit. There were no vitals filed for this visit. There is no height or weight on file to calculate BMI. Ideal Body Weight:    GEN: WDWN, NAD, Non-toxic, A & O x 3 HEENT: Atraumatic, Normocephalic. Neck supple. No masses, No LAD. Ears and Nose: No external deformity. CV: RRR, No M/G/R. No JVD. No thrill. No extra  heart sounds. PULM: CTA B, no wheezes, crackles, rhonchi. No retractions. No resp. distress. No accessory muscle use. ABD: S, NT, ND, +BS. No rebound. No HSM. EXTR: No c/c/e NEURO Normal gait.  PSYCH: Normally interactive. Conversant. Not depressed or anxious appearing.  Calm demeanor.    Assessment and Plan: ***  Signed Lamar Blinks, MD

## 2018-05-20 ENCOUNTER — Ambulatory Visit: Payer: BLUE CROSS/BLUE SHIELD | Admitting: Family Medicine

## 2018-05-23 NOTE — Progress Notes (Addendum)
[place] [place], [place], [place] [address] [phone]  Date:  05/27/2018   Name:  Angel Dawson   DOB:  1980-10-25   MRN:  [id number]  PCP:  Darreld Mclean, MD    Chief Complaint: Diabetes (6 month follow up, would like to be taken off coreg, tingling in feet-4 months, worse at night, took self off of invokana)   History of Present Illness:  Angel Dawson is a 37 y.o. very pleasant female patient who presents with the following:  Following up today History of HTN, DM, obesity, OSA I last saw her in January of this year:  History of GERD, DM, HTN, obesity, gallstone pancreatitis s/p cholecystectomy, OSA She sees DR. Dwyane Dee for her DM management Pap: she sees Dr. Charlesetta Garibaldi at [facility], last pap within the last 3 years, she is scheduled for next week.  She is s/p ablation.  No bleeding since then. //////////////// She has lost a few pounds- congratulations and keep it up! Refilled her medications as above She has noted some lower BP readings at home and some lightheadedness when she stands. Will decrease her lisinopril/ hctz dose, esp as she will soon be starting CPAP. She will continue to monitor her BP and let me know if running too high or too low   She is going to neurology for her OSA/ CPAP monitoring  She also saw Dr. Dwyane Dee in March as follows:  Her A1c is excellent now at 5.8 With continuing to improve her diet and now starting some exercise along with Invokana her blood sugars are significantly improved She is only losing a few pounds in needs to do better She is asking about getting back on medication but discussed that with her morbid obesity and insulin resistance would not like to reduce her medications as needed Only taking relatively small dose of metformin because of GI intolerance otherwise HYPERTENSION: Has better control, benefiting from improved diet   She is seeing Dwyane Dee next  week We will draw an A1c for her today so this is done Reminded her to do her eye exam asap She does see Dr Charlesetta Garibaldi for her pap; last done in the last 2 years   Wt Readings from Last 3 Encounters:  05/27/18 (!) 415 lb (188.2 kg)  12/29/17 (!) 402 lb 6.4 oz (182.5 kg)  12/21/17 (!) 407 lb (184.6 kg)   Her family is doing well unfortunately her weight is up a few lbs as above  She noted her feet tingling and hurting at night over the last few months- she got worried that this might be due to her invokanna so she stopped taking it about 2-3 months ago and this has reduced her sx  She is also suffering from a left achilles injury that reduces her ability to exercise  She is being seen by Dr. Jacqualyn Posey for this issue Mentioned weight loss surgery to her. She is not ready to pursue this yet but will think about it  No known cardiac issues We had discussed coming off coreg in the past and she would like to do this She is also on lisinopril/hctz for her BP  Patient Active Problem List   Diagnosis Date Noted  . Obstructive sleep apnea treated with continuous positive airway pressure (CPAP) 12/29/2017  . Onychomycosis 10/26/2014  . Gastroesophageal reflux disease without esophagitis 09/28/2014  . Diabetes mellitus type II, uncontrolled ([place]) 09/28/2014  . Hypertension 08/25/2014  .  Morbid obesity ([place]) 08/25/2014  . Chronic rhinitis 06/30/2014  . Pancreatitis, acute 07/02/2013  . Gallstones 04/13/2013    Past Medical History:  Diagnosis Date  . Abdominal pain    related to gallstones  . Achilles tendonitis, bilateral   . Bronchitis    02/17  . Diabetes ([place])   . DKA (diabetic ketoacidoses) ([place]) 08/25/2014  . Gallstones   . GERD (gastroesophageal reflux disease)   . Headache   . Hypertension   . Lattice degeneration, both eyes   . Pancreatitis   . PCOS (polycystic ovarian syndrome)   . Sinus congestion    occ. uses OTC meds for this    Past Surgical History:  Procedure  Laterality Date  . CHOLECYSTECTOMY N/A 06/22/2013   Procedure: LAPAROSCOPIC CHOLECYSTECTOMY ;  Surgeon: Adin Hector, MD;  Location: [facility];  Service: General;  Laterality: N/A;  . DILITATION & CURRETTAGE/HYSTROSCOPY WITH HYDROTHERMAL ABLATION N/A 01/03/2016   Procedure: DILATATION & CURETTAGE/HYSTEROSCOPY WITH HYDROTHERMAL ABLATION;  Surgeon: Crawford Givens, MD;  Location: [facility];  Service: Gynecology;  Laterality: N/A;  HTA rep will be attend confirmed by office 12/12/15   . TONSILLECTOMY      Social History   Tobacco Use  . Smoking status: Never Smoker  . Smokeless tobacco: Never Used  Substance Use Topics  . Alcohol use: Yes    Alcohol/week: 0.0 standard drinks    Comment: 1 drink per month  . Drug use: No    Family History  Problem Relation Age of Onset  . Hypertension Mother   . Diabetes Mother   . Goiter Mother   . Hypertension Father   . Diabetes Father   . Asthma Father   . Stroke Paternal Grandfather   . Lung cancer Paternal Grandmother        smoker-heavy  . Diabetes Maternal Aunt   . Colon cancer Neg Hx   . Heart disease Neg Hx     No Known Allergies  Medication list has been reviewed and updated.  Current Outpatient Medications on File Prior to Visit  Medication Sig Dispense Refill  . aspirin-acetaminophen-caffeine (EXCEDRIN MIGRAINE) 250-250-65 MG tablet Take 2 tablets by mouth as needed for headache.    . Aspirin-Caffeine (BAYER BACK & BODY) 500-32.5 MG TABS Take 2 tablets by mouth as needed.    . Blood Glucose Monitoring Suppl (BLOOD GLUCOSE METER KIT AND SUPPLIES) Dispense based on patient and insurance preference. Use up to four times daily as directed. (FOR ICD-9 250.00, 250.01). 1 each 0  . Continuous Blood Gluc Receiver (FREESTYLE LIBRE READER) DEVI Inject 1 Device into the skin as needed. Use sensor every 10 days. 1 Device 1  . Continuous Blood Gluc Sensor (FREESTYLE LIBRE SENSOR SYSTEM) MISC Use one sensor every 10 days. 3 each 3  . fluticasone  (FLONASE) 50 MCG/ACT nasal spray USE 1 SPRAY IN EACH NOSTRIL EVERY DAY 16 g 11  . glucose blood (ONE TOUCH ULTRA TEST) test strip TEST ONCE DAILY 100 each 0  . glucose blood (ONE TOUCH ULTRA TEST) test strip TEST ONCE DAILY AS DIRECTED 300 each 4  . lisinopril-hydrochlorothiazide (PRINZIDE,ZESTORETIC) 10-12.5 MG tablet Take 1 tablet by mouth daily. 90 tablet 3  . metFORMIN (GLUCOPHAGE) 500 MG tablet Take 1 tablet (500 mg total) by mouth 2 (two) times daily with a meal. 180 tablet 1  . ONETOUCH DELICA LANCETS 65K MISC Use once daily to check glucose. 100 each 1  . Probiotic Product (PROBIOTIC-10 PO) Take 1 capsule by mouth daily.  Takes Fortify Women's Digestive Probiotic    . carvedilol (COREG) 25 MG tablet Take 1 tablet twice a day with meals (Patient not taking: Reported on 05/27/2018) 180 tablet 3   No current facility-administered medications on file prior to visit.     Review of Systems:  As per HPI- otherwise negative. Her her BP is generally 115/75 at home per her report   Physical Examination: Vitals:   05/27/18 0902  BP: 130/78  Pulse: 87  Resp: 18  Temp: 98.1 F (36.7 C)  SpO2: 98%   Vitals:   05/27/18 0902  Weight: (!) 415 lb (188.2 kg)  Height: 5' 11"  (1.803 m)   Body mass index is 57.88 kg/m. Ideal Body Weight: Weight in (lb) to have BMI = 25: 178.9  GEN: WDWN, NAD, Non-toxic, A & O x 3, obese, looks well  HEENT: Atraumatic, Normocephalic. Neck supple. No masses, No LAD. Ears and Nose: No external deformity. CV: RRR, No M/G/R. No JVD. No thrill. No extra heart sounds. PULM: CTA B, no wheezes, crackles, rhonchi. No retractions. No resp. distress. No accessory muscle use. ABD: S, NT, ND, +BS. No rebound. No HSM. EXTR: No c/c/e NEURO Normal gait.  PSYCH: Normally interactive. Conversant. Not depressed or anxious appearing.  Calm demeanor.    Assessment and Plan: Controlled type 2 diabetes mellitus with other specified complication, without long-term current  use of insulin ([place]) - Plan: Basic metabolic panel, Hemoglobin A1c  Essential hypertension, benign  Morbid obesity ([place])  Check A1c today- she stopped her invokana  BP- she will taper off coreg, will let me know if her BP goes up  Continue to encourage weight loss See patient instructions for more details.   Will plan further follow- up pending labs.   Signed Lamar Blinks, MD  Received her labs, message to pt   Results for orders placed or performed in visit on 03/47/42  Basic metabolic panel  Result Value Ref Range   Sodium 138 135 - 145 mEq/L   Potassium 4.4 3.5 - 5.1 mEq/L   Chloride 104 96 - 112 mEq/L   CO2 28 19 - 32 mEq/L   Glucose, Bld 159 (H) 70 - 99 mg/dL   BUN 13 6 - 23 mg/dL   Creatinine, Ser 0.98 0.40 - 1.20 mg/dL   Calcium 10.1 8.4 - 10.5 mg/dL   GFR 81.27 >60.00 mL/min  Hemoglobin A1c  Result Value Ref Range   Hgb A1c MFr Bld 6.1 4.6 - 6.5 %

## 2018-05-27 ENCOUNTER — Ambulatory Visit: Payer: BLUE CROSS/BLUE SHIELD | Admitting: Family Medicine

## 2018-05-27 ENCOUNTER — Encounter: Payer: Self-pay | Admitting: Family Medicine

## 2018-05-27 VITALS — BP 130/78 | HR 87 | Temp 98.1°F | Resp 18 | Ht 71.0 in | Wt >= 6400 oz

## 2018-05-27 DIAGNOSIS — E1169 Type 2 diabetes mellitus with other specified complication: Secondary | ICD-10-CM

## 2018-05-27 DIAGNOSIS — I10 Essential (primary) hypertension: Secondary | ICD-10-CM

## 2018-05-27 LAB — BASIC METABOLIC PANEL
BUN: 13 mg/dL (ref 6–23)
CHLORIDE: 104 meq/L (ref 96–112)
CO2: 28 mEq/L (ref 19–32)
CREATININE: 0.98 mg/dL (ref 0.40–1.20)
Calcium: 10.1 mg/dL (ref 8.4–10.5)
GFR: 81.27 mL/min (ref >60.00)
Glucose, Bld: 159 mg/dL — ABNORMAL HIGH (ref 70–99)
POTASSIUM: 4.4 meq/L (ref 3.5–5.1)
Sodium: 138 mEq/L (ref 135–145)

## 2018-05-27 LAB — HEMOGLOBIN A1C: HEMOGLOBIN A1C: 6.1 % (ref 4.6–6.5)

## 2018-05-27 NOTE — Patient Instructions (Addendum)
We can have you taper off your coreg-  Take a 1/2 pill or 12.5 mg twice a day for one week, then just once a day for one week, then stop Let me know if your heart rate or BP are going up We will continue your other BP med- the lisinopril/hctz   I will be in touch with your labs asap  Good luck with your ankle - I hope that it soon feels better  Let me know if you do want to look into weight loss surgery at any time

## 2018-05-31 ENCOUNTER — Other Ambulatory Visit: Payer: BLUE CROSS/BLUE SHIELD

## 2018-06-02 DIAGNOSIS — S29012A Strain of muscle and tendon of back wall of thorax, initial encounter: Secondary | ICD-10-CM | POA: Diagnosis not present

## 2018-06-03 ENCOUNTER — Encounter: Payer: Self-pay | Admitting: Endocrinology

## 2018-06-03 ENCOUNTER — Ambulatory Visit: Payer: BLUE CROSS/BLUE SHIELD | Admitting: Endocrinology

## 2018-06-03 VITALS — BP 142/86 | HR 95 | Ht 71.0 in | Wt >= 6400 oz

## 2018-06-03 DIAGNOSIS — E669 Obesity, unspecified: Secondary | ICD-10-CM | POA: Diagnosis not present

## 2018-06-03 DIAGNOSIS — E1169 Type 2 diabetes mellitus with other specified complication: Secondary | ICD-10-CM

## 2018-06-03 MED ORDER — METFORMIN HCL ER 750 MG PO TB24: 750.0000 mg | ORAL_TABLET | Freq: Every day | ORAL | 1 refills | Status: DC

## 2018-06-03 NOTE — Patient Instructions (Signed)
Stay on Coreg

## 2018-06-03 NOTE — Progress Notes (Signed)
Patient ID: Angel Dawson, female   DOB: 09-Jun-1981, 37 y.o.   MRN: [id number]          Reason for Appointment: Follow-up Type 2 Diabetes  Referring physician: [name] [name]   History of Present Illness:          Date of diagnosis of type 2 diabetes mellitus:  2015      Background history:   She had extreme thirst and urination and was admitted to the hospital with blood sugars over 400 but no overt acidosis However she was discharged only on metformin 500 mg twice a day which she has continued Prior to that in 2014 she appears to have had mild hyperglycemia with blood sugars up to 195  Recent history:    Non-insulin hypoglycemic drugs the patient is taking are: Metformin 500 mg twice a day   Her A1c has stayed about the same at 6.1 compared to 5.8  However she has not been seen in follow-up since 12/2017  Current management, blood sugar patterns and problems identified:  She on her own stopped taking Invokana several weeks ago  She thought that it was causing tingling in her feet and legs and was afraid of amputation side effects and did not notify us.  However her blood sugars are not she tends to have high fasting readings  Over the last few weeks also she has not exercise because of various problems and ankle pain  She is now checking her sugars with the freestyle libre system on her own but since it may read relatively low she will periodically check readings with her One Touch meter also  Although her blood sugars are averaging only 110 on her freestyle Libre sensor is averaging 123 on her fingersticks  She has gained weight  Side effects from medications have been: Diarrhea from high dose metformin  Glucose monitoring:  done   times a day         Glucometer:      PRE-MEAL Fasting Lunch Dinner Bedtime Overall  Glucose range:  116-151  97-120     Verio/libre:  146/122     123/110   POST-MEAL PC Breakfast PC Lunch PC Dinner  Glucose range:    111-145    Mean/median:           Self-care: The diet that the patient has been following is: tries to limit carbs.  Breakfast usually is a protein shake with almond milk              Dietician visit, most recent: 05/2017, previously in classes               Exercise: 0/7 days for the last 8 weeks  Weight history:  Wt Readings from Last 3 Encounters:  06/03/18 (!) 411 lb (186.4 kg)  05/27/18 (!) 415 lb (188.2 kg)  12/29/17 (!) 402 lb 6.4 oz (182.5 kg)    Glycemic control:   Lab Results  Component Value Date   HGBA1C 6.1 05/27/2018   HGBA1C 5.8 12/21/2017   HGBA1C 6.3 09/16/2017   Lab Results  Component Value Date   MICROALBUR <0.7 06/12/2017   LDLCALC 90 10/15/2017   CREATININE 0.98 05/27/2018   Lab Results  Component Value Date   MICRALBCREAT 1.0 06/12/2017    Lab Results  Component Value Date   FRUCTOSAMINE 258 06/12/2017   No visits with results within 1 Week(s) from this visit.  Latest known visit with results is:  Office Visit on 05/27/2018  Component  Date Value Ref Range Status  . Sodium 05/27/2018 138  135 - 145 mEq/L Final  . Potassium 05/27/2018 4.4  3.5 - 5.1 mEq/L Final  . Chloride 05/27/2018 104  96 - 112 mEq/L Final  . CO2 05/27/2018 28  19 - 32 mEq/L Final  . Glucose, Bld 05/27/2018 159* 70 - 99 mg/dL Final  . BUN 05/27/2018 13  6 - 23 mg/dL Final  . Creatinine, Ser 05/27/2018 0.98  0.40 - 1.20 mg/dL Final  . Calcium 05/27/2018 10.1  8.4 - 10.5 mg/dL Final  . GFR 05/27/2018 81.27  >60.00 mL/min Final  . Hgb A1c MFr Bld 05/27/2018 6.1  4.6 - 6.5 % Final   Glycemic Control Guidelines for People with Diabetes:Non Diabetic:  <6%Goal of Therapy: <7%Additional Action Suggested:  >8%       Allergies as of 06/03/2018   No Known Allergies     Medication List        Accurate as of 06/03/18  1:27 PM. Always use your most recent med list.          aspirin-acetaminophen-caffeine 250-250-65 MG tablet Commonly known as:  EXCEDRIN MIGRAINE Take 2 tablets  by mouth as needed for headache.   BAYER BACK & BODY 500-32.5 MG Tabs Generic drug:  Aspirin-Caffeine Take 2 tablets by mouth as needed.   blood glucose meter kit and supplies Dispense based on patient and insurance preference. Use up to four times daily as directed. (FOR ICD-9 250.00, 250.01).   fluticasone 50 MCG/ACT nasal spray Commonly known as:  FLONASE USE 1 SPRAY IN EACH NOSTRIL EVERY DAY   FREESTYLE LIBRE READER Devi Inject 1 Device into the skin as needed. Use sensor every 10 days.   FREESTYLE LIBRE SENSOR SYSTEM Misc Use one sensor every 10 days.   glucose blood test strip TEST ONCE DAILY   glucose blood test strip TEST ONCE DAILY AS DIRECTED   lisinopril-hydrochlorothiazide 10-12.5 MG tablet Commonly known as:  PRINZIDE,ZESTORETIC Take 1 tablet by mouth daily.   metFORMIN 500 MG tablet Commonly known as:  GLUCOPHAGE Take 1 tablet (500 mg total) by mouth 2 (two) times daily with a meal.   ONETOUCH DELICA LANCETS 23J Misc Use once daily to check glucose.   PROBIOTIC-10 PO Take 1 capsule by mouth daily. Takes Fortify Women's Digestive Probiotic       Allergies: No Known Allergies  Past Medical History:  Diagnosis Date  . Abdominal pain    related to gallstones  . Achilles tendonitis, bilateral   . Bronchitis    02/17  . Diabetes ([place])   . DKA (diabetic ketoacidoses) ([place]) 08/25/2014  . Gallstones   . GERD (gastroesophageal reflux disease)   . Headache   . Hypertension   . Lattice degeneration, both eyes   . Pancreatitis   . PCOS (polycystic ovarian syndrome)   . Sinus congestion    occ. uses OTC meds for this    Past Surgical History:  Procedure Laterality Date  . CHOLECYSTECTOMY N/A 06/22/2013   Procedure: LAPAROSCOPIC CHOLECYSTECTOMY ;  Surgeon: Adin Hector, MD;  Location: [facility];  Service: General;  Laterality: N/A;  . DILITATION & CURRETTAGE/HYSTROSCOPY WITH HYDROTHERMAL ABLATION N/A 01/03/2016   Procedure: DILATATION &  CURETTAGE/HYSTEROSCOPY WITH HYDROTHERMAL ABLATION;  Surgeon: Crawford Givens, MD;  Location: [facility];  Service: Gynecology;  Laterality: N/A;  HTA rep will be attend confirmed by office 12/12/15   . TONSILLECTOMY      Family History  Problem Relation Age of Onset  .  Hypertension Mother   . Diabetes Mother   . Goiter Mother   . Hypertension Father   . Diabetes Father   . Asthma Father   . Stroke Paternal Grandfather   . Lung cancer Paternal Grandmother        smoker-heavy  . Diabetes Maternal Aunt   . Colon cancer Neg Hx   . Heart disease Neg Hx     Social History:  reports that she has never smoked. She has never used smokeless tobacco. She reports that she drinks alcohol. She reports that she does not use drugs.   Review of Systems   Lipid history: Has not been on medications    Lab Results  Component Value Date   CHOL 156 10/15/2017   HDL 38.00 (L) 10/15/2017   LDLCALC 90 10/15/2017   LDLDIRECT 87.5 09/22/2014   TRIG 143.0 10/15/2017   CHOLHDL 4 10/15/2017           Hypertension: Present for the last 10 years, monitored by PCP  Blood pressure is significantly high even though her blood pressure was fairly good with her PCP earlier this month Patient thought she was having low blood pressure readings at home and her PCP has advised her to taper down her carvedilol She is using a wrist monitor at home and she thinks this is accurate   She has been taking meloxicam 15 mg at times but not the last few days This is not a new medication     BP Readings from Last 3 Encounters:  06/03/18 (!) 142/86  05/27/18 130/78  12/29/17 127/82     Most recent eye exam was 6/18, followed by a retina specialist but no diabetic retinopathy present  Most recent foot exam: 7/18   LABS:  No visits with results within 1 Week(s) from this visit.  Latest known visit with results is:  Office Visit on 05/27/2018  Component Date Value Ref Range Status  . Sodium 05/27/2018 138  135  - 145 mEq/L Final  . Potassium 05/27/2018 4.4  3.5 - 5.1 mEq/L Final  . Chloride 05/27/2018 104  96 - 112 mEq/L Final  . CO2 05/27/2018 28  19 - 32 mEq/L Final  . Glucose, Bld 05/27/2018 159* 70 - 99 mg/dL Final  . BUN 05/27/2018 13  6 - 23 mg/dL Final  . Creatinine, Ser 05/27/2018 0.98  0.40 - 1.20 mg/dL Final  . Calcium 05/27/2018 10.1  8.4 - 10.5 mg/dL Final  . GFR 05/27/2018 81.27  >60.00 mL/min Final  . Hgb A1c MFr Bld 05/27/2018 6.1  4.6 - 6.5 % Final   Glycemic Control Guidelines for People with Diabetes:Non Diabetic:  <6%Goal of Therapy: <7%Additional Action Suggested:  >8%     Physical Examination:  BP (!) 142/86   Pulse 95   Ht 5' 11"  (1.803 m)   Wt (!) 411 lb (186.4 kg)   SpO2 98%   BMI 57.32 kg/m     REPEAT blood pressure with large cuff was 146/92 No edema present    ASSESSMENT:  Diabetes type 2, with Morbid obesity   See history of present illness for detailed discussion of current diabetes management, blood sugar patterns and problems identified  Her A1c is 6.1 with metformin alone  Surprisingly with not taking Invokana her blood sugars have not gone up This is despite her weight gain, recent lack of exercise However she is doing well with limiting calories and carbohydrates in her diet  Despite explanation about the benefits of Invokana  and importance of weight loss she does not want to go back on it    HYPERTENSION: Has higher readings with reducing her carvedilol including a faster pulse rate   PLAN:     She will restart exercise when able to  She needs to try higher dose of metformin to enable better effects on insulin resistance and improved fasting readings and discussed importance of improving insulin resistance long-term  She will switch to 750 mg of metformin ER and take this twice a day  Recommended that she go back on full dose of carvedilol since her blood pressure is much higher than before and not clear if she has accurate readings with  a wrist monitor She will discuss with PCP also  There are no Patient Instructions on file for this visit.       Elayne Snare 06/03/2018, 1:27 PM   Note: This office note was prepared with Dragon voice recognition system technology. Any transcriptional errors that result from this process are unintentional.

## 2018-06-18 DIAGNOSIS — M67814 Other specified disorders of tendon, left shoulder: Secondary | ICD-10-CM | POA: Diagnosis not present

## 2018-06-18 DIAGNOSIS — M7582 Other shoulder lesions, left shoulder: Secondary | ICD-10-CM | POA: Diagnosis not present

## 2018-06-18 DIAGNOSIS — M24112 Other articular cartilage disorders, left shoulder: Secondary | ICD-10-CM | POA: Diagnosis not present

## 2018-06-18 DIAGNOSIS — M24312 Pathological dislocation of left shoulder, not elsewhere classified: Secondary | ICD-10-CM | POA: Diagnosis not present

## 2018-06-22 DIAGNOSIS — M25512 Pain in left shoulder: Secondary | ICD-10-CM | POA: Diagnosis not present

## 2018-07-20 DIAGNOSIS — G4733 Obstructive sleep apnea (adult) (pediatric): Secondary | ICD-10-CM | POA: Diagnosis not present

## 2018-08-30 ENCOUNTER — Other Ambulatory Visit: Payer: BLUE CROSS/BLUE SHIELD

## 2018-09-02 ENCOUNTER — Ambulatory Visit: Payer: BLUE CROSS/BLUE SHIELD | Admitting: Endocrinology

## 2018-09-03 ENCOUNTER — Other Ambulatory Visit: Payer: Self-pay | Admitting: Family Medicine

## 2018-10-20 DIAGNOSIS — G4733 Obstructive sleep apnea (adult) (pediatric): Secondary | ICD-10-CM | POA: Diagnosis not present

## 2018-11-04 DIAGNOSIS — R293 Abnormal posture: Secondary | ICD-10-CM | POA: Diagnosis not present

## 2018-11-04 DIAGNOSIS — M545 Low back pain: Secondary | ICD-10-CM | POA: Diagnosis not present

## 2018-11-04 DIAGNOSIS — M9903 Segmental and somatic dysfunction of lumbar region: Secondary | ICD-10-CM | POA: Diagnosis not present

## 2018-11-04 DIAGNOSIS — M2560 Stiffness of unspecified joint, not elsewhere classified: Secondary | ICD-10-CM | POA: Diagnosis not present

## 2018-11-08 ENCOUNTER — Telehealth: Payer: Self-pay | Admitting: Endocrinology

## 2018-11-08 ENCOUNTER — Other Ambulatory Visit: Payer: Self-pay

## 2018-11-08 MED ORDER — CANAGLIFLOZIN 100 MG PO TABS: ORAL_TABLET | ORAL | 3 refills | Status: DC

## 2018-11-08 NOTE — Telephone Encounter (Signed)
Patient called today concerned about her blood sugars being elevated to 374. She made an appt to come in on Thursday but would like to see if there is anything she could do to get these blood sugars back down where they need to be  Please advise  PHONE- [phone]

## 2018-11-08 NOTE — Telephone Encounter (Signed)
Called patient and gave her MD message. Pt verbalized understanding and rx was sent to pharmacy listed on chart.

## 2018-11-08 NOTE — Telephone Encounter (Signed)
She will need to go back on Invokana 100 mg daily and let her know that this is safe to take.  Needs new prescription

## 2018-11-08 NOTE — Telephone Encounter (Signed)
Pt stated that her blood sugars have been as follows:  1:20- 374 7:53- 285 (fasting)  11/07/2018 9:30pm - 247  11/02/2018 4:00- 273  Pt stated that she would also go back on Invokana if Dr. Dwyane Dee suggests it.

## 2018-11-10 ENCOUNTER — Ambulatory Visit: Payer: BLUE CROSS/BLUE SHIELD | Admitting: Endocrinology

## 2018-11-11 ENCOUNTER — Ambulatory Visit: Payer: BLUE CROSS/BLUE SHIELD | Admitting: Endocrinology

## 2018-11-11 ENCOUNTER — Encounter: Payer: Self-pay | Admitting: Endocrinology

## 2018-11-11 VITALS — BP 124/92 | HR 106 | Ht 71.0 in | Wt >= 6400 oz

## 2018-11-11 DIAGNOSIS — E1169 Type 2 diabetes mellitus with other specified complication: Secondary | ICD-10-CM | POA: Diagnosis not present

## 2018-11-11 DIAGNOSIS — E669 Obesity, unspecified: Secondary | ICD-10-CM

## 2018-11-11 DIAGNOSIS — E1142 Type 2 diabetes mellitus with diabetic polyneuropathy: Secondary | ICD-10-CM | POA: Diagnosis not present

## 2018-11-11 DIAGNOSIS — E1165 Type 2 diabetes mellitus with hyperglycemia: Secondary | ICD-10-CM | POA: Diagnosis not present

## 2018-11-11 LAB — POCT GLYCOSYLATED HEMOGLOBIN (HGB A1C): HEMOGLOBIN A1C: 8.3 % — AB (ref 4.0–5.6)

## 2018-11-11 LAB — BASIC METABOLIC PANEL
BUN: 13 mg/dL (ref 6–23)
CO2: 26 mEq/L (ref 19–32)
Calcium: 10.4 mg/dL (ref 8.4–10.5)
Chloride: 98 mEq/L (ref 96–112)
Creatinine, Ser: 0.94 mg/dL (ref 0.40–1.20)
GFR: 80.04 mL/min (ref >60.00)
GLUCOSE: 182 mg/dL — AB (ref 70–99)
Potassium: 3.7 mEq/L (ref 3.5–5.1)
Sodium: 135 mEq/L (ref 135–145)

## 2018-11-11 LAB — MICROALBUMIN / CREATININE URINE RATIO
Creatinine,U: 72.7 mg/dL
Microalb Creat Ratio: 4.1 mg/g (ref 0.0–30.0)
Microalb, Ur: 3 mg/dL — ABNORMAL HIGH (ref 0.0–1.9)

## 2018-11-11 MED ORDER — GABAPENTIN 300 MG PO CAPS: 300.0000 mg | ORAL_CAPSULE | Freq: Every day | ORAL | 3 refills | Status: DC

## 2018-11-11 MED ORDER — METFORMIN HCL ER 750 MG PO TB24: 1500.0000 mg | ORAL_TABLET | Freq: Every day | ORAL | 1 refills | Status: DC

## 2018-11-11 NOTE — Patient Instructions (Signed)
Check blood sugars on waking up 3 days a week  Also check blood sugars about 2 hours after meals and do this after different meals by rotation  Recommended blood sugar levels on waking up are 90-130 and about 2 hours after meal is 130-180  Please bring your blood sugar monitor to each visit, thank you  2 Metformin  Exercise, no fried food

## 2018-11-11 NOTE — Progress Notes (Signed)
Patient ID: Angel Dawson, female   DOB: 1980-10-17, 38 y.o.   MRN: [id number]          Reason for Appointment: Follow-up Type 2 Diabetes  Referring physician: [name] [name]   History of Present Illness:          Date of diagnosis of type 2 diabetes mellitus:  2015      Background history:   She had extreme thirst and urination and was admitted to the hospital with blood sugars over 400 but no overt acidosis However she was discharged only on metformin 500 mg twice a day which she has continued Prior to that in 2014 she appears to have had mild hyperglycemia with blood sugars up to 195  Recent history:    Non-insulin hypoglycemic drugs the patient is taking are: Metformin 500 mg twice a day   Her A1c up to 8.3 compared to 6.1 in August 2019  As before she has been noncompliant with follow-up and last seen in 8/19  Current management, blood sugar patterns and problems identified:  She previously had stopped Invokana thinking that it was causing tingling in her feet  Also for better compliance she was switched from twice daily metformin to metformin ER 750 mg daily  Although her blood sugars were looking fairly good with metformin alone previously they apparently has been getting progressively higher since last year  She only called recently to report much higher readings  Also not checking blood sugars consistently  She is also having some increased thirst and urination including at night  She is still not able to lose weight  She thinks she has not been paying attention to her diet and probably getting more fried food and inconsistent portions  Also for various reasons does not exercise  Side effects from medications have been: Diarrhea from high dose metformin  Glucose monitoring:  done less than 1 times a day         Glucometer:  One Touch    PRE-MEAL Fasting Lunch Dinner Bedtime Overall  Glucose range:  220-285   185  247   Mean/median:      237+/-49    POST-MEAL PC Breakfast PC Lunch PC Dinner  Glucose range:   163-374   Mean/median:      PREVIOUS readings  PRE-MEAL Fasting Lunch Dinner Bedtime Overall  Glucose range:  116-151  97-120     Verio/libre:  146/122     123/110   POST-MEAL PC Breakfast PC Lunch PC Dinner  Glucose range:    111-145  Mean/median:          Self-care: The diet that the patient has been following is: none Breakfast usually is a protein shake with almond milk              Dietician visit, most recent: 05/2017, previously in classes               Exercise:  Minimal recently except some physical therapy  Weight history:  Wt Readings from Last 3 Encounters:  11/11/18 (!) 413 lb 9.6 oz (187.6 kg)  06/03/18 (!) 411 lb (186.4 kg)  05/27/18 (!) 415 lb (188.2 kg)    Glycemic control:   Lab Results  Component Value Date   HGBA1C 8.3 (A) 11/11/2018   HGBA1C 6.1 05/27/2018   HGBA1C 5.8 12/21/2017   Lab Results  Component Value Date   MICROALBUR 3.0 (H) 11/11/2018   LDLCALC 90 10/15/2017   CREATININE 0.94 11/11/2018   Lab  Results  Component Value Date   MICRALBCREAT 4.1 11/11/2018    Lab Results  Component Value Date   FRUCTOSAMINE 258 06/12/2017   Office Visit on 11/11/2018  Component Date Value Ref Range Status  . Hemoglobin A1C 11/11/2018 8.3* 4.0 - 5.6 % Final  . Microalb, Ur 11/11/2018 3.0* 0.0 - 1.9 mg/dL Final  . Creatinine,U 11/11/2018 72.7  mg/dL Final  . Microalb Creat Ratio 11/11/2018 4.1  0.0 - 30.0 mg/g Final  . Sodium 11/11/2018 135  135 - 145 mEq/L Final  . Potassium 11/11/2018 3.7  3.5 - 5.1 mEq/L Final  . Chloride 11/11/2018 98  96 - 112 mEq/L Final  . CO2 11/11/2018 26  19 - 32 mEq/L Final  . Glucose, Bld 11/11/2018 182* 70 - 99 mg/dL Final  . BUN 11/11/2018 13  6 - 23 mg/dL Final  . Creatinine, Ser 11/11/2018 0.94  0.40 - 1.20 mg/dL Final  . Calcium 11/11/2018 10.4  8.4 - 10.5 mg/dL Final  . GFR 11/11/2018 80.04  >60.00 mL/min Final      Allergies as of  11/11/2018   Not on File     Medication List       Accurate as of November 11, 2018  8:59 PM. Always use your most recent med list.        aspirin-acetaminophen-caffeine 250-250-65 MG tablet Commonly known as:  EXCEDRIN MIGRAINE Take 2 tablets by mouth as needed for headache.   BAYER BACK & BODY 500-32.5 MG Tabs Generic drug:  Aspirin-Caffeine Take 2 tablets by mouth as needed.   blood glucose meter kit and supplies Dispense based on patient and insurance preference. Use up to four times daily as directed. (FOR ICD-9 250.00, 250.01).   canagliflozin 100 MG Tabs tablet Commonly known as:  INVOKANA TAKE 1 TABLET (100MG) BY MOUTH ONCE DAILY.   fluticasone 50 MCG/ACT nasal spray Commonly known as:  FLONASE USE 1 SPRAY IN EACH NOSTRIL EVERY DAY   FREESTYLE LIBRE READER Devi Inject 1 Device into the skin as needed. Use sensor every 10 days.   FREESTYLE LIBRE SENSOR SYSTEM Misc Use one sensor every 10 days.   gabapentin 300 MG capsule Commonly known as:  NEURONTIN Take 1 capsule (300 mg total) by mouth at bedtime.   glucose blood test strip Commonly known as:  ONE TOUCH ULTRA TEST TEST ONCE DAILY   glucose blood test strip Commonly known as:  ONE TOUCH ULTRA TEST TEST ONCE DAILY AS DIRECTED   lisinopril-hydrochlorothiazide 20-25 MG tablet Commonly known as:  PRINZIDE,ZESTORETIC TAKE 1 TABLET BY MOUTH DAILY   metFORMIN 750 MG 24 hr tablet Commonly known as:  GLUCOPHAGE-XR Take 2 tablets (1,500 mg total) by mouth daily with breakfast.   ONETOUCH DELICA LANCETS 29J Misc Use once daily to check glucose.   PROBIOTIC-10 PO Take 1 capsule by mouth daily. Takes Fortify Women's Digestive Probiotic       Allergies: Not on File  Past Medical History:  Diagnosis Date  . Abdominal pain    related to gallstones  . Achilles tendonitis, bilateral   . Bronchitis    02/17  . Diabetes ([place])   . DKA (diabetic ketoacidoses) ([place]) 08/25/2014  . Gallstones   . GERD  (gastroesophageal reflux disease)   . Headache   . Hypertension   . Lattice degeneration, both eyes   . Pancreatitis   . PCOS (polycystic ovarian syndrome)   . Sinus congestion    occ. uses OTC meds for this    Past Surgical  History:  Procedure Laterality Date  . CHOLECYSTECTOMY N/A 06/22/2013   Procedure: LAPAROSCOPIC CHOLECYSTECTOMY ;  Surgeon: Adin Hector, MD;  Location: [facility];  Service: General;  Laterality: N/A;  . DILITATION & CURRETTAGE/HYSTROSCOPY WITH HYDROTHERMAL ABLATION N/A 01/03/2016   Procedure: DILATATION & CURETTAGE/HYSTEROSCOPY WITH HYDROTHERMAL ABLATION;  Surgeon: Crawford Givens, MD;  Location: [facility];  Service: Gynecology;  Laterality: N/A;  HTA rep will be attend confirmed by office 12/12/15   . TONSILLECTOMY      Family History  Problem Relation Age of Onset  . Hypertension Mother   . Diabetes Mother   . Goiter Mother   . Hypertension Father   . Diabetes Father   . Asthma Father   . Stroke Paternal Grandfather   . Lung cancer Paternal Grandmother        smoker-heavy  . Diabetes Maternal Aunt   . Colon cancer Neg Hx   . Heart disease Neg Hx     Social History:  reports that she has never smoked. She has never used smokeless tobacco. She reports current alcohol use. She reports that she does not use drugs.   Review of Systems   Lipid history: Has not been on medication for lipids    Lab Results  Component Value Date   CHOL 156 10/15/2017   HDL 38.00 (L) 10/15/2017   LDLCALC 90 10/15/2017   LDLDIRECT 87.5 09/22/2014   TRIG 143.0 10/15/2017   CHOLHDL 4 10/15/2017           Hypertension: Present for the last 10 years, monitored by PCP  Blood pressure is persistently high She was told to restart her carvedilol but she has not done so and has not made a follow-up with her PCP     BP Readings from Last 3 Encounters:  11/11/18 (!) 124/92  06/03/18 (!) 142/86  05/27/18 130/78     Most recent eye exam was 6/18, followed by a retina  specialist but no diabetic retinopathy present  She is complaining of increased tingling and pins-and-needles in her feet and this is not keeping her up at night  Most recent foot exam: 10/2018   LABS:  Office Visit on 11/11/2018  Component Date Value Ref Range Status  . Hemoglobin A1C 11/11/2018 8.3* 4.0 - 5.6 % Final  . Microalb, Ur 11/11/2018 3.0* 0.0 - 1.9 mg/dL Final  . Creatinine,U 11/11/2018 72.7  mg/dL Final  . Microalb Creat Ratio 11/11/2018 4.1  0.0 - 30.0 mg/g Final  . Sodium 11/11/2018 135  135 - 145 mEq/L Final  . Potassium 11/11/2018 3.7  3.5 - 5.1 mEq/L Final  . Chloride 11/11/2018 98  96 - 112 mEq/L Final  . CO2 11/11/2018 26  19 - 32 mEq/L Final  . Glucose, Bld 11/11/2018 182* 70 - 99 mg/dL Final  . BUN 11/11/2018 13  6 - 23 mg/dL Final  . Creatinine, Ser 11/11/2018 0.94  0.40 - 1.20 mg/dL Final  . Calcium 11/11/2018 10.4  8.4 - 10.5 mg/dL Final  . GFR 11/11/2018 80.04  >60.00 mL/min Final    Physical Examination:  BP (!) 124/92 (BP Location: Left Arm, Patient Position: Sitting, Cuff Size: Large)   Pulse (!) 106   Ht 5' 11"  (1.803 m)   Wt (!) 413 lb 9.6 oz (187.6 kg)   SpO2 98%   BMI 57.69 kg/m   Diabetic Foot Exam - Simple   Simple Foot Form Diabetic Foot exam was performed with the following findings:  Yes   Visual Inspection  No deformities, no ulcerations, no other skin breakdown bilaterally:  Yes Sensation Testing Intact to touch and monofilament testing bilaterally:  Yes Pulse Check Posterior Tibialis and Dorsalis pulse intact bilaterally:  Yes Comments        ASSESSMENT:  Diabetes type 2, with Morbid obesity   See history of present illness for detailed discussion of current diabetes management, blood sugar patterns and problems identified  Her A1c which was previously 6.1 on metformin alone and much higher at 8.3  Previously had responded to [place] which she stopped on her own Continues to be morbidly obese and has poor compliance  with diet and exercise Also not checking blood sugars regularly She continues to take only low-dose metformin  Also no symptoms of neuropathy have occurred significantly and she is concerned about this    HYPERTENSION: Recommend that she start back on carvedilol but she wants to first see her PCP Discussed diastolic targets of under 80  PLAN:     She will restart Invokana  Given her patient information on Invokana and cardiovascular benefits.  Explained to her how [place] works and benefits including weight loss and better blood pressure control  She will increase metformin ER to 2 tablets daily  More consistent monitoring  Cut back on high fat foods and consider consultation with dietitian again  More consistent follow-up  Trial of gabapentin 300 mg at bedtime for neuropathy  She will have baseline labs done today  Counseling time on subjects discussed in assessment and plan sections is over 50% of today's 25 minute visit   Patient Instructions  Check blood sugars on waking up 3 days a week  Also check blood sugars about 2 hours after meals and do this after different meals by rotation  Recommended blood sugar levels on waking up are 90-130 and about 2 hours after meal is 130-180  Please bring your blood sugar monitor to each visit, thank you  2 Metformin  Exercise, no fried food          Elayne Snare 11/11/2018, 8:59 PM   Note: This office note was prepared with Dragon voice recognition system technology. Any transcriptional errors that result from this process are unintentional.

## 2018-11-16 DIAGNOSIS — M2560 Stiffness of unspecified joint, not elsewhere classified: Secondary | ICD-10-CM | POA: Diagnosis not present

## 2018-11-16 DIAGNOSIS — R293 Abnormal posture: Secondary | ICD-10-CM | POA: Diagnosis not present

## 2018-11-16 DIAGNOSIS — M9903 Segmental and somatic dysfunction of lumbar region: Secondary | ICD-10-CM | POA: Diagnosis not present

## 2018-11-16 DIAGNOSIS — M545 Low back pain: Secondary | ICD-10-CM | POA: Diagnosis not present

## 2018-12-24 ENCOUNTER — Other Ambulatory Visit: Payer: BLUE CROSS/BLUE SHIELD

## 2018-12-27 ENCOUNTER — Other Ambulatory Visit: Payer: Self-pay

## 2018-12-27 ENCOUNTER — Other Ambulatory Visit (INDEPENDENT_AMBULATORY_CARE_PROVIDER_SITE_OTHER): Payer: BLUE CROSS/BLUE SHIELD

## 2018-12-27 DIAGNOSIS — E1165 Type 2 diabetes mellitus with hyperglycemia: Secondary | ICD-10-CM

## 2018-12-27 LAB — BASIC METABOLIC PANEL
BUN: 14 mg/dL (ref 6–23)
CO2: 26 meq/L (ref 19–32)
Calcium: 10.3 mg/dL (ref 8.4–10.5)
Chloride: 103 mEq/L (ref 96–112)
Creatinine, Ser: 0.8 mg/dL (ref 0.40–1.20)
GFR: 96.35 mL/min (ref >60.00)
Glucose, Bld: 161 mg/dL — ABNORMAL HIGH (ref 70–99)
Potassium: 4.2 mEq/L (ref 3.5–5.1)
Sodium: 136 mEq/L (ref 135–145)

## 2018-12-28 LAB — FRUCTOSAMINE: FRUCTOSAMINE: 269 umol/L (ref 0–285)

## 2018-12-29 ENCOUNTER — Encounter: Payer: Self-pay | Admitting: Endocrinology

## 2018-12-29 ENCOUNTER — Ambulatory Visit (INDEPENDENT_AMBULATORY_CARE_PROVIDER_SITE_OTHER): Payer: BLUE CROSS/BLUE SHIELD | Admitting: Endocrinology

## 2018-12-29 ENCOUNTER — Other Ambulatory Visit: Payer: Self-pay

## 2018-12-29 VITALS — BP 130/90 | HR 104 | Temp 98.4°F | Ht 71.0 in | Wt >= 6400 oz

## 2018-12-29 DIAGNOSIS — E1165 Type 2 diabetes mellitus with hyperglycemia: Secondary | ICD-10-CM | POA: Diagnosis not present

## 2018-12-29 DIAGNOSIS — E1169 Type 2 diabetes mellitus with other specified complication: Secondary | ICD-10-CM

## 2018-12-29 MED ORDER — CANAGLIFLOZIN 300 MG PO TABS: 300.0000 mg | ORAL_TABLET | Freq: Every day | ORAL | 3 refills | Status: DC

## 2018-12-29 MED ORDER — ONETOUCH DELICA LANCETS 33G MISC: 1 refills | Status: DC

## 2018-12-29 MED ORDER — GLUCOSE BLOOD VI STRP: ORAL_STRIP | 3 refills | Status: DC

## 2018-12-29 NOTE — Progress Notes (Signed)
Patient ID: Angel Dawson, female   DOB: [birth year], 38 y.o.   MRN: [id number]          Reason for Appointment: Follow-up Type 2 Diabetes  Referring physician: [name] [name]   History of Present Illness:          Date of diagnosis of type 2 diabetes mellitus:  2015      Background history:   She had extreme thirst and urination and was admitted to the hospital with blood sugars over 400 but no overt acidosis However she was discharged only on metformin 500 mg twice a day which she has continued Prior to that in 2014 she appears to have had mild hyperglycemia with blood sugars up to 195  Recent history:    Non-insulin hypoglycemic drugs the patient is taking are: Metformin er 750 mg twice a day, Invokana 100 mg daily  Her A1c was to 8.3 in 10/2018  Fructosamine is now 269, similar to 258 done before  Current management, blood sugar patterns and problems identified:  She has gone back to taking Invokana without side effects such as tingling which she had reported before and many yeast infections or itching  However she thinks he is getting diarrhea from metformin but this is mostly when she takes it without food in the morning and only with her protein shake.  Also she is taking 2 tablets together at the same time  She has not started any exercise program as her gym recently closed  She sometimes will skip breakfast and occasionally fast for a few hours  However her weight is down only 4 pounds  Blood sugars which were averaging 237 now significantly better at home but unable to download her meter today  Side effects from medications have been: Diarrhea from high dose metformin  Glucose monitoring:  done less than 1 times a day         Glucometer:  One Touch   Recent readings at various times 92-168  PREVIOUS readings:  PRE-MEAL Fasting Lunch Dinner Bedtime Overall  Glucose range:  220-285   185  247   Mean/median:      237+/-49   POST-MEAL PC Breakfast PC Lunch  PC Dinner  Glucose range:   163-374   Mean/median:          Self-care: The diet that the patient has been following is: none Breakfast usually is a protein shake with almond milk, sometimes omelette              Dietician visit, most recent: 05/2017, previously in classes               Exercise: a little walking at times  Weight history:  Wt Readings from Last 3 Encounters:  12/29/18 (!) 409 lb 12.8 oz (185.9 kg)  11/11/18 (!) 413 lb 9.6 oz (187.6 kg)  06/03/18 (!) 411 lb (186.4 kg)    Glycemic control:    Lab Results  Component Value Date   HGBA1C 8.3 (A) 11/11/2018   HGBA1C 6.1 05/27/2018   HGBA1C 5.8 12/21/2017   Lab Results  Component Value Date   MICROALBUR 3.0 (H) 11/11/2018   LDLCALC 90 10/15/2017   CREATININE 0.80 12/27/2018   Lab Results  Component Value Date   MICRALBCREAT 4.1 11/11/2018    Lab Results  Component Value Date   FRUCTOSAMINE 269 12/27/2018   FRUCTOSAMINE 258 06/12/2017   Lab on 12/27/2018  Component Date Value Ref Range Status  . Fructosamine 12/27/2018 269  0 - 285 umol/L Final   Comment: Published reference interval for apparently healthy subjects between age 84 and 38 is 55 - 285 umol/L and in a poorly controlled diabetic population is 228 - 563 umol/L with a mean of 396 umol/L.   Marland Kitchen Sodium 12/27/2018 136  135 - 145 mEq/L Final  . Potassium 12/27/2018 4.2  3.5 - 5.1 mEq/L Final  . Chloride 12/27/2018 103  96 - 112 mEq/L Final  . CO2 12/27/2018 26  19 - 32 mEq/L Final  . Glucose, Bld 12/27/2018 161* 70 - 99 mg/dL Final  . BUN 12/27/2018 14  6 - 23 mg/dL Final  . Creatinine, Ser 12/27/2018 0.80  0.40 - 1.20 mg/dL Final  . Calcium 12/27/2018 10.3  8.4 - 10.5 mg/dL Final  . GFR 12/27/2018 96.35  >60.00 mL/min Final      Allergies as of 12/29/2018   Not on File     Medication List       Accurate as of December 29, 2018  3:20 PM. Always use your most recent med list.        aspirin-acetaminophen-caffeine 250-250-65 MG tablet  Commonly known as:  EXCEDRIN MIGRAINE Take 2 tablets by mouth as needed for headache.   Bayer Back & Body 500-32.5 MG Tabs Generic drug:  Aspirin-Caffeine Take 2 tablets by mouth as needed.   blood glucose meter kit and supplies Dispense based on patient and insurance preference. Use up to four times daily as directed. (FOR ICD-9 250.00, 250.01).   canagliflozin 100 MG Tabs tablet Commonly known as:  Invokana TAKE 1 TABLET (100MG) BY MOUTH ONCE DAILY.   fluticasone 50 MCG/ACT nasal spray Commonly known as:  FLONASE USE 1 SPRAY IN EACH NOSTRIL EVERY DAY   FreeStyle Libre Reader Devi Inject 1 Device into the skin as needed. Use sensor every 10 days.   FreeStyle [employer] Use one sensor every 10 days.   gabapentin 300 MG capsule Commonly known as:  NEURONTIN Take 1 capsule (300 mg total) by mouth at bedtime.   glucose blood test strip Commonly known as:  ONE TOUCH ULTRA TEST TEST ONCE DAILY   glucose blood test strip Commonly known as:  ONE TOUCH ULTRA TEST TEST ONCE DAILY AS DIRECTED   lisinopril-hydrochlorothiazide 20-25 MG tablet Commonly known as:  PRINZIDE,ZESTORETIC TAKE 1 TABLET BY MOUTH DAILY   metFORMIN 750 MG 24 hr tablet Commonly known as:  GLUCOPHAGE-XR Take 2 tablets (1,500 mg total) by mouth daily with breakfast.   OneTouch Delica Lancets 82N Misc Use once daily to check glucose.   PROBIOTIC-10 PO Take 1 capsule by mouth daily. Takes Fortify Women's Digestive Probiotic       Allergies: Not on File  Past Medical History:  Diagnosis Date  . Abdominal pain    related to gallstones  . Achilles tendonitis, bilateral   . Bronchitis    02/17  . Diabetes ([place])   . DKA (diabetic ketoacidoses) ([place]) 08/25/2014  . Gallstones   . GERD (gastroesophageal reflux disease)   . Headache   . Hypertension   . Lattice degeneration, both eyes   . Pancreatitis   . PCOS (polycystic ovarian syndrome)   . Sinus congestion    occ. uses OTC meds  for this    Past Surgical History:  Procedure Laterality Date  . CHOLECYSTECTOMY N/A 06/22/2013   Procedure: LAPAROSCOPIC CHOLECYSTECTOMY ;  Surgeon: Adin Hector, MD;  Location: [facility];  Service: General;  Laterality: N/A;  . DILITATION &  CURRETTAGE/HYSTROSCOPY WITH HYDROTHERMAL ABLATION N/A 01/03/2016   Procedure: DILATATION & CURETTAGE/HYSTEROSCOPY WITH HYDROTHERMAL ABLATION;  Surgeon: Crawford Givens, MD;  Location: [facility];  Service: Gynecology;  Laterality: N/A;  HTA rep will be attend confirmed by office 12/12/15   . TONSILLECTOMY      Family History  Problem Relation Age of Onset  . Hypertension Mother   . Diabetes Mother   . Goiter Mother   . Hypertension Father   . Diabetes Father   . Asthma Father   . Stroke Paternal Grandfather   . Lung cancer Paternal Grandmother        smoker-heavy  . Diabetes Maternal Aunt   . Colon cancer Neg Hx   . Heart disease Neg Hx     Social History:  reports that she has never smoked. She has never used smokeless tobacco. She reports current alcohol use. She reports that she does not use drugs.   Review of Systems   Lipid history: Has not been on medication for hyperlipidemia with LDL below 100    Lab Results  Component Value Date   CHOL 156 10/15/2017   HDL 38.00 (L) 10/15/2017   LDLCALC 90 10/15/2017   LDLDIRECT 87.5 09/22/2014   TRIG 143.0 10/15/2017   CHOLHDL 4 10/15/2017           Hypertension: Present for the last 10 years, monitored by PCP  Blood pressure is persistently high This is followed by her PCP but needs follow-up     BP Readings from Last 3 Encounters:  12/29/18 130/90  11/11/18 (!) 124/92  06/03/18 (!) 142/86    Most recent eye exam was 6/18, followed by a retina specialist but no diabetic retinopathy present  She is complaining of increased tingling and pins-and-needles in her feet somewhat better with gabapentin  Most recent foot exam: 10/2018   LABS:  Lab on 12/27/2018  Component Date  Value Ref Range Status  . Fructosamine 12/27/2018 269  0 - 285 umol/L Final   Comment: Published reference interval for apparently healthy subjects between age 53 and 70 is 62 - 285 umol/L and in a poorly controlled diabetic population is 228 - 563 umol/L with a mean of 396 umol/L.   Marland Kitchen Sodium 12/27/2018 136  135 - 145 mEq/L Final  . Potassium 12/27/2018 4.2  3.5 - 5.1 mEq/L Final  . Chloride 12/27/2018 103  96 - 112 mEq/L Final  . CO2 12/27/2018 26  19 - 32 mEq/L Final  . Glucose, Bld 12/27/2018 161* 70 - 99 mg/dL Final  . BUN 12/27/2018 14  6 - 23 mg/dL Final  . Creatinine, Ser 12/27/2018 0.80  0.40 - 1.20 mg/dL Final  . Calcium 12/27/2018 10.3  8.4 - 10.5 mg/dL Final  . GFR 12/27/2018 96.35  >60.00 mL/min Final    Physical Examination:  BP 130/90 (BP Location: Left Arm, Patient Position: Sitting, Cuff Size: Large)   Pulse (!) 104   Temp 98.4 F (36.9 C) (Oral)   Ht _0  (1.803 m)   Wt (!) 409 lb 12.8 oz (185.9 kg)   SpO2 98%   BMI 57.16 kg/m       ASSESSMENT:  Diabetes type 2, with Morbid obesity   See history of present illness for detailed discussion of current diabetes management, blood sugar patterns and problems identified  Her A1c was last 8.3  With Invokana and metformin her fructosamine is 268 and blood sugars are improving She is also doing somewhat better on her diet but has  not lost significant amount of weight She does need to exercise Unable to download meter today    HYPERTENSION: Recommend that she follow-up with her PCP   PLAN:     She will increase Invokana to 2 tablets for now and then go to 300 mg prescription  Again discussed possible side effects, benefits and she will try to continue increasing fluid intake Regular walking or other exercise Encouraged her to have some meal at breakfast consistently She can take her metformin at lunch and dinner instead of both tablets at breakfast unless she is eating a meal One Touch very meter  given and she will try to upload results online  There are no Patient Instructions on file for this visit.       Elayne Snare 12/29/2018, 3:20 PM   Note: This office note was prepared with Dragon voice recognition system technology. Any transcriptional errors that result from this process are unintentional.

## 2018-12-29 NOTE — Patient Instructions (Addendum)
Take 2 of 100 Invokana to finish  Check blood sugars on waking up 3-4 days a week  Also check blood sugars about 2 hours after meals and do this after different meals by rotation  Recommended blood sugar levels on waking up are 90-130 and about 2 hours after meal is 130-160  Please bring your blood sugar monitor to each visit, thank you

## 2019-02-24 ENCOUNTER — Other Ambulatory Visit: Payer: BLUE CROSS/BLUE SHIELD

## 2019-02-28 ENCOUNTER — Ambulatory Visit: Payer: BLUE CROSS/BLUE SHIELD | Admitting: Endocrinology

## 2019-03-11 ENCOUNTER — Other Ambulatory Visit (INDEPENDENT_AMBULATORY_CARE_PROVIDER_SITE_OTHER): Payer: BLUE CROSS/BLUE SHIELD

## 2019-03-11 ENCOUNTER — Other Ambulatory Visit: Payer: Self-pay

## 2019-03-11 DIAGNOSIS — E1165 Type 2 diabetes mellitus with hyperglycemia: Secondary | ICD-10-CM

## 2019-03-11 LAB — LIPID PANEL
Cholesterol: 184 mg/dL (ref 0–200)
HDL: 41.7 mg/dL (ref >39.00)
LDL Cholesterol: 106 mg/dL — ABNORMAL HIGH (ref 0–99)
NonHDL: 142.63
Total CHOL/HDL Ratio: 4
Triglycerides: 183 mg/dL — ABNORMAL HIGH (ref 0.0–149.0)
VLDL: 36.6 mg/dL (ref 0.0–40.0)

## 2019-03-11 LAB — COMPREHENSIVE METABOLIC PANEL
ALT: 17 U/L (ref 0–35)
AST: 17 U/L (ref 0–37)
Albumin: 4 g/dL (ref 3.5–5.2)
Alkaline Phosphatase: 69 U/L (ref 39–117)
BUN: 14 mg/dL (ref 6–23)
CO2: 27 mEq/L (ref 19–32)
Calcium: 9.5 mg/dL (ref 8.4–10.5)
Chloride: 103 mEq/L (ref 96–112)
Creatinine, Ser: 0.85 mg/dL (ref 0.40–1.20)
GFR: 89.75 mL/min (ref >60.00)
Glucose, Bld: 205 mg/dL — ABNORMAL HIGH (ref 70–99)
Potassium: 4 mEq/L (ref 3.5–5.1)
Sodium: 137 mEq/L (ref 135–145)
Total Bilirubin: 0.3 mg/dL (ref 0.2–1.2)
Total Protein: 6.9 g/dL (ref 6.0–8.3)

## 2019-03-11 LAB — HEMOGLOBIN A1C: Hgb A1c MFr Bld: 6.3 % (ref 4.6–6.5)

## 2019-03-15 DIAGNOSIS — R0989 Other specified symptoms and signs involving the circulatory and respiratory systems: Secondary | ICD-10-CM | POA: Diagnosis not present

## 2019-03-15 DIAGNOSIS — Z20828 Contact with and (suspected) exposure to other viral communicable diseases: Secondary | ICD-10-CM | POA: Diagnosis not present

## 2019-03-15 NOTE — Progress Notes (Signed)
Patient ID: Angel Dawson, female   DOB: Dec 24, 1980, 38 y.o.   MRN: [id number]          Reason for Appointment: Follow-up Type 2 Diabetes  Referring physician: Ladaija [name]   Today's office visit was provided via telemedicine using video technique Explained to the patient and the the limitations of evaluation and management by telemedicine and the availability of in person appointments.  The patient understood the limitations and agreed to proceed. Patient also understood that the telehealth visit is billable. . Location of the patient: Home . Location of the provider: Office Only the patient and myself were participating in the encounter    History of Present Illness:          Date of diagnosis of type 2 diabetes mellitus:  2015      Background history:   She had extreme thirst and urination and was admitted to the hospital with blood sugars over 400 but no overt acidosis However she was discharged only on metformin 500 mg twice a day which she has continued Prior to that in 2014 she appears to have had mild hyperglycemia with blood sugars up to 195  Recent history:    Non-insulin hypoglycemic drugs the patient is taking are: Metformin Er 750 mg twice a day, Invokana 300 mg daily  Her A1c was up to 8.3 in 10/2018 It is now much better at 6.3    Current management, blood sugar patterns and problems identified:  She has continued taking Invokana and this was increased to 300 mg for better efficacy  Although her blood sugars are still looking fairly good overall she does not think she has lost weight according to the office scales but she thinks her weight at home is 399  Because of back problems and lockdown she has not been able to do much exercise  Only recently has been trying to do some gentle on line exercises but has not done much walking also  She has been somewhat variable with how she is planning her meals  Recently trying to check blood sugars at  different times of the day but overall still infrequently and only 13 in the last month  No side effects with Invokana  With taking her metformin at lunch and dinner instead of breakfast and dinner she does not have diarrhea  Side effects from medications have been: Diarrhea from high dose metformin  Glucose monitoring:  done less than 1 times a day         Glucometer:  One Touch    PRE-MEAL Fasting Lunch Dinner Bedtime Overall  Glucose range:  135-174  126-151    98-174  Mean/median:    100   135   POST-MEAL PC Breakfast PC Lunch PC Dinner  Glucose range:   108, 150  98, 101  Mean/median:           Self-care: The diet that the patient has been following is: none Breakfast usually is a protein shake with almond milk, sometimes omelette              Dietician visit, most recent: 05/2017, previously in classes               Exercise: a little walking at times  Weight history:  Wt Readings from Last 3 Encounters:  12/29/18 (!) 409 lb 12.8 oz (185.9 kg)  11/11/18 (!) 413 lb 9.6 oz (187.6 kg)  06/03/18 (!) 411 lb (186.4 kg)    Glycemic control:  Lab Results  Component Value Date   HGBA1C 6.3 03/11/2019   HGBA1C 8.3 (A) 11/11/2018   HGBA1C 6.1 05/27/2018   Lab Results  Component Value Date   MICROALBUR 3.0 (H) 11/11/2018   LDLCALC 106 (H) 03/11/2019   CREATININE 0.85 03/11/2019   Lab Results  Component Value Date   MICRALBCREAT 4.1 11/11/2018    Lab Results  Component Value Date   FRUCTOSAMINE 269 12/27/2018   FRUCTOSAMINE 258 06/12/2017   Lab on 03/11/2019  Component Date Value Ref Range Status  . Cholesterol 03/11/2019 184  0 - 200 mg/dL Final   ATP III Classification       Desirable:  < 200 mg/dL               Borderline High:  200 - 239 mg/dL          High:  > = 240 mg/dL  . Triglycerides 03/11/2019 183.0* 0.0 - 149.0 mg/dL Final   Normal:  <150 mg/dLBorderline High:  150 - 199 mg/dL  . HDL 03/11/2019 41.70  >39.00 mg/dL Final  . VLDL 03/11/2019 36.6   0.0 - 40.0 mg/dL Final  . LDL Cholesterol 03/11/2019 106* 0 - 99 mg/dL Final  . Total CHOL/HDL Ratio 03/11/2019 4   Final                  Men          Women1/2 Average Risk     3.4          3.3Average Risk          5.0          4.42X Average Risk          9.6          7.13X Average Risk          15.0          11.0                      . NonHDL 03/11/2019 142.63   Final   NOTE:  Non-HDL goal should be 30 mg/dL higher than patient's LDL goal (i.e. LDL goal of < 70 mg/dL, would have non-HDL goal of < 100 mg/dL)  . Sodium 03/11/2019 137  135 - 145 mEq/L Final  . Potassium 03/11/2019 4.0  3.5 - 5.1 mEq/L Final  . Chloride 03/11/2019 103  96 - 112 mEq/L Final  . CO2 03/11/2019 27  19 - 32 mEq/L Final  . Glucose, Bld 03/11/2019 205* 70 - 99 mg/dL Final  . BUN 03/11/2019 14  6 - 23 mg/dL Final  . Creatinine, Ser 03/11/2019 0.85  0.40 - 1.20 mg/dL Final  . Total Bilirubin 03/11/2019 0.3  0.2 - 1.2 mg/dL Final  . Alkaline Phosphatase 03/11/2019 69  39 - 117 U/L Final  . AST 03/11/2019 17  0 - 37 U/L Final  . ALT 03/11/2019 17  0 - 35 U/L Final  . Total Protein 03/11/2019 6.9  6.0 - 8.3 g/dL Final  . Albumin 03/11/2019 4.0  3.5 - 5.2 g/dL Final  . Calcium 03/11/2019 9.5  8.4 - 10.5 mg/dL Final  . GFR 03/11/2019 89.75  >60.00 mL/min Final  . Hgb A1c MFr Bld 03/11/2019 6.3  4.6 - 6.5 % Final   Glycemic Control Guidelines for People with Diabetes:Non Diabetic:  <6%Goal of Therapy: <7%Additional Action Suggested:  >8%       Allergies as of 03/16/2019   Not on  File     Medication List       Accurate as of March 15, 2019  9:27 PM. If you have any questions, ask your nurse or doctor.        aspirin-acetaminophen-caffeine 250-250-65 MG tablet Commonly known as:  EXCEDRIN MIGRAINE Take 2 tablets by mouth as needed for headache.   [employer] Back [employer] Body 500-32.5 MG Tabs Generic drug:  Aspirin-Caffeine Take 2 tablets by mouth as needed.   OneTouch Verio Flex System w/Device Kit 1 each by Does not  apply route daily. Use as instructed to check blood sugar once daily.   blood glucose meter kit and supplies Dispense based on patient and insurance preference. Use up to four times daily as directed. (FOR ICD-9 250.00, 250.01).   canagliflozin 300 MG Tabs tablet Commonly known as:  Invokana Take 1 tablet (300 mg total) by mouth daily before breakfast.   fluticasone 50 MCG/ACT nasal spray Commonly known as:  FLONASE USE 1 SPRAY IN EACH NOSTRIL EVERY DAY   gabapentin 300 MG capsule Commonly known as:  NEURONTIN Take 1 capsule (300 mg total) by mouth at bedtime.   glucose blood test strip Use OneTouch Verio Flex test strips as instructed to check blood sugar once daily.   lisinopril-hydrochlorothiazide 20-25 MG tablet Commonly known as:  ZESTORETIC TAKE 1 TABLET BY MOUTH DAILY   metFORMIN 750 MG 24 hr tablet Commonly known as:  GLUCOPHAGE-XR Take 2 tablets (1,500 mg total) by mouth daily with breakfast.   OneTouch Delica Lancets 45Y Misc 1 each by Does not apply route daily. Use as instructed to check blood sugar once daily.   OneTouch Delica Lancets 09X Misc Use once daily to check glucose.   PROBIOTIC-10 PO Take 1 capsule by mouth daily. Takes Fortify Women's Digestive Probiotic       Allergies: Not on File  Past Medical History:  Diagnosis Date  . Abdominal pain    related to gallstones  . Achilles tendonitis, bilateral   . Bronchitis    02/17  . Diabetes ([place])   . DKA (diabetic ketoacidoses) ([place]) 08/25/2014  . Gallstones   . GERD (gastroesophageal reflux disease)   . Headache   . Hypertension   . Lattice degeneration, both eyes   . Pancreatitis   . PCOS (polycystic ovarian syndrome)   . Sinus congestion    occ. uses OTC meds for this    Past Surgical History:  Procedure Laterality Date  . CHOLECYSTECTOMY N/A 06/22/2013   Procedure: LAPAROSCOPIC CHOLECYSTECTOMY ;  Surgeon: Adin Hector, MD;  Location: [facility];  Service: General;  Laterality: N/A;   . DILITATION & CURRETTAGE/HYSTROSCOPY WITH HYDROTHERMAL ABLATION N/A 01/03/2016   Procedure: DILATATION & CURETTAGE/HYSTEROSCOPY WITH HYDROTHERMAL ABLATION;  Surgeon: Crawford Givens, MD;  Location: [facility];  Service: Gynecology;  Laterality: N/A;  HTA rep will be attend confirmed by office 12/12/15   . TONSILLECTOMY      Family History  Problem Relation Age of Onset  . Hypertension Mother   . Diabetes Mother   . Goiter Mother   . Hypertension Father   . Diabetes Father   . Asthma Father   . Stroke Paternal Grandfather   . Lung cancer Paternal Grandmother        smoker-heavy  . Diabetes Maternal Aunt   . Colon cancer Neg Hx   . Heart disease Neg Hx     Social History:  reports that she has never smoked. She has never used smokeless tobacco. She reports current  alcohol use. She reports that she does not use drugs.   Review of Systems   Lipid history: Has not been on medication for hyperlipidemia with variable results    Lab Results  Component Value Date   CHOL 184 03/11/2019   CHOL 156 10/15/2017   CHOL 168 10/08/2016   Lab Results  Component Value Date   HDL 41.70 03/11/2019   HDL 38.00 (L) 10/15/2017   HDL 41.90 10/08/2016   Lab Results  Component Value Date   LDLCALC 106 (H) 03/11/2019   LDLCALC 90 10/15/2017   [place] 95 10/08/2016   Lab Results  Component Value Date   TRIG 183.0 (H) 03/11/2019   TRIG 143.0 10/15/2017   TRIG 153.0 (H) 10/08/2016   Lab Results  Component Value Date   CHOLHDL 4 03/11/2019   CHOLHDL 4 10/15/2017   CHOLHDL 4 10/08/2016   Lab Results  Component Value Date   LDLDIRECT 87.5 09/22/2014            Hypertension: Present for the last 10 years, monitored by PCP  Blood pressure is previously high This is followed by her PCP but needs follow-up     BP Readings from Last 3 Encounters:  12/29/18 130/90  11/11/18 (!) 124/92  06/03/18 (!) 142/86    Most recent eye exam was 6/18, followed by a retina specialist but no  diabetic retinopathy present  She is complaining of increased tingling and pins-and-needles in her feet somewhat better with gabapentin  Most recent foot exam: 10/2018   LABS:  Lab on 03/11/2019  Component Date Value Ref Range Status  . Cholesterol 03/11/2019 184  0 - 200 mg/dL Final   ATP III Classification       Desirable:  < 200 mg/dL               Borderline High:  200 - 239 mg/dL          High:  > = 240 mg/dL  . Triglycerides 03/11/2019 183.0* 0.0 - 149.0 mg/dL Final   Normal:  <150 mg/dLBorderline High:  150 - 199 mg/dL  . HDL 03/11/2019 41.70  >39.00 mg/dL Final  . VLDL 03/11/2019 36.6  0.0 - 40.0 mg/dL Final  . LDL Cholesterol 03/11/2019 106* 0 - 99 mg/dL Final  . Total CHOL/HDL Ratio 03/11/2019 4   Final                  Men          Women1/2 Average Risk     3.4          3.3Average Risk          5.0          4.42X Average Risk          9.6          7.13X Average Risk          15.0          11.0                      . NonHDL 03/11/2019 142.63   Final   NOTE:  Non-HDL goal should be 30 mg/dL higher than patient's LDL goal (i.e. LDL goal of < 70 mg/dL, would have non-HDL goal of < 100 mg/dL)  . Sodium 03/11/2019 137  135 - 145 mEq/L Final  . Potassium 03/11/2019 4.0  3.5 - 5.1 mEq/L Final  . Chloride 03/11/2019 103  96 - 112  mEq/L Final  . CO2 03/11/2019 27  19 - 32 mEq/L Final  . Glucose, Bld 03/11/2019 205* 70 - 99 mg/dL Final  . BUN 03/11/2019 14  6 - 23 mg/dL Final  . Creatinine, Ser 03/11/2019 0.85  0.40 - 1.20 mg/dL Final  . Total Bilirubin 03/11/2019 0.3  0.2 - 1.2 mg/dL Final  . Alkaline Phosphatase 03/11/2019 69  39 - 117 U/L Final  . AST 03/11/2019 17  0 - 37 U/L Final  . ALT 03/11/2019 17  0 - 35 U/L Final  . Total Protein 03/11/2019 6.9  6.0 - 8.3 g/dL Final  . Albumin 03/11/2019 4.0  3.5 - 5.2 g/dL Final  . Calcium 03/11/2019 9.5  8.4 - 10.5 mg/dL Final  . GFR 03/11/2019 89.75  >60.00 mL/min Final  . Hgb A1c MFr Bld 03/11/2019 6.3  4.6 - 6.5 % Final   Glycemic  Control Guidelines for People with Diabetes:Non Diabetic:  <6%Goal of Therapy: <7%Additional Action Suggested:  >8%     Physical Examination:  There were no vitals taken for this visit.      ASSESSMENT:  Diabetes type 2, with Morbid obesity   See history of present illness for detailed discussion of current diabetes management, blood sugar patterns and problems identified  Her A1c is now 6.3  With Invokana 300 mg and metformin her blood sugar control appears to be fairly good She tends to have high fasting readings despite not obviously having any high readings after evening meal She is doing better with tolerating metformin  Has not done enough exercises yet and is not able to do as much currently Her weight is stable  Renal function stable  LIPIDS: Her lipids are somewhat worse than likely to be from not planning meals well during the pandemic crisis   PLAN:   Trial of taking the Invokana in the evening instead of morning Try to be consistent with low-fat meals Encourage her to start walking outside for exercise Needs follow-up of lipids and A1c in 3 months She will follow-up with PCP for her hypertension  There are no Patient Instructions on file for this visit.       Elayne Snare 03/15/2019, 9:27 PM   Note: This office note was prepared with Dragon voice recognition system technology. Any transcriptional errors that result from this process are unintentional.

## 2019-03-16 ENCOUNTER — Encounter: Payer: Self-pay | Admitting: Endocrinology

## 2019-03-16 ENCOUNTER — Ambulatory Visit (INDEPENDENT_AMBULATORY_CARE_PROVIDER_SITE_OTHER): Payer: BLUE CROSS/BLUE SHIELD | Admitting: Endocrinology

## 2019-03-16 ENCOUNTER — Other Ambulatory Visit: Payer: Self-pay

## 2019-03-16 DIAGNOSIS — E1169 Type 2 diabetes mellitus with other specified complication: Secondary | ICD-10-CM

## 2019-03-16 DIAGNOSIS — E782 Mixed hyperlipidemia: Secondary | ICD-10-CM

## 2019-03-16 DIAGNOSIS — E669 Obesity, unspecified: Secondary | ICD-10-CM | POA: Diagnosis not present

## 2019-03-16 DIAGNOSIS — E049 Nontoxic goiter, unspecified: Secondary | ICD-10-CM | POA: Diagnosis not present

## 2019-03-20 ENCOUNTER — Telehealth: Payer: BLUE CROSS/BLUE SHIELD | Admitting: Physician Assistant

## 2019-03-20 DIAGNOSIS — R300 Dysuria: Secondary | ICD-10-CM

## 2019-03-20 NOTE — Progress Notes (Signed)
We are sorry that you are not feeling well.  Here is how we plan to help!  Based on what you shared with me it looks like you most likely have a simple urinary tract infection.  A UTI (Urinary Tract Infection) is a bacterial infection of the bladder.  Most cases of urinary tract infections are simple to treat but a key part of your care is to encourage you to drink plenty of fluids and watch your symptoms carefully.  I have prescribed MacroBid 100 mg twice a day for 5 days.  Your symptoms should gradually improve. Call us if the burning in your urine worsens, you develop worsening fever, back pain or pelvic pain or if your symptoms do not resolve after completing the antibiotic.  Urinary tract infections can be prevented by drinking plenty of water to keep your body hydrated.  Also be sure when you wipe, wipe from front to back and don't hold it in!  If possible, empty your bladder every 4 hours.  Your e-visit answers were reviewed by a board certified advanced clinical practitioner to complete your personal care plan.  Depending on the condition, your plan could have included both over the counter or prescription medications.  If there is a problem please reply once you have received a response from your provider.  Your safety is important to [place].  If you have drug allergies check your prescription carefully.    You can use MyChart to ask questions about today's visit, request a non-urgent call back, or ask for a work or school excuse for 24 hours related to this e-Visit. If it has been greater than 24 hours you will need to follow up with your provider, or enter a new e-Visit to address those concerns.   You will get an e-mail in the next two days asking about your experience.  I hope that your e-visit has been valuable and will speed your recovery. Thank you for using e-visits.    ===View-only below this line===   ----- Message -----    From: Laverta Baltimore    Sent: 03/20/2019  6:56  PM EDT      To: E-Visit Mailing List Subject: E-Visit Submission: Urinary Problems  E-Visit Submission: Urinary Problems --------------------------------  Question: Which of the following are you experiencing? Answer:   Pain while passing urine  Question: When you have pain when passing urine, which of these apply? Answer:   The pain feels like it is on the inside  Question: Are you able to pass urine? Answer:   Yes, I can pass urine.  Question: How long have you had pain or difficulty passing urine? Answer:   More than one week  Question: Do you have a fever? Answer:   No, I do not have a fever  Question: Do you have any of the following? Answer:   I have belly pain with this illness  Question: Do you have an exaggerated sensation of the need to pass urine? Answer:   Yes, the sensation is exaggerated  Question: Do you have the urge to urinate more of less frequently than normal? Answer:   More frequently  Question: What does your urine look like? Answer:   I am not sure  Question: Do you have any of the following? Answer:   None of the above  Question: Do you have any of the following? Answer:   No discharge  Question: Do you have any sores on your genitals? Answer:   No  Question: Do you have any history of kidney dysfunction or kidney problems? Answer:   No  Question: Within the past 3 months, have you had any surgery on your kidneys or bladder, or have you had a tube inserted to collect your urine? Answer:   No, I have never had either  Question: Have you had similar symptoms in the past? Answer:   No, I have never had  these symptoms  Question: Please list any additional comments  Answer:   Having sharp pains in my bladder when full and when expelling. I am on 2 meds that induce urination, Lisinopril/HCTZ and Invokana.  Question: Please list your medication allergies that you may have ? (If 'none' , please list as 'none') Answer:   None  Question: Are  you pregnant? Answer:   I am confident that I am not pregnant  Question: Are you breastfeeding? Answer:   No  A total of 5-10 minutes was spent evaluating this patients questionnaire and formulating a plan of care.

## 2019-03-22 MED ORDER — NITROFURANTOIN MONOHYD MACRO 100 MG PO CAPS: 100.0000 mg | ORAL_CAPSULE | Freq: Two times a day (BID) | ORAL | 0 refills | Status: AC

## 2019-03-22 NOTE — Addendum Note (Signed)
Addended by: Tereasa Coop on: 03/22/2019 08:26 AM   Modules accepted: Orders

## 2019-03-23 ENCOUNTER — Other Ambulatory Visit: Payer: Self-pay | Admitting: Endocrinology

## 2019-04-13 ENCOUNTER — Other Ambulatory Visit: Payer: Self-pay | Admitting: Family Medicine

## 2019-05-16 ENCOUNTER — Other Ambulatory Visit: Payer: Self-pay

## 2019-05-16 ENCOUNTER — Other Ambulatory Visit (INDEPENDENT_AMBULATORY_CARE_PROVIDER_SITE_OTHER): Payer: BLUE CROSS/BLUE SHIELD

## 2019-05-16 DIAGNOSIS — E782 Mixed hyperlipidemia: Secondary | ICD-10-CM

## 2019-05-16 DIAGNOSIS — E669 Obesity, unspecified: Secondary | ICD-10-CM

## 2019-05-16 DIAGNOSIS — E1169 Type 2 diabetes mellitus with other specified complication: Secondary | ICD-10-CM | POA: Diagnosis not present

## 2019-05-16 DIAGNOSIS — E049 Nontoxic goiter, unspecified: Secondary | ICD-10-CM

## 2019-05-16 LAB — BASIC METABOLIC PANEL
BUN: 14 mg/dL (ref 6–23)
CO2: 26 mEq/L (ref 19–32)
Calcium: 10.4 mg/dL (ref 8.4–10.5)
Chloride: 99 mEq/L (ref 96–112)
Creatinine, Ser: 0.86 mg/dL (ref 0.40–1.20)
GFR: 88.47 mL/min (ref >60.00)
Glucose, Bld: 94 mg/dL (ref 70–99)
Potassium: 3.7 mEq/L (ref 3.5–5.1)
Sodium: 136 mEq/L (ref 135–145)

## 2019-05-16 LAB — HEMOGLOBIN A1C: Hgb A1c MFr Bld: 5.9 % (ref 4.6–6.5)

## 2019-05-16 LAB — LDL CHOLESTEROL, DIRECT: Direct LDL: 87 mg/dL

## 2019-05-16 LAB — TSH: TSH: 1.57 u[IU]/mL (ref 0.35–4.50)

## 2019-05-19 ENCOUNTER — Ambulatory Visit (INDEPENDENT_AMBULATORY_CARE_PROVIDER_SITE_OTHER): Payer: BLUE CROSS/BLUE SHIELD | Admitting: Endocrinology

## 2019-05-19 ENCOUNTER — Encounter: Payer: Self-pay | Admitting: Endocrinology

## 2019-05-19 ENCOUNTER — Other Ambulatory Visit: Payer: Self-pay

## 2019-05-19 ENCOUNTER — Other Ambulatory Visit: Payer: Self-pay | Admitting: Family Medicine

## 2019-05-19 DIAGNOSIS — E1169 Type 2 diabetes mellitus with other specified complication: Secondary | ICD-10-CM

## 2019-05-19 DIAGNOSIS — E669 Obesity, unspecified: Secondary | ICD-10-CM | POA: Diagnosis not present

## 2019-05-19 MED ORDER — CANAGLIFLOZIN 300 MG PO TABS: 300.0000 mg | ORAL_TABLET | Freq: Every day | ORAL | 3 refills | Status: DC

## 2019-05-19 MED ORDER — METFORMIN HCL ER 750 MG PO TB24: 750.0000 mg | ORAL_TABLET | Freq: Two times a day (BID) | ORAL | 1 refills | Status: DC

## 2019-05-19 MED ORDER — METFORMIN HCL ER 750 MG PO TB24: 1500.0000 mg | ORAL_TABLET | Freq: Two times a day (BID) | ORAL | 1 refills | Status: DC

## 2019-05-19 NOTE — Progress Notes (Signed)
Patient ID: Angel Dawson, female   DOB: 07-07-1981, 38 y.o.   MRN: [id number]          Reason for Appointment: Follow-up Type 2 Diabetes  Referring physician: Audi [name]   Today's office visit was provided via telemedicine using video technique Explained to the patient and the the limitations of evaluation and management by telemedicine and the availability of in person appointments.  The patient understood the limitations and agreed to proceed. Patient also understood that the telehealth visit is billable. . Location of the patient: Home . Location of the provider: Office Only the patient and myself were participating in the encounter    History of Present Illness:          Date of diagnosis of type 2 diabetes mellitus:  2015      Background history:   At the time of diagnosis and 2015 she had extreme thirst and urination and was admitted to the hospital with blood sugars over 400 but no overt acidosis However she was discharged only on metformin 500 mg twice a day which she has continued Prior to that in 2014 she appears to have had mild hyperglycemia with blood sugars up to 195  Recent history:    Non-insulin hypoglycemic drugs the patient is taking are: Metformin Er 750 mg twice a day, Invokana 300 mg daily  Her A1c was up to 8.3 in 10/2018 It is now continuing to improve and better at 5.9 compared to 6.3   Current management, blood sugar patterns and problems identified:  She has been taking Invokana before dinnertime on her visit in June because of higher fasting readings  With this her morning sugars are significantly better, previously averaging at least 150  Also her blood sugars in the afternoon are fairly good and she has a couple of readings at night also that are not high  She has had less problem with her back pain and she is trying to do a little more walking, up to 3 miles a week  She is trying to use a fit bit monitor activity  Also trying to  have a little better diet with lower fat intake  She is consistent with her metformin twice a day which she has tolerated now  No yeast infections with Invokana  She thinks her weight is down about 10 pounds compared to 3/20  Side effects from medications have been: Diarrhea from high dose metformin  Glucose monitoring:  done less than 1 times a day         Glucometer:  One Touch    PRE-MEAL Fasting Lunch Dinner Bedtime Overall  Glucose range:  104-163  96-180   105-136   Mean/median:  125     112   POST-MEAL PC Breakfast PC Lunch PC Dinner  Glucose range:   101   Mean/median:  114     Previous readings:  PRE-MEAL Fasting Lunch Dinner Bedtime Overall  Glucose range:  135-174  126-151    98-174  Mean/median:    100   135   POST-MEAL PC Breakfast PC Lunch PC Dinner  Glucose range:   108, 150  98, 101  Mean/median:           Self-care: The diet that the patient has been following is: none Breakfast usually is a protein shake with almond milk, sometimes omelette              Dietician visit, most recent: 05/2017, previously in classes  Weight history:  Wt Readings from Last 3 Encounters:  12/29/18 (!) 409 lb 12.8 oz (185.9 kg)  11/11/18 (!) 413 lb 9.6 oz (187.6 kg)  06/03/18 (!) 411 lb (186.4 kg)    Glycemic control:    Lab Results  Component Value Date   HGBA1C 5.9 05/16/2019   HGBA1C 6.3 03/11/2019   HGBA1C 8.3 (A) 11/11/2018   Lab Results  Component Value Date   MICROALBUR 3.0 (H) 11/11/2018   LDLCALC 106 (H) 03/11/2019   CREATININE 0.86 05/16/2019   Lab Results  Component Value Date   MICRALBCREAT 4.1 11/11/2018    Lab Results  Component Value Date   FRUCTOSAMINE 269 12/27/2018   FRUCTOSAMINE 258 06/12/2017   Lab on 05/16/2019  Component Date Value Ref Range Status  . TSH 05/16/2019 1.57  0.35 - 4.50 uIU/mL Final  . Direct LDL 05/16/2019 87.0  mg/dL Final   Optimal:  <100 mg/dLNear or Above Optimal:  100-129 mg/dLBorderline High:   130-159 mg/dLHigh:  160-189 mg/dLVery High:  >190 mg/dL  . Sodium 05/16/2019 136  135 - 145 mEq/L Final  . Potassium 05/16/2019 3.7  3.5 - 5.1 mEq/L Final  . Chloride 05/16/2019 99  96 - 112 mEq/L Final  . CO2 05/16/2019 26  19 - 32 mEq/L Final  . Glucose, Bld 05/16/2019 94  70 - 99 mg/dL Final  . BUN 05/16/2019 14  6 - 23 mg/dL Final  . Creatinine, Ser 05/16/2019 0.86  0.40 - 1.20 mg/dL Final  . Calcium 05/16/2019 10.4  8.4 - 10.5 mg/dL Final  . GFR 05/16/2019 88.47  >60.00 mL/min Final  . Hgb A1c MFr Bld 05/16/2019 5.9  4.6 - 6.5 % Final   Glycemic Control Guidelines for People with Diabetes:Non Diabetic:  <6%Goal of Therapy: <7%Additional Action Suggested:  >8%       Allergies as of 05/19/2019   Not on File     Medication List       Accurate as of May 19, 2019  3:07 PM. If you have any questions, ask your nurse or doctor.        aspirin-acetaminophen-caffeine 250-250-65 MG tablet Commonly known as: EXCEDRIN MIGRAINE Take 2 tablets by mouth as needed for headache.   Bayer Back & Body 500-32.5 MG Tabs Generic drug: Aspirin-Caffeine Take 2 tablets by mouth as needed.   OneTouch Verio Flex System w/Device Kit 1 each by Does not apply route daily. Use as instructed to check blood sugar once daily.   blood glucose meter kit and supplies Dispense based on patient and insurance preference. Use up to four times daily as directed. (FOR ICD-9 250.00, 250.01).   canagliflozin 300 MG Tabs tablet Commonly known as: Invokana Take 1 tablet (300 mg total) by mouth daily before breakfast.   fluticasone 50 MCG/ACT nasal spray Commonly known as: FLONASE USE 1 SPRAY IN EACH NOSTRIL EVERY DAY   gabapentin 300 MG capsule Commonly known as: NEURONTIN Take 1 capsule (300 mg total) by mouth at bedtime.   glucose blood test strip Use OneTouch Verio Flex test strips as instructed to check blood sugar once daily.   lisinopril-hydrochlorothiazide 20-25 MG tablet Commonly known as:  ZESTORETIC TAKE 1 TABLET BY MOUTH DAILY   metFORMIN 750 MG 24 hr tablet Commonly known as: GLUCOPHAGE-XR TAKE 1 TABLET(750 MG) BY MOUTH DAILY WITH BREAKFAST   OneTouch Delica Lancets 99I Misc 1 each by Does not apply route daily. Use as instructed to check blood sugar once daily.   OneTouch Delica Lancets 33A  Misc Use once daily to check glucose.   PROBIOTIC-10 PO Take 1 capsule by mouth daily. Takes Fortify Women's Digestive Probiotic       Allergies: Not on File  Past Medical History:  Diagnosis Date  . Abdominal pain    related to gallstones  . Achilles tendonitis, bilateral   . Bronchitis    02/17  . Diabetes ([place])   . DKA (diabetic ketoacidoses) ([place]) 08/25/2014  . Gallstones   . GERD (gastroesophageal reflux disease)   . Headache   . Hypertension   . Lattice degeneration, both eyes   . Pancreatitis   . PCOS (polycystic ovarian syndrome)   . Sinus congestion    occ. uses OTC meds for this    Past Surgical History:  Procedure Laterality Date  . CHOLECYSTECTOMY N/A 06/22/2013   Procedure: LAPAROSCOPIC CHOLECYSTECTOMY ;  Surgeon: Adin Hector, MD;  Location: [facility];  Service: General;  Laterality: N/A;  . DILITATION & CURRETTAGE/HYSTROSCOPY WITH HYDROTHERMAL ABLATION N/A 01/03/2016   Procedure: DILATATION & CURETTAGE/HYSTEROSCOPY WITH HYDROTHERMAL ABLATION;  Surgeon: Crawford Givens, MD;  Location: [facility];  Service: Gynecology;  Laterality: N/A;  HTA rep will be attend confirmed by office 12/12/15   . TONSILLECTOMY      Family History  Problem Relation Age of Onset  . Hypertension Mother   . Diabetes Mother   . Goiter Mother   . Hypertension Father   . Diabetes Father   . Asthma Father   . Stroke Paternal Grandfather   . Lung cancer Paternal Grandmother        smoker-heavy  . Diabetes Maternal Aunt   . Colon cancer Neg Hx   . Heart disease Neg Hx     Social History:  reports that she has never smoked. She has never used smokeless tobacco. She  reports current alcohol use. She reports that she does not use drugs.   Review of Systems   Lipid history: Has not been on medication for hyperlipidemia with variable results More recently with somewhat better diet LDL is below 100 again at 87    Lab Results  Component Value Date   CHOL 184 03/11/2019   CHOL 156 10/15/2017   CHOL 168 10/08/2016   Lab Results  Component Value Date   HDL 41.70 03/11/2019   HDL 38.00 (L) 10/15/2017   HDL 41.90 10/08/2016   Lab Results  Component Value Date   LDLCALC 106 (H) 03/11/2019   LDLCALC 90 10/15/2017   [place] 95 10/08/2016   Lab Results  Component Value Date   TRIG 183.0 (H) 03/11/2019   TRIG 143.0 10/15/2017   TRIG 153.0 (H) 10/08/2016   Lab Results  Component Value Date   CHOLHDL 4 03/11/2019   CHOLHDL 4 10/15/2017   CHOLHDL 4 10/08/2016   Lab Results  Component Value Date   LDLDIRECT 87.0 05/16/2019   LDLDIRECT 87.5 09/22/2014            Hypertension: Present for the last 10 years, monitored by PCP  Blood pressure is high This is followed by her PCP but needs follow-up visit Recent blood pressure at home was about 118/90    BP Readings from Last 3 Encounters:  12/29/18 130/90  11/11/18 (!) 124/92  06/03/18 (!) 142/86    Most recent eye exam was 6/18, followed by a retina specialist but no diabetic retinopathy present  She is complaining of tingling and pins-and-needles in her feet somewhat better with gabapentin  Most recent foot exam: 10/2018  LABS:  Lab on 05/16/2019  Component Date Value Ref Range Status  . TSH 05/16/2019 1.57  0.35 - 4.50 uIU/mL Final  . Direct LDL 05/16/2019 87.0  mg/dL Final   Optimal:  <100 mg/dLNear or Above Optimal:  100-129 mg/dLBorderline High:  130-159 mg/dLHigh:  160-189 mg/dLVery High:  >190 mg/dL  . Sodium 05/16/2019 136  135 - 145 mEq/L Final  . Potassium 05/16/2019 3.7  3.5 - 5.1 mEq/L Final  . Chloride 05/16/2019 99  96 - 112 mEq/L Final  . CO2 05/16/2019 26  19 -  32 mEq/L Final  . Glucose, Bld 05/16/2019 94  70 - 99 mg/dL Final  . BUN 05/16/2019 14  6 - 23 mg/dL Final  . Creatinine, Ser 05/16/2019 0.86  0.40 - 1.20 mg/dL Final  . Calcium 05/16/2019 10.4  8.4 - 10.5 mg/dL Final  . GFR 05/16/2019 88.47  >60.00 mL/min Final  . Hgb A1c MFr Bld 05/16/2019 5.9  4.6 - 6.5 % Final   Glycemic Control Guidelines for People with Diabetes:Non Diabetic:  <6%Goal of Therapy: <7%Additional Action Suggested:  >8%     Physical Examination:  There were no vitals taken for this visit.      ASSESSMENT:  Diabetes type 2, with Morbid obesity   See history of present illness for detailed discussion of current diabetes management, blood sugar patterns and problems identified  Her A1c is now 5.9, previously 6.3  With Invokana 300 mg and metformin 1500 mg a day her level of control is progressively improving Blood sugars are close to normal with highest reading at home only once at 180 Fasting readings are better with switching Invokana to dinnertime  She is trying to improve her diet and now starting to walk again Has lost about 10 pounds Does not have postprandial hyperglycemia usually Renal function unchanged with Invokana along with potassium  LIPIDS: Her lipids are somewhat better with improved diet LDL 87   PLAN:   Continue same regimen She will try to continue increasing exercise Check blood sugars at various times of the day as before To call if blood sugars are not consistently controlled Emphasized the need to continue her medications long-term to improve insulin sensitivity and secondary benefits of Invokana  She will follow-up with PCP for her hypertension, likely needs a higher dose of lisinopril and possibly a second treatment  There are no Patient Instructions on file for this visit.       Elayne Snare 05/19/2019, 3:07 PM   Note: This office note was prepared with Dragon voice recognition system technology. Any transcriptional errors  that result from this process are unintentional.

## 2019-05-20 ENCOUNTER — Other Ambulatory Visit: Payer: Self-pay

## 2019-05-20 MED ORDER — LISINOPRIL-HYDROCHLOROTHIAZIDE 20-25 MG PO TABS: 1.0000 | ORAL_TABLET | Freq: Every day | ORAL | 0 refills | Status: DC

## 2019-05-24 NOTE — Progress Notes (Signed)
[facility] [place], [place], [place] [address] [phone]  Date:  05/26/2019   Name:  Angel Dawson   DOB:  1980/11/18   MRN:  [id number]  PCP:  Darreld Mclean, MD    Chief Complaint: Blood Pressure Check (endo wanted follow up, diastolic )   History of Present Illness:  Angel Dawson is a 38 y.o. very pleasant female patient who presents with the following:  Pt with history of DM, OSA, HTN, obesity, gallstone pancreatitis s/p cholecystectomy  Last seen by myself about one year ago  Seen by her endocrinologist Dr. Dwyane Dee just recently- he asked her to see me to discuss her BP   Lab Results  Component Value Date   HGBA1C 5.9 05/16/2019   Eye exam: she will schedule  Pap- may be due- she is seeing Dr Charlesetta Garibaldi next month   She is working from home- has only been into the office a couple of times since March She does not have kids which makes things easier for her during the pandemic  BP Readings from Last 3 Encounters:  05/26/19 128/90  12/29/18 130/90  11/11/18 (!) 124/92   Angel Dawson feels as though her blood pressure is actually okay when she checks it at home Her home BP readings are generally 115- 125/ 85- 95 If her BP goes higher it is generally due to a dietary indiscretion- too much salt  She is taking lisinopril hctz 20/25 She was on coreg but we stopped it last year as it was no longer needed   She is walking about a mile 3x a week- no issues with CP with exercise Her brother - 35 years older- was in the ICU a few years ago with hypertensive emergency and is in renal failure .  She wants to make sure that she does not suffer the same complications  Her P3I has been under excellent control    Patient Active Problem List   Diagnosis Date Noted  . Obstructive sleep apnea treated with continuous positive airway pressure (CPAP) 12/29/2017  . Onychomycosis 10/26/2014  . Gastroesophageal  reflux disease without esophagitis 09/28/2014  . Diabetes mellitus type II, uncontrolled ([place]) 09/28/2014  . Hypertension 08/25/2014  . Morbid obesity ([place]) 08/25/2014  . Chronic rhinitis 06/30/2014  . Pancreatitis, acute 07/02/2013  . Gallstones 04/13/2013    Past Medical History:  Diagnosis Date  . Abdominal pain    related to gallstones  . Achilles tendonitis, bilateral   . Bronchitis    02/17  . Diabetes ([place])   . DKA (diabetic ketoacidoses) ([place]) 08/25/2014  . Gallstones   . GERD (gastroesophageal reflux disease)   . Headache   . Hypertension   . Lattice degeneration, both eyes   . Pancreatitis   . PCOS (polycystic ovarian syndrome)   . Sinus congestion    occ. uses OTC meds for this    Past Surgical History:  Procedure Laterality Date  . CHOLECYSTECTOMY N/A 06/22/2013   Procedure: LAPAROSCOPIC CHOLECYSTECTOMY ;  Surgeon: Adin Hector, MD;  Location: [facility];  Service: General;  Laterality: N/A;  . DILITATION & CURRETTAGE/HYSTROSCOPY WITH HYDROTHERMAL ABLATION N/A 01/03/2016   Procedure: DILATATION & CURETTAGE/HYSTEROSCOPY WITH HYDROTHERMAL ABLATION;  Surgeon: Crawford Givens, MD;  Location: [facility];  Service: Gynecology;  Laterality: N/A;  HTA rep will be attend confirmed by office 12/12/15   . TONSILLECTOMY      Social History   Tobacco Use  .  Smoking status: Never Smoker  . Smokeless tobacco: Never Used  Substance Use Topics  . Alcohol use: Yes    Alcohol/week: 0.0 standard drinks    Comment: 1 drink per month  . Drug use: No    Family History  Problem Relation Age of Onset  . Hypertension Mother   . Diabetes Mother   . Goiter Mother   . Hypertension Father   . Diabetes Father   . Asthma Father   . Stroke Paternal Grandfather   . Lung cancer Paternal Grandmother        smoker-heavy  . Diabetes Maternal Aunt   . Colon cancer Neg Hx   . Heart disease Neg Hx     Not on File  Medication list has been reviewed and updated.  Current Outpatient  Medications on File Prior to Visit  Medication Sig Dispense Refill  . aspirin-acetaminophen-caffeine (EXCEDRIN MIGRAINE) 250-250-65 MG tablet Take 2 tablets by mouth as needed for headache.    . Aspirin-Caffeine (BAYER BACK & BODY) 500-32.5 MG TABS Take 2 tablets by mouth as needed.    . Blood Glucose Monitoring Suppl (BLOOD GLUCOSE METER KIT AND SUPPLIES) Dispense based on patient and insurance preference. Use up to four times daily as directed. (FOR ICD-9 250.00, 250.01). 1 each 0  . Blood Glucose Monitoring Suppl ([place]) w/Device KIT 1 each by Does not apply route daily. Use as instructed to check blood sugar once daily.    . canagliflozin (INVOKANA) 300 MG TABS tablet Take 1 tablet (300 mg total) by mouth daily before breakfast. 30 tablet 3  . fluticasone (FLONASE) 50 MCG/ACT nasal spray USE 1 SPRAY IN EACH NOSTRIL EVERY DAY 16 g 11  . gabapentin (NEURONTIN) 300 MG capsule Take 1 capsule (300 mg total) by mouth at bedtime. 30 capsule 3  . glucose blood test strip Use OneTouch Verio Flex test strips as instructed to check blood sugar once daily. 100 each 3  . lisinopril-hydrochlorothiazide (ZESTORETIC) 20-25 MG tablet Take 1 tablet by mouth daily. 15 tablet 0  . metFORMIN (GLUCOPHAGE-XR) 750 MG 24 hr tablet Take 1 tablet (750 mg total) by mouth 2 (two) times daily. 180 tablet 1  . OneTouch Delica Lancets 33O MISC 1 each by Does not apply route daily. Use as instructed to check blood sugar once daily.    Glory Rosebush Delica Lancets 32N MISC Use once daily to check glucose. 100 each 1  . Probiotic Product (PROBIOTIC-10 PO) Take 1 capsule by mouth daily. Takes Fortify Women's Digestive Probiotic     No current facility-administered medications on file prior to visit.     Review of Systems:  As per HPI- otherwise negative. No CP or SOB She does get HA on occasion- she will treat with OTC meds and resolved   Wt Readings from Last 3 Encounters:  05/26/19 (!) 403 lb (182.8  kg)  12/29/18 (!) 409 lb 12.8 oz (185.9 kg)  11/11/18 (!) 413 lb 9.6 oz (187.6 kg)     Physical Examination: Vitals:   05/26/19 0906  BP: 128/90  Pulse: 96  Resp: 17  Temp: (!) 96.8 F (36 C)  SpO2: 98%   Vitals:   05/26/19 0906  Weight: (!) 403 lb (182.8 kg)   Body mass index is 56.21 kg/m. Ideal Body Weight:    GEN: WDWN, NAD, Non-toxic, A & O x 3, obese and tall build.  Looks well HEENT: Atraumatic, Normocephalic. Neck supple. No masses, No LAD.  TM wnl bilaterally  Ears and Nose: No external deformity. CV: RRR, No M/G/R. No JVD. No thrill. No extra heart sounds. PULM: CTA B, no wheezes, crackles, rhonchi. No retractions. No resp. distress. No accessory muscle use. ABD: S, NT, ND, +BS. No rebound. No HSM. EXTR: No c/c/e NEURO Normal gait.  PSYCH: Normally interactive. Conversant. Not depressed or anxious appearing.  Calm demeanor.   zestoretic invokana neurontin Metformin   EKG: c/w tracing from 9/14- NSR, first-degree AV block which is unchanged.  Low voltage in chest leads Assessment and Plan:   ICD-10-CM   1. Essential hypertension, benign  I10 EKG 12-Lead    lisinopril (ZESTRIL) 10 MG tablet    lisinopril-hydrochlorothiazide (ZESTORETIC) 20-25 MG tablet  2. Controlled type 2 diabetes mellitus with other specified complication, without long-term current use of insulin (HCC)  E11.69   3. Obstructive sleep apnea treated with continuous positive airway pressure (CPAP)  G47.33    Z99.89    Following up today discuss her blood pressure.  Maleia is currently taking lisinopril/HCTZ.  Her pressure is sometimes borderline, but generally still reasonable  She is starting exercise program and hopes to lose weight and lower her blood pressure this way She is hesitant to add further medication, did accept a prescription for 10 mg of lisinopril.  I have asked her to monitor her blood pressure, add an extra 5 to 10 mg of lisinopril if needed for blood pressure higher than  130/90  Offered an echocardiogram, for the time being she declines Diabetes under very good control Her sleep apnea is monitored by neurology, uses CPAP  Follow-up: No follow-ups on file.  Meds ordered this encounter  Medications  . lisinopril (ZESTRIL) 10 MG tablet    Sig: Take 1 tablet (10 mg total) by mouth daily. Use as needed for elevated BP    Dispense:  30 tablet    Refill:  3  . lisinopril-hydrochlorothiazide (ZESTORETIC) 20-25 MG tablet    Sig: Take 1 tablet by mouth daily.    Dispense:  90 tablet    Refill:  3   Orders Placed This Encounter  Procedures  . EKG 12-Lead    @SIGN @  Outpatient Encounter Medications as of 05/26/2019  Medication Sig  . aspirin-acetaminophen-caffeine (EXCEDRIN MIGRAINE) 250-250-65 MG tablet Take 2 tablets by mouth as needed for headache.  . Aspirin-Caffeine (BAYER BACK & BODY) 500-32.5 MG TABS Take 2 tablets by mouth as needed.  . Blood Glucose Monitoring Suppl (BLOOD GLUCOSE METER KIT AND SUPPLIES) Dispense based on patient and insurance preference. Use up to four times daily as directed. (FOR ICD-9 250.00, 250.01).  . Blood Glucose Monitoring Suppl ([place]) w/Device KIT 1 each by Does not apply route daily. Use as instructed to check blood sugar once daily.  . canagliflozin (INVOKANA) 300 MG TABS tablet Take 1 tablet (300 mg total) by mouth daily before breakfast.  . fluticasone (FLONASE) 50 MCG/ACT nasal spray USE 1 SPRAY IN EACH NOSTRIL EVERY DAY  . gabapentin (NEURONTIN) 300 MG capsule Take 1 capsule (300 mg total) by mouth at bedtime.  Marland Kitchen glucose blood test strip Use OneTouch Verio Flex test strips as instructed to check blood sugar once daily.  Marland Kitchen lisinopril-hydrochlorothiazide (ZESTORETIC) 20-25 MG tablet Take 1 tablet by mouth daily.  . metFORMIN (GLUCOPHAGE-XR) 750 MG 24 hr tablet Take 1 tablet (750 mg total) by mouth 2 (two) times daily.  Glory Rosebush Delica Lancets 03K MISC 1 each by Does not apply route daily. Use  as instructed to check  blood sugar once daily.  Glory Rosebush Delica Lancets 35O MISC Use once daily to check glucose.  . Probiotic Product (PROBIOTIC-10 PO) Take 1 capsule by mouth daily. Takes Fortify Women's Digestive Probiotic  . [DISCONTINUED] lisinopril-hydrochlorothiazide (ZESTORETIC) 20-25 MG tablet Take 1 tablet by mouth daily.  Marland Kitchen lisinopril (ZESTRIL) 10 MG tablet Take 1 tablet (10 mg total) by mouth daily. Use as needed for elevated BP   No facility-administered encounter medications on file as of 05/26/2019.     We discused an echo- for the time being she declines but would like to do perhaps in a few months Signed Lamar Blinks, MD

## 2019-05-26 ENCOUNTER — Other Ambulatory Visit: Payer: Self-pay

## 2019-05-26 ENCOUNTER — Encounter: Payer: Self-pay | Admitting: Family Medicine

## 2019-05-26 ENCOUNTER — Ambulatory Visit: Payer: BLUE CROSS/BLUE SHIELD | Admitting: Family Medicine

## 2019-05-26 VITALS — BP 128/90 | HR 96 | Temp 96.8°F | Resp 17 | Wt >= 6400 oz

## 2019-05-26 DIAGNOSIS — E1169 Type 2 diabetes mellitus with other specified complication: Secondary | ICD-10-CM

## 2019-05-26 DIAGNOSIS — I10 Essential (primary) hypertension: Secondary | ICD-10-CM | POA: Diagnosis not present

## 2019-05-26 DIAGNOSIS — Z9989 Dependence on other enabling machines and devices: Secondary | ICD-10-CM | POA: Diagnosis not present

## 2019-05-26 DIAGNOSIS — G4733 Obstructive sleep apnea (adult) (pediatric): Secondary | ICD-10-CM

## 2019-05-26 MED ORDER — LISINOPRIL-HYDROCHLOROTHIAZIDE 20-25 MG PO TABS: 1.0000 | ORAL_TABLET | Freq: Every day | ORAL | 3 refills | Status: DC

## 2019-05-26 MED ORDER — LISINOPRIL 10 MG PO TABS: 10.0000 mg | ORAL_TABLET | Freq: Every day | ORAL | 3 refills | Status: DC

## 2019-05-26 NOTE — Patient Instructions (Signed)
Great to see you again today Please continue to monitor your BP If your numbers are over 130/90, take an extra 5- 10 mg of lisinopril Please let me know how you respond to this med adjustment I am glad to do a cardiac echo for you at your convenience Keep up the good work with exercise!

## 2019-06-23 DIAGNOSIS — Z6841 Body Mass Index (BMI) 40.0 and over, adult: Secondary | ICD-10-CM | POA: Diagnosis not present

## 2019-06-23 DIAGNOSIS — Z124 Encounter for screening for malignant neoplasm of cervix: Secondary | ICD-10-CM | POA: Diagnosis not present

## 2019-06-23 DIAGNOSIS — Z01419 Encounter for gynecological examination (general) (routine) without abnormal findings: Secondary | ICD-10-CM | POA: Diagnosis not present

## 2019-06-23 DIAGNOSIS — Z1231 Encounter for screening mammogram for malignant neoplasm of breast: Secondary | ICD-10-CM | POA: Diagnosis not present

## 2019-06-23 DIAGNOSIS — N390 Urinary tract infection, site not specified: Secondary | ICD-10-CM | POA: Diagnosis not present

## 2019-07-28 DIAGNOSIS — D259 Leiomyoma of uterus, unspecified: Secondary | ICD-10-CM | POA: Diagnosis not present

## 2019-07-28 DIAGNOSIS — R102 Pelvic and perineal pain: Secondary | ICD-10-CM | POA: Diagnosis not present

## 2019-08-04 ENCOUNTER — Other Ambulatory Visit: Payer: Self-pay

## 2019-08-04 ENCOUNTER — Other Ambulatory Visit: Payer: Self-pay | Admitting: Endocrinology

## 2019-08-04 DIAGNOSIS — Z20822 Contact with and (suspected) exposure to covid-19: Secondary | ICD-10-CM

## 2019-08-06 LAB — NOVEL CORONAVIRUS, NAA: SARS-CoV-2, NAA: NOT DETECTED

## 2019-09-02 ENCOUNTER — Other Ambulatory Visit: Payer: Self-pay | Admitting: Endocrinology

## 2019-09-13 ENCOUNTER — Other Ambulatory Visit: Payer: Self-pay | Admitting: Endocrinology

## 2019-10-05 ENCOUNTER — Ambulatory Visit: Payer: BLUE CROSS/BLUE SHIELD | Attending: Internal Medicine

## 2019-10-05 DIAGNOSIS — Z20828 Contact with and (suspected) exposure to other viral communicable diseases: Secondary | ICD-10-CM | POA: Diagnosis not present

## 2019-10-05 DIAGNOSIS — Z20822 Contact with and (suspected) exposure to covid-19: Secondary | ICD-10-CM

## 2019-10-06 LAB — NOVEL CORONAVIRUS, NAA: SARS-CoV-2, NAA: NOT DETECTED

## 2019-10-10 ENCOUNTER — Other Ambulatory Visit: Payer: BLUE CROSS/BLUE SHIELD

## 2019-10-25 DIAGNOSIS — M542 Cervicalgia: Secondary | ICD-10-CM | POA: Diagnosis not present

## 2019-10-25 DIAGNOSIS — M25512 Pain in left shoulder: Secondary | ICD-10-CM | POA: Diagnosis not present

## 2019-11-15 LAB — HM PAP SMEAR: HM Pap smear: NORMAL

## 2019-11-15 LAB — RESULTS CONSOLE HPV: CHL HPV: NEGATIVE

## 2019-12-12 ENCOUNTER — Other Ambulatory Visit: Payer: Self-pay

## 2019-12-12 MED ORDER — GABAPENTIN 300 MG PO CAPS: ORAL_CAPSULE | ORAL | 0 refills | Status: DC

## 2019-12-12 MED ORDER — METFORMIN HCL ER 750 MG PO TB24: 750.0000 mg | ORAL_TABLET | Freq: Every day | ORAL | 0 refills | Status: DC

## 2019-12-15 ENCOUNTER — Other Ambulatory Visit: Payer: Self-pay

## 2019-12-15 DIAGNOSIS — I10 Essential (primary) hypertension: Secondary | ICD-10-CM

## 2019-12-15 MED ORDER — LISINOPRIL-HYDROCHLOROTHIAZIDE 20-25 MG PO TABS: 1.0000 | ORAL_TABLET | Freq: Every day | ORAL | 1 refills | Status: DC

## 2020-01-08 ENCOUNTER — Other Ambulatory Visit: Payer: Self-pay | Admitting: Endocrinology

## 2020-02-17 ENCOUNTER — Other Ambulatory Visit: Payer: Self-pay

## 2020-02-17 ENCOUNTER — Other Ambulatory Visit (INDEPENDENT_AMBULATORY_CARE_PROVIDER_SITE_OTHER): Payer: Self-pay

## 2020-02-17 DIAGNOSIS — E1169 Type 2 diabetes mellitus with other specified complication: Secondary | ICD-10-CM

## 2020-02-17 DIAGNOSIS — E669 Obesity, unspecified: Secondary | ICD-10-CM

## 2020-02-17 LAB — COMPREHENSIVE METABOLIC PANEL
ALT: 18 U/L (ref 0–35)
AST: 18 U/L (ref 0–37)
Albumin: 4.4 g/dL (ref 3.5–5.2)
Alkaline Phosphatase: 71 U/L (ref 39–117)
BUN: 17 mg/dL (ref 6–23)
CO2: 26 mEq/L (ref 19–32)
Calcium: 10.2 mg/dL (ref 8.4–10.5)
Chloride: 105 mEq/L (ref 96–112)
Creatinine, Ser: 0.98 mg/dL (ref 0.40–1.20)
GFR: 75.8 mL/min (ref >60.00)
Glucose, Bld: 136 mg/dL — ABNORMAL HIGH (ref 70–99)
Potassium: 4.1 mEq/L (ref 3.5–5.1)
Sodium: 135 mEq/L (ref 135–145)
Total Bilirubin: 0.4 mg/dL (ref 0.2–1.2)
Total Protein: 7.6 g/dL (ref 6.0–8.3)

## 2020-02-17 LAB — HEMOGLOBIN A1C: Hgb A1c MFr Bld: 6.5 % (ref 4.6–6.5)

## 2020-02-17 LAB — MICROALBUMIN / CREATININE URINE RATIO
Creatinine,U: 65.3 mg/dL
Microalb Creat Ratio: 2.3 mg/g (ref 0.0–30.0)
Microalb, Ur: 1.5 mg/dL (ref 0.0–1.9)

## 2020-02-21 ENCOUNTER — Other Ambulatory Visit: Payer: Self-pay

## 2020-02-21 ENCOUNTER — Encounter: Payer: Self-pay | Admitting: Endocrinology

## 2020-02-21 ENCOUNTER — Telehealth (INDEPENDENT_AMBULATORY_CARE_PROVIDER_SITE_OTHER): Payer: Self-pay | Admitting: Endocrinology

## 2020-02-21 DIAGNOSIS — E1169 Type 2 diabetes mellitus with other specified complication: Secondary | ICD-10-CM

## 2020-02-21 DIAGNOSIS — I10 Essential (primary) hypertension: Secondary | ICD-10-CM

## 2020-02-21 DIAGNOSIS — E669 Obesity, unspecified: Secondary | ICD-10-CM

## 2020-02-21 NOTE — Progress Notes (Signed)
Patient ID: Angel Dawson, female   DOB: [birth year], 39 y.o.   MRN: [id number]          Reason for Appointment: Follow-up Type 2 Diabetes  Referring physician: Aleen [name]   I connected with the above-named patient by video enabled telemedicine application and verified that I am speaking with the correct person. The patient was explained the limitations of evaluation and management by telemedicine and the availability of in person appointments.  Patient also understood that there may be a patient responsible charge related to this service . Location of the patient: Patient's home . Location of the provider: Physician office Only the patient and myself were participating in the encounter The patient understood the above statements and agreed to proceed.  History of Present Illness:          Date of diagnosis of type 2 diabetes mellitus:  2015      Background history:   At the time of diagnosis and 2015 she had extreme thirst and urination and was admitted to the hospital with blood sugars over 400 but no overt acidosis However she was discharged only on metformin 500 mg twice a day which she has continued Prior to that in 2014 she appears to have had mild hyperglycemia with blood sugars up to 195  Recent history:    Non-insulin hypoglycemic drugs the patient is taking are: Metformin Er 750 mg twice a day, Invokana 300 mg daily  Her A1c was up to 8.3 in 10/2018  A1c is now 6.5  She has not been seen in follow-up since 05/2019   Current management, blood sugar patterns and problems identified:  She has been taking Invokana at bedtime instead of at dinnertime as she was doing before  However because of various reasons she has not checked her sugar in about a month  Also in the last couple of months she has checked it only 2 times  She is not exercising  She is not always planning her meals and eating fast food or takeout meals frequently  She says that otherwise she  tries to eat more protein at meals  Sometimes will only eat bacon at breakfast  Her weight is about 7 pounds more than previous weight last year  Side effects from medications have been: Diarrhea from high dose metformin  Glucose monitoring:  done less than 1 times a day         Glucometer:  One Touch   Home blood sugar range 97-169 with only 3 readings since February  PRE-MEAL Fasting Lunch Dinner Bedtime Overall  Glucose range:  104-163  96-180   105-136   Mean/median:  125     112   POST-MEAL PC Breakfast PC Lunch PC Dinner  Glucose range:   101   Mean/median:  114          Self-care: The diet that the patient has been following is: none Breakfast grits, bacon, sometimes omelette              Dietician visit, most recent: 05/2017, previously in classes                Weight history:  Wt Readings from Last 3 Encounters:  05/26/19 (!) 403 lb (182.8 kg)  12/29/18 (!) 409 lb 12.8 oz (185.9 kg)  11/11/18 (!) 413 lb 9.6 oz (187.6 kg)    Glycemic control:    Lab Results  Component Value Date   HGBA1C 6.5 02/17/2020   HGBA1C 5.9  05/16/2019   HGBA1C 6.3 03/11/2019   Lab Results  Component Value Date   MICROALBUR 1.5 02/17/2020   LDLCALC 106 (H) 03/11/2019   CREATININE 0.98 02/17/2020   Lab Results  Component Value Date   MICRALBCREAT 2.3 02/17/2020    Lab Results  Component Value Date   FRUCTOSAMINE 269 12/27/2018   FRUCTOSAMINE 258 06/12/2017   Lab on 02/17/2020  Component Date Value Ref Range Status  . Microalb, Ur 02/17/2020 1.5  0.0 - 1.9 mg/dL Final  . Creatinine,U 02/17/2020 65.3  mg/dL Final  . Microalb Creat Ratio 02/17/2020 2.3  0.0 - 30.0 mg/g Final  . Sodium 02/17/2020 135  135 - 145 mEq/L Final  . Potassium 02/17/2020 4.1  3.5 - 5.1 mEq/L Final  . Chloride 02/17/2020 105  96 - 112 mEq/L Final  . CO2 02/17/2020 26  19 - 32 mEq/L Final  . Glucose, Bld 02/17/2020 136* 70 - 99 mg/dL Final  . BUN 02/17/2020 17  6 - 23 mg/dL Final  . Creatinine,  Ser 02/17/2020 0.98  0.40 - 1.20 mg/dL Final  . Total Bilirubin 02/17/2020 0.4  0.2 - 1.2 mg/dL Final  . Alkaline Phosphatase 02/17/2020 71  39 - 117 U/L Final  . AST 02/17/2020 18  0 - 37 U/L Final  . ALT 02/17/2020 18  0 - 35 U/L Final  . Total Protein 02/17/2020 7.6  6.0 - 8.3 g/dL Final  . Albumin 02/17/2020 4.4  3.5 - 5.2 g/dL Final  . GFR 02/17/2020 75.80  >60.00 mL/min Final  . Calcium 02/17/2020 10.2  8.4 - 10.5 mg/dL Final  . Hgb A1c MFr Bld 02/17/2020 6.5  4.6 - 6.5 % Final   Glycemic Control Guidelines for People with Diabetes:Non Diabetic:  <6%Goal of Therapy: <7%Additional Action Suggested:  >8%       Allergies as of 02/21/2020   Not on File     Medication List       Accurate as of Feb 21, 2020 12:57 PM. If you have any questions, ask your nurse or doctor.        aspirin-acetaminophen-caffeine 250-250-65 MG tablet Commonly known as: EXCEDRIN MIGRAINE Take 2 tablets by mouth as needed for headache.   Bayer Back & Body 500-32.5 MG Tabs Generic drug: Aspirin-Caffeine Take 2 tablets by mouth as needed.   OneTouch Verio Flex System w/Device Kit 1 each by Does not apply route daily. Use as instructed to check blood sugar once daily.   blood glucose meter kit and supplies Dispense based on patient and insurance preference. Use up to four times daily as directed. (FOR ICD-9 250.00, 250.01).   fluticasone 50 MCG/ACT nasal spray Commonly known as: FLONASE USE 1 SPRAY IN EACH NOSTRIL EVERY DAY   gabapentin 300 MG capsule Commonly known as: NEURONTIN Take 1 capsule by mouth once daily at bedtime. **needs appt for further refills.**   Invokana 300 MG Tabs tablet Generic drug: canagliflozin TAKE 1 TABLET BY MOUTH DAILY( BEFORE BREAKFAST)   lisinopril 10 MG tablet Commonly known as: ZESTRIL Take 1 tablet (10 mg total) by mouth daily. Use as needed for elevated BP   lisinopril-hydrochlorothiazide 20-25 MG tablet Commonly known as: ZESTORETIC Take 1 tablet by  mouth daily.   metFORMIN 750 MG 24 hr tablet Commonly known as: GLUCOPHAGE-XR Take 1 tablet (750 mg total) by mouth daily with breakfast. Needs appt for further refills.   OneTouch Delica Lancets 10Y Misc 1 each by Does not apply route daily. Use as instructed to check  blood sugar once daily.   OneTouch Delica Lancets 05W Misc Use once daily to check glucose.   OneTouch Verio test strip Generic drug: glucose blood USE TO TEST BLOOD SUGAR ONCE DAILY   PROBIOTIC-10 PO Take 1 capsule by mouth daily. Takes Fortify Women's Digestive Probiotic       Allergies: Not on File  Past Medical History:  Diagnosis Date  . Abdominal pain    related to gallstones  . Achilles tendonitis, bilateral   . Bronchitis    02/17  . Diabetes ([place])   . DKA (diabetic ketoacidoses) ([place]) 08/25/2014  . Gallstones   . GERD (gastroesophageal reflux disease)   . Headache   . Hypertension   . Lattice degeneration, both eyes   . Pancreatitis   . PCOS (polycystic ovarian syndrome)   . Sinus congestion    occ. uses OTC meds for this    Past Surgical History:  Procedure Laterality Date  . CHOLECYSTECTOMY N/A 06/22/2013   Procedure: LAPAROSCOPIC CHOLECYSTECTOMY ;  Surgeon: Adin Hector, MD;  Location: [facility];  Service: General;  Laterality: N/A;  . DILITATION & CURRETTAGE/HYSTROSCOPY WITH HYDROTHERMAL ABLATION N/A 01/03/2016   Procedure: DILATATION & CURETTAGE/HYSTEROSCOPY WITH HYDROTHERMAL ABLATION;  Surgeon: Crawford Givens, MD;  Location: [facility];  Service: Gynecology;  Laterality: N/A;  HTA rep will be attend confirmed by office 12/12/15   . TONSILLECTOMY      Family History  Problem Relation Age of Onset  . Hypertension Mother   . Diabetes Mother   . Goiter Mother   . Hypertension Father   . Diabetes Father   . Asthma Father   . Stroke Paternal Grandfather   . Lung cancer Paternal Grandmother        smoker-heavy  . Diabetes Maternal Aunt   . Colon cancer Neg Hx   . Heart disease Neg Hx      Social History:  reports that she has never smoked. She has never used smokeless tobacco. She reports current alcohol use. She reports that she does not use drugs.   Review of Systems   Lipid history: Has not been on medication for hyperlipidemia with variable results LDL is below 100, last at 87    Lab Results  Component Value Date   CHOL 184 03/11/2019   CHOL 156 10/15/2017   CHOL 168 10/08/2016   Lab Results  Component Value Date   HDL 41.70 03/11/2019   HDL 38.00 (L) 10/15/2017   HDL 41.90 10/08/2016   Lab Results  Component Value Date   LDLCALC 106 (H) 03/11/2019   LDLCALC 90 10/15/2017   [facility] 95 10/08/2016   Lab Results  Component Value Date   TRIG 183.0 (H) 03/11/2019   TRIG 143.0 10/15/2017   TRIG 153.0 (H) 10/08/2016   Lab Results  Component Value Date   CHOLHDL 4 03/11/2019   CHOLHDL 4 10/15/2017   CHOLHDL 4 10/08/2016   Lab Results  Component Value Date   LDLDIRECT 87.0 05/16/2019   LDLDIRECT 87.5 09/22/2014            Hypertension: Present for the last 10 years, monitored by PCP  This is followed by her PCP but has not been seen for office visit for several months She is taking both lisinopril and lisinopril HCTZ  Recent blood pressure at home was about 130/90    BP Readings from Last 3 Encounters:  05/26/19 128/90  12/29/18 130/90  11/11/18 (!) 124/92    Most recent eye exam was 6/18, previously  followed by a retina specialist but no diabetic retinopathy present  Has history of paresthesiae in her feet treated with gabapentin  Most recent foot exam: 10/2018   LABS:  Lab on 02/17/2020  Component Date Value Ref Range Status  . Microalb, Ur 02/17/2020 1.5  0.0 - 1.9 mg/dL Final  . Creatinine,U 02/17/2020 65.3  mg/dL Final  . Microalb Creat Ratio 02/17/2020 2.3  0.0 - 30.0 mg/g Final  . Sodium 02/17/2020 135  135 - 145 mEq/L Final  . Potassium 02/17/2020 4.1  3.5 - 5.1 mEq/L Final  . Chloride 02/17/2020 105  96 - 112 mEq/L  Final  . CO2 02/17/2020 26  19 - 32 mEq/L Final  . Glucose, Bld 02/17/2020 136* 70 - 99 mg/dL Final  . BUN 02/17/2020 17  6 - 23 mg/dL Final  . Creatinine, Ser 02/17/2020 0.98  0.40 - 1.20 mg/dL Final  . Total Bilirubin 02/17/2020 0.4  0.2 - 1.2 mg/dL Final  . Alkaline Phosphatase 02/17/2020 71  39 - 117 U/L Final  . AST 02/17/2020 18  0 - 37 U/L Final  . ALT 02/17/2020 18  0 - 35 U/L Final  . Total Protein 02/17/2020 7.6  6.0 - 8.3 g/dL Final  . Albumin 02/17/2020 4.4  3.5 - 5.2 g/dL Final  . GFR 02/17/2020 75.80  >60.00 mL/min Final  . Calcium 02/17/2020 10.2  8.4 - 10.5 mg/dL Final  . Hgb A1c MFr Bld 02/17/2020 6.5  4.6 - 6.5 % Final   Glycemic Control Guidelines for People with Diabetes:Non Diabetic:  <6%Goal of Therapy: <7%Additional Action Suggested:  >8%     Physical Examination:  There were no vitals taken for this visit.      ASSESSMENT:  Diabetes type 2, with Morbid obesity   See history of present illness for detailed discussion of current diabetes management, blood sugar patterns and problems identified  Her A1c is now 6.5  With Invokana 300 mg and metformin 1500 mg daily she has had stable control  However blood sugar monitoring has been minimal She also has gained weight Not motivated to exercise recently Discussed her meal planning and she can do much better with planning her meals well, avoiding fast food, higher fat intake and eating balanced meals Also likely can do better with postprandial control if she takes her Invokana before dinner instead of bedtime  Urine microalbumin normal  LIPIDS: Needs follow-up LDL 87  Hypertension: Does not appear to be adequately controlled  She will follow-up with PCP for her hypertension, does need to improve her diet with low sodium intake from fast foods Needs to have diastolic blood pressure around 80 considering her age   PLAN:   No change in medication regimen Take Invokana before dinner instead of  bedtime Start exercising even if she can do some online video exercise program Avoid high-fat foods and she will need to start using the calorie Edison Pace app to help her with better choices with lower caloric intake as well as restricting sodium especially with fast food  Needs to restart glucose monitoring Check blood sugars at least 3 times a week at various times including after meals or fasting   LIPIDS: Needs follow-up lab on the next visit  Obesity: Would like to discuss bariatric surgery with her on the next visit  There are no Patient Instructions on file for this visit.       Elayne Snare 02/21/2020, 12:57 PM   Note: This office note was prepared with Dragon voice  recognition system technology. Any transcriptional errors that result from this process are unintentional.

## 2020-03-25 NOTE — Progress Notes (Deleted)
[place] at [facility] [address], [place], [place] [address] [phone]  Date:  03/26/2020   Name:  Angel Dawson   DOB:  Jun 23, 1981   MRN:  [id number]  PCP:  Darreld Mclean, MD    Chief Complaint: No chief complaint on file.   History of Present Illness:  Angel Dawson is a 39 y.o. very pleasant female patient who presents with the following:  Pt with history of OSA, HTN, DM, obesity here today for a routine CPE Last seen by myself August of 2020 Her endocrinologist is Dr Dwyane Dee GYN is DR Charlesetta Garibaldi  covid series Pap mammo Eye exam Foot exam Most recent labs on chart from last month - A1c under good control  Needs lipids, CBC, thyroid   Patient Active Problem List   Diagnosis Date Noted  . Obstructive sleep apnea treated with continuous positive airway pressure (CPAP) 12/29/2017  . Onychomycosis 10/26/2014  . Gastroesophageal reflux disease without esophagitis 09/28/2014  . Diabetes mellitus type II, uncontrolled ([place]) 09/28/2014  . Hypertension 08/25/2014  . Morbid obesity ([place]) 08/25/2014  . Chronic rhinitis 06/30/2014  . Pancreatitis, acute 07/02/2013  . Gallstones 04/13/2013    Past Medical History:  Diagnosis Date  . Abdominal pain    related to gallstones  . Achilles tendonitis, bilateral   . Bronchitis    02/17  . Diabetes ([place])   . DKA (diabetic ketoacidoses) ([place]) 08/25/2014  . Gallstones   . GERD (gastroesophageal reflux disease)   . Headache   . Hypertension   . Lattice degeneration, both eyes   . Pancreatitis   . PCOS (polycystic ovarian syndrome)   . Sinus congestion    occ. uses OTC meds for this    Past Surgical History:  Procedure Laterality Date  . CHOLECYSTECTOMY N/A 06/22/2013   Procedure: LAPAROSCOPIC CHOLECYSTECTOMY ;  Surgeon: Adin Hector, MD;  Location: [facility];  Service: General;  Laterality: N/A;  . DILITATION & CURRETTAGE/HYSTROSCOPY WITH HYDROTHERMAL ABLATION  N/A 01/03/2016   Procedure: DILATATION & CURETTAGE/HYSTEROSCOPY WITH HYDROTHERMAL ABLATION;  Surgeon: Crawford Givens, MD;  Location: [facility];  Service: Gynecology;  Laterality: N/A;  HTA rep will be attend confirmed by office 12/12/15   . TONSILLECTOMY      Social History   Tobacco Use  . Smoking status: Never Smoker  . Smokeless tobacco: Never Used  Substance Use Topics  . Alcohol use: Yes    Alcohol/week: 0.0 standard drinks    Comment: 1 drink per month  . Drug use: No    Family History  Problem Relation Age of Onset  . Hypertension Mother   . Diabetes Mother   . Goiter Mother   . Hypertension Father   . Diabetes Father   . Asthma Father   . Stroke Paternal Grandfather   . Lung cancer Paternal Grandmother        smoker-heavy  . Diabetes Maternal Aunt   . Colon cancer Neg Hx   . Heart disease Neg Hx     Not on File  Medication list has been reviewed and updated.  Current Outpatient Medications on File Prior to Visit  Medication Sig Dispense Refill  . aspirin-acetaminophen-caffeine (EXCEDRIN MIGRAINE) 250-250-65 MG tablet Take 2 tablets by mouth as needed for headache.    . Aspirin-Caffeine (BAYER BACK & BODY) 500-32.5 MG TABS Take 2 tablets by mouth as needed.    . Blood Glucose Monitoring Suppl (BLOOD GLUCOSE METER KIT AND  SUPPLIES) Dispense based on patient and insurance preference. Use up to four times daily as directed. (FOR ICD-9 250.00, 250.01). 1 each 0  . Blood Glucose Monitoring Suppl ([place]) w/Device KIT 1 each by Does not apply route daily. Use as instructed to check blood sugar once daily.    . fluticasone (FLONASE) 50 MCG/ACT nasal spray USE 1 SPRAY IN EACH NOSTRIL EVERY DAY 16 g 11  . gabapentin (NEURONTIN) 300 MG capsule Take 1 capsule by mouth once daily at bedtime. **needs appt for further refills.** 90 capsule 0  . INVOKANA 300 MG TABS tablet TAKE 1 TABLET BY MOUTH DAILY( BEFORE BREAKFAST) (Patient taking differently: Take 300 mg by  mouth at bedtime. Take 1 tablet by mouth daily at bedtime.) 30 tablet 3  . lisinopril (ZESTRIL) 10 MG tablet Take 1 tablet (10 mg total) by mouth daily. Use as needed for elevated BP 30 tablet 3  . lisinopril-hydrochlorothiazide (ZESTORETIC) 20-25 MG tablet Take 1 tablet by mouth daily. 90 tablet 1  . metFORMIN (GLUCOPHAGE-XR) 750 MG 24 hr tablet Take 1 tablet (750 mg total) by mouth daily with breakfast. Needs appt for further refills. (Patient taking differently: Take 750 mg by mouth in the morning and at bedtime. Take 1 tablet by mouth twice daily.) 90 tablet 0  . OneTouch Delica Lancets 70V MISC 1 each by Does not apply route daily. Use as instructed to check blood sugar once daily.    Glory Rosebush Delica Lancets 77L MISC Use once daily to check glucose. 100 each 1  . ONETOUCH VERIO test strip USE TO TEST BLOOD SUGAR ONCE DAILY 100 strip 2  . Probiotic Product (PROBIOTIC-10 PO) Take 1 capsule by mouth daily. Takes Fortify Women's Digestive Probiotic     No current facility-administered medications on file prior to visit.    Review of Systems:  As per HPI- otherwise negative.   Physical Examination: There were no vitals filed for this visit. There were no vitals filed for this visit. There is no height or weight on file to calculate BMI. Ideal Body Weight:    GEN: no acute distress. HEENT: Atraumatic, Normocephalic.  Ears and Nose: No external deformity. CV: RRR, No M/G/R. No JVD. No thrill. No extra heart sounds. PULM: CTA B, no wheezes, crackles, rhonchi. No retractions. No resp. distress. No accessory muscle use. ABD: S, NT, ND, +BS. No rebound. No HSM. EXTR: No c/c/e PSYCH: Normally interactive. Conversant.    Assessment and Plan: *** This visit occurred during the SARS-CoV-2 public health emergency.  Safety protocols were in place, including screening questions prior to the visit, additional usage of staff PPE, and extensive cleaning of exam room while observing appropriate  contact time as indicated for disinfecting solutions.    Signed Lamar Blinks, MD

## 2020-03-26 ENCOUNTER — Ambulatory Visit (INDEPENDENT_AMBULATORY_CARE_PROVIDER_SITE_OTHER): Payer: Self-pay | Admitting: Family Medicine

## 2020-03-26 DIAGNOSIS — Z5329 Procedure and treatment not carried out because of patient's decision for other reasons: Secondary | ICD-10-CM

## 2020-06-08 ENCOUNTER — Other Ambulatory Visit: Payer: Self-pay

## 2020-06-12 ENCOUNTER — Telehealth: Payer: Self-pay | Admitting: Endocrinology

## 2020-06-12 NOTE — Telephone Encounter (Signed)
Patient called asking if Dr could call in a Dexcom (including all supplies) to her pharmacy?  [id number] - [place], [place] - [address] AT [place] Phone:  [phone]  Fax:  [phone]

## 2020-06-12 NOTE — Telephone Encounter (Signed)
Please advise 

## 2020-06-13 ENCOUNTER — Ambulatory Visit: Payer: Self-pay | Admitting: Endocrinology

## 2020-06-13 NOTE — Telephone Encounter (Signed)
LMTCB and sent mychart

## 2020-06-13 NOTE — Telephone Encounter (Signed)
Dexcom is not going to be covered since she is not on insulin.  Also she needs to come in for her lab work and reschedule her office visit.  [place] office: please reschedule

## 2020-06-19 ENCOUNTER — Encounter (HOSPITAL_COMMUNITY): Payer: Self-pay

## 2020-06-19 ENCOUNTER — Telehealth: Payer: Self-pay | Admitting: Family Medicine

## 2020-06-19 ENCOUNTER — Emergency Department (HOSPITAL_COMMUNITY): Payer: BC Managed Care – PPO

## 2020-06-19 ENCOUNTER — Emergency Department (HOSPITAL_COMMUNITY)
Admission: EM | Admit: 2020-06-19 | Discharge: 2020-06-19 | Disposition: A | Discharge status: Home / Self Care | Payer: BC Managed Care – PPO | Attending: Emergency Medicine | Admitting: Emergency Medicine

## 2020-06-19 ENCOUNTER — Other Ambulatory Visit: Payer: Self-pay

## 2020-06-19 DIAGNOSIS — I10 Essential (primary) hypertension: Secondary | ICD-10-CM | POA: Insufficient documentation

## 2020-06-19 DIAGNOSIS — E119 Type 2 diabetes mellitus without complications: Secondary | ICD-10-CM | POA: Diagnosis not present

## 2020-06-19 DIAGNOSIS — Z7982 Long term (current) use of aspirin: Secondary | ICD-10-CM | POA: Insufficient documentation

## 2020-06-19 DIAGNOSIS — Z79899 Other long term (current) drug therapy: Secondary | ICD-10-CM | POA: Diagnosis not present

## 2020-06-19 DIAGNOSIS — Z20822 Contact with and (suspected) exposure to covid-19: Secondary | ICD-10-CM | POA: Diagnosis not present

## 2020-06-19 DIAGNOSIS — Z7984 Long term (current) use of oral hypoglycemic drugs: Secondary | ICD-10-CM | POA: Diagnosis not present

## 2020-06-19 DIAGNOSIS — R519 Headache, unspecified: Secondary | ICD-10-CM | POA: Diagnosis not present

## 2020-06-19 LAB — CBC WITH DIFFERENTIAL/PLATELET
Abs Immature Granulocytes: 0.03 10*3/uL (ref 0.00–0.07)
Basophils Absolute: 0 10*3/uL (ref 0.0–0.1)
Basophils Relative: 0 %
Eosinophils Absolute: 0.2 10*3/uL (ref 0.0–0.5)
Eosinophils Relative: 2 %
HCT: 44.7 % (ref 36.0–46.0)
Hemoglobin: 14.5 g/dL (ref 12.0–15.0)
Immature Granulocytes: 0 %
Lymphocytes Relative: 34 %
Lymphs Abs: 3.2 10*3/uL (ref 0.7–4.0)
MCH: 30.1 pg (ref 26.0–34.0)
MCHC: 32.4 g/dL (ref 30.0–36.0)
MCV: 92.9 fL (ref 80.0–100.0)
Monocytes Absolute: 0.4 10*3/uL (ref 0.1–1.0)
Monocytes Relative: 5 %
Neutro Abs: 5.5 10*3/uL (ref 1.7–7.7)
Neutrophils Relative %: 59 %
Platelets: 301 10*3/uL (ref 150–400)
RBC: 4.81 MIL/uL (ref 3.87–5.11)
RDW: 13.2 % (ref 11.5–15.5)
WBC: 9.3 10*3/uL (ref 4.0–10.5)
nRBC: 0 % (ref 0.0–0.2)

## 2020-06-19 LAB — BASIC METABOLIC PANEL
Anion gap: 12 (ref 5–15)
BUN: 15 mg/dL (ref 6–20)
CO2: 25 mmol/L (ref 22–32)
Calcium: 9.9 mg/dL (ref 8.9–10.3)
Chloride: 105 mmol/L (ref 98–111)
Creatinine, Ser: 0.98 mg/dL (ref 0.44–1.00)
GFR calc Af Amer: >60 mL/min (ref >60)
GFR calc non Af Amer: >60 mL/min (ref >60)
Glucose, Bld: 103 mg/dL — ABNORMAL HIGH (ref 70–99)
Potassium: 4 mmol/L (ref 3.5–5.1)
Sodium: 142 mmol/L (ref 135–145)

## 2020-06-19 LAB — SARS CORONAVIRUS 2 BY RT PCR (HOSPITAL ORDER, PERFORMED IN ~~LOC~~ HOSPITAL LAB): SARS Coronavirus 2: NEGATIVE

## 2020-06-19 MED ORDER — AMLODIPINE BESYLATE 5 MG PO TABS: 5.0000 mg | ORAL_TABLET | Freq: Every day | ORAL | 0 refills | Status: DC

## 2020-06-19 MED ORDER — AMLODIPINE BESYLATE 5 MG PO TABS
5.0000 mg | ORAL_TABLET | Freq: Once | ORAL | Status: AC
  Administered 2020-06-19: 5 mg via ORAL
  Filled 2020-06-19: qty 1

## 2020-06-19 MED ORDER — ACETAMINOPHEN 325 MG PO TABS
650.0000 mg | ORAL_TABLET | Freq: Once | ORAL | Status: AC
  Administered 2020-06-19: 650 mg via ORAL
  Filled 2020-06-19: qty 2

## 2020-06-19 MED ORDER — LABETALOL HCL 5 MG/ML IV SOLN
20.0000 mg | Freq: Once | INTRAVENOUS | Status: AC
  Administered 2020-06-19: 20 mg via INTRAVENOUS
  Filled 2020-06-19: qty 4

## 2020-06-19 MED ORDER — PROCHLORPERAZINE EDISYLATE 10 MG/2ML IJ SOLN
10.0000 mg | Freq: Once | INTRAMUSCULAR | Status: AC
  Administered 2020-06-19: 10 mg via INTRAVENOUS
  Filled 2020-06-19: qty 2

## 2020-06-19 NOTE — ED Triage Notes (Addendum)
Patient reports checking her BP at home and in 180's/100's.  C/O headache X4 days   Patient told by her PCP to come to ED.  Patient reports taking BP medication as directed.   A/Ox4 Ambulatory in triage.  Denies chest pain or shob.

## 2020-06-19 NOTE — Discharge Instructions (Addendum)
Start taking the norvasc blood pressure medication in addition to your other BP medication.  Follow up with your doctor as planned this week

## 2020-06-19 NOTE — Telephone Encounter (Signed)
Caller Luwanda Starr  Call Back # [phone]  Patient states blood pressure reading this morning is 185/110 after medication. Patient states she also has a headache.

## 2020-06-19 NOTE — Telephone Encounter (Signed)
Patient states she is going to ER.

## 2020-06-19 NOTE — ED Provider Notes (Signed)
[facility] DEPT Provider Note   CSN: [id number] Arrival date & time: 06/19/20  1254     History Chief Complaint  Patient presents with  . Hypertension  . Headache    Angel Dawson is a 39 y.o. female.  HPI   Pt presents to the ed for evaluation of headache and htn. Pt has history of htn but it is generally well controlled, in the 185U systolic on her one medications.  In the last few days she has been having a headache.  She feels a pressure in her head.  Today she started noted some flashes of light in her eyes.  She took her blood pressure this morning and it was elevated in the 180s.  She called her doctor about her symptoms and her blood pressure instructed to come to the ED.  Patient denies any focal numbness or weakness.  No trouble with her balance or speech.  She is not having any fevers or chills.  She does have some nasal congestion but it is mild.  Patient has been vaccinated for Covid.  Past Medical History:  Diagnosis Date  . Abdominal pain    related to gallstones  . Achilles tendonitis, bilateral   . Bronchitis    02/17  . Diabetes ([place])   . DKA (diabetic ketoacidoses) ([place]) 08/25/2014  . Gallstones   . GERD (gastroesophageal reflux disease)   . Headache   . Hypertension   . Lattice degeneration, both eyes   . Pancreatitis   . PCOS (polycystic ovarian syndrome)   . Sinus congestion    occ. uses OTC meds for this    Patient Active Problem List   Diagnosis Date Noted  . Obstructive sleep apnea treated with continuous positive airway pressure (CPAP) 12/29/2017  . Onychomycosis 10/26/2014  . Gastroesophageal reflux disease without esophagitis 09/28/2014  . Diabetes mellitus type II, uncontrolled ([place]) 09/28/2014  . Hypertension 08/25/2014  . Morbid obesity ([place]) 08/25/2014  . Chronic rhinitis 06/30/2014  . Pancreatitis, acute 07/02/2013  . Gallstones 04/13/2013    Past Surgical History:  Procedure Laterality Date  .  CHOLECYSTECTOMY N/A 06/22/2013   Procedure: LAPAROSCOPIC CHOLECYSTECTOMY ;  Surgeon: Adin Hector, MD;  Location: [facility];  Service: General;  Laterality: N/A;  . DILITATION & CURRETTAGE/HYSTROSCOPY WITH HYDROTHERMAL ABLATION N/A 01/03/2016   Procedure: DILATATION & CURETTAGE/HYSTEROSCOPY WITH HYDROTHERMAL ABLATION;  Surgeon: Crawford Givens, MD;  Location: [facility];  Service: Gynecology;  Laterality: N/A;  HTA rep will be attend confirmed by office 12/12/15   . TONSILLECTOMY       OB History   No obstetric history on file.     Family History  Problem Relation Age of Onset  . Hypertension Mother   . Diabetes Mother   . Goiter Mother   . Hypertension Father   . Diabetes Father   . Asthma Father   . Stroke Paternal Grandfather   . Lung cancer Paternal Grandmother        smoker-heavy  . Diabetes Maternal Aunt   . Colon cancer Neg Hx   . Heart disease Neg Hx     Social History   Tobacco Use  . Smoking status: Never Smoker  . Smokeless tobacco: Never Used  Substance Use Topics  . Alcohol use: Yes    Alcohol/week: 0.0 standard drinks    Comment: 1 drink per month  . Drug use: No    Home Medications Prior to Admission medications   Medication Sig Start Date End Date  Taking? Authorizing Provider  amLODipine (NORVASC) 5 MG tablet Take 1 tablet (5 mg total) by mouth daily. 06/19/20   Dorie Rank, MD  aspirin-acetaminophen-caffeine (EXCEDRIN MIGRAINE) [id number] MG tablet Take 2 tablets by mouth as needed for headache.    [provider]  Aspirin-Caffeine (BAYER BACK & BODY) 500-32.5 MG TABS Take 2 tablets by mouth as needed.    [provider]  Blood Glucose Monitoring Suppl (BLOOD GLUCOSE METER KIT AND SUPPLIES) Dispense based on patient and insurance preference. Use up to four times daily as directed. (FOR ICD-9 250.00, 250.01). 08/26/14   Annita Brod, MD  Blood Glucose Monitoring Suppl ([place]) w/Device KIT 1 each by Does not apply route  daily. Use as instructed to check blood sugar once daily.    [provider]  fluticasone (FLONASE) 50 MCG/ACT nasal spray USE 1 SPRAY IN EACH NOSTRIL EVERY DAY 10/15/17   [name], Gay Filler, MD  gabapentin (NEURONTIN) 300 MG capsule Take 1 capsule by mouth once daily at bedtime. **needs appt for further refills.** 12/12/19   Elayne Snare, MD  INVOKANA 300 MG TABS tablet TAKE 1 TABLET BY MOUTH DAILY( BEFORE BREAKFAST) Patient taking differently: Take 300 mg by mouth at bedtime. Take 1 tablet by mouth daily at bedtime. 01/09/20   Elayne Snare, MD  lisinopril (ZESTRIL) 10 MG tablet Take 1 tablet (10 mg total) by mouth daily. Use as needed for elevated BP 05/26/19   [name], Gay Filler, MD  lisinopril-hydrochlorothiazide (ZESTORETIC) 20-25 MG tablet Take 1 tablet by mouth daily. 12/15/19   [name], Gay Filler, MD  metFORMIN (GLUCOPHAGE-XR) 750 MG 24 hr tablet Take 1 tablet (750 mg total) by mouth daily with breakfast. Needs appt for further refills. Patient taking differently: Take 750 mg by mouth in the morning and at bedtime. Take 1 tablet by mouth twice daily. 12/12/19   Elayne Snare, MD  OneTouch Delica Lancets 59F MISC 1 each by Does not apply route daily. Use as instructed to check blood sugar once daily.    [provider]  OneTouch Delica Lancets 63W MISC Use once daily to check glucose. 12/29/18   Elayne Snare, MD  [facility] VERIO test strip USE TO TEST BLOOD SUGAR ONCE DAILY 08/04/19   Elayne Snare, MD  Probiotic Product (PROBIOTIC-10 PO) Take 1 capsule by mouth daily. Takes Fortify Women's Digestive Probiotic    [provider]    Allergies    Patient has no allergy information on record.  Review of Systems   Review of Systems  All other systems reviewed and are negative.   Physical Exam Updated Vital Signs BP (!) 189/124   Pulse 86   Temp 98.4 F (36.9 C) (Oral)   Resp 20   SpO2 100%   Physical Exam Vitals and nursing note reviewed.  Constitutional:      General:  She is not in acute distress.    Appearance: She is well-developed.  HENT:     Head: Normocephalic and atraumatic.     Right Ear: External ear normal.     Left Ear: External ear normal.  Eyes:     General: No scleral icterus.       Right eye: No discharge.        Left eye: No discharge.     Conjunctiva/sclera: Conjunctivae normal.  Neck:     Trachea: No tracheal deviation.  Cardiovascular:     Rate and Rhythm: Normal rate and regular rhythm.  Pulmonary:     Effort:  Pulmonary effort is normal. No respiratory distress.     Breath sounds: Normal breath sounds. No stridor. No wheezing or rales.  Abdominal:     General: Bowel sounds are normal. There is no distension.     Palpations: Abdomen is soft.     Tenderness: There is no abdominal tenderness. There is no guarding or rebound.  Musculoskeletal:        General: No tenderness.     Cervical back: Neck supple.  Skin:    General: Skin is warm and dry.     Findings: No rash.  Neurological:     Mental Status: She is alert.     Cranial Nerves: No cranial nerve deficit (no facial droop, extraocular movements intact, no slurred speech), dysarthria or facial asymmetry.     Sensory: No sensory deficit.     Motor: No weakness, abnormal muscle tone or seizure activity.     Coordination: Coordination normal.  Psychiatric:        Speech: Speech normal.        Behavior: Behavior normal.     ED Results / Procedures / Treatments   Labs (all labs ordered are listed, but only abnormal results are displayed) Labs Reviewed  BASIC METABOLIC PANEL - Abnormal; Notable for the following components:      Result Value   Glucose, Bld 103 (*)    All other components within normal limits  SARS CORONAVIRUS 2 BY RT PCR [facility] ORDER, [place] LAB)  CBC WITH DIFFERENTIAL/PLATELET    EKG EKG Interpretation  Date/Time:  Tuesday June 19 2020 18:29:12 EDT Ventricular Rate:  80 PR Interval:    QRS Duration: 91 QT  Interval:  368 QTC Calculation: 425 R Axis:   57 Text Interpretation: Sinus rhythm Prolonged PR interval Low voltage, precordial leads Borderline T abnormalities, diffuse leads Baseline wander in lead(s) II III aVL aVF V3 V4 V5 V6 No significant change since last tracing Confirmed by Dorie Rank [phone]) on 06/19/2020 6:53:40 PM   Radiology CT Head Wo Contrast  Result Date: 06/19/2020 CLINICAL DATA:  Headache EXAM: CT HEAD WITHOUT CONTRAST TECHNIQUE: Contiguous axial images were obtained from the base of the skull through the vertex without intravenous contrast. COMPARISON:  CT 09/30/2015 FINDINGS: Brain: No evidence of acute infarction, hemorrhage, hydrocephalus, extra-axial collection or mass lesion/mass effect. Vascular: No hyperdense vessel or unexpected calcification. Skull: Normal. Negative for fracture or focal lesion. Sinuses/Orbits: No acute finding. Other: None IMPRESSION: Negative non contrasted CT appearance of the brain. Electronically Signed   By: Donavan Foil M.D.   On: 06/19/2020 18:33    Procedures Procedures (including critical care time)  Medications Ordered in ED Medications  amLODipine (NORVASC) tablet 5 mg (has no administration in time range)  labetalol (NORMODYNE) injection 20 mg (20 mg Intravenous Given 06/19/20 1815)  prochlorperazine (COMPAZINE) injection 10 mg (10 mg Intravenous Given 06/19/20 1814)  acetaminophen (TYLENOL) tablet 650 mg (650 mg Oral Given 06/19/20 1813)    ED Course  I have reviewed the triage vital signs and the nursing notes.  Pertinent labs & imaging results that were available during my care of the patient were reviewed by me and considered in my medical decision making (see chart for details).  Clinical Course as of Jun 19 2038  Tue Jun 19, 2020  1853 Head CT without acute findings   [JK]  2034 Covid negative. Labs reassuring.  BP has decreased but still elevated   [JK]  2034 HA resolved after treatment   [  JK]    Clinical Course User  Index [JK] Dorie Rank, MD   MDM Rules/Calculators/A&P                          Patient presents ED for evaluation of headache associated with elevated blood pressure.  Patient's ED work-up is reassuring.  No signs of intracerebral hemorrhage.  Laboratory tests are unremarkable.  Patient was given a dose of blood pressure medications with some improvement in her blood pressure.  Headache improved after treatment.  Will add on dose of Norvasc to her blood pressure medication regimen.  Patient does have follow-up with her doctor later this week.  Patient also requested Covid testing and that was negative. Final Clinical Impression(s) / ED Diagnoses Final diagnoses:  Hypertension, unspecified type  Acute nonintractable headache, unspecified headache type    Rx / DC Orders ED Discharge Orders         Ordered    amLODipine (NORVASC) 5 MG tablet  Daily        06/19/20 2036           Dorie Rank, MD 06/19/20 2041

## 2020-06-20 NOTE — Patient Instructions (Signed)
It was good to see you again today, I will be in touch with your labs as soon as possible ?

## 2020-06-20 NOTE — Progress Notes (Signed)
[facility] [address], [place], [place] [address] [phone]  Date:  06/21/2020   Name:  Angel Dawson   DOB:  30-Jun-1981   MRN:  [id number]  PCP:  Darreld Mclean, MD    Chief Complaint: No chief complaint on file.   History of Present Illness:  Angel Dawson is a 39 y.o. very pleasant female patient who presents with the following:  Patient here today for hospital follow-up and concern of blood pressure- history of DM, OSA, HTN, obesity, gallstone pancreatitis s/p cholecystectomy  Virtual visit-patient is at home, provider is at office.  Patient is confirmed with 2 factors, the patient and myself are present on video call today  Last seen by myself about 1 year ago, no-show for visit in June She called in on September 7 with concern of high blood pressure and headache-  she was seen in the ER the same day for her sx Her highest BP was 216/129 in the ER She was given labetalol IV, blood pressure improved Her HA has been improved since her blood pressure came down The emergency room physician added amlodipine to her current regimen Amlodipine 5 Gabapentin?- she uses prn for neuropathy sx, takes once or twice a week Invokana Lisinopril/HCTZ 20/25 daily-also can take additional lisinopril 10 if needed Metformin XR 750 daily  Notes that her BP an hour ago was about 165/100 However, she is actually for combination HCTZ/lisinopril for few days.  She is currently taking lisinopril 20 mg plan that she had on hand  Her gynecologist is Dr. Charlesetta Garibaldi Her endocrinologist is Dr. Jacqulyn Ducking recent visit in May- next visit TBD  COVID-19 series- done  3/12. 4/2- phizer Pap-per GYN, she will be seen soon Eye exam- she is overdue, will schedule this  Foot exam Flu shot- she will do this fall Can update A1c, lipids, thyroid BMP, CMP, CBC up-to-date  Lab Results  Component Value Date   HGBA1C 6.5 02/17/2020     Patient Active Problem List   Diagnosis Date Noted  . Obstructive sleep apnea treated with continuous positive airway pressure (CPAP) 12/29/2017  . Onychomycosis 10/26/2014  . Gastroesophageal reflux disease without esophagitis 09/28/2014  . Diabetes mellitus type II, uncontrolled ([place]) 09/28/2014  . Hypertension 08/25/2014  . Morbid obesity ([place]) 08/25/2014  . Chronic rhinitis 06/30/2014  . Pancreatitis, acute 07/02/2013  . Gallstones 04/13/2013    Past Medical History:  Diagnosis Date  . Abdominal pain    related to gallstones  . Achilles tendonitis, bilateral   . Bronchitis    02/17  . Diabetes ([place])   . DKA (diabetic ketoacidoses) ([place]) 08/25/2014  . Gallstones   . GERD (gastroesophageal reflux disease)   . Headache   . Hypertension   . Lattice degeneration, both eyes   . Pancreatitis   . PCOS (polycystic ovarian syndrome)   . Sinus congestion    occ. uses OTC meds for this    Past Surgical History:  Procedure Laterality Date  . CHOLECYSTECTOMY N/A 06/22/2013   Procedure: LAPAROSCOPIC CHOLECYSTECTOMY ;  Surgeon: Adin Hector, MD;  Location: [facility];  Service: General;  Laterality: N/A;  . DILITATION & CURRETTAGE/HYSTROSCOPY WITH HYDROTHERMAL ABLATION N/A 01/03/2016   Procedure: DILATATION & CURETTAGE/HYSTEROSCOPY WITH HYDROTHERMAL ABLATION;  Surgeon: Crawford Givens, MD;  Location: [facility];  Service: Gynecology;  Laterality: N/A;  HTA rep will be attend confirmed by office 12/12/15   . TONSILLECTOMY  Social History   Tobacco Use  . Smoking status: Never Smoker  . Smokeless tobacco: Never Used  Substance Use Topics  . Alcohol use: Yes    Alcohol/week: 0.0 standard drinks    Comment: 1 drink per month  . Drug use: No    Family History  Problem Relation Age of Onset  . Hypertension Mother   . Diabetes Mother   . Goiter Mother   . Hypertension Father   . Diabetes Father   . Asthma Father   . Stroke Paternal Grandfather   . Lung cancer Paternal  Grandmother        smoker-heavy  . Diabetes Maternal Aunt   . Colon cancer Neg Hx   . Heart disease Neg Hx     Not on File  Medication list has been reviewed and updated.  Current Outpatient Medications on File Prior to Visit  Medication Sig Dispense Refill  . aspirin-acetaminophen-caffeine (EXCEDRIN MIGRAINE) 250-250-65 MG tablet Take 2 tablets by mouth as needed for headache.    . Aspirin-Caffeine (BAYER BACK & BODY) 500-32.5 MG TABS Take 2 tablets by mouth as needed.    . Blood Glucose Monitoring Suppl (BLOOD GLUCOSE METER KIT AND SUPPLIES) Dispense based on patient and insurance preference. Use up to four times daily as directed. (FOR ICD-9 250.00, 250.01). 1 each 0  . Blood Glucose Monitoring Suppl ([place]) w/Device KIT 1 each by Does not apply route daily. Use as instructed to check blood sugar once daily.    Marland Kitchen gabapentin (NEURONTIN) 300 MG capsule Take 1 capsule by mouth once daily at bedtime. **needs appt for further refills.** 90 capsule 0  . INVOKANA 300 MG TABS tablet TAKE 1 TABLET BY MOUTH DAILY( BEFORE BREAKFAST) (Patient taking differently: Take 300 mg by mouth at bedtime. Take 1 tablet by mouth daily at bedtime.) 30 tablet 3  . lisinopril (ZESTRIL) 10 MG tablet Take 1 tablet (10 mg total) by mouth daily. Use as needed for elevated BP 30 tablet 3  . metFORMIN (GLUCOPHAGE-XR) 750 MG 24 hr tablet Take 1 tablet (750 mg total) by mouth daily with breakfast. Needs appt for further refills. (Patient taking differently: Take 750 mg by mouth in the morning and at bedtime. Take 1 tablet by mouth twice daily.) 90 tablet 0  . OneTouch Delica Lancets 67E MISC 1 each by Does not apply route daily. Use as instructed to check blood sugar once daily.    Glory Rosebush Delica Lancets 93Y MISC Use once daily to check glucose. 100 each 1  . ONETOUCH VERIO test strip USE TO TEST BLOOD SUGAR ONCE DAILY 100 strip 2  . Probiotic Product (PROBIOTIC-10 PO) Take 1 capsule by mouth daily.  Takes Fortify Women's Digestive Probiotic     No current facility-administered medications on file prior to visit.    Review of Systems:  As per HPI- otherwise negative.   Physical Examination: There were no vitals filed for this visit. There were no vitals filed for this visit. There is no height or weight on file to calculate BMI. Ideal Body Weight:    Pt observed via video-she looks well, her normal self.  No distress, obese.  No cough or wheezing noted She is checking her BP but not other vital signs   Assessment and Plan: Hospital discharge follow-up  Controlled type 2 diabetes mellitus with other specified complication, without long-term current use of insulin (HCC)  Essential hypertension, benign - Plan: lisinopril-hydrochlorothiazide (ZESTORETIC) 20-25 MG tablet, amLODipine (NORVASC) 5  MG tablet  Obstructive sleep apnea treated with continuous positive airway pressure (CPAP)  Screening for thyroid disorder  Allergic rhinitis due to other allergic trigger, unspecified seasonality - Plan: fluticasone (FLONASE) 50 MCG/ACT nasal spray  Virtual visit today to discuss recent hospital visit and blood pressure concerns Patient had run out of her combination lisinopril/HCTZ, this may be why her blood pressure went high.  She is currently taking lisinopril 20 as well as amlodipine 5.  I will refill her combination lisinopril/HCTZ medication, continue amlodipine for now.  I have asked her to check her blood pressure a few times over the next week and send a message with updated readings, she agrees She will let me know if her blood pressures are getting her or she has any other concerns in the meantime Recommended that she have a thyroid and lipid panel done at next labs Video used for greater than 50% of visit today  This visit occurred during the SARS-CoV-2 public health emergency.  Safety protocols were in place, including screening questions prior to the visit, additional  usage of staff PPE, and extensive cleaning of exam room while observing appropriate contact time as indicated for disinfecting solutions.    Signed Lamar Blinks, MD

## 2020-06-21 ENCOUNTER — Telehealth (INDEPENDENT_AMBULATORY_CARE_PROVIDER_SITE_OTHER): Payer: BC Managed Care – PPO | Admitting: Family Medicine

## 2020-06-21 DIAGNOSIS — E1169 Type 2 diabetes mellitus with other specified complication: Secondary | ICD-10-CM | POA: Diagnosis not present

## 2020-06-21 DIAGNOSIS — Z09 Encounter for follow-up examination after completed treatment for conditions other than malignant neoplasm: Secondary | ICD-10-CM

## 2020-06-21 DIAGNOSIS — I10 Essential (primary) hypertension: Secondary | ICD-10-CM | POA: Diagnosis not present

## 2020-06-21 DIAGNOSIS — G4733 Obstructive sleep apnea (adult) (pediatric): Secondary | ICD-10-CM | POA: Diagnosis not present

## 2020-06-21 DIAGNOSIS — Z9989 Dependence on other enabling machines and devices: Secondary | ICD-10-CM

## 2020-06-21 DIAGNOSIS — Z1329 Encounter for screening for other suspected endocrine disorder: Secondary | ICD-10-CM

## 2020-06-21 DIAGNOSIS — J3089 Other allergic rhinitis: Secondary | ICD-10-CM

## 2020-06-21 MED ORDER — AMLODIPINE BESYLATE 5 MG PO TABS: 5.0000 mg | ORAL_TABLET | Freq: Every day | ORAL | 3 refills | Status: DC

## 2020-06-21 MED ORDER — LISINOPRIL-HYDROCHLOROTHIAZIDE 20-25 MG PO TABS: 1.0000 | ORAL_TABLET | Freq: Every day | ORAL | 3 refills | Status: DC

## 2020-06-21 MED ORDER — FLUTICASONE PROPIONATE 50 MCG/ACT NA SUSP: NASAL | 11 refills | Status: DC

## 2020-06-24 ENCOUNTER — Encounter: Payer: Self-pay | Admitting: Family Medicine

## 2020-08-22 ENCOUNTER — Other Ambulatory Visit: Payer: Self-pay | Admitting: Endocrinology

## 2020-10-18 ENCOUNTER — Telehealth: Payer: Self-pay | Admitting: Endocrinology

## 2020-10-18 NOTE — Telephone Encounter (Signed)
-----   Message from Reather Littler, MD sent at 10/15/2020  9:59 AM EST ----- Regarding: Follow-up Patient overdue for appointment.  Please call to schedule with labs before appointment

## 2020-10-18 NOTE — Telephone Encounter (Signed)
LMTCB to schedule follow up and prior labs 

## 2020-10-29 ENCOUNTER — Ambulatory Visit: Payer: BLUE CROSS/BLUE SHIELD | Admitting: Podiatry

## 2020-10-30 ENCOUNTER — Other Ambulatory Visit: Payer: Self-pay | Admitting: *Deleted

## 2020-10-30 ENCOUNTER — Other Ambulatory Visit: Payer: Self-pay

## 2020-10-30 DIAGNOSIS — I10 Essential (primary) hypertension: Secondary | ICD-10-CM

## 2020-10-30 DIAGNOSIS — J3089 Other allergic rhinitis: Secondary | ICD-10-CM

## 2020-10-30 MED ORDER — METFORMIN HCL ER 750 MG PO TB24
750.0000 mg | ORAL_TABLET | Freq: Every day | ORAL | 0 refills | Status: DC
Start: 2020-10-30 — End: 2020-11-14

## 2020-10-30 MED ORDER — AMLODIPINE BESYLATE 5 MG PO TABS: 5.0000 mg | ORAL_TABLET | Freq: Every day | ORAL | 1 refills | Status: DC

## 2020-10-30 MED ORDER — FLUTICASONE PROPIONATE 50 MCG/ACT NA SUSP: NASAL | 8 refills | Status: DC

## 2020-10-30 MED ORDER — LISINOPRIL 10 MG PO TABS: 10.0000 mg | ORAL_TABLET | Freq: Every day | ORAL | 1 refills | Status: DC

## 2020-11-01 DIAGNOSIS — H33323 Round hole, bilateral: Secondary | ICD-10-CM | POA: Diagnosis not present

## 2020-11-01 DIAGNOSIS — H35413 Lattice degeneration of retina, bilateral: Secondary | ICD-10-CM | POA: Diagnosis not present

## 2020-11-01 DIAGNOSIS — H04123 Dry eye syndrome of bilateral lacrimal glands: Secondary | ICD-10-CM | POA: Diagnosis not present

## 2020-11-01 DIAGNOSIS — E119 Type 2 diabetes mellitus without complications: Secondary | ICD-10-CM | POA: Diagnosis not present

## 2020-11-01 LAB — HM DIABETES EYE EXAM

## 2020-11-07 ENCOUNTER — Other Ambulatory Visit: Payer: Self-pay

## 2020-11-07 ENCOUNTER — Other Ambulatory Visit (INDEPENDENT_AMBULATORY_CARE_PROVIDER_SITE_OTHER): Payer: BC Managed Care – PPO

## 2020-11-07 DIAGNOSIS — E669 Obesity, unspecified: Secondary | ICD-10-CM | POA: Diagnosis not present

## 2020-11-07 DIAGNOSIS — E1169 Type 2 diabetes mellitus with other specified complication: Secondary | ICD-10-CM | POA: Diagnosis not present

## 2020-11-07 LAB — COMPREHENSIVE METABOLIC PANEL
ALT: 16 U/L (ref 0–35)
AST: 15 U/L (ref 0–37)
Albumin: 4.3 g/dL (ref 3.5–5.2)
Alkaline Phosphatase: 77 U/L (ref 39–117)
BUN: 15 mg/dL (ref 6–23)
CO2: 27 mEq/L (ref 19–32)
Calcium: 10.3 mg/dL (ref 8.4–10.5)
Chloride: 103 mEq/L (ref 96–112)
Creatinine, Ser: 0.89 mg/dL (ref 0.40–1.20)
GFR: 80.54 mL/min (ref >60.00)
Glucose, Bld: 174 mg/dL — ABNORMAL HIGH (ref 70–99)
Potassium: 4.1 mEq/L (ref 3.5–5.1)
Sodium: 136 mEq/L (ref 135–145)
Total Bilirubin: 0.4 mg/dL (ref 0.2–1.2)
Total Protein: 7.3 g/dL (ref 6.0–8.3)

## 2020-11-07 LAB — LIPID PANEL
Cholesterol: 173 mg/dL (ref 0–200)
HDL: 39.2 mg/dL (ref >39.00)
LDL Cholesterol: 98 mg/dL (ref 0–99)
NonHDL: 133.78
Total CHOL/HDL Ratio: 4
Triglycerides: 179 mg/dL — ABNORMAL HIGH (ref 0.0–149.0)
VLDL: 35.8 mg/dL (ref 0.0–40.0)

## 2020-11-07 LAB — HEMOGLOBIN A1C: Hgb A1c MFr Bld: 6.8 % — ABNORMAL HIGH (ref 4.6–6.5)

## 2020-11-12 ENCOUNTER — Encounter: Payer: BC Managed Care – PPO | Admitting: Family Medicine

## 2020-11-13 DIAGNOSIS — G4733 Obstructive sleep apnea (adult) (pediatric): Secondary | ICD-10-CM | POA: Diagnosis not present

## 2020-11-13 DIAGNOSIS — J342 Deviated nasal septum: Secondary | ICD-10-CM | POA: Diagnosis not present

## 2020-11-13 DIAGNOSIS — J343 Hypertrophy of nasal turbinates: Secondary | ICD-10-CM | POA: Diagnosis not present

## 2020-11-14 ENCOUNTER — Other Ambulatory Visit: Payer: Self-pay

## 2020-11-14 ENCOUNTER — Other Ambulatory Visit: Payer: Self-pay | Admitting: Otolaryngology

## 2020-11-14 ENCOUNTER — Ambulatory Visit: Payer: BC Managed Care – PPO | Admitting: Endocrinology

## 2020-11-14 ENCOUNTER — Encounter: Payer: Self-pay | Admitting: Endocrinology

## 2020-11-14 VITALS — BP 128/84 | HR 91 | Ht 70.0 in | Wt >= 6400 oz

## 2020-11-14 DIAGNOSIS — I10 Essential (primary) hypertension: Secondary | ICD-10-CM

## 2020-11-14 DIAGNOSIS — E1165 Type 2 diabetes mellitus with hyperglycemia: Secondary | ICD-10-CM

## 2020-11-14 MED ORDER — XIGDUO XR 5-1000 MG PO TB24: 2.0000 | ORAL_TABLET | Freq: Every day | ORAL | 1 refills | Status: DC

## 2020-11-14 NOTE — Patient Instructions (Signed)
Check blood sugars on waking up 2-3 days a week  Also check blood sugars about 2 hours after meals and do this after different meals by rotation  Recommended blood sugar levels on waking up are 90-130 and about 2 hours after meal is 130-160  Please bring your blood sugar monitor to each visit, thank you  Xigduo 2 pills at dinner

## 2020-11-14 NOTE — Progress Notes (Signed)
Patient ID: BATYA CITRON, female   DOB: 13-Apr-1981, 40 y.o.   MRN: [id number]          Reason for Appointment: Follow-up Type 2 Diabetes  Referring physician: Lenah [name]   I connected with the above-named patient by video enabled telemedicine application and verified that I am speaking with the correct person. The patient was explained the limitations of evaluation and management by telemedicine and the availability of in person appointments.  Patient also understood that there may be a patient responsible charge related to this service . Location of the patient: Patient's home . Location of the provider: Physician office Only the patient and myself were participating in the encounter The patient understood the above statements and agreed to proceed.  History of Present Illness:          Date of diagnosis of type 2 diabetes mellitus:  2015      Background history:   At the time of diagnosis and 2015 she had extreme thirst and urination and was admitted to the hospital with blood sugars over 400 but no overt acidosis However she was discharged only on metformin 500 mg twice a day which she has continued Prior to that in 2014 she appears to have had mild hyperglycemia with blood sugars up to 195  Recent history:    Non-insulin hypoglycemic drugs the patient is taking are: Metformin Er 750 mg twice a day, Invokana 300 mg daily  Her A1c was up to 8.3 in 10/2018  A1c is now 6.8 compared to 6.5  As before she is very irregular with her follow-up and has not been seen since 02/2020  Current management, blood sugar patterns and problems identified:  She has not been checking her blood sugars much as before and has only 1 reading before this week  She ran out of her medications about a week or so ago and her blood sugars are over 200 now  Although in November she was going to the gym and exercising she has not done any recently  She thinks she has gained a little  weight  She is trying to avoid fast food but not always planning her meals as previously discussed  She said that she was having some difficulties with diarrhea and until recently has mostly taken only 1 Metformin daily  Side effects from medications have been: Diarrhea from high dose metformin  Glucose monitoring:  done less than 1 times a day         Glucometer:  One Touch   Home blood sugar readings as above      Self-care: The diet that the patient has been following is: none Breakfast grits, bacon, sometimes omelette              Dietician visit, most recent: 05/2017, previously in classes                Weight history:  Wt Readings from Last 3 Encounters:  11/14/20 (!) 414 lb 3.2 oz (187.9 kg)  05/26/19 (!) 403 lb (182.8 kg)  12/29/18 (!) 409 lb 12.8 oz (185.9 kg)    Glycemic control:    Lab Results  Component Value Date   HGBA1C 6.8 (H) 11/07/2020   HGBA1C 6.5 02/17/2020   HGBA1C 5.9 05/16/2019   Lab Results  Component Value Date   MICROALBUR 1.5 02/17/2020   [place] 98 11/07/2020   CREATININE 0.89 11/07/2020   Lab Results  Component Value Date   MICRALBCREAT 2.3 02/17/2020  Lab Results  Component Value Date   FRUCTOSAMINE 269 12/27/2018   FRUCTOSAMINE 258 06/12/2017   No visits with results within 1 Week(s) from this visit.  Latest known visit with results is:  Lab on 11/07/2020  Component Date Value Ref Range Status  . Cholesterol 11/07/2020 173  0 - 200 mg/dL Final   ATP III Classification       Desirable:  < 200 mg/dL               Borderline High:  200 - 239 mg/dL          High:  > = 240 mg/dL  . Triglycerides 11/07/2020 179.0* 0.0 - 149.0 mg/dL Final   Normal:  <150 mg/dLBorderline High:  150 - 199 mg/dL  . HDL 11/07/2020 39.20  >39.00 mg/dL Final  . VLDL 11/07/2020 35.8  0.0 - 40.0 mg/dL Final  . LDL Cholesterol 11/07/2020 98  0 - 99 mg/dL Final  . Total CHOL/HDL Ratio 11/07/2020 4   Final                  Men          Women1/2 Average  Risk     3.4          3.3Average Risk          5.0          4.42X Average Risk          9.6          7.13X Average Risk          15.0          11.0                      . NonHDL 11/07/2020 133.78   Final   NOTE:  Non-HDL goal should be 30 mg/dL higher than patient's LDL goal (i.e. LDL goal of < 70 mg/dL, would have non-HDL goal of < 100 mg/dL)  . Sodium 11/07/2020 136  135 - 145 mEq/L Final  . Potassium 11/07/2020 4.1  3.5 - 5.1 mEq/L Final  . Chloride 11/07/2020 103  96 - 112 mEq/L Final  . CO2 11/07/2020 27  19 - 32 mEq/L Final  . Glucose, Bld 11/07/2020 174* 70 - 99 mg/dL Final  . BUN 11/07/2020 15  6 - 23 mg/dL Final  . Creatinine, Ser 11/07/2020 0.89  0.40 - 1.20 mg/dL Final  . Total Bilirubin 11/07/2020 0.4  0.2 - 1.2 mg/dL Final  . Alkaline Phosphatase 11/07/2020 77  39 - 117 U/L Final  . AST 11/07/2020 15  0 - 37 U/L Final  . ALT 11/07/2020 16  0 - 35 U/L Final  . Total Protein 11/07/2020 7.3  6.0 - 8.3 g/dL Final  . Albumin 11/07/2020 4.3  3.5 - 5.2 g/dL Final  . GFR 11/07/2020 80.54  >60.00 mL/min Final   Calculated using the CKD-EPI Creatinine Equation (2021)  . Calcium 11/07/2020 10.3  8.4 - 10.5 mg/dL Final  . Hgb A1c MFr Bld 11/07/2020 6.8* 4.6 - 6.5 % Final   Glycemic Control Guidelines for People with Diabetes:Non Diabetic:  <6%Goal of Therapy: <7%Additional Action Suggested:  >8%       Allergies as of 11/14/2020   No Known Allergies     Medication List       Accurate as of November 14, 2020  9:45 AM. If you have any questions, ask your nurse or doctor.  amLODipine 5 MG tablet Commonly known as: NORVASC Take 1 tablet (5 mg total) by mouth daily.   aspirin-acetaminophen-caffeine 250-250-65 MG tablet Commonly known as: EXCEDRIN MIGRAINE Take 2 tablets by mouth as needed for headache.   Aspirin-Caffeine 500-32.5 MG Tabs Take 2 tablets by mouth as needed.   OneTouch Verio Flex System w/Device Kit 1 each by Does not apply route daily. Use as instructed to  check blood sugar once daily.   blood glucose meter kit and supplies Dispense based on patient and insurance preference. Use up to four times daily as directed. (FOR ICD-9 250.00, 250.01).   fluticasone 50 MCG/ACT nasal spray Commonly known as: FLONASE USE 1 SPRAY IN EACH NOSTRIL EVERY DAY   gabapentin 300 MG capsule Commonly known as: NEURONTIN Take 1 capsule by mouth once daily at bedtime. **needs appt for further refills.**   Invokana 300 MG Tabs tablet Generic drug: canagliflozin TAKE 1 TABLET BY MOUTH DAILY( BEFORE BREAKFAST)   lisinopril 10 MG tablet Commonly known as: ZESTRIL Take 1 tablet (10 mg total) by mouth daily. Use as needed for elevated BP   lisinopril-hydrochlorothiazide 20-25 MG tablet Commonly known as: ZESTORETIC Take 1 tablet by mouth daily.   metFORMIN 750 MG 24 hr tablet Commonly known as: GLUCOPHAGE-XR Take 1 tablet (750 mg total) by mouth daily with breakfast. Needs appt for further refills.   OneTouch Delica Lancets 78G Misc 1 each by Does not apply route daily. Use as instructed to check blood sugar once daily.   OneTouch Delica Lancets 95A Misc Use once daily to check glucose.   OneTouch Verio test strip Generic drug: glucose blood USE TO TEST BLOOD SUGAR ONCE DAILY   PROBIOTIC-10 PO Take 1 capsule by mouth daily. Takes Fortify Women's Digestive Probiotic       Allergies: No Known Allergies  Past Medical History:  Diagnosis Date  . Abdominal pain    related to gallstones  . Achilles tendonitis, bilateral   . Bronchitis    02/17  . Diabetes ([place])   . DKA (diabetic ketoacidoses) 08/25/2014  . Gallstones   . GERD (gastroesophageal reflux disease)   . Headache   . Hypertension   . Lattice degeneration, both eyes   . Pancreatitis   . PCOS (polycystic ovarian syndrome)   . Sinus congestion    occ. uses OTC meds for this    Past Surgical History:  Procedure Laterality Date  . CHOLECYSTECTOMY N/A 06/22/2013   Procedure:  LAPAROSCOPIC CHOLECYSTECTOMY ;  Surgeon: Adin Hector, MD;  Location: [facility];  Service: General;  Laterality: N/A;  . DILITATION & CURRETTAGE/HYSTROSCOPY WITH HYDROTHERMAL ABLATION N/A 01/03/2016   Procedure: DILATATION & CURETTAGE/HYSTEROSCOPY WITH HYDROTHERMAL ABLATION;  Surgeon: Crawford Givens, MD;  Location: [facility];  Service: Gynecology;  Laterality: N/A;  HTA rep will be attend confirmed by office 12/12/15   . TONSILLECTOMY      Family History  Problem Relation Age of Onset  . Hypertension Mother   . Diabetes Mother   . Goiter Mother   . Hypertension Father   . Diabetes Father   . Asthma Father   . Stroke Paternal Grandfather   . Lung cancer Paternal Grandmother        smoker-heavy  . Diabetes Maternal Aunt   . Colon cancer Neg Hx   . Heart disease Neg Hx     Social History:  reports that she has never smoked. She has never used smokeless tobacco. She reports current alcohol use. She reports that she does  not use drugs.   Review of Systems   Lipid history: Has not been on any statin drugs  with variable results LDL is below 100 recently    Lab Results  Component Value Date   CHOL 173 11/07/2020   CHOL 184 03/11/2019   CHOL 156 10/15/2017   Lab Results  Component Value Date   HDL 39.20 11/07/2020   HDL 41.70 03/11/2019   HDL 38.00 (L) 10/15/2017   Lab Results  Component Value Date   LDLCALC 98 11/07/2020   LDLCALC 106 (H) 03/11/2019   LDLCALC 90 10/15/2017   Lab Results  Component Value Date   TRIG 179.0 (H) 11/07/2020   TRIG 183.0 (H) 03/11/2019   TRIG 143.0 10/15/2017   Lab Results  Component Value Date   CHOLHDL 4 11/07/2020   CHOLHDL 4 03/11/2019   CHOLHDL 4 10/15/2017   Lab Results  Component Value Date   LDLDIRECT 87.0 05/16/2019   LDLDIRECT 87.5 09/22/2014            Hypertension: Present for the last 10 years, monitored by PCP  This is followed by her PCP but has not been seen for office visit recently She is taking both  amlodipine and lisinopril HCTZ; amlodipine started after a visit to the ER     BP Readings from Last 3 Encounters:  11/14/20 128/84  06/19/20 (!) 176/92  05/26/19 128/90    Most recent eye exam was 6/18, previously followed by a retina specialist but no diabetic retinopathy present  Has history of paresthesiae in her feet treated with gabapentin  Most recent foot exam: 2/22   LABS:  No visits with results within 1 Week(s) from this visit.  Latest known visit with results is:  Lab on 11/07/2020  Component Date Value Ref Range Status  . Cholesterol 11/07/2020 173  0 - 200 mg/dL Final   ATP III Classification       Desirable:  < 200 mg/dL               Borderline High:  200 - 239 mg/dL          High:  > = 240 mg/dL  . Triglycerides 11/07/2020 179.0* 0.0 - 149.0 mg/dL Final   Normal:  <150 mg/dLBorderline High:  150 - 199 mg/dL  . HDL 11/07/2020 39.20  >39.00 mg/dL Final  . VLDL 11/07/2020 35.8  0.0 - 40.0 mg/dL Final  . LDL Cholesterol 11/07/2020 98  0 - 99 mg/dL Final  . Total CHOL/HDL Ratio 11/07/2020 4   Final                  Men          Women1/2 Average Risk     3.4          3.3Average Risk          5.0          4.42X Average Risk          9.6          7.13X Average Risk          15.0          11.0                      . NonHDL 11/07/2020 133.78   Final   NOTE:  Non-HDL goal should be 30 mg/dL higher than patient's LDL goal (i.e. LDL goal of < 70 mg/dL, would have non-HDL goal  of < 100 mg/dL)  . Sodium 11/07/2020 136  135 - 145 mEq/L Final  . Potassium 11/07/2020 4.1  3.5 - 5.1 mEq/L Final  . Chloride 11/07/2020 103  96 - 112 mEq/L Final  . CO2 11/07/2020 27  19 - 32 mEq/L Final  . Glucose, Bld 11/07/2020 174* 70 - 99 mg/dL Final  . BUN 11/07/2020 15  6 - 23 mg/dL Final  . Creatinine, Ser 11/07/2020 0.89  0.40 - 1.20 mg/dL Final  . Total Bilirubin 11/07/2020 0.4  0.2 - 1.2 mg/dL Final  . Alkaline Phosphatase 11/07/2020 77  39 - 117 U/L Final  . AST 11/07/2020 15  0 - 37  U/L Final  . ALT 11/07/2020 16  0 - 35 U/L Final  . Total Protein 11/07/2020 7.3  6.0 - 8.3 g/dL Final  . Albumin 11/07/2020 4.3  3.5 - 5.2 g/dL Final  . GFR 11/07/2020 80.54  >60.00 mL/min Final   Calculated using the CKD-EPI Creatinine Equation (2021)  . Calcium 11/07/2020 10.3  8.4 - 10.5 mg/dL Final  . Hgb A1c MFr Bld 11/07/2020 6.8* 4.6 - 6.5 % Final   Glycemic Control Guidelines for People with Diabetes:Non Diabetic:  <6%Goal of Therapy: <7%Additional Action Suggested:  >8%     Physical Examination:  BP 128/84   Pulse 91   Ht 5' 10"  (1.778 m)   Wt (!) 414 lb 3.2 oz (187.9 kg)   SpO2 97%   BMI 59.43 kg/m   Diabetic Foot Exam - Simple   Simple Foot Form Diabetic Foot exam was performed with the following findings: Yes   Visual Inspection No deformities, no ulcerations, no other skin breakdown bilaterally: Yes Sensation Testing Intact to touch and monofilament testing bilaterally: Yes Pulse Check Posterior Tibialis and Dorsalis pulse intact bilaterally: Yes Comments         ASSESSMENT:  Diabetes type 2, with Morbid obesity   See history of present illness for detailed discussion of current diabetes management, blood sugar patterns and problems identified  Her A1c is now 6.8, previously 6.5  Has been prescribed Invokana 300 mg and metformin ER 1500 mg daily  She generally has fairly good control when she is able to take her medications consistently Blood sugars may be higher recently because of not taking Metformin twice a day because of issues with diarrhea which may be unrelated Also recently blood sugars are higher from running out of Invokana Invokana apparently not being covered now by her insurance  Her regimen of diet and exercise is also been irregular Not motivated to exercise recently Discussed her meal planning and she can do much better with planning her meals well, avoiding fast food, higher fat intake and eating balanced meals Also likely can do  better with postprandial control if she takes her Invokana before dinner instead of bedtime  Urine microalbumin normal  Normal foot exam today  LIPIDS: LDL below 100 but may consider statin drugs as cardiovascular risk reduction role  Hypertension: Does appear better controlled with adding amlodipine but needs to follow-up with her PCP   PLAN:   Since she likely does not have diarrhea from Metformin ER 1500 mg daily and will need to switch to [place] we will try Merleen Nicely Likely has less tendency to diarrhea with taking brand name Glucophage and her Merleen Nicely She will take both tablets at dinnertime since she does not always eat breakfast If not able to tolerate full dose of Metformin may consider adding a GLP-1 drug for weight loss  benefits also  Encouraged her to start exercise regimen at least 3 days a week She will see the dietitian for meal planning and needs significant weight loss  She does need more regular follow-up  Hypertension: She will continue same medications until reviewed by PCP  There are no Patient Instructions on file for this visit.       Elayne Snare 11/14/2020, 9:45 AM   Note: This office note was prepared with Dragon voice recognition system technology. Any transcriptional errors that result from this process are unintentional.

## 2020-11-26 ENCOUNTER — Encounter: Payer: Self-pay | Admitting: Endocrinology

## 2020-11-29 DIAGNOSIS — Z01419 Encounter for gynecological examination (general) (routine) without abnormal findings: Secondary | ICD-10-CM | POA: Diagnosis not present

## 2020-11-29 DIAGNOSIS — Z6841 Body Mass Index (BMI) 40.0 and over, adult: Secondary | ICD-10-CM | POA: Diagnosis not present

## 2020-11-29 DIAGNOSIS — R339 Retention of urine, unspecified: Secondary | ICD-10-CM | POA: Diagnosis not present

## 2020-11-29 DIAGNOSIS — Z1231 Encounter for screening mammogram for malignant neoplasm of breast: Secondary | ICD-10-CM | POA: Diagnosis not present

## 2020-12-23 ENCOUNTER — Encounter: Payer: Self-pay | Admitting: Family

## 2020-12-23 ENCOUNTER — Telehealth: Payer: BC Managed Care – PPO | Admitting: Family

## 2020-12-23 DIAGNOSIS — E1165 Type 2 diabetes mellitus with hyperglycemia: Secondary | ICD-10-CM | POA: Diagnosis not present

## 2020-12-23 DIAGNOSIS — Z8719 Personal history of other diseases of the digestive system: Secondary | ICD-10-CM | POA: Diagnosis not present

## 2020-12-23 DIAGNOSIS — R1012 Left upper quadrant pain: Secondary | ICD-10-CM | POA: Diagnosis not present

## 2020-12-23 NOTE — Progress Notes (Signed)
   Virtual Visit via telephone Note Due to COVID-19 pandemic this visit was conducted virtually. This visit type was conducted due to national recommendations for restrictions regarding the COVID-19 Pandemic (e.g. social distancing, sheltering in place) in an effort to limit this patient's exposure and mitigate transmission in our community. All issues noted in this document were discussed and addressed.  A physical exam was not performed with this format.  I connected with Angel Dawson on 12/23/20 at 5:10 pm  by video and verified that I am speaking with the correct person using two identifiers. Angel Dawson is currently located at home and no one  is currently with her during visit. The provider, Evelina Dun, FNP is located in their office at time of visit.  I discussed the limitations, risks, security and privacy concerns of performing an evaluation and management service by telephone and the availability of in person appointments. I also discussed with the patient that there may be a patient responsible charge related to this service. The patient expressed understanding and agreed to proceed.   History and Present Illness:  Pt presents to today with left upper abdominal pain that started yesterday. It has improved today. She has been on a liquid diet. She skipped her metformin because of diarrhea yesterday and her blood sugars were 220's today.  Abdominal Pain This is a new problem. The current episode started yesterday. The onset quality is gradual. The problem occurs constantly. The problem has been gradually improving. The pain is located in the LUQ. The pain is at a severity of 4/10. The pain is mild. The abdominal pain radiates to the back. Associated symptoms include nausea and vomiting. Pertinent negatives include no belching, constipation, diarrhea, dysuria, frequency or headaches. The pain is aggravated by movement. The pain is relieved by being still. Treatments tried: full  liquid diet. The treatment provided mild relief.     Review of Systems  Gastrointestinal: Positive for abdominal pain, nausea and vomiting. Negative for constipation and diarrhea.  Genitourinary: Negative for dysuria and frequency.  Neurological: Negative for headaches.  All other systems reviewed and are negative.    Observations/Objective: No SOB , pt looks well no acute distress noted.   Assessment and Plan: 1. Left upper quadrant abdominal pain  2. H/O acute pancreatitis  3. Uncontrolled type 2 diabetes mellitus with hyperglycemia (HCC)  Continue to force fluids Encouraged to take metformin with small amount of bland diet Make follow up appt with PCP tomorrow, but if abdominal pain worsens go to ED Continue to monitor blood glucose      I discussed the assessment and treatment plan with the patient. The patient was provided an opportunity to ask questions and all were answered. The patient agreed with the plan and demonstrated an understanding of the instructions.   The patient was advised to call back or seek an in-person evaluation if the symptoms worsen or if the condition fails to improve as anticipated.  The above assessment and management plan was discussed with the patient. The patient verbalized understanding of and has agreed to the management plan. Patient is aware to call the clinic if symptoms persist or worsen. Patient is aware when to return to the clinic for a follow-up visit. Patient educated on when it is appropriate to go to the emergency department.   Time call ended:  5:24 pm   I provided 14 minutes of face-to-face time during this encounter.    Evelina Dun, FNP

## 2020-12-24 ENCOUNTER — Telehealth: Payer: Self-pay | Admitting: Endocrinology

## 2020-12-24 NOTE — Telephone Encounter (Signed)
Patient requests to be called at ph# [phone] re:  Request for labs to be ordered to be done at our office to check for Pancreatitis

## 2020-12-26 NOTE — Telephone Encounter (Signed)
Please advise 

## 2020-12-26 NOTE — Telephone Encounter (Signed)
I do not understand what she means by checking for pancreatitis.  She will have to talk to her PCP to place orders

## 2020-12-28 NOTE — Telephone Encounter (Signed)
LVM--to call the office back. 

## 2021-01-01 NOTE — Telephone Encounter (Signed)
Spoke with pt and she stated that she has made arrangements to have this looked into further and needs no other assistance from the office regarding this matter.

## 2021-01-09 ENCOUNTER — Other Ambulatory Visit (INDEPENDENT_AMBULATORY_CARE_PROVIDER_SITE_OTHER): Payer: BC Managed Care – PPO

## 2021-01-09 ENCOUNTER — Encounter: Payer: Self-pay | Admitting: Physician Assistant

## 2021-01-09 ENCOUNTER — Other Ambulatory Visit (HOSPITAL_COMMUNITY)
Admission: RE | Admit: 2021-01-09 | Discharge: 2021-01-09 | Disposition: A | Discharge status: Home / Self Care | Payer: BC Managed Care – PPO | Source: Ambulatory Visit | Attending: Otolaryngology | Admitting: Otolaryngology

## 2021-01-09 ENCOUNTER — Ambulatory Visit: Payer: BC Managed Care – PPO | Admitting: Physician Assistant

## 2021-01-09 VITALS — BP 138/72 | HR 98 | Wt >= 6400 oz

## 2021-01-09 DIAGNOSIS — R112 Nausea with vomiting, unspecified: Secondary | ICD-10-CM

## 2021-01-09 DIAGNOSIS — Z9049 Acquired absence of other specified parts of digestive tract: Secondary | ICD-10-CM | POA: Diagnosis not present

## 2021-01-09 DIAGNOSIS — Z20822 Contact with and (suspected) exposure to covid-19: Secondary | ICD-10-CM | POA: Diagnosis not present

## 2021-01-09 DIAGNOSIS — Z8719 Personal history of other diseases of the digestive system: Secondary | ICD-10-CM

## 2021-01-09 DIAGNOSIS — Z01812 Encounter for preprocedural laboratory examination: Secondary | ICD-10-CM | POA: Insufficient documentation

## 2021-01-09 DIAGNOSIS — R1012 Left upper quadrant pain: Secondary | ICD-10-CM

## 2021-01-09 LAB — COMPREHENSIVE METABOLIC PANEL
ALT: 29 U/L (ref 0–35)
AST: 20 U/L (ref 0–37)
Albumin: 4.7 g/dL (ref 3.5–5.2)
Alkaline Phosphatase: 76 U/L (ref 39–117)
BUN: 12 mg/dL (ref 6–23)
CO2: 25 mEq/L (ref 19–32)
Calcium: 10.2 mg/dL (ref 8.4–10.5)
Chloride: 104 mEq/L (ref 96–112)
Creatinine, Ser: 0.82 mg/dL (ref 0.40–1.20)
GFR: 88.75 mL/min (ref >60.00)
Glucose, Bld: 114 mg/dL — ABNORMAL HIGH (ref 70–99)
Potassium: 3.8 mEq/L (ref 3.5–5.1)
Sodium: 138 mEq/L (ref 135–145)
Total Bilirubin: 0.4 mg/dL (ref 0.2–1.2)
Total Protein: 7.6 g/dL (ref 6.0–8.3)

## 2021-01-09 LAB — CBC WITH DIFFERENTIAL/PLATELET
Basophils Absolute: 0 10*3/uL (ref 0.0–0.1)
Basophils Relative: 0.5 % (ref 0.0–3.0)
Eosinophils Absolute: 0.1 10*3/uL (ref 0.0–0.7)
Eosinophils Relative: 1.1 % (ref 0.0–5.0)
HCT: 42.4 % (ref 36.0–46.0)
Hemoglobin: 14.2 g/dL (ref 12.0–15.0)
Lymphocytes Relative: 36.2 % (ref 12.0–46.0)
Lymphs Abs: 2.9 10*3/uL (ref 0.7–4.0)
MCHC: 33.5 g/dL (ref 30.0–36.0)
MCV: 88 fl (ref 78.0–100.0)
Monocytes Absolute: 0.4 10*3/uL (ref 0.1–1.0)
Monocytes Relative: 5.4 % (ref 3.0–12.0)
Neutro Abs: 4.6 10*3/uL (ref 1.4–7.7)
Neutrophils Relative %: 56.8 % (ref 43.0–77.0)
Platelets: 302 10*3/uL (ref 150.0–400.0)
RBC: 4.81 Mil/uL (ref 3.87–5.11)
RDW: 13.5 % (ref 11.5–15.5)
WBC: 8.1 10*3/uL (ref 4.0–10.5)

## 2021-01-09 LAB — SARS CORONAVIRUS 2 (TAT 6-24 HRS): SARS Coronavirus 2: NEGATIVE

## 2021-01-09 LAB — LIPASE: Lipase: 39 U/L (ref 11.0–59.0)

## 2021-01-09 NOTE — Progress Notes (Signed)
Subjective:    Patient ID: Angel Dawson, female    DOB: 06/05/1981, 40 y.o.   MRN: [id number]  HPI Floraine is a pleasant 40 year old African-American female, established with Dr. Havery Moros, and last seen in our office in 2017 by myself.  She is self-referred today. She says she had an acute episode about 3 weeks ago with abrupt onset of left upper quadrant pain that radiated around into her back and was associated with nausea and vomiting.  She says the left-sided upper abdominal pain was constant and lasted for about 3 days and then gradually resolved.  She put herself on a liquid diet during that time and then gradually advanced her diet.  She did not have any associated fever or chills, no diarrhea melena or hematochezia. Patient does have prior history of pancreatitis in 2014 and says this felt very similar to the pain she had with the pancreatitis for which she was hospitalized at that time.  She says this most recent episode was not as severe. Patient underwent laparoscopic cholecystectomy in 2014, did have cholelithiasis.  She required readmission about 6 days postop with an acute episode of pancreatitis.  She says she was told by the surgeon that she may have passed a stone.  She did not undergo ERCP but review of her chart shows that she did have elevated LFTs on admission.  MRCP had been done a couple of months prior to the cholecystectomy and did not show any evidence of choledocholithiasis. With the readmission with pancreatitis she had CT imaging which did not show any fluid collection within the gallbladder fossa and no significant biliary ductal dilation.Marland Kitchen  HIDA scan was negative for bile leak .  Patient says she has not had any other episodes of pancreatitis since that time.  She does not drink any regular EtOH.  Interestingly she had recently been placed on a new diabetic medication Xigduo prior to this recent episode of abdominal pain.  Initially she was taking 2 tablets at the same  time each day which was causing her to have diarrhea, she is now splitting the dose and says she seems to be tolerating okay.  No other new meds  Review of Systems Pertinent positive and negative review of systems were noted in the above HPI section.  All other review of systems was otherwise negative.  Outpatient Encounter Medications as of 01/09/2021  Medication Sig  . amLODipine (NORVASC) 5 MG tablet Take 1 tablet (5 mg total) by mouth daily.  Marland Kitchen aspirin-acetaminophen-caffeine (EXCEDRIN MIGRAINE) 250-250-65 MG tablet Take 2 tablets by mouth every 8 (eight) hours as needed for headache.  . Blood Glucose Monitoring Suppl (BLOOD GLUCOSE METER KIT AND SUPPLIES) Dispense based on patient and insurance preference. Use up to four times daily as directed. (FOR ICD-9 250.00, 250.01).  . Blood Glucose Monitoring Suppl ([place]) w/Device KIT 1 each by Does not apply route daily. Use as instructed to check blood sugar once daily.  . Dapagliflozin-metFORMIN HCl ER (XIGDUO XR) 02-999 MG TB24 Take 2 tablets by mouth daily.  . fluticasone (FLONASE) 50 MCG/ACT nasal spray USE 1 SPRAY IN EACH NOSTRIL EVERY DAY (Patient taking differently: Place 1 spray into both nostrils daily as needed for allergies.)  . gabapentin (NEURONTIN) 300 MG capsule Take 1 capsule by mouth once daily at bedtime. **needs appt for further refills.** (Patient taking differently: Take 300 mg by mouth at bedtime as needed (pain). Take 1 capsule by mouth once daily at bedtime as needed **  needs appt for further refills.**)  . lisinopril (ZESTRIL) 10 MG tablet Take 1 tablet (10 mg total) by mouth daily. Use as needed for elevated BP (Patient taking differently: Take 10 mg by mouth daily as needed (Use as needed for elevated BP).)  . lisinopril-hydrochlorothiazide (ZESTORETIC) 20-25 MG tablet Take 1 tablet by mouth daily.  . Multiple Vitamins-Minerals (MULTIVITAMIN WITH MINERALS) tablet Take 1 tablet by mouth daily. With Diabetic  Support  . OneTouch Delica Lancets 97W MISC 1 each by Does not apply route daily. Use as instructed to check blood sugar once daily.  Glory Rosebush Delica Lancets 26V MISC Use once daily to check glucose.  Glory Rosebush VERIO test strip USE TO TEST BLOOD SUGAR ONCE DAILY  . Probiotic Product (PROBIOTIC-10 PO) Take 1 capsule by mouth daily. Takes Fortify Women's Digestive Probiotic   No facility-administered encounter medications on file as of 01/09/2021.   No Known Allergies Patient Active Problem List   Diagnosis Date Noted  . Obstructive sleep apnea treated with continuous positive airway pressure (CPAP) 12/29/2017  . Onychomycosis 10/26/2014  . Gastroesophageal reflux disease without esophagitis 09/28/2014  . Diabetes mellitus type II, uncontrolled ([place]) 09/28/2014  . Hypertension 08/25/2014  . Morbid obesity ([place]) 08/25/2014  . Chronic rhinitis 06/30/2014  . Pancreatitis, acute 07/02/2013  . Gallstones 04/13/2013   Social History   Socioeconomic History  . Marital status: Single    Spouse name: Not on file  . Number of children: 0  . Years of education: Not on file  . Highest education level: Not on file  Occupational History  . Occupation: Animal nutritionist: [employer]  Tobacco Use  . Smoking status: Never Smoker  . Smokeless tobacco: Never Used  Substance and Sexual Activity  . Alcohol use: Yes    Alcohol/week: 0.0 standard drinks    Comment: 1 drink per month  . Drug use: No  . Sexual activity: Never    Birth control/protection: None  Other Topics Concern  . Not on file  Social History Narrative  . Not on file   Social Determinants of Health   Financial Resource Strain: Not on file  Food Insecurity: Not on file  Transportation Needs: Not on file  Physical Activity: Not on file  Stress: Not on file  Social Connections: Not on file  Intimate Partner Violence: Not on file    Ms. Saffran's family history includes Asthma in her father;  Diabetes in her father, maternal aunt, and mother; Goiter in her mother; Hypertension in her father and mother; Lung cancer in her paternal grandmother; Stroke in her paternal grandfather.      Objective:    Vitals:   01/09/21 0855  BP: 138/72  Pulse: 98    Physical Exam Well-developed well-nourished  AA female  in no acute distress.  Height, Weight 401 , BMI 57.5  HEENT; nontraumatic normocephalic, EOMI, PE R LA, sclera anicteric. Oropharynx; not examined today Neck; supple, no JVD Cardiovascular; regular rate and rhythm with S1-S2, no murmur rub or gallop Pulmonary; Clear bilaterally Abdomen; soft, morbidly obese ,nontender, nondistended, no palpable mass or hepatosplenomegaly, bowel sounds are active, laparoscopic cholecystectomy port incisional scars Rectal; not done today Skin; benign exam, no jaundice rash or appreciable lesions Extremities; no clubbing cyanosis or edema skin warm and dry Neuro/Psych; alert and oriented x4, grossly nonfocal mood and affect appropriate       Assessment & Plan:   #75 40 year old African-American female with recent acute episode of left upper quadrant  pain radiating into the back, lasting about 3 days and associated with nausea and vomiting on day 1. Pain very reminiscent of prior episode of pancreatitis which occurred post cholecystectomy in 2014 (probable p[assed stone . No ERCP)  Symptoms completely resolved at this time  I suspect she did have an episode of pancreatitis, will need to rule out choledocholithiasis, also consider medication induced i.e.Xigduo which she had just started prior to onset of this episode.  This medication can have a rare side effect of pancreatitis, on review  #2 status post cholecystectomy 2014/cholelithiasis 3.  Morbid obesity-BMI 57 4.  Adult onset diabetes mellitus 5.  Obstructive sleep apnea 6.  Hypertension #7 chronic sinusitis scheduled for septoplasty later this week  Plan; CBC with differential,  c-Met, lipase We will schedule for MRI/MRCP. Low-fat diet, avoid EtOH If labs and MRI are unremarkable, may need to consider discontinuation of Marshia Ly PA-C 01/09/2021   Cc: Darreld Mclean, MD

## 2021-01-09 NOTE — Patient Instructions (Signed)
If you are age 42 or older, your body mass index should be between 23-30. Your Body mass index is 57.54 kg/m. If this is out of the aforementioned range listed, please consider follow up with your Primary Care Provider.  If you are age 38 or younger, your body mass index should be between 19-25. Your Body mass index is 57.54 kg/m. If this is out of the aformentioned range listed, please consider follow up with your Primary Care Provider.   Your provider has requested that you go to the basement level for lab work before leaving today. Press "B" on the elevator. The lab is located at the first door on the left as you exit the elevator.  You have been scheduled for an MRI at [facility] on 01/28/2021. Your appointment time is 3:20 pm. Please arrive 30 minutes prior to your appointment time for registration purposes. Please make certain not to have anything to eat or drink 4 hours prior to your test. In addition, if you have any metal in your body, have a pacemaker or defibrillator, please be sure to let your ordering physician know. This test typically takes 45 minutes to 1 hour to complete. Should you need to reschedule, please call [phone] to do so. If you need to cancel please call with in 48 hours to avoid a no show fee. Make sure to wear cotton clothing and no metal.  Follow up pending the results of your labs and CT.  Thank you for entrusting me with your care and choosing [facility].  Amy Esterwood, PA-C

## 2021-01-10 ENCOUNTER — Other Ambulatory Visit: Payer: Self-pay

## 2021-01-10 ENCOUNTER — Encounter (HOSPITAL_COMMUNITY): Payer: Self-pay | Admitting: Otolaryngology

## 2021-01-10 NOTE — Progress Notes (Signed)
Angel Dawson denies chest pain or shortness of breath. Patient was tested for Covid and has been in quarantine since that time.  Angel Dawson has type II diabetes, patient reports that fasting CBGs run around 125-130. I instructed Angel. Dupuy to not take her oral medication for diabetes tonight or in am. I instructed patient to check CBG after awaking and every 2 hours until arrival  to the hospital.  I Instructed patient if CBG is less than 70 to drink1/2 cup of a clear juice. Recheck CBG in 15 minutes if CBG is not over 70 call, pre- op desk at [phone] for further instructions.

## 2021-01-10 NOTE — Progress Notes (Signed)
Agree with assessment and plan as outlined.  

## 2021-01-11 ENCOUNTER — Encounter (HOSPITAL_COMMUNITY): Payer: Self-pay | Admitting: Otolaryngology

## 2021-01-11 ENCOUNTER — Ambulatory Visit (HOSPITAL_COMMUNITY): Payer: BC Managed Care – PPO | Admitting: Certified Registered"

## 2021-01-11 ENCOUNTER — Ambulatory Visit (HOSPITAL_COMMUNITY)
Admission: RE | Admit: 2021-01-11 | Discharge: 2021-01-11 | Disposition: A | Discharge status: Home / Self Care | Payer: BC Managed Care – PPO | Attending: Otolaryngology | Admitting: Otolaryngology

## 2021-01-11 ENCOUNTER — Encounter (HOSPITAL_COMMUNITY)
Admission: RE | Disposition: A | Discharge status: Home / Self Care | Payer: Self-pay | Source: Home / Self Care | Attending: Otolaryngology

## 2021-01-11 DIAGNOSIS — Z8249 Family history of ischemic heart disease and other diseases of the circulatory system: Secondary | ICD-10-CM | POA: Diagnosis not present

## 2021-01-11 DIAGNOSIS — G4733 Obstructive sleep apnea (adult) (pediatric): Secondary | ICD-10-CM | POA: Diagnosis not present

## 2021-01-11 DIAGNOSIS — Z801 Family history of malignant neoplasm of trachea, bronchus and lung: Secondary | ICD-10-CM | POA: Diagnosis not present

## 2021-01-11 DIAGNOSIS — Z8349 Family history of other endocrine, nutritional and metabolic diseases: Secondary | ICD-10-CM | POA: Insufficient documentation

## 2021-01-11 DIAGNOSIS — E119 Type 2 diabetes mellitus without complications: Secondary | ICD-10-CM | POA: Diagnosis not present

## 2021-01-11 DIAGNOSIS — Z79899 Other long term (current) drug therapy: Secondary | ICD-10-CM | POA: Diagnosis not present

## 2021-01-11 DIAGNOSIS — Z833 Family history of diabetes mellitus: Secondary | ICD-10-CM | POA: Insufficient documentation

## 2021-01-11 DIAGNOSIS — Z7982 Long term (current) use of aspirin: Secondary | ICD-10-CM | POA: Insufficient documentation

## 2021-01-11 DIAGNOSIS — Z825 Family history of asthma and other chronic lower respiratory diseases: Secondary | ICD-10-CM | POA: Diagnosis not present

## 2021-01-11 DIAGNOSIS — J342 Deviated nasal septum: Secondary | ICD-10-CM

## 2021-01-11 DIAGNOSIS — J343 Hypertrophy of nasal turbinates: Secondary | ICD-10-CM | POA: Diagnosis not present

## 2021-01-11 DIAGNOSIS — Z7984 Long term (current) use of oral hypoglycemic drugs: Secondary | ICD-10-CM | POA: Insufficient documentation

## 2021-01-11 DIAGNOSIS — J3489 Other specified disorders of nose and nasal sinuses: Secondary | ICD-10-CM | POA: Diagnosis not present

## 2021-01-11 HISTORY — PX: NASAL SEPTOPLASTY W/ TURBINOPLASTY: SHX2070

## 2021-01-11 LAB — GLUCOSE, CAPILLARY
Glucose-Capillary: 150 mg/dL — ABNORMAL HIGH (ref 70–99)
Glucose-Capillary: 158 mg/dL — ABNORMAL HIGH (ref 70–99)

## 2021-01-11 LAB — POCT PREGNANCY, URINE: Preg Test, Ur: NEGATIVE

## 2021-01-11 SURGERY — SEPTOPLASTY, NOSE, WITH NASAL TURBINATE REDUCTION
Anesthesia: General | Laterality: Bilateral

## 2021-01-11 MED ORDER — MEPERIDINE HCL 25 MG/ML IJ SOLN: 6.2500 mg | INTRAMUSCULAR | Status: DC | PRN

## 2021-01-11 MED ORDER — ROCURONIUM BROMIDE 10 MG/ML (PF) SYRINGE
PREFILLED_SYRINGE | INTRAVENOUS | Status: AC
  Filled 2021-01-11: qty 10

## 2021-01-11 MED ORDER — LACTATED RINGERS IV SOLN: INTRAVENOUS | Status: DC

## 2021-01-11 MED ORDER — MUPIROCIN CALCIUM 2 % EX CREA
TOPICAL_CREAM | CUTANEOUS | Status: AC
  Filled 2021-01-11: qty 15

## 2021-01-11 MED ORDER — HYDROMORPHONE HCL 1 MG/ML IJ SOLN
0.2500 mg | INTRAMUSCULAR | Status: DC | PRN
  Administered 2021-01-11 (×2): 0.5 mg via INTRAVENOUS

## 2021-01-11 MED ORDER — CHLORHEXIDINE GLUCONATE 0.12 % MT SOLN
OROMUCOSAL | Status: AC
  Administered 2021-01-11: 15 mL via OROMUCOSAL
  Filled 2021-01-11: qty 15

## 2021-01-11 MED ORDER — LIDOCAINE-EPINEPHRINE 1 %-1:100000 IJ SOLN
INTRAMUSCULAR | Status: DC | PRN
  Administered 2021-01-11: 5 mL

## 2021-01-11 MED ORDER — ACETAMINOPHEN 500 MG PO TABS
1000.0000 mg | ORAL_TABLET | Freq: Once | ORAL | Status: AC
  Administered 2021-01-11: 1000 mg via ORAL
  Filled 2021-01-11: qty 2

## 2021-01-11 MED ORDER — DEXAMETHASONE SODIUM PHOSPHATE 10 MG/ML IJ SOLN
INTRAMUSCULAR | Status: DC | PRN
  Administered 2021-01-11: 10 mg via INTRAVENOUS

## 2021-01-11 MED ORDER — OXYCODONE HCL 5 MG/5ML PO SOLN: 5.0000 mg | Freq: Once | ORAL | Status: AC | PRN

## 2021-01-11 MED ORDER — PROPOFOL 10 MG/ML IV BOLUS
INTRAVENOUS | Status: DC | PRN
  Administered 2021-01-11: 200 mg via INTRAVENOUS

## 2021-01-11 MED ORDER — LACTATED RINGERS IV SOLN: INTRAVENOUS | Status: DC | PRN

## 2021-01-11 MED ORDER — OXYCODONE HCL 5 MG PO TABS
5.0000 mg | ORAL_TABLET | Freq: Once | ORAL | Status: AC | PRN
  Administered 2021-01-11: 5 mg via ORAL

## 2021-01-11 MED ORDER — ONDANSETRON HCL 4 MG/2ML IJ SOLN
INTRAMUSCULAR | Status: AC
  Filled 2021-01-11: qty 2

## 2021-01-11 MED ORDER — PROPOFOL 10 MG/ML IV BOLUS
INTRAVENOUS | Status: AC
  Filled 2021-01-11: qty 20

## 2021-01-11 MED ORDER — ONDANSETRON HCL 4 MG/2ML IJ SOLN
INTRAMUSCULAR | Status: DC | PRN
  Administered 2021-01-11: 4 mg via INTRAVENOUS

## 2021-01-11 MED ORDER — MUPIROCIN 2 % EX OINT
TOPICAL_OINTMENT | CUTANEOUS | Status: AC
  Filled 2021-01-11: qty 22

## 2021-01-11 MED ORDER — PROMETHAZINE HCL 25 MG/ML IJ SOLN: 6.2500 mg | INTRAMUSCULAR | Status: DC | PRN

## 2021-01-11 MED ORDER — SCOPOLAMINE 1 MG/3DAYS TD PT72
1.0000 | MEDICATED_PATCH | TRANSDERMAL | Status: DC
  Administered 2021-01-11: 1.5 mg via TRANSDERMAL
  Filled 2021-01-11: qty 1

## 2021-01-11 MED ORDER — HYDROMORPHONE HCL 1 MG/ML IJ SOLN
INTRAMUSCULAR | Status: AC
  Filled 2021-01-11: qty 1

## 2021-01-11 MED ORDER — SUGAMMADEX SODIUM 200 MG/2ML IV SOLN
INTRAVENOUS | Status: DC | PRN
  Administered 2021-01-11: 400 mg via INTRAVENOUS

## 2021-01-11 MED ORDER — LIDOCAINE 2% (20 MG/ML) 5 ML SYRINGE
INTRAMUSCULAR | Status: DC | PRN
  Administered 2021-01-11: 60 mg via INTRAVENOUS

## 2021-01-11 MED ORDER — FENTANYL CITRATE (PF) 250 MCG/5ML IJ SOLN
INTRAMUSCULAR | Status: DC | PRN
  Administered 2021-01-11: 50 ug via INTRAVENOUS
  Administered 2021-01-11: 100 ug via INTRAVENOUS

## 2021-01-11 MED ORDER — 0.9 % SODIUM CHLORIDE (POUR BTL) OPTIME
TOPICAL | Status: DC | PRN
  Administered 2021-01-11: 1000 mL

## 2021-01-11 MED ORDER — FENTANYL CITRATE (PF) 250 MCG/5ML IJ SOLN
INTRAMUSCULAR | Status: AC
  Filled 2021-01-11: qty 5

## 2021-01-11 MED ORDER — SUCCINYLCHOLINE CHLORIDE 200 MG/10ML IV SOSY
PREFILLED_SYRINGE | INTRAVENOUS | Status: AC
  Filled 2021-01-11: qty 10

## 2021-01-11 MED ORDER — LIDOCAINE-EPINEPHRINE 1 %-1:100000 IJ SOLN
INTRAMUSCULAR | Status: AC
  Filled 2021-01-11: qty 1

## 2021-01-11 MED ORDER — DEXTROSE 5 % IV SOLN
3.0000 g | INTRAVENOUS | Status: AC
  Administered 2021-01-11: 3 g via INTRAVENOUS
  Filled 2021-01-11 (×2): qty 3000

## 2021-01-11 MED ORDER — PHENYLEPHRINE 40 MCG/ML (10ML) SYRINGE FOR IV PUSH (FOR BLOOD PRESSURE SUPPORT)
PREFILLED_SYRINGE | INTRAVENOUS | Status: DC | PRN
  Administered 2021-01-11: 80 ug via INTRAVENOUS

## 2021-01-11 MED ORDER — SUCCINYLCHOLINE CHLORIDE 20 MG/ML IJ SOLN
INTRAMUSCULAR | Status: DC | PRN
  Administered 2021-01-11: 200 mg via INTRAVENOUS

## 2021-01-11 MED ORDER — OXYCODONE HCL 5 MG PO TABS
ORAL_TABLET | ORAL | Status: AC
  Filled 2021-01-11: qty 1

## 2021-01-11 MED ORDER — DEXAMETHASONE SODIUM PHOSPHATE 10 MG/ML IJ SOLN
INTRAMUSCULAR | Status: AC
  Filled 2021-01-11: qty 1

## 2021-01-11 MED ORDER — ORAL CARE MOUTH RINSE: 15.0000 mL | Freq: Once | OROMUCOSAL | Status: AC

## 2021-01-11 MED ORDER — MUPIROCIN 2 % EX OINT
TOPICAL_OINTMENT | CUTANEOUS | Status: DC | PRN
  Administered 2021-01-11: 1 via NASAL

## 2021-01-11 MED ORDER — ROCURONIUM BROMIDE 10 MG/ML (PF) SYRINGE
PREFILLED_SYRINGE | INTRAVENOUS | Status: DC | PRN
  Administered 2021-01-11: 40 mg via INTRAVENOUS

## 2021-01-11 MED ORDER — OXYMETAZOLINE HCL 0.05 % NA SOLN
NASAL | Status: AC
  Filled 2021-01-11: qty 30

## 2021-01-11 MED ORDER — MIDAZOLAM HCL 2 MG/2ML IJ SOLN
INTRAMUSCULAR | Status: AC
  Filled 2021-01-11: qty 2

## 2021-01-11 MED ORDER — LIDOCAINE 2% (20 MG/ML) 5 ML SYRINGE
INTRAMUSCULAR | Status: AC
  Filled 2021-01-11: qty 5

## 2021-01-11 MED ORDER — MIDAZOLAM HCL 5 MG/5ML IJ SOLN
INTRAMUSCULAR | Status: DC | PRN
  Administered 2021-01-11: 1 mg via INTRAVENOUS

## 2021-01-11 MED ORDER — CEPHALEXIN 500 MG PO CAPS: 500.0000 mg | ORAL_CAPSULE | Freq: Three times a day (TID) | ORAL | 0 refills | Status: AC

## 2021-01-11 MED ORDER — CHLORHEXIDINE GLUCONATE 0.12 % MT SOLN
15.0000 mL | Freq: Once | OROMUCOSAL | Status: AC
  Filled 2021-01-11: qty 15

## 2021-01-11 MED ORDER — OXYMETAZOLINE HCL 0.05 % NA SOLN
NASAL | Status: DC | PRN
  Administered 2021-01-11: 1

## 2021-01-11 SURGICAL SUPPLY — 24 items
CANISTER SUCT 3000ML PPV (MISCELLANEOUS) ×2 IMPLANT
COAGULATOR SUCT SWTCH 10FR 6 (ELECTROSURGICAL) IMPLANT
COVER WAND RF STERILE (DRAPES) ×2 IMPLANT
DRAPE HALF SHEET 40X57 (DRAPES) IMPLANT
ELECT REM PT RETURN 9FT ADLT (ELECTROSURGICAL)
ELECTRODE REM PT RTRN 9FT ADLT (ELECTROSURGICAL) IMPLANT
GAUZE SPONGE 2X2 8PLY STRL LF (GAUZE/BANDAGES/DRESSINGS) ×1 IMPLANT
GLOVE BIOGEL M 7.0 STRL (GLOVE) ×4 IMPLANT
GOWN STRL REUS W/ TWL LRG LVL3 (GOWN DISPOSABLE) ×2 IMPLANT
GOWN STRL REUS W/TWL LRG LVL3 (GOWN DISPOSABLE) ×4
KIT BASIN OR (CUSTOM PROCEDURE TRAY) ×2 IMPLANT
KIT TURNOVER KIT B (KITS) ×2 IMPLANT
NEEDLE HYPO 25GX1X1/2 BEV (NEEDLE) ×2 IMPLANT
NS IRRIG 1000ML POUR BTL (IV SOLUTION) ×2 IMPLANT
PAD ARMBOARD 7.5X6 YLW CONV (MISCELLANEOUS) ×2 IMPLANT
SPLINT NASAL DOYLE BI-VL (GAUZE/BANDAGES/DRESSINGS) ×2 IMPLANT
SPONGE GAUZE 2X2 STER 10/PKG (GAUZE/BANDAGES/DRESSINGS) ×1
SPONGE NEURO XRAY DETECT 1X3 (DISPOSABLE) ×2 IMPLANT
SUT ETHILON 3 0 PS 1 (SUTURE) ×2 IMPLANT
SUT PLAIN 4 0 ~~LOC~~ 1 (SUTURE) ×2 IMPLANT
TOWEL GREEN STERILE FF (TOWEL DISPOSABLE) ×2 IMPLANT
TRAY ENT MC OR (CUSTOM PROCEDURE TRAY) ×2 IMPLANT
TUBE SALEM SUMP 16 FR W/ARV (TUBING) ×2 IMPLANT
TUBING EXTENTION W/L.L. (IV SETS) ×2 IMPLANT

## 2021-01-11 NOTE — Anesthesia Procedure Notes (Signed)
Procedure Name: Intubation Date/Time: 01/11/2021 7:55 AM Performed by: Gaylene Brooks, CRNA Pre-anesthesia Checklist: Patient identified, Emergency Drugs available, Suction available and Patient being monitored Patient Re-evaluated:Patient Re-evaluated prior to induction Oxygen Delivery Method: Circle System Utilized Preoxygenation: Pre-oxygenation with 100% oxygen Induction Type: IV induction Ventilation: Mask ventilation without difficulty Laryngoscope Size: Miller and 2 Grade View: Grade II Tube type: Oral Tube size: 7.0 mm Number of attempts: 1 Airway Equipment and Method: Stylet and Oral airway Placement Confirmation: ETT inserted through vocal cords under direct vision,  positive ETCO2 and breath sounds checked- equal and bilateral Secured at: 22 cm Tube secured with: Tape Dental Injury: Teeth and Oropharynx as per pre-operative assessment

## 2021-01-11 NOTE — Anesthesia Preprocedure Evaluation (Signed)
Anesthesia Evaluation    Reviewed: Allergy & Precautions, Patient's Chart, lab work & pertinent test results  Airway Mallampati: II  TM Distance: >3 FB Neck ROM: Full    Dental no notable dental hx.    Pulmonary sleep apnea ,    Pulmonary exam normal breath sounds clear to auscultation       Cardiovascular hypertension, Pt. on medications Normal cardiovascular exam Rhythm:Regular Rate:Normal     Neuro/Psych  Headaches, negative psych ROS   GI/Hepatic Neg liver ROS, GERD  Controlled,  Endo/Other  diabetes, Well Controlled, Type 2, Oral Hypoglycemic AgentsMorbid obesityBMI 49 FS 150 this AM  a1c 6.8  Renal/GU negative Renal ROS  negative genitourinary   Musculoskeletal negative musculoskeletal ROS (+)   Abdominal (+) + obese,   Peds  Hematology negative hematology ROS (+) hct 42.4   Anesthesia Other Findings Nasal turbinate hypertrophy Deviated septum  Reproductive/Obstetrics negative OB ROS                             Anesthesia Physical Anesthesia Plan  ASA: III  Anesthesia Plan: General   Post-op Pain Management:    Induction: Intravenous  PONV Risk Score and Plan: 3 and Ondansetron, Dexamethasone, Midazolam, Treatment may vary due to age or medical condition and Scopolamine patch - Pre-op  Airway Management Planned: Oral ETT and Video Laryngoscope Planned  Additional Equipment: None  Intra-op Plan:   Post-operative Plan: Extubation in OR  Informed Consent: I have reviewed the patients History and Physical, chart, labs and discussed the procedure including the risks, benefits and alternatives for the proposed anesthesia with the patient or authorized representative who has indicated his/her understanding and acceptance.     Dental advisory given  Plan Discussed with: CRNA  Anesthesia Plan Comments:         Anesthesia Quick Evaluation

## 2021-01-11 NOTE — Anesthesia Postprocedure Evaluation (Signed)
Anesthesia Post Note  Patient: Angel Dawson  Procedure(s) Performed: NASAL SEPTOPLASTY WITH TURBINATE REDUCTION (Bilateral )     Patient location during evaluation: PACU Anesthesia Type: General Level of consciousness: awake and alert, oriented and patient cooperative Pain management: pain level controlled Vital Signs Assessment: post-procedure vital signs reviewed and stable Respiratory status: spontaneous breathing, nonlabored ventilation and respiratory function stable Cardiovascular status: blood pressure returned to baseline and stable Postop Assessment: no apparent nausea or vomiting Anesthetic complications: no   No complications documented.  Last Vitals:  Vitals:   01/11/21 0855 01/11/21 0910  BP: (!) 165/87 (!) 152/92  Pulse: 91 92  Resp: 12 16  Temp:  36.8 C  SpO2: 97% 95%    Last Pain:  Vitals:   01/11/21 0910  TempSrc:   PainSc: [place]

## 2021-01-11 NOTE — H&P (Signed)
Angel Dawson is an 40 y.o. female.   Chief Complaint: Nasal Obstruction HPI: Hx of progressive nasal obstruction  Past Medical History:  Diagnosis Date  . Abdominal pain    related to gallstones  . Achilles tendonitis, bilateral   . Bronchitis    02/17  . Diabetes ([place])    Type II  . DKA (diabetic ketoacidoses) 08/25/2014  . Gallstones   . GERD (gastroesophageal reflux disease)   . Headache    migraines  . Hypertension   . Lattice degeneration, both eyes   . Pancreatitis   . PCOS (polycystic ovarian syndrome)   . Sinus congestion    occ. uses OTC meds for this    Past Surgical History:  Procedure Laterality Date  . CHOLECYSTECTOMY N/A 06/22/2013   Procedure: LAPAROSCOPIC CHOLECYSTECTOMY ;  Surgeon: Adin Hector, MD;  Location: [facility];  Service: General;  Laterality: N/A;  . DILITATION & CURRETTAGE/HYSTROSCOPY WITH HYDROTHERMAL ABLATION N/A 01/03/2016   Procedure: DILATATION & CURETTAGE/HYSTEROSCOPY WITH HYDROTHERMAL ABLATION;  Surgeon: Crawford Givens, MD;  Location: [facility];  Service: Gynecology;  Laterality: N/A;  HTA rep will be attend confirmed by office 12/12/15   . TONSILLECTOMY      Family History  Problem Relation Age of Onset  . Hypertension Mother   . Diabetes Mother   . Goiter Mother   . Hypertension Father   . Diabetes Father   . Asthma Father   . Stroke Paternal Grandfather   . Lung cancer Paternal Grandmother        smoker-heavy  . Diabetes Maternal Aunt   . Colon cancer Neg Hx   . Heart disease Neg Hx    Social History:  reports that she has never smoked. She has never used smokeless tobacco. She reports previous alcohol use. She reports that she does not use drugs.  Allergies: No Known Allergies  Medications Prior to Admission  Medication Sig Dispense Refill  . amLODipine (NORVASC) 5 MG tablet Take 1 tablet (5 mg total) by mouth daily. 90 tablet 1  . aspirin-acetaminophen-caffeine (EXCEDRIN MIGRAINE) 250-250-65 MG tablet Take 2 tablets by  mouth every 8 (eight) hours as needed for headache.    . Dapagliflozin-metFORMIN HCl ER (XIGDUO XR) 02-999 MG TB24 Take 2 tablets by mouth daily. (Patient taking differently: Take 1 tablet by mouth 2 (two) times daily.) 60 tablet 1  . famotidine (PEPCID) 10 MG tablet Take 20 mg by mouth daily as needed for heartburn or indigestion.    . fluticasone (FLONASE) 50 MCG/ACT nasal spray USE 1 SPRAY IN EACH NOSTRIL EVERY DAY (Patient taking differently: Place 1 spray into both nostrils daily as needed for allergies.) 16 g 8  . gabapentin (NEURONTIN) 300 MG capsule Take 1 capsule by mouth once daily at bedtime. **needs appt for further refills.** (Patient taking differently: Take 300 mg by mouth at bedtime as needed (pain). Take 1 capsule by mouth once daily at bedtime as needed **needs appt for further refills.**) 90 capsule 0  . lisinopril-hydrochlorothiazide (ZESTORETIC) 20-25 MG tablet Take 1 tablet by mouth daily. 90 tablet 3  . Multiple Vitamins-Minerals (MULTIVITAMIN WITH MINERALS) tablet Take 1 tablet by mouth daily. With Diabetic Support    . Probiotic Product (PROBIOTIC-10 PO) Take 1 capsule by mouth daily. Takes Fortify Women's Digestive Probiotic    . Blood Glucose Monitoring Suppl (BLOOD GLUCOSE METER KIT AND SUPPLIES) Dispense based on patient and insurance preference. Use up to four times daily as directed. (FOR ICD-9 250.00, 250.01). 1 each 0  .  Blood Glucose Monitoring Suppl ([place]) w/Device KIT 1 each by Does not apply route daily. Use as instructed to check blood sugar once daily.    Marland Kitchen lisinopril (ZESTRIL) 10 MG tablet Take 1 tablet (10 mg total) by mouth daily. Use as needed for elevated BP (Patient taking differently: Take 10 mg by mouth daily as needed (Use as needed for elevated BP).) 90 tablet 1  . OneTouch Delica Lancets 19X MISC 1 each by Does not apply route daily. Use as instructed to check blood sugar once daily.    Glory Rosebush Delica Lancets 58I MISC Use once  daily to check glucose. 100 each 1  . ONETOUCH VERIO test strip USE TO TEST BLOOD SUGAR ONCE DAILY 100 strip 2    Results for orders placed or performed during the hospital encounter of 01/11/21 (from the past 48 hour(s))  Glucose, capillary     Status: Abnormal   Collection Time: 01/11/21  6:22 AM  Result Value Ref Range   Glucose-Capillary 150 (H) 70 - 99 mg/dL    Comment: Glucose reference range applies only to samples taken after fasting for at least 8 hours.  Pregnancy, urine POC     Status: None   Collection Time: 01/11/21  6:54 AM  Result Value Ref Range   Preg Test, Ur NEGATIVE NEGATIVE    Comment:        THE SENSITIVITY OF THIS METHODOLOGY IS >24 mIU/mL    No results found.  Review of Systems  HENT: Positive for congestion.   Respiratory: Negative.   Cardiovascular: Negative.     Blood pressure (!) 157/90, pulse (!) 103, temperature 98.6 F (37 C), temperature source Oral, resp. rate 18, height 5' 10"  (1.778 m), weight (!) 180.5 kg, SpO2 99 %. Physical Exam HENT:     Nose:     Comments: Deviated septum Cardiovascular:     Rate and Rhythm: Normal rate.  Pulmonary:     Effort: Pulmonary effort is normal.  Musculoskeletal:     Cervical back: Normal range of motion.      Assessment/Plan Adm for OP septoplasty and IT reduction  Jerrell Belfast, MD 01/11/2021, 7:27 AM

## 2021-01-11 NOTE — Transfer of Care (Signed)
Immediate Anesthesia Transfer of Care Note  Patient: Angel Dawson  Procedure(s) Performed: NASAL SEPTOPLASTY WITH TURBINATE REDUCTION (Bilateral )  Patient Location: PACU  Anesthesia Type:General  Level of Consciousness: awake, drowsy and patient cooperative  Airway & Oxygen Therapy: Patient Spontanous Breathing and Patient connected to face mask oxygen  Post-op Assessment: Report given to RN, Post -op Vital signs reviewed and stable and Patient moving all extremities X 4  Post vital signs: Reviewed and stable  Last Vitals:  Vitals Value Taken Time  BP 157/83 01/11/21 0841  Temp    Pulse 98 01/11/21 0841  Resp 16 01/11/21 0841  SpO2 100 % 01/11/21 0841  Vitals shown include unvalidated device data.  Last Pain:  Vitals:   01/11/21 0641  TempSrc:   PainSc: 3       Patients Stated Pain Goal: 1 (91/69/45 0388)  Complications: No complications documented.

## 2021-01-11 NOTE — Op Note (Signed)
Operative Note: SEPTOPLASTY AND INFERIOR TURBINATE REDUCTION  Patient: Angel Dawson  Medical record number: [id number]  Date:01/11/2021  Pre-operative Indications: 1. Deviated nasal septum with nasal airway obstruction     2.  Bilateral inferior turbinate hypertrophy  Postoperative Indications: Same  Surgical Procedure: 1.  Nasal Septoplasty    2.  Bilateral Inferior Turbinate Reduction  Anesthesia: GET  Surgeon: Delsa Bern, M.D.  Complications: None  EBL: 50 cc  Findings: Severely deviated nasal septum with airway obstruction and bilateral inferior turbinate hypertrophy.   Brief History: The patient is a 40 y.o. female with a history of progressive nasal airway obstruction. The patient has been on medical therapy to reduce nasal mucosal edema including saline nasal spray and topical nasal steroids. Despite appropriate medical therapy the patient continues to have ongoing symptoms. Given the patient's history and findings, the above surgical procedures were recommended, risks and benefits were discussed in detail with the patient may understand and agree with our plan for surgery which is scheduled at [facility] under general anesthesia as an outpatient.  Surgical Procedure: The patient is brought to the operating room on 01/11/2021 and placed in supine position on the operating table. General endotracheal anesthesia was established without difficulty. When the patient was adequately anesthetized, surgical timeout was performed and correct identification of the patient and the surgical procedure. The patient's nose was then injected with 6 cc of 1% lidocaine 1 100,000 dilution epinephrine which was injected in a submucosal fashion. The patient's nose was then packed with Afrin-soaked cottonoid pledgets were left in place for approximately 10 minutes lateral vasoconstriction and hemostasis.  With the patient prepped draped and prepared for surgery, nasal septoplasty was begun.   A left anterior hemitransfixion incision was created and a mucoperichondrial flap was elevated from anterior to posterior on the left-hand side. The anterior cartilaginous septum was crossed at the midline and a mucoperichondrial flap was elevated on the patient's right.  Swivel knife was then used to resect the anterior and mid cartilaginous portion of the nasal septum.  Resected cartilage was morcellized and returned to the mucoperichondrial pocket at the occlusion of the surgical procedure.  Dissection was then carried out from anterior to posterior removing deviated bone and cartilage including a large septal spur the overlying mucosa was preserved.  With the septum brought to good midline position, the morselized cartilage was returned to the mucoperichondrial pocket and the soft tissue/mucosal flaps were reapproximated with interrupted 4-0 gut suture on a Keith needle in a horizontal mattressing fashion.  Anterior hemitransfixion incision was closed with the same stitch.  Bilateral Doyle nasal septal splints were then placed after the application of Bactroban ointment and sutured in position with a 3-0 Ethilon suture.  Attention was then turned to the inferior turbinates, bilateral inferior turbinate intramural cautery was performed with cautery setting at 103 W.  2 submucosal passes were made in each inferior turbinate.  After completing cautery, anterior vertical incisions were created and overlying soft tissue was elevated, a small amount of turbinate bone was resected.  The turbinates were then outfractured to create a more patent nasal passageway.  Surgical sponge count was correct. An oral gastric tube was passed and the stomach contents were aspirated. Patient was awakened from anesthetic and transferred from the operating room to the recovery room in stable condition. There were no complications and blood loss was 50cc.   Delsa Bern, M.D. [facility] 01/11/2021

## 2021-01-12 ENCOUNTER — Encounter (HOSPITAL_COMMUNITY): Payer: Self-pay | Admitting: Otolaryngology

## 2021-01-28 ENCOUNTER — Ambulatory Visit
Admission: RE | Admit: 2021-01-28 | Discharge: 2021-01-28 | Disposition: A | Discharge status: Home / Self Care | Payer: BC Managed Care – PPO | Source: Ambulatory Visit | Attending: Physician Assistant | Admitting: Physician Assistant

## 2021-01-28 DIAGNOSIS — R1012 Left upper quadrant pain: Secondary | ICD-10-CM

## 2021-01-28 DIAGNOSIS — N281 Cyst of kidney, acquired: Secondary | ICD-10-CM | POA: Diagnosis not present

## 2021-01-28 DIAGNOSIS — R112 Nausea with vomiting, unspecified: Secondary | ICD-10-CM

## 2021-01-28 DIAGNOSIS — R935 Abnormal findings on diagnostic imaging of other abdominal regions, including retroperitoneum: Secondary | ICD-10-CM | POA: Diagnosis not present

## 2021-01-28 DIAGNOSIS — K760 Fatty (change of) liver, not elsewhere classified: Secondary | ICD-10-CM | POA: Diagnosis not present

## 2021-01-28 DIAGNOSIS — Z9049 Acquired absence of other specified parts of digestive tract: Secondary | ICD-10-CM

## 2021-01-28 DIAGNOSIS — Z8719 Personal history of other diseases of the digestive system: Secondary | ICD-10-CM

## 2021-01-28 MED ORDER — GADOBENATE DIMEGLUMINE 529 MG/ML IV SOLN
20.0000 mL | Freq: Once | INTRAVENOUS | Status: AC | PRN
  Administered 2021-01-28: 20 mL via INTRAVENOUS

## 2021-02-06 ENCOUNTER — Other Ambulatory Visit: Payer: Self-pay | Admitting: Family Medicine

## 2021-02-06 DIAGNOSIS — I10 Essential (primary) hypertension: Secondary | ICD-10-CM

## 2021-02-07 ENCOUNTER — Other Ambulatory Visit: Payer: BC Managed Care – PPO

## 2021-02-14 NOTE — Progress Notes (Signed)
This encounter was created in error - please disregard.

## 2021-02-15 ENCOUNTER — Encounter: Payer: BC Managed Care – PPO | Admitting: Endocrinology

## 2021-02-17 NOTE — Patient Instructions (Addendum)
It was great to see you again today! I will be in touch with your labs asap  Assuming all is well we can plan to visit in about 6 months

## 2021-02-17 NOTE — Progress Notes (Addendum)
[facility] [place], [place], [place] [address] [phone]  Date:  02/20/2021   Name:  ALEANE Dawson   DOB:  19-Nov-1980   MRN:  [id number]  PCP:  Darreld Mclean, MD    Chief Complaint: Hypertension (Follow up, 118/83 home reading average)   History of Present Illness:  Angel Dawson is a 40 y.o. very pleasant female patient who presents with the following:  Patient here today for blood pressure follow-up Last seen by myself for a virtual visit in September 2021-  history of DM, OSA, HTN, obesity, gallstone pancreatitis s/p cholecystectomy  She has had some issues with high blood pressure and headaches  She underwent surgery last month for a deviated septum- this does seem to be working well for her and her breathing is improved  She had been feeling really tired and saw ENT for her sx She does use CPAP but has not since her surgery - as she is still healing, the plan is to restart CPAP in the next few weeks  She is status post endometrial ablation so she does not get periods Pregnancy is not a concern as she is not sexually active at this time She does note that she still tends to be tired, would like to check vitamin D and thyroid level, as well as [place] to determine if she might be approaching menopause  Her endocrinologist is Dr.Kumar   She is taking amlodipine, and hctz/lisinopril  She does check her home BP and has had good control She has not needed her extra lisinopril   Lab Results  Component Value Date   HGBA1C 6.8 (H) 11/07/2020   Pap screening - she sees GYN  COVID series up-to-date Patient Active Problem List   Diagnosis Date Noted  . Deviated septum 01/11/2021  . Obstructive sleep apnea treated with continuous positive airway pressure (CPAP) 12/29/2017  . Onychomycosis 10/26/2014  . Gastroesophageal reflux disease without esophagitis 09/28/2014  . Diabetes mellitus type II,  uncontrolled ([place]) 09/28/2014  . Hypertension 08/25/2014  . Morbid obesity ([place]) 08/25/2014  . Chronic rhinitis 06/30/2014  . Pancreatitis, acute 07/02/2013  . Gallstones 04/13/2013    Past Medical History:  Diagnosis Date  . Abdominal pain    related to gallstones  . Achilles tendonitis, bilateral   . Bronchitis    02/17  . Diabetes ([place])    Type II  . DKA (diabetic ketoacidoses) 08/25/2014  . Gallstones   . GERD (gastroesophageal reflux disease)   . Headache    migraines  . Hypertension   . Lattice degeneration, both eyes   . Pancreatitis   . PCOS (polycystic ovarian syndrome)   . Sinus congestion    occ. uses OTC meds for this    Past Surgical History:  Procedure Laterality Date  . CHOLECYSTECTOMY N/A 06/22/2013   Procedure: LAPAROSCOPIC CHOLECYSTECTOMY ;  Surgeon: Adin Hector, MD;  Location: [facility];  Service: General;  Laterality: N/A;  . DILITATION & CURRETTAGE/HYSTROSCOPY WITH HYDROTHERMAL ABLATION N/A 01/03/2016   Procedure: DILATATION & CURETTAGE/HYSTEROSCOPY WITH HYDROTHERMAL ABLATION;  Surgeon: Crawford Givens, MD;  Location: [facility];  Service: Gynecology;  Laterality: N/A;  HTA rep will be attend confirmed by office 12/12/15   . NASAL SEPTOPLASTY W/ TURBINOPLASTY Bilateral 01/11/2021   Procedure: NASAL SEPTOPLASTY WITH TURBINATE REDUCTION;  Surgeon: Jerrell Belfast, MD;  Location: [facility];  Service: ENT;  Laterality: Bilateral;  . TONSILLECTOMY  Social History   Tobacco Use  . Smoking status: Never Smoker  . Smokeless tobacco: Never Used  Vaping Use  . Vaping Use: Never used  Substance Use Topics  . Alcohol use: Not Currently    Alcohol/week: 0.0 standard drinks    Comment: 1 drink per month  . Drug use: No    Family History  Problem Relation Age of Onset  . Hypertension Mother   . Diabetes Mother   . Goiter Mother   . Hypertension Father   . Diabetes Father   . Asthma Father   . Stroke Paternal Grandfather   . Lung cancer Paternal  Grandmother        smoker-heavy  . Diabetes Maternal Aunt   . Colon cancer Neg Hx   . Heart disease Neg Hx     No Known Allergies  Medication list has been reviewed and updated.  Current Outpatient Medications on File Prior to Visit  Medication Sig Dispense Refill  . amLODipine (NORVASC) 5 MG tablet Take 1 tablet (5 mg total) by mouth daily. 90 tablet 1  . aspirin-acetaminophen-caffeine (EXCEDRIN MIGRAINE) 250-250-65 MG tablet Take 2 tablets by mouth every 8 (eight) hours as needed for headache.    . Blood Glucose Monitoring Suppl (BLOOD GLUCOSE METER KIT AND SUPPLIES) Dispense based on patient and insurance preference. Use up to four times daily as directed. (FOR ICD-9 250.00, 250.01). 1 each 0  . famotidine (PEPCID) 10 MG tablet Take 20 mg by mouth daily as needed for heartburn or indigestion.    . gabapentin (NEURONTIN) 300 MG capsule Take 1 capsule by mouth once daily at bedtime. **needs appt for further refills.** (Patient taking differently: Take 300 mg by mouth at bedtime as needed (pain). Take 1 capsule by mouth once daily at bedtime as needed **needs appt for further refills.**) 90 capsule 0  . lisinopril-hydrochlorothiazide (ZESTORETIC) 20-25 MG tablet TAKE 1 TABLET DAILY 90 tablet 0  . Multiple Vitamins-Minerals (MULTIVITAMIN WITH MINERALS) tablet Take 1 tablet by mouth daily. With Diabetic Support    . OneTouch Delica Lancets 54O MISC Use once daily to check glucose. 100 each 1  . ONETOUCH VERIO test strip USE TO TEST BLOOD SUGAR ONCE DAILY 100 strip 2  . Probiotic Product (PROBIOTIC-10 PO) Take 1 capsule by mouth daily. Takes Fortify Women's Digestive Probiotic    . Dapagliflozin-metFORMIN HCl ER (XIGDUO XR) 02-999 MG TB24 Take 2 tablets by mouth daily. (Patient not taking: Reported on 02/20/2021) 60 tablet 1  . lisinopril (ZESTRIL) 10 MG tablet Take 1 tablet (10 mg total) by mouth daily. Use as needed for elevated BP (Patient not taking: Reported on 02/20/2021) 90 tablet 1   No  current facility-administered medications on file prior to visit.    Review of Systems:  As per HPI- otherwise negative.   Physical Examination: Vitals:   02/20/21 1130  BP: 124/88  Pulse: 90  Resp: 18  Temp: 97.9 F (36.6 C)  SpO2: 99%   Vitals:   02/20/21 1130  Weight: (!) 411 lb (186.4 kg)  Height: 6' (1.829 m)   Body mass index is 55.74 kg/m. Ideal Body Weight: Weight in (lb) to have BMI = 25: 183.9  GEN: no acute distress.  Obese, tall build HEENT: Atraumatic, Normocephalic.  Ears and Nose: No external deformity. CV: RRR, No M/G/R. No JVD. No thrill. No extra heart sounds. PULM: CTA B, no wheezes, crackles, rhonchi. No retractions. No resp. distress. No accessory muscle use. EXTR: No c/c/e PSYCH: Normally interactive.  Conversant.    Assessment and Plan: Amenorrhea - Plan: TSH, FSH  Essential hypertension, benign - Plan: lisinopril-hydrochlorothiazide (ZESTORETIC) 20-25 MG tablet, amLODipine (NORVASC) 5 MG tablet  Vitamin D deficiency - Plan: VITAMIN D 25 Hydroxy (Vit-D Deficiency, Fractures)  Fatigue, unspecified type - Plan: TSH  Patient seen today for blood pressure follow-up.  Her blood pressure looks good in the office and she is also following it at home.  Well-controlled.  Continue lisinopril/HCTZ and amlodipine.  She does have an additional 10 mg lisinopril pill she can take as needed, but has not used recently  We will follow-up on vitamin D, check thyroid and FSH today  Will plan further follow- up pending labs.  This visit occurred during the SARS-CoV-2 public health emergency.  Safety protocols were in place, including screening questions prior to the visit, additional usage of staff PPE, and extensive cleaning of exam room while observing appropriate contact time as indicated for disinfecting solutions.    Signed Lamar Blinks, MD  Received her labs as below, message to pt  FSH mIU/ML 3.1   Comment: Female Reference Range: 1.4-18.1  mIU/mLFemale Reference Range:Follicular Phase     4.1-63.8 mIU/mLMidCycle Peak     3.4-33.4 mIU/mLLuteal Phase     1.5-9.1 mIU/mLPost Menopausal   23.0-116.3 mIU/mLPregnant     <0.3 mIU/mL     Results for orders placed or performed in visit on 02/20/21  VITAMIN D 25 Hydroxy (Vit-D Deficiency, Fractures)  Result Value Ref Range   VITD 20.26 (L) 30.00 - 100.00 ng/mL  TSH  Result Value Ref Range   TSH 2.29 0.35 - 4.50 uIU/mL  FSH  Result Value Ref Range   FSH 3.1 mIU/ML

## 2021-02-20 ENCOUNTER — Other Ambulatory Visit: Payer: Self-pay

## 2021-02-20 ENCOUNTER — Ambulatory Visit: Payer: BC Managed Care – PPO | Admitting: Family Medicine

## 2021-02-20 ENCOUNTER — Encounter: Payer: Self-pay | Admitting: Family Medicine

## 2021-02-20 VITALS — BP 124/88 | HR 90 | Temp 97.9°F | Resp 18 | Ht 72.0 in | Wt >= 6400 oz

## 2021-02-20 DIAGNOSIS — N912 Amenorrhea, unspecified: Secondary | ICD-10-CM

## 2021-02-20 DIAGNOSIS — R5383 Other fatigue: Secondary | ICD-10-CM

## 2021-02-20 DIAGNOSIS — I10 Essential (primary) hypertension: Secondary | ICD-10-CM

## 2021-02-20 DIAGNOSIS — E559 Vitamin D deficiency, unspecified: Secondary | ICD-10-CM | POA: Diagnosis not present

## 2021-02-20 LAB — VITAMIN D 25 HYDROXY (VIT D DEFICIENCY, FRACTURES): VITD: 20.26 ng/mL — ABNORMAL LOW (ref 30.00–100.00)

## 2021-02-20 LAB — TSH: TSH: 2.29 u[IU]/mL (ref 0.35–4.50)

## 2021-02-20 LAB — FOLLICLE STIMULATING HORMONE: FSH: 3.1 m[IU]/mL

## 2021-02-20 MED ORDER — VITAMIN D3 1.25 MG (50000 UT) PO CAPS: ORAL_CAPSULE | ORAL | 0 refills | Status: DC

## 2021-02-20 MED ORDER — LISINOPRIL-HYDROCHLOROTHIAZIDE 20-25 MG PO TABS: 1.0000 | ORAL_TABLET | Freq: Every day | ORAL | 3 refills | Status: DC

## 2021-02-20 MED ORDER — AMLODIPINE BESYLATE 5 MG PO TABS: 5.0000 mg | ORAL_TABLET | Freq: Every day | ORAL | 3 refills | Status: DC

## 2021-02-20 NOTE — Addendum Note (Signed)
Addended by: Lamar Blinks C on: 02/20/2021 08:21 PM   Modules accepted: Orders

## 2021-03-26 ENCOUNTER — Encounter: Payer: Self-pay | Admitting: Family Medicine

## 2021-03-26 DIAGNOSIS — E1165 Type 2 diabetes mellitus with hyperglycemia: Secondary | ICD-10-CM

## 2021-04-05 ENCOUNTER — Telehealth: Payer: Self-pay | Admitting: Endocrinology

## 2021-04-05 DIAGNOSIS — E1165 Type 2 diabetes mellitus with hyperglycemia: Secondary | ICD-10-CM

## 2021-04-05 NOTE — Telephone Encounter (Signed)
Pt is needing a refill for   ONETOUCH VERIO test strip   [id number] - [place], [place] - [address] AT [place]

## 2021-04-06 MED ORDER — ONETOUCH VERIO VI STRP: ORAL_STRIP | 2 refills | Status: DC

## 2021-04-06 NOTE — Telephone Encounter (Signed)
Rx sent to preferred pharmacy.

## 2021-04-16 DIAGNOSIS — F4320 Adjustment disorder, unspecified: Secondary | ICD-10-CM | POA: Diagnosis not present

## 2021-04-17 ENCOUNTER — Telehealth: Payer: Self-pay | Admitting: Pharmacy Technician

## 2021-04-17 NOTE — Telephone Encounter (Addendum)
Patient Advocate Encounter   Received notification from [facility] that prior authorization for XIGDUO is required.   PA submitted on 04/17/2021 Key [id number] Status is APPROVED through 04/17/2022 Case: [id number]    [facility] will continue to follow.   Venida Jarvis. Nadara Mustard, CPhT Patient Advocate [facility] Phone: [phone] Fax:  [phone]

## 2021-04-26 DIAGNOSIS — F4320 Adjustment disorder, unspecified: Secondary | ICD-10-CM | POA: Diagnosis not present

## 2021-05-06 ENCOUNTER — Other Ambulatory Visit: Payer: Self-pay

## 2021-05-06 ENCOUNTER — Other Ambulatory Visit (INDEPENDENT_AMBULATORY_CARE_PROVIDER_SITE_OTHER): Payer: BC Managed Care – PPO

## 2021-05-06 DIAGNOSIS — E1165 Type 2 diabetes mellitus with hyperglycemia: Secondary | ICD-10-CM | POA: Diagnosis not present

## 2021-05-06 LAB — BASIC METABOLIC PANEL
BUN: 10 mg/dL (ref 6–23)
CO2: 28 mEq/L (ref 19–32)
Calcium: 9.7 mg/dL (ref 8.4–10.5)
Chloride: 100 mEq/L (ref 96–112)
Creatinine, Ser: 0.84 mg/dL (ref 0.40–1.20)
GFR: 86.03 mL/min (ref >60.00)
Glucose, Bld: 275 mg/dL — ABNORMAL HIGH (ref 70–99)
Potassium: 4.1 mEq/L (ref 3.5–5.1)
Sodium: 135 mEq/L (ref 135–145)

## 2021-05-06 LAB — MICROALBUMIN / CREATININE URINE RATIO
Creatinine,U: 66.7 mg/dL
Microalb Creat Ratio: 8.6 mg/g (ref 0.0–30.0)
Microalb, Ur: 5.8 mg/dL — ABNORMAL HIGH (ref 0.0–1.9)

## 2021-05-07 LAB — HEMOGLOBIN A1C: Hgb A1c MFr Bld: 8.3 % — ABNORMAL HIGH (ref 4.6–6.5)

## 2021-05-08 ENCOUNTER — Ambulatory Visit: Payer: BC Managed Care – PPO | Admitting: Endocrinology

## 2021-05-08 ENCOUNTER — Other Ambulatory Visit: Payer: Self-pay

## 2021-05-08 ENCOUNTER — Encounter: Payer: Self-pay | Admitting: Endocrinology

## 2021-05-08 VITALS — BP 132/90 | HR 96 | Ht 72.0 in | Wt >= 6400 oz

## 2021-05-08 DIAGNOSIS — E1165 Type 2 diabetes mellitus with hyperglycemia: Secondary | ICD-10-CM | POA: Diagnosis not present

## 2021-05-08 DIAGNOSIS — E7800 Pure hypercholesterolemia, unspecified: Secondary | ICD-10-CM | POA: Diagnosis not present

## 2021-05-08 DIAGNOSIS — I10 Essential (primary) hypertension: Secondary | ICD-10-CM

## 2021-05-08 MED ORDER — METFORMIN HCL ER 750 MG PO TB24: 750.0000 mg | ORAL_TABLET | Freq: Every day | ORAL | 1 refills | Status: DC

## 2021-05-08 MED ORDER — FREESTYLE LIBRE 2 SENSOR MISC: 2.0000 | 3 refills | Status: DC

## 2021-05-08 NOTE — Progress Notes (Signed)
Patient ID: Angel Dawson, female   DOB: Aug 04, 1981, 40 y.o.   MRN: [id number]          Reason for Appointment: Follow-up Type 2 Diabetes  Referring physician: [name] [name]    History of Present Illness:          Date of diagnosis of type 2 diabetes mellitus:  2015      Background history:   At the time of diagnosis and 2015 she had extreme thirst and urination and was admitted to the hospital with blood sugars over 400 but no overt acidosis However she was discharged only on metformin 500 mg twice a day which she has continued Prior to that in 2014 she appears to have had mild hyperglycemia with blood sugars up to 195  Recent history:    Non-insulin hypoglycemic drugs the patient is taking are: Metformin Er 750 mg twice a day   Her A1c is back up to 8.3, has been low as 6.5    Current management, blood sugar patterns and problems identified: She has not been able to get her medications regularly especially in the last month or so Previously her insurance had denied coverage for Invokana and now for Xigduo  She thinks her blood sugars were relatively good with Xigduo but overall has gained weight in the last 3 to 4 months She has not done exercise any recently She feels that she can do better with her diet again Also unless she takes her metformin with food she will get diarrhea, has mostly taken only 1 Metformin daily Lab glucose was 275 She is asking for [facility] as she does not like to take medications on a daily basis She was referred to the nutritionist previously when she did not make an appointment  Side effects from medications have been: Diarrhea from high dose metformin  Glucose monitoring:  done less than 1 times a day         Glucometer:  One Touch   Home blood sugar readings   PRE-MEAL Morning Midday Dinner Overnight Overall  Glucose range:     154-266  Mean/median: 229 798 921 194 174    Blood sugar patterns: Checking blood sugars mostly at  various hours of the mornings and generally will have higher readings after lunch if checked   Self-care:  Breakfast grits, bacon, sometimes omelette              Dietician visit, most recent: 05/2017, previously in classes                Weight history:  Wt Readings from Last 3 Encounters:  05/08/21 (!) 411 lb 12.8 oz (186.8 kg)  02/20/21 (!) 411 lb (186.4 kg)  01/11/21 (!) 398 lb (180.5 kg)    Glycemic control:    Lab Results  Component Value Date   HGBA1C 8.3 (H) 05/06/2021   HGBA1C 6.8 (H) 11/07/2020   HGBA1C 6.5 02/17/2020   Lab Results  Component Value Date   MICROALBUR 5.8 (H) 05/06/2021   LDLCALC 98 11/07/2020   CREATININE 0.84 05/06/2021   Lab Results  Component Value Date   MICRALBCREAT 8.6 05/06/2021    Lab Results  Component Value Date   FRUCTOSAMINE 269 12/27/2018   FRUCTOSAMINE 258 06/12/2017   Lab on 05/06/2021  Component Date Value Ref Range Status   Microalb, Ur 05/06/2021 5.8 (A) 0.0 - 1.9 mg/dL Final   Creatinine,U 05/06/2021 66.7  mg/dL Final   Microalb Creat Ratio 05/06/2021 8.6  0.0 -  30.0 mg/g Final   Sodium 05/06/2021 135  135 - 145 mEq/L Final   Potassium 05/06/2021 4.1  3.5 - 5.1 mEq/L Final   Chloride 05/06/2021 100  96 - 112 mEq/L Final   CO2 05/06/2021 28  19 - 32 mEq/L Final   Glucose, Bld 05/06/2021 275 (A) 70 - 99 mg/dL Final   BUN 05/06/2021 10  6 - 23 mg/dL Final   Creatinine, Ser 05/06/2021 0.84  0.40 - 1.20 mg/dL Final   GFR 05/06/2021 86.03  >60.00 mL/min Final   Calculated using the CKD-EPI Creatinine Equation (2021)   Calcium 05/06/2021 9.7  8.4 - 10.5 mg/dL Final   Hgb A1c MFr Bld 05/06/2021 8.3 (A) 4.6 - 6.5 % Final   Glycemic Control Guidelines for People with Diabetes:Non Diabetic:  <6%Goal of Therapy: <7%Additional Action Suggested:  >8%       Allergies as of 05/08/2021   No Known Allergies      Medication List        Accurate as of May 08, 2021  2:57 PM. If you have any questions, ask your nurse or  doctor.          STOP taking these medications    Xigduo XR 02-999 MG Tb24 Generic drug: Dapagliflozin-metFORMIN HCl ER Stopped by: Elayne Snare, MD       TAKE these medications    Alpha-Lipoic Acid 300 MG Caps Take by mouth.   amLODipine 5 MG tablet Commonly known as: NORVASC Take 1 tablet (5 mg total) by mouth daily.   aspirin-acetaminophen-caffeine 250-250-65 MG tablet Commonly known as: EXCEDRIN MIGRAINE Take 2 tablets by mouth every 8 (eight) hours as needed for headache.   BENFOTIAMINE PO Take by mouth.   blood glucose meter kit and supplies Dispense based on patient and insurance preference. Use up to four times daily as directed. (FOR ICD-9 250.00, 250.01).   famotidine 10 MG tablet Commonly known as: PEPCID Take 20 mg by mouth daily as needed for heartburn or indigestion.   FreeStyle Libre 2 Sensor Misc 2 Devices by Does not apply route every 14 (fourteen) days. Started by: Elayne Snare, MD   gabapentin 300 MG capsule Commonly known as: NEURONTIN Take 1 capsule by mouth once daily at bedtime. **needs appt for further refills.** What changed:  how much to take how to take this when to take this reasons to take this additional instructions   lisinopril 10 MG tablet Commonly known as: ZESTRIL Take 1 tablet (10 mg total) by mouth daily. Use as needed for elevated BP   lisinopril-hydrochlorothiazide 20-25 MG tablet Commonly known as: ZESTORETIC Take 1 tablet by mouth daily.   metFORMIN 750 MG 24 hr tablet Commonly known as: Glucophage XR Take 1 tablet (750 mg total) by mouth daily with breakfast. Started by: Elayne Snare, MD   multivitamin with minerals tablet Take 1 tablet by mouth daily. With Diabetic Support   OneTouch Delica Lancets 90Z Misc Use once daily to check glucose.   OneTouch Verio test strip Generic drug: glucose blood USE TO TEST BLOOD SUGAR ONCE DAILY   PROBIOTIC-10 PO Take 1 capsule by mouth daily. Takes Fortify Women's  Digestive Probiotic   Vitamin D3 1.25 MG (50000 UT) Caps Take 1 weekly for 12 weeks        Allergies: No Known Allergies  Past Medical History:  Diagnosis Date   Abdominal pain    related to gallstones   Achilles tendonitis, bilateral    Bronchitis    02/17  Diabetes ([place])    Type II   DKA (diabetic ketoacidoses) 08/25/2014   Gallstones    GERD (gastroesophageal reflux disease)    Headache    migraines   Hypertension    Lattice degeneration, both eyes    Pancreatitis    PCOS (polycystic ovarian syndrome)    Sinus congestion    occ. uses OTC meds for this    Past Surgical History:  Procedure Laterality Date   CHOLECYSTECTOMY N/A 06/22/2013   Procedure: LAPAROSCOPIC CHOLECYSTECTOMY ;  Surgeon: Adin Hector, MD;  Location: [facility];  Service: General;  Laterality: N/A;   DILITATION & CURRETTAGE/HYSTROSCOPY WITH HYDROTHERMAL ABLATION N/A 01/03/2016   Procedure: DILATATION & CURETTAGE/HYSTEROSCOPY WITH HYDROTHERMAL ABLATION;  Surgeon: Crawford Givens, MD;  Location: [facility];  Service: Gynecology;  Laterality: N/A;  HTA rep will be attend confirmed by office 12/12/15    NASAL SEPTOPLASTY W/ TURBINOPLASTY Bilateral 01/11/2021   Procedure: NASAL SEPTOPLASTY WITH TURBINATE REDUCTION;  Surgeon: Jerrell Belfast, MD;  Location: [facility];  Service: ENT;  Laterality: Bilateral;   TONSILLECTOMY      Family History  Problem Relation Age of Onset   Hypertension Mother    Diabetes Mother    Goiter Mother    Hypertension Father    Diabetes Father    Asthma Father    Stroke Paternal Grandfather    Lung cancer Paternal Grandmother        smoker-heavy   Diabetes Maternal Aunt    Colon cancer Neg Hx    Heart disease Neg Hx     Social History:  reports that she has never smoked. She has never used smokeless tobacco. She reports previous alcohol use. She reports that she does not use drugs.   Review of Systems   Lipid history: Has not been on any statin drugs  with variable  results LDL is below 100     Lab Results  Component Value Date   CHOL 173 11/07/2020   CHOL 184 03/11/2019   CHOL 156 10/15/2017   Lab Results  Component Value Date   HDL 39.20 11/07/2020   HDL 41.70 03/11/2019   HDL 38.00 (L) 10/15/2017   Lab Results  Component Value Date   LDLCALC 98 11/07/2020   LDLCALC 106 (H) 03/11/2019   LDLCALC 90 10/15/2017   Lab Results  Component Value Date   TRIG 179.0 (H) 11/07/2020   TRIG 183.0 (H) 03/11/2019   TRIG 143.0 10/15/2017   Lab Results  Component Value Date   CHOLHDL 4 11/07/2020   CHOLHDL 4 03/11/2019   CHOLHDL 4 10/15/2017   Lab Results  Component Value Date   LDLDIRECT 87.0 05/16/2019   LDLDIRECT 87.5 09/22/2014            Hypertension: Present for the last 10 years, monitored by PCP  This is followed by her PCP  She is taking both amlodipine and lisinopril HCTZ      BP Readings from Last 3 Encounters:  05/08/21 132/90  02/20/21 124/88  01/11/21 (!) 152/92    Most recent eye exam was 6/18, previously followed by a retina specialist but no diabetic retinopathy present  Has history of paresthesiae in her feet treated with gabapentin  Most recent foot exam: 2/22   LABS:  Lab on 05/06/2021  Component Date Value Ref Range Status   Microalb, Ur 05/06/2021 5.8 (A) 0.0 - 1.9 mg/dL Final   Creatinine,U 05/06/2021 66.7  mg/dL Final   Microalb Creat Ratio 05/06/2021 8.6  0.0 - 30.0  mg/g Final   Sodium 05/06/2021 135  135 - 145 mEq/L Final   Potassium 05/06/2021 4.1  3.5 - 5.1 mEq/L Final   Chloride 05/06/2021 100  96 - 112 mEq/L Final   CO2 05/06/2021 28  19 - 32 mEq/L Final   Glucose, Bld 05/06/2021 275 (A) 70 - 99 mg/dL Final   BUN 05/06/2021 10  6 - 23 mg/dL Final   Creatinine, Ser 05/06/2021 0.84  0.40 - 1.20 mg/dL Final   GFR 05/06/2021 86.03  >60.00 mL/min Final   Calculated using the CKD-EPI Creatinine Equation (2021)   Calcium 05/06/2021 9.7  8.4 - 10.5 mg/dL Final   Hgb A1c MFr Bld 05/06/2021 8.3  (A) 4.6 - 6.5 % Final   Glycemic Control Guidelines for People with Diabetes:Non Diabetic:  <6%Goal of Therapy: <7%Additional Action Suggested:  >8%     Physical Examination:  BP 132/90   Pulse 96   Ht 6' (1.829 m)   Wt (!) 411 lb 12.8 oz (186.8 kg)   SpO2 98%   BMI 55.85 kg/m        ASSESSMENT:  Diabetes type 2, with Morbid obesity   See history of present illness for detailed discussion of current diabetes management, blood sugar patterns and problems identified  Her A1c is now 8.1  Has been prescribed Xigduo after Invokana was denied by insurance  Again she has difficulty getting proper coverage of her medications from her insurance and is not taking her SGLT2 drugs this year regularly Xigduo apparently not being covered now by her insurance and she has not used it for a month  Her regimen of diet can be better with relatively high readings overnight and after lunch also, frequently going over 200 Not motivated to exercise again  She does need significant weight loss for long-term benefits and has not been able to achieve this  LIPIDS: LDL below 100 last but may consider statin drugs as cardiovascular risk reduction role, we will recheck on the next visit  Hypertension: Does appear to have high reading today and will need to follow-up with PCP Microalbumin normal   PLAN:   She will be tried on [facility] She can try 2.5 mg weekly with a sample  Discussed with the patient the nature of GLP-1 drugs, the action on various organ systems, improved satiety, how they benefit blood glucose control, as well as the benefit of weight loss.  Explained possible side effects of MOUNJARO, most commonly nausea that usually improves over time; discussed safety information in package insert.  Demonstrated the medication injection device and injection technique to the patient.  Showed patient possible injection sites To start with 2.5 mg dosage weekly for the first 4 injections and  then consider 5 mg but she will call for prescription  Patient brochure on Mounjaro with enclosed co-pay card given Since she has difficulty with tolerating metformin in the morning she can try taking both tablets in the evening, new prescription sent  Prescription for [facility] sent since she wants to monitor blood sugars with this instead of fingersticks and discussed how this would be useful as well as importance of checking 3-4 times a day at different intervals She can use the app on the phone, To call if she has any difficulty starting this  Hypertension: She will try to check blood pressure at home, needs follow-up with her PCP.  Also may have better readings with exercise and weight loss Also may be having relatively high readings with not taking  SGLT2 drugs  Total visit time including counseling = 20 minutes  Patient Instructions  Exercise daily  Mounjaro 2.72m weekly for 4 shots then call  Check blood sugars on waking up days a week  Also check blood sugars about 2 hours after meals and do this after different meals by rotation  Recommended blood sugar levels on waking up are 90-130 and about 2 hours after meal is 130-170  Please bring your blood sugar monitor to each visit, thank you       AElayne Snare7/27/2022, 2:57 PM   Note: This office note was prepared with Dragon voice recognition system technology. Any transcriptional errors that result from this process are unintentional.

## 2021-05-08 NOTE — Patient Instructions (Addendum)
Exercise daily  Mounjaro 2.'5mg'$  weekly for 4 shots then call  Check blood sugars on waking up days a week  Also check blood sugars about 2 hours after meals and do this after different meals by rotation  Recommended blood sugar levels on waking up are 90-130 and about 2 hours after meal is 130-170  Please bring your blood sugar monitor to each visit, thank you

## 2021-05-11 DIAGNOSIS — Z20822 Contact with and (suspected) exposure to covid-19: Secondary | ICD-10-CM | POA: Diagnosis not present

## 2021-05-14 ENCOUNTER — Telehealth: Payer: BC Managed Care – PPO | Admitting: Physician Assistant

## 2021-05-14 DIAGNOSIS — J209 Acute bronchitis, unspecified: Secondary | ICD-10-CM | POA: Diagnosis not present

## 2021-05-14 MED ORDER — AZITHROMYCIN 250 MG PO TABS: ORAL_TABLET | ORAL | 0 refills | Status: AC

## 2021-05-14 MED ORDER — BENZONATATE 100 MG PO CAPS: 100.0000 mg | ORAL_CAPSULE | Freq: Three times a day (TID) | ORAL | 0 refills | Status: DC | PRN

## 2021-05-14 MED ORDER — METFORMIN HCL ER 750 MG PO TB24: 1500.0000 mg | ORAL_TABLET | Freq: Every day | ORAL | 1 refills | Status: DC

## 2021-05-14 NOTE — Progress Notes (Addendum)

## 2021-05-14 NOTE — Addendum Note (Signed)
Addended by: Brunetta Jeans on: 05/14/2021 09:38 AM   Modules accepted: Orders

## 2021-05-14 NOTE — Progress Notes (Signed)
I have spent 5 minutes in review of e-visit questionnaire, review and updating patient chart, medical decision making and response to patient.   Siena Poehler Cody Marlene Beidler, PA-C    

## 2021-05-15 NOTE — Telephone Encounter (Signed)
Pt rx updated.

## 2021-05-27 ENCOUNTER — Telehealth: Payer: Self-pay | Admitting: Endocrinology

## 2021-05-27 NOTE — Telephone Encounter (Signed)
Pt is calling back to let Dr.Kumar know that she has took her third injection of the 2.5 mg dose and body has done good with it and she is ready to start the 5.'0mg'$  and to let patient know how to take the metformin. Patient would like a call back   [id number] - [place], [place] - [address] AT [address]

## 2021-05-28 ENCOUNTER — Other Ambulatory Visit: Payer: Self-pay

## 2021-05-28 DIAGNOSIS — E1165 Type 2 diabetes mellitus with hyperglycemia: Secondary | ICD-10-CM

## 2021-05-28 MED ORDER — METFORMIN HCL ER 750 MG PO TB24: 1500.0000 mg | ORAL_TABLET | Freq: Every day | ORAL | 3 refills | Status: DC

## 2021-05-28 MED ORDER — ONETOUCH VERIO VI STRP: ORAL_STRIP | 3 refills | Status: DC

## 2021-05-29 ENCOUNTER — Telehealth: Payer: Self-pay | Admitting: Pharmacy Technician

## 2021-05-29 ENCOUNTER — Other Ambulatory Visit: Payer: Self-pay | Admitting: Endocrinology

## 2021-05-29 MED ORDER — TIRZEPATIDE 5 MG/0.5ML ~~LOC~~ SOAJ: 5.0000 mg | SUBCUTANEOUS | 1 refills | Status: DC

## 2021-05-29 NOTE — Telephone Encounter (Signed)
I called patient to inform medication has been sent

## 2021-05-29 NOTE — Telephone Encounter (Addendum)
Patient Advocate Encounter   Received notification from [facility] that prior authorization for [facility] is required.   PA submitted on 05/29/2021 Key [id number] Status is APPROVED  [id number] Coverage Start Date:04/29/2021;Coverage End Date:05/29/2022;    [facility] will continue to follow   Ronney Asters, CPhT Patient Advocate [facility] Phone: [phone] Fax:  [phone]

## 2021-06-04 DIAGNOSIS — F4320 Adjustment disorder, unspecified: Secondary | ICD-10-CM | POA: Diagnosis not present

## 2021-06-11 ENCOUNTER — Ambulatory Visit: Payer: BC Managed Care – PPO | Admitting: Internal Medicine

## 2021-06-12 DIAGNOSIS — F4320 Adjustment disorder, unspecified: Secondary | ICD-10-CM | POA: Diagnosis not present

## 2021-06-21 DIAGNOSIS — F4320 Adjustment disorder, unspecified: Secondary | ICD-10-CM | POA: Diagnosis not present

## 2021-06-26 ENCOUNTER — Telehealth: Payer: Self-pay | Admitting: Endocrinology

## 2021-06-26 NOTE — Telephone Encounter (Signed)
Pt is calling in voiced that she has taken the 5.0 mounjaro and has taken the last dose . Pt is wondering if she needs to stay at the 5.0  or if it can be increased to 7.5.   [id number] - [place],  - [id number] AT [address]

## 2021-06-28 DIAGNOSIS — F4320 Adjustment disorder, unspecified: Secondary | ICD-10-CM | POA: Diagnosis not present

## 2021-07-04 NOTE — Telephone Encounter (Signed)
Pt states that she has already received the 5mg  mounjaro. Spoke with Angel Dawson and let him know and he is ok with her staying on that until she has her appt next week.

## 2021-07-05 DIAGNOSIS — F4320 Adjustment disorder, unspecified: Secondary | ICD-10-CM | POA: Diagnosis not present

## 2021-07-08 ENCOUNTER — Other Ambulatory Visit: Payer: BC Managed Care – PPO

## 2021-07-09 ENCOUNTER — Other Ambulatory Visit (INDEPENDENT_AMBULATORY_CARE_PROVIDER_SITE_OTHER): Payer: BC Managed Care – PPO

## 2021-07-09 DIAGNOSIS — E7800 Pure hypercholesterolemia, unspecified: Secondary | ICD-10-CM | POA: Diagnosis not present

## 2021-07-09 DIAGNOSIS — E1165 Type 2 diabetes mellitus with hyperglycemia: Secondary | ICD-10-CM

## 2021-07-09 LAB — LIPID PANEL
Cholesterol: 166 mg/dL (ref 0–200)
HDL: 38.2 mg/dL — ABNORMAL LOW (ref >39.00)
LDL Cholesterol: 90 mg/dL (ref 0–99)
NonHDL: 128.07
Total CHOL/HDL Ratio: 4
Triglycerides: 192 mg/dL — ABNORMAL HIGH (ref 0.0–149.0)
VLDL: 38.4 mg/dL (ref 0.0–40.0)

## 2021-07-09 LAB — BASIC METABOLIC PANEL
BUN: 11 mg/dL (ref 6–23)
CO2: 26 mEq/L (ref 19–32)
Calcium: 10.6 mg/dL — ABNORMAL HIGH (ref 8.4–10.5)
Chloride: 102 mEq/L (ref 96–112)
Creatinine, Ser: 0.83 mg/dL (ref 0.40–1.20)
GFR: 87.17 mL/min (ref >60.00)
Glucose, Bld: 118 mg/dL — ABNORMAL HIGH (ref 70–99)
Potassium: 4 mEq/L (ref 3.5–5.1)
Sodium: 135 mEq/L (ref 135–145)

## 2021-07-10 LAB — FRUCTOSAMINE: Fructosamine: 266 umol/L (ref 0–285)

## 2021-07-11 ENCOUNTER — Ambulatory Visit: Payer: BC Managed Care – PPO | Admitting: Endocrinology

## 2021-07-11 ENCOUNTER — Other Ambulatory Visit: Payer: Self-pay

## 2021-07-11 VITALS — BP 140/82 | HR 107 | Ht 71.0 in | Wt >= 6400 oz

## 2021-07-11 DIAGNOSIS — E1165 Type 2 diabetes mellitus with hyperglycemia: Secondary | ICD-10-CM

## 2021-07-11 DIAGNOSIS — Z23 Encounter for immunization: Secondary | ICD-10-CM

## 2021-07-11 DIAGNOSIS — E782 Mixed hyperlipidemia: Secondary | ICD-10-CM

## 2021-07-11 MED ORDER — TIRZEPATIDE 7.5 MG/0.5ML ~~LOC~~ SOAJ: 7.5000 mg | SUBCUTANEOUS | 3 refills | Status: DC

## 2021-07-11 NOTE — Progress Notes (Signed)
Patient ID: Angel Dawson, female   DOB: 08/23/1981, 40 y.o.   MRN: [id number]          Reason for Appointment: Follow-up Type 2 Diabetes  Referring physician: [name] [name]    History of Present Illness:          Date of diagnosis of type 2 diabetes mellitus:  2015      Background history:   At the time of diagnosis and 2015 she had extreme thirst and urination and was admitted to the hospital with blood sugars over 400 but no overt acidosis However she was discharged only on metformin 500 mg twice a day which she has continued Prior to that in 2014 she appears to have had mild hyperglycemia with blood sugars up to 195  Recent history:    Non-insulin hypoglycemic drugs the patient is taking are: Metformin Er 750 mg, 2 tablets daily, Mounjaro 5 mg weekly  Her A1c is on the last visit up to 8.3, has been low as 6.5 Fructosamine is 266   Current management, blood sugar patterns and problems identified: She previously had high blood sugars related to having difficulty getting insurance coverage for her Xigduo  With starting [employer] about 2 months ago she has had better readings  She took 2.5 mg for 4 weeks and has taken 5 mg for about 6 weeks now  She has had no nausea or GI disturbance  It does help her control her portions better and reduce her appetite  Recently is trying to drink only a low carbohydrate smoothie in the morning  She does try to continue taking metformin up to 2 a day with her evening meal  Also has started using the freestyle libre for blood sugar monitoring With this her blood sugars are recently averaging 133 for the last 2 weeks  Also with this her GMI is 6.5 only  She has not done much regular exercise Currently has not lost any weight  Side effects from medications have been: Diarrhea from high dose metformin  Glucose monitoring:  done less than 1 times a day         Glucometer: Freestyle libre  Interpretation the freestyle libre version 2 for  the last 2 weeks and data as follows  Blood sugars are showing very little variability with GV only 16% and fairly uniform readings throughout the day Her Elenor Legato appears to be fairly accurate with glucose 118 on the lab at the same time as her libre reading of 117 There is some variability in her blood sugars after meals but blood sugars overnight are relatively better Blood sugars are fairly stable overnight with average about 116-130 with slight rise early morning POSTPRANDIAL readings are averaging below 160 although occasionally will have readings up to 200 after any meal No hypoglycemia  PRE-MEAL Fasting Lunch Dinner Bedtime Overall  Glucose range:       Mean/median: 129    126   POST-MEAL PC Breakfast PC Lunch PC Dinner  Glucose range:     Mean/median: 151 137 143   Previous readings:  PRE-MEAL Morning Midday Dinner Overnight Overall  Glucose range:     154-266  Mean/median: 216 176 222 188 209    Blood sugar patterns: Checking blood sugars mostly at various hours of the mornings and generally will have higher readings after lunch if checked   Self-care:  Breakfast grits, bacon, sometimes omelette              Dietician visit, most  recent: 05/2017, previously in classes                Weight history:  Wt Readings from Last 3 Encounters:  07/11/21 (!) 410 lb 12.8 oz (186.3 kg)  05/08/21 (!) 411 lb 12.8 oz (186.8 kg)  02/20/21 (!) 411 lb (186.4 kg)    Glycemic control:    Lab Results  Component Value Date   HGBA1C 8.3 (H) 05/06/2021   HGBA1C 6.8 (H) 11/07/2020   HGBA1C 6.5 02/17/2020   Lab Results  Component Value Date   MICROALBUR 5.8 (H) 05/06/2021   LDLCALC 90 07/09/2021   CREATININE 0.83 07/09/2021   Lab Results  Component Value Date   MICRALBCREAT 8.6 05/06/2021    Lab Results  Component Value Date   FRUCTOSAMINE 266 07/09/2021   FRUCTOSAMINE 269 12/27/2018   FRUCTOSAMINE 258 06/12/2017   Appointment on 07/09/2021  Component Date Value Ref  Range Status   Cholesterol 07/09/2021 166  0 - 200 mg/dL Final   ATP III Classification       Desirable:  < 200 mg/dL               Borderline High:  200 - 239 mg/dL          High:  > = 240 mg/dL   Triglycerides 07/09/2021 192.0 (A) 0.0 - 149.0 mg/dL Final   Normal:  <150 mg/dLBorderline High:  150 - 199 mg/dL   HDL 07/09/2021 38.20 (A) >39.00 mg/dL Final   VLDL 07/09/2021 38.4  0.0 - 40.0 mg/dL Final   LDL Cholesterol 07/09/2021 90  0 - 99 mg/dL Final   Total CHOL/HDL Ratio 07/09/2021 4   Final                  Men          Women1/2 Average Risk     3.4          3.3Average Risk          5.0          4.42X Average Risk          9.6          7.13X Average Risk          15.0          11.0                       NonHDL 07/09/2021 128.07   Final   NOTE:  Non-HDL goal should be 30 mg/dL higher than patient's LDL goal (i.e. LDL goal of < 70 mg/dL, would have non-HDL goal of < 100 mg/dL)   Sodium 07/09/2021 135  135 - 145 mEq/L Final   Potassium 07/09/2021 4.0  3.5 - 5.1 mEq/L Final   Chloride 07/09/2021 102  96 - 112 mEq/L Final   CO2 07/09/2021 26  19 - 32 mEq/L Final   Glucose, Bld 07/09/2021 118 (A) 70 - 99 mg/dL Final   BUN 07/09/2021 11  6 - 23 mg/dL Final   Creatinine, Ser 07/09/2021 0.83  0.40 - 1.20 mg/dL Final   GFR 07/09/2021 87.17  >60.00 mL/min Final   Calculated using the CKD-EPI Creatinine Equation (2021)   Calcium 07/09/2021 10.6 (A) 8.4 - 10.5 mg/dL Final   Fructosamine 07/09/2021 266  0 - 285 umol/L Final   Comment: Published reference interval for apparently healthy subjects between age 25 and 46 is 29 - 285 umol/L and in a poorly controlled diabetic  population is 228 - 563 umol/L with a mean of 396 umol/L.       Allergies as of 07/11/2021   No Known Allergies      Medication List        Accurate as of July 11, 2021  8:52 PM. If you have any questions, ask your nurse or doctor.          STOP taking these medications    tirzepatide 5 MG/0.5ML  Pen Commonly known as: MOUNJARO Replaced by: tirzepatide 7.5 MG/0.5ML Pen Stopped by: Elayne Snare, MD       TAKE these medications    Alpha-Lipoic Acid 300 MG Caps Take by mouth.   amLODipine 5 MG tablet Commonly known as: NORVASC Take 1 tablet (5 mg total) by mouth daily.   aspirin-acetaminophen-caffeine 250-250-65 MG tablet Commonly known as: EXCEDRIN MIGRAINE Take 2 tablets by mouth every 8 (eight) hours as needed for headache.   BENFOTIAMINE PO Take by mouth.   benzonatate 100 MG capsule Commonly known as: TESSALON Take 1 capsule (100 mg total) by mouth 3 (three) times daily as needed for cough.   blood glucose meter kit and supplies Dispense based on patient and insurance preference. Use up to four times daily as directed. (FOR ICD-9 250.00, 250.01).   famotidine 10 MG tablet Commonly known as: PEPCID Take 20 mg by mouth daily as needed for heartburn or indigestion.   FreeStyle Libre 2 Sensor Misc 2 Devices by Does not apply route every 14 (fourteen) days.   gabapentin 300 MG capsule Commonly known as: NEURONTIN Take 1 capsule by mouth once daily at bedtime. **needs appt for further refills.** What changed:  how much to take how to take this when to take this reasons to take this additional instructions   lisinopril 10 MG tablet Commonly known as: ZESTRIL Take 1 tablet (10 mg total) by mouth daily. Use as needed for elevated BP   lisinopril-hydrochlorothiazide 20-25 MG tablet Commonly known as: ZESTORETIC Take 1 tablet by mouth daily.   metFORMIN 750 MG 24 hr tablet Commonly known as: Glucophage XR Take 2 tablets (1,500 mg total) by mouth daily with breakfast.   multivitamin with minerals tablet Take 1 tablet by mouth daily. With Diabetic Support   OneTouch Delica Lancets 16X Misc Use once daily to check glucose.   OneTouch Verio test strip Generic drug: glucose blood USE TO TEST BLOOD SUGAR ONCE DAILY   PROBIOTIC-10 PO Take 1 capsule by  mouth daily. Takes Fortify Women's Digestive Probiotic   tirzepatide 7.5 MG/0.5ML Pen Commonly known as: MOUNJARO Inject 7.5 mg into the skin once a week. Replaces: tirzepatide 5 MG/0.5ML Pen Started by: Elayne Snare, MD   Vitamin D3 1.25 MG (50000 UT) Caps Take 1 weekly for 12 weeks        Allergies: No Known Allergies  Past Medical History:  Diagnosis Date   Abdominal pain    related to gallstones   Achilles tendonitis, bilateral    Bronchitis    02/17   Diabetes ([place])    Type II   DKA (diabetic ketoacidoses) 08/25/2014   Gallstones    GERD (gastroesophageal reflux disease)    Headache    migraines   Hypertension    Lattice degeneration, both eyes    Pancreatitis    PCOS (polycystic ovarian syndrome)    Sinus congestion    occ. uses OTC meds for this    Past Surgical History:  Procedure Laterality Date   CHOLECYSTECTOMY N/A 06/22/2013  Procedure: LAPAROSCOPIC CHOLECYSTECTOMY ;  Surgeon: Adin Hector, MD;  Location: [facility];  Service: General;  Laterality: N/A;   DILITATION & CURRETTAGE/HYSTROSCOPY WITH HYDROTHERMAL ABLATION N/A 01/03/2016   Procedure: DILATATION & CURETTAGE/HYSTEROSCOPY WITH HYDROTHERMAL ABLATION;  Surgeon: Crawford Givens, MD;  Location: [facility];  Service: Gynecology;  Laterality: N/A;  HTA rep will be attend confirmed by office 12/12/15    NASAL SEPTOPLASTY W/ TURBINOPLASTY Bilateral 01/11/2021   Procedure: NASAL SEPTOPLASTY WITH TURBINATE REDUCTION;  Surgeon: Jerrell Belfast, MD;  Location: [facility];  Service: ENT;  Laterality: Bilateral;   TONSILLECTOMY      Family History  Problem Relation Age of Onset   Hypertension Mother    Diabetes Mother    Goiter Mother    Hypertension Father    Diabetes Father    Asthma Father    Stroke Paternal Grandfather    Lung cancer Paternal Grandmother        smoker-heavy   Diabetes Maternal Aunt    Colon cancer Neg Hx    Heart disease Neg Hx     Social History:  reports that she has never smoked. She  has never used smokeless tobacco. She reports that she does not currently use alcohol. She reports that she does not use drugs.   Review of Systems   Lipid history: Has not been on any statin drugs  with variable results LDL is below 100 but triglycerides are still relatively high    Lab Results  Component Value Date   CHOL 166 07/09/2021   CHOL 173 11/07/2020   CHOL 184 03/11/2019   Lab Results  Component Value Date   HDL 38.20 (L) 07/09/2021   HDL 39.20 11/07/2020   HDL 41.70 03/11/2019   Lab Results  Component Value Date   LDLCALC 90 07/09/2021   LDLCALC 98 11/07/2020   LDLCALC 106 (H) 03/11/2019   Lab Results  Component Value Date   TRIG 192.0 (H) 07/09/2021   TRIG 179.0 (H) 11/07/2020   TRIG 183.0 (H) 03/11/2019   Lab Results  Component Value Date   CHOLHDL 4 07/09/2021   CHOLHDL 4 11/07/2020   CHOLHDL 4 03/11/2019   Lab Results  Component Value Date   LDLDIRECT 87.0 05/16/2019   LDLDIRECT 87.5 09/22/2014            Hypertension: Present for the last 10 years, monitored by PCP  This is followed by her PCP  She is taking amlodipine and lisinopril HCTZ     BP Readings from Last 3 Encounters:  07/11/21 140/82  05/08/21 132/90  02/20/21 124/88   Has a high calcium which is not consistent  Lab Results  Component Value Date   CALCIUM 10.6 (H) 07/09/2021   CALCIUM 9.7 05/06/2021   CALCIUM 10.2 01/09/2021    Most recent eye exam was 1/22, previously followed by a retina specialist but no diabetic retinopathy present  Has history of paresthesiae in her feet treated with gabapentin, symptoms are infrequent However she is asking about her feet feeling a little colder now without any numbness  Most recent foot exam: 2/22  Reportedly has had history of pancreatitis although no definite episode for some time  LABS:  Appointment on 07/09/2021  Component Date Value Ref Range Status   Cholesterol 07/09/2021 166  0 - 200 mg/dL Final   ATP III  Classification       Desirable:  < 200 mg/dL               Borderline  High:  200 - 239 mg/dL          High:  > = 240 mg/dL   Triglycerides 07/09/2021 192.0 (A) 0.0 - 149.0 mg/dL Final   Normal:  <150 mg/dLBorderline High:  150 - 199 mg/dL   HDL 07/09/2021 38.20 (A) >39.00 mg/dL Final   VLDL 07/09/2021 38.4  0.0 - 40.0 mg/dL Final   LDL Cholesterol 07/09/2021 90  0 - 99 mg/dL Final   Total CHOL/HDL Ratio 07/09/2021 4   Final                  Men          Women1/2 Average Risk     3.4          3.3Average Risk          5.0          4.42X Average Risk          9.6          7.13X Average Risk          15.0          11.0                       NonHDL 07/09/2021 128.07   Final   NOTE:  Non-HDL goal should be 30 mg/dL higher than patient's LDL goal (i.e. LDL goal of < 70 mg/dL, would have non-HDL goal of < 100 mg/dL)   Sodium 07/09/2021 135  135 - 145 mEq/L Final   Potassium 07/09/2021 4.0  3.5 - 5.1 mEq/L Final   Chloride 07/09/2021 102  96 - 112 mEq/L Final   CO2 07/09/2021 26  19 - 32 mEq/L Final   Glucose, Bld 07/09/2021 118 (A) 70 - 99 mg/dL Final   BUN 07/09/2021 11  6 - 23 mg/dL Final   Creatinine, Ser 07/09/2021 0.83  0.40 - 1.20 mg/dL Final   GFR 07/09/2021 87.17  >60.00 mL/min Final   Calculated using the CKD-EPI Creatinine Equation (2021)   Calcium 07/09/2021 10.6 (A) 8.4 - 10.5 mg/dL Final   Fructosamine 07/09/2021 266  0 - 285 umol/L Final   Comment: Published reference interval for apparently healthy subjects between age 30 and 3 is 70 - 285 umol/L and in a poorly controlled diabetic population is 228 - 563 umol/L with a mean of 396 umol/L.     Physical Examination:  BP 140/82   Pulse (!) 107   Ht _0  (1.803 m)   Wt (!) 410 lb 12.8 oz (186.3 kg)   SpO2 97%   BMI 57.29 kg/m        ASSESSMENT:  Diabetes type 2, with Morbid obesity   See history of present illness for detailed discussion of current diabetes management, blood sugar patterns and problems  identified  Her A1c is last 8.1  Current regimen is metformin and Mounjaro 5 mg Fructosamine is improved at 266  Her blood sugars are very significantly improved with adding Mounjaro which she has taken for about 2 months, recently 5 mg without side effects  Her blood sugars are still only occasionally going up postprandially based on her carbohydrate intake and morning readings are averaging about 130 Currently has not had any weight loss possibly because of improved blood sugars However she is not exercising also  LIPIDS: LDL below 100 but triglycerides are still relatively high, may improve with weight loss  Hypertension: Blood pressure is under fair control  Mild  increase in calcium: This is new and will need to continue to follow  Mild neuropathy: Not clear if her symptoms of coldness in her feet are related to neuropathy  PLAN:   She will go up to 7.5 mg of Mounjaro when she finishes her next 2 injections of 5 mg Report any episode of GI side effects or abdominal pain  Since she still has a tendency to diarrhea with metformin she can leave it off unless fasting blood sugars start going up Start walking for exercise up to 5 days a week at least Continue using freestyle libre   Dyslipidemia: Consider statin drug on the next visit which will help with cardiovascular risk reduction as well as high triglycerides if this is persistent  Influenza vaccine given  Patient Instructions  Exercise daily  Stop Metformin  Restart 1 if am stays >140       Elayne Snare 07/11/2021, 8:52 PM   Note: This office note was prepared with Dragon voice recognition system technology. Any transcriptional errors that result from this process are unintentional.

## 2021-07-11 NOTE — Patient Instructions (Signed)
Exercise daily  Stop Metformin  Restart 1 if am stays >140

## 2021-07-12 DIAGNOSIS — F4320 Adjustment disorder, unspecified: Secondary | ICD-10-CM | POA: Diagnosis not present

## 2021-07-19 DIAGNOSIS — F4320 Adjustment disorder, unspecified: Secondary | ICD-10-CM | POA: Diagnosis not present

## 2021-07-24 DIAGNOSIS — F4320 Adjustment disorder, unspecified: Secondary | ICD-10-CM | POA: Diagnosis not present

## 2021-08-08 DIAGNOSIS — F4320 Adjustment disorder, unspecified: Secondary | ICD-10-CM | POA: Diagnosis not present

## 2021-08-20 DIAGNOSIS — F4320 Adjustment disorder, unspecified: Secondary | ICD-10-CM | POA: Diagnosis not present

## 2021-08-25 NOTE — Progress Notes (Addendum)
Imaging was near [facility] at [facility] [address], [place], [place] [address] [phone]  Date:  08/29/2021   Name:  FLONNIE Dawson   DOB:  [birth year]   MRN:  [id number]  PCP:  Darreld Mclean, MD    Chief Complaint: 6 month follow up (Concerns/ questions: 1. Ringing in the ears. 2. Back pain. 3. Lisinopril and its correlation to allergic reactions in african americans./Flu shot today:received 07/11/21/)   History of Present Illness:  Angel Dawson is a 40 y.o. very pleasant female patient who presents with the following:  6 month visit today Last seen by myself in May - history of DM, OSA, HTN, obesity, gallstone pancreatitis s/p cholecystectomy  Vitamin D low in May- can recheck for her if she likes   Visit with Dr Dwyane Dee for her DM in September: PLAN:   She will go up to 7.5 mg of Mounjaro when she finishes her next 2 injections of 5 mg Report any episode of GI side effects or abdominal pain Since she still has a tendency to diarrhea with metformin she can leave it off unless fasting blood sugars start going up Start walking for exercise up to 5 days a week at least Continue using freestyle libre Dyslipidemia: Consider statin drug on the next visit which will help with cardiovascular risk reduction as well as high triglycerides if this is persistent  Flu shot done  Pneumonia vaccine-she would like to have Prevnar 20 today Covid booster -already completed  She is using [facility]-  endocrinology stopped her metformin due to her glucose being under such good control She is feeling better- her GI sx are resolved since she was able to stop metformin.  Overall she is very happy with her current treatment  She mentions concern about possible increased risk of allergy to lisinopril and African-American persons.  We discussed this today.  Certainly angioedema can occur with this medication.  However, per my understanding  benefits still outweigh risk unless of course she should have symptoms of angioedema.   Wt Readings from Last 3 Encounters:  08/29/21 (!) 411 lb 3.2 oz (186.5 kg)  08/27/21 (!) 414 lb 1.6 oz (187.8 kg)  07/11/21 (!) 410 lb 12.8 oz (186.3 kg)   She uses gabapentin as needed for back pain and sciatica  Her back is getting worse over the last few months- she has noted pain in her back for the last 10 years or so since she had a fall She is using gabapentin 300 once a day and also advil as needed Never had any lumbar films  Pain goes down her left leg but not on the right leg.  No numbness, but she may get some tingling on occasion.  Patient Active Problem List   Diagnosis Date Noted   Deviated septum 01/11/2021   Obstructive sleep apnea treated with continuous positive airway pressure (CPAP) 12/29/2017   Onychomycosis 10/26/2014   Gastroesophageal reflux disease without esophagitis 09/28/2014   Diabetes mellitus type II, uncontrolled 09/28/2014   Hypertension 08/25/2014   Morbid obesity ([place]) 08/25/2014   Chronic rhinitis 06/30/2014   Pancreatitis, acute 07/02/2013   Gallstones 04/13/2013    Past Medical History:  Diagnosis Date   Abdominal pain    related to gallstones   Achilles tendonitis, bilateral    Bronchitis    02/17   Diabetes ([place])    Type II   DKA (diabetic ketoacidoses) 08/25/2014   Gallstones  GERD (gastroesophageal reflux disease)    Headache    migraines   Hypertension    Lattice degeneration, both eyes    Pancreatitis    PCOS (polycystic ovarian syndrome)    Sinus congestion    occ. uses OTC meds for this    Past Surgical History:  Procedure Laterality Date   CHOLECYSTECTOMY N/A 06/22/2013   Procedure: LAPAROSCOPIC CHOLECYSTECTOMY ;  Surgeon: Adin Hector, MD;  Location: [facility];  Service: General;  Laterality: N/A;   DILITATION & CURRETTAGE/HYSTROSCOPY WITH HYDROTHERMAL ABLATION N/A 01/03/2016   Procedure: DILATATION & CURETTAGE/HYSTEROSCOPY  WITH HYDROTHERMAL ABLATION;  Surgeon: Crawford Givens, MD;  Location: [facility];  Service: Gynecology;  Laterality: N/A;  HTA rep will be attend confirmed by office 12/12/15    NASAL SEPTOPLASTY W/ TURBINOPLASTY Bilateral 01/11/2021   Procedure: NASAL SEPTOPLASTY WITH TURBINATE REDUCTION;  Surgeon: Jerrell Belfast, MD;  Location: [facility];  Service: ENT;  Laterality: Bilateral;   TONSILLECTOMY      Social History   Tobacco Use   Smoking status: Never   Smokeless tobacco: Never  Vaping Use   Vaping Use: Never used  Substance Use Topics   Alcohol use: Not Currently    Alcohol/week: 0.0 standard drinks    Comment: 1 drink per month   Drug use: No    Family History  Problem Relation Age of Onset   Hypertension Mother    Diabetes Mother    Goiter Mother    Sleep apnea Mother    Hypertension Father    Diabetes Father    Asthma Father    Diabetes Maternal Aunt    Lung cancer Paternal Grandmother        smoker-heavy   Stroke Paternal Grandfather    Colon cancer Neg Hx    Heart disease Neg Hx     No Known Allergies  Medication list has been reviewed and updated.  Current Outpatient Medications on File Prior to Visit  Medication Sig Dispense Refill   Alpha-Lipoic Acid 300 MG CAPS Take by mouth.     amLODipine (NORVASC) 5 MG tablet Take 1 tablet (5 mg total) by mouth daily. 90 tablet 3   BENFOTIAMINE PO Take by mouth.     benzonatate (TESSALON) 100 MG capsule Take 1 capsule (100 mg total) by mouth 3 (three) times daily as needed for cough. 30 capsule 0   Blood Glucose Monitoring Suppl (BLOOD GLUCOSE METER KIT AND SUPPLIES) Dispense based on patient and insurance preference. Use up to four times daily as directed. (FOR ICD-9 250.00, 250.01). 1 each 0   famotidine (PEPCID) 10 MG tablet Take 20 mg by mouth daily as needed for heartburn or indigestion.     gabapentin (NEURONTIN) 300 MG capsule Take 1 capsule by mouth once daily at bedtime. **needs appt for further refills.** (Patient taking  differently: Take 300 mg by mouth at bedtime as needed (pain). Take 1 capsule by mouth once daily at bedtime as needed **needs appt for further refills.**) 90 capsule 0   glucose blood (ONETOUCH VERIO) test strip USE TO TEST BLOOD SUGAR ONCE DAILY 100 strip 3   lisinopril (ZESTRIL) 10 MG tablet Take 1 tablet (10 mg total) by mouth daily. Use as needed for elevated BP 90 tablet 1   lisinopril-hydrochlorothiazide (ZESTORETIC) 20-25 MG tablet Take 1 tablet by mouth daily. 90 tablet 3   Multiple Vitamins-Minerals (MULTIVITAMIN WITH MINERALS) tablet Take 1 tablet by mouth daily. With Diabetic Support     OneTouch Delica Lancets 56L MISC Use  once daily to check glucose. 100 each 1   Probiotic Product (PROBIOTIC-10 PO) Take 1 capsule by mouth daily. Takes Fortify Women's Digestive Probiotic     tirzepatide (MOUNJARO) 7.5 MG/0.5ML Pen Inject 7.5 mg into the skin once a week. 2 mL 3   Continuous Blood Gluc Sensor (FREESTYLE LIBRE 2 SENSOR) MISC APPLY EVERY 14 DAYS 2 each 3   No current facility-administered medications on file prior to visit.    Review of Systems:  As per HPI- otherwise negative.   Physical Examination: Vitals:   08/29/21 0832 08/29/21 0920  BP: 132/80   Pulse: (!) 111 96  Resp: 18   Temp: 98.1 F (36.7 C)   SpO2: 99%    Vitals:   08/29/21 0832  Weight: (!) 411 lb 3.2 oz (186.5 kg)  Height: 5' 11" (1.803 m)   Body mass index is 57.35 kg/m. Ideal Body Weight: Weight in (lb) to have BMI = 25: 178.9   Pulse Readings from Last 3 Encounters:  08/29/21 96  08/27/21 98  07/11/21 (!) 107    GEN: no acute distress.  Obese, tall build Looks well, no HEENT: Atraumatic, Normocephalic.  Ears and Nose: No external deformity. CV: RRR, No M/G/R. No JVD. No thrill. No extra heart sounds. PULM: CTA B, no wheezes, crackles, rhonchi. No retractions. No resp. distress. No accessory muscle use. ABD: S, NT, ND, +BS. No rebound. No HSM. EXTR: No c/c/e PSYCH: Normally interactive.  Conversant.  This Patient has tenderness and spasm in the left-sided lumbar paraspinous muscles, into the left buttock. Normal thoracolumbar flexion and extension.  Normal bilateral lower extremity strength and sensation, negative straight leg raise bilaterally   Assessment and Plan: Controlled type 2 diabetes mellitus with other specified complication, without long-term current use of insulin (HCC)  Vitamin D deficiency - Plan: VITAMIN D 25 Hydroxy (Vit-D Deficiency, Fractures)  Essential hypertension, benign  Back pain of lumbar region with sciatica - Plan: DG Lumbar Spine Complete  Immunization due - Plan: Pneumococcal conjugate vaccine 20-valent (Prevnar 20)  Dysuria - Plan: Urine Culture, Microalbumin / creatinine urine ratio  Patient is seen today with a couple of concerns.  Blood pressures under good control, follow-up on vitamin D deficiency today  She has noted some longer-term urinary symptoms-she will feel like she has to push harder to get her urine out.  We will obtain a urine culture to rule out infection, can have her see urology if she would like  Long history of back pain, so far she has not had further evaluation.  We will obtain lumbar films today.  I suggested massage, foam roller, perhaps a massage gun for home use  Will plan further follow- up pending labs.   Signed Lamar Blinks, MD  Received labs as below, message to pt  Results for orders placed or performed in visit on 08/29/21  Urine Culture   Specimen: Blood  Result Value Ref Range   MICRO NUMBER: [id number]    SPECIMEN QUALITY: Adequate    Sample Source NOT GIVEN    STATUS: FINAL    ISOLATE 1:      Less than 10,000 CFU/mL of single Gram positive organism isolated. No further testing will be performed. If clinically indicated, recollection using a method to minimize contamination, with prompt transfer to Urine Culture Transport Tube, is recommended.  VITAMIN D 25 Hydroxy (Vit-D Deficiency,  Fractures)  Result Value Ref Range   VITD 25.27 (L) 30.00 - 100.00 ng/mL  Microalbumin / creatinine urine ratio  Result Value Ref Range   Microalb, Ur 4.1 (H) 0.0 - 1.9 mg/dL   Creatinine,U 87.8 mg/dL   Microalb Creat Ratio 4.7 0.0 - 30.0 mg/g   Addnd 11/19- received her urine culture and lumbar films report.  Message to pt DG Lumbar Spine Complete  Result Date: 08/30/2021 CLINICAL DATA:  Low back pain for several years, no known injury, initial encounter EXAM: LUMBAR SPINE - COMPLETE 4+ VIEW COMPARISON:  None. FINDINGS: Five lumbar type vertebral bodies are well visualized. Vertebral body height is well maintained. Changes of prior cholecystectomy are noted. No pars defects are seen. No anterolisthesis is noted. No soft tissue abnormality is seen. IMPRESSION: No acute abnormality noted. Electronically Signed   By: Inez Catalina M.D.   On: 08/30/2021 21:48

## 2021-08-25 NOTE — Patient Instructions (Addendum)
Good to see you today!   Please get your updated covid booster if not done already- done!   Pneumonia vaccine today   We will get your vitamin D level today and a urine culture / protein  Please come in for your lumbar spine films at your convenience

## 2021-08-27 ENCOUNTER — Ambulatory Visit (INDEPENDENT_AMBULATORY_CARE_PROVIDER_SITE_OTHER): Payer: BC Managed Care – PPO | Admitting: Neurology

## 2021-08-27 ENCOUNTER — Encounter: Payer: Self-pay | Admitting: Neurology

## 2021-08-27 ENCOUNTER — Other Ambulatory Visit: Payer: Self-pay

## 2021-08-27 VITALS — BP 152/98 | HR 98 | Ht 71.0 in | Wt >= 6400 oz

## 2021-08-27 DIAGNOSIS — Z9889 Other specified postprocedural states: Secondary | ICD-10-CM

## 2021-08-27 DIAGNOSIS — G4719 Other hypersomnia: Secondary | ICD-10-CM

## 2021-08-27 DIAGNOSIS — G4733 Obstructive sleep apnea (adult) (pediatric): Secondary | ICD-10-CM

## 2021-08-27 DIAGNOSIS — Z6841 Body Mass Index (BMI) 40.0 and over, adult: Secondary | ICD-10-CM

## 2021-08-27 NOTE — Progress Notes (Signed)
Subjective:    Patient ID: Angel Dawson is a 40 y.o. female.  HPI    History:    Dear Dr. Wilburn Cornelia,  I saw your patient, Angel Dawson, upon your kind request in my sleep clinic today for evaluation of her obstructive sleep apnea.  The patient is unaccompanied today.  As you know, Angel Dawson is a very pleasant 40 year old right-handed woman with an underlying medical history of bronchitis, diabetes, cholelithiasis, hypertension, pancreatitis, PCOS, reflux disease, and severe obesity with a BMI of over 50, who was previously diagnosed with obstructive sleep apnea and placed on CPAP therapy.  I had evaluated her for sleep apnea in 2018 at the request of her primary care.  She had a baseline sleep study on 07/01/2017 which showed a total AHI of 12.6/h, REM AHI of 25.4/h, supine AHI of 14.1/h, O2 nadir 86%.  She had a subsequent titration study on 08/17/2017, at which time she did well with CPAP of 7 cm.  She was started on home CPAP therapy.  I reviewed your office note from 06/10/2021.  She had inferior turbinate reduction as well as septoplasty on 01/11/2021.  She reports that she has done rather well after her surgery, she reports that her breathing is improved significantly, she feels that she does not snore any longer, friends used to tell her that she made abnormal sounds like a sigh during sleep and she does not do that any longer.  She lives alone, she is single, no kids, no pets in the house, she works as a Passenger transport manager for [employer].  She does not have to go to [facility] every day and typically commutes 2 or 3 days out of the week.  She is trying to reduce her caffeine intake.  She generally drinks 1 energy drink per day. Her Epworth sleepiness score is 14 out of 24, fatigue severity score is 49 out of 63.  She does endorse daytime tiredness.  She overall feels that she is sleeping better.  She is generally in bed around 930, as late as 10:30 PM rise time is 5:45 AM if she has to drive to  [place].  She has been started on mounjaro some 3 months ago and is followed by endocrinology for her diabetes.  Her weight has fluctuated.  She would like to be re-evaluated for sleep apnea. In the past year, she has used her CPAP only about 10 days, she did try using it most recently in August for a few days.  She feels that the area is quite strong and her breathing overall is better, she feels like she is sleeping better at night.  She denies recurrent morning headaches or night to night nocturia.  She saw Angel Rubin, NP, on 12/17/2017, at which time she was slightly suboptimal with her CPAP therapy.  She was advised to follow-up in about 3 months.  She has not been seen in this clinic since then.  Previously:   05/26/17: 40 year old right-handed woman with an underlying medical history of diabetes, history of diabetic ketoacidosis, history of bronchitis last year, reflux disease, hypertension, recurrent headache, PCO S, history of pancreatitis, and morbid obesity, who had a home sleep test about 2-1/2 years ago which showed borderline obstructive sleep apnea with an AHI of 4.4 per hour, average oxygen saturation of 94%, nadir of 83%. Her BMI was 57.6 at the time, weight was 413 pounds. Her current weight is about the same, 412 lb. I reviewed your office note from 04/08/2017. She  also reports having had an actual attended sleep study some 10 years ago with inconclusive findings as she was not able to sleep very much. This was done at [facility], she was not able to sleep on her back at the time and remembers that she did not sleep and off. She does sleep mostly on her sides and sometimes on her stomach. She is able to sleep on her back some. She lives alone, she is single, no children, works at [employer] and has a long commute. She has to drive for about an hour and she lives in [place]. She gets sleepy at the wheel. This has concerned her. She has recently shared a bedroom with a girlfriend of hers and  has specifically asked her about snoring and she does not snore very much, makes quiet noises sometimes like a sigh. She has vivid dreams. She dreams during naps as well. She has extended naps sometimes on weekends, has slept for hours at a time. She has experienced intermittent sleep paralysis for years, dating back 20 years even. She recalls being sleepy as a child even. She had her tonsils out when she was 30 and had multiple strep throat episodes with time. She is trying to lose weight and make some lifestyle changes to help her diabetes and her weight. She has given up caffeine. She drinks alcohol very occasionally, maybe once a month or so. She's a nonsmoker. Bedtime is around 10 PM and wakeup time typically without an alarm is around 5 or 5:15 AM but by 11 AM she becomes really sleepy and often will try to nap during her lunch hour. She has to be at work at 23. She leaves the house around 5:45 AM. She has had intermittent restless leg symptoms. She has had leg cramps at night as well. She has woken up with a headache particularly after an extended nap, not so much in the morning, denies nocturia. Mom has sleep apnea, dad is also sleepy during the day but has not been formally diagnosed with any sleep condition. She reports that no matter how long she sleeps she does not wake up rested, she feels that her sleep is poor quality, has woken herself up with a sense of gasping for air but has not been witnessed to have apneas. Epworth sleepiness score is 19 out of 24 today, fatigue score is 58 out of 63. She denies symptoms of depression.  Her Past Medical History Is Significant For: Past Medical History:  Diagnosis Date   Abdominal pain    related to gallstones   Achilles tendonitis, bilateral    Bronchitis    02/17   Diabetes ([place])    Type II   DKA (diabetic ketoacidoses) 08/25/2014   Gallstones    GERD (gastroesophageal reflux disease)    Headache    migraines   Hypertension    Lattice  degeneration, both eyes    Pancreatitis    PCOS (polycystic ovarian syndrome)    Sinus congestion    occ. uses OTC meds for this    Her Past Surgical History Is Significant For: Past Surgical History:  Procedure Laterality Date   CHOLECYSTECTOMY N/A 06/22/2013   Procedure: LAPAROSCOPIC CHOLECYSTECTOMY ;  Surgeon: Adin Hector, MD;  Location: [facility];  Service: General;  Laterality: N/A;   DILITATION & CURRETTAGE/HYSTROSCOPY WITH HYDROTHERMAL ABLATION N/A 01/03/2016   Procedure: DILATATION & CURETTAGE/HYSTEROSCOPY WITH HYDROTHERMAL ABLATION;  Surgeon: Crawford Givens, MD;  Location: [facility];  Service: Gynecology;  Laterality: N/A;  HTA rep will be attend confirmed by office 12/12/15    NASAL SEPTOPLASTY W/ TURBINOPLASTY Bilateral 01/11/2021   Procedure: NASAL SEPTOPLASTY WITH TURBINATE REDUCTION;  Surgeon: Jerrell Belfast, MD;  Location: [facility];  Service: ENT;  Laterality: Bilateral;   TONSILLECTOMY      Her Family History Is Significant For: Family History  Problem Relation Age of Onset   Hypertension Mother    Diabetes Mother    Goiter Mother    Sleep apnea Mother    Hypertension Father    Diabetes Father    Asthma Father    Diabetes Maternal Aunt    Lung cancer Paternal Grandmother        smoker-heavy   Stroke Paternal Grandfather    Colon cancer Neg Hx    Heart disease Neg Hx     Her Social History Is Significant For: Social History   Socioeconomic History   Marital status: Single    Spouse name: Not on file   Number of children: 0   Years of education: Not on file   Highest education level: Not on file  Occupational History   Occupation: Animal nutritionist: [employer]  Tobacco Use   Smoking status: Never   Smokeless tobacco: Never  Vaping Use   Vaping Use: Never used  Substance and Sexual Activity   Alcohol use: Not Currently    Alcohol/week: 0.0 standard drinks    Comment: 1 drink per month   Drug use: No   Sexual activity: Never     Birth control/protection: None  Other Topics Concern   Not on file  Social History Narrative   Not on file   Social Determinants of Health   Financial Resource Strain: Not on file  Food Insecurity: Not on file  Transportation Needs: Not on file  Physical Activity: Not on file  Stress: Not on file  Social Connections: Not on file    Her Allergies Are:  No Known Allergies:   Her Current Medications Are:  Outpatient Encounter Medications as of 08/27/2021  Medication Sig   Alpha-Lipoic Acid 300 MG CAPS Take by mouth.   amLODipine (NORVASC) 5 MG tablet Take 1 tablet (5 mg total) by mouth daily.   BENFOTIAMINE PO Take by mouth.   Blood Glucose Monitoring Suppl (BLOOD GLUCOSE METER KIT AND SUPPLIES) Dispense based on patient and insurance preference. Use up to four times daily as directed. (FOR ICD-9 250.00, 250.01).   Continuous Blood Gluc Sensor (FREESTYLE LIBRE 2 SENSOR) MISC 2 Devices by Does not apply route every 14 (fourteen) days.   gabapentin (NEURONTIN) 300 MG capsule Take 1 capsule by mouth once daily at bedtime. **needs appt for further refills.** (Patient taking differently: Take 300 mg by mouth at bedtime as needed (pain). Take 1 capsule by mouth once daily at bedtime as needed **needs appt for further refills.**)   glucose blood (ONETOUCH VERIO) test strip USE TO TEST BLOOD SUGAR ONCE DAILY   lisinopril (ZESTRIL) 10 MG tablet Take 1 tablet (10 mg total) by mouth daily. Use as needed for elevated BP   lisinopril-hydrochlorothiazide (ZESTORETIC) 20-25 MG tablet Take 1 tablet by mouth daily.   Multiple Vitamins-Minerals (MULTIVITAMIN WITH MINERALS) tablet Take 1 tablet by mouth daily. With Diabetic Support   OneTouch Delica Lancets 37S MISC Use once daily to check glucose.   Probiotic Product (PROBIOTIC-10 PO) Take 1 capsule by mouth daily. Takes Fortify Women's Digestive Probiotic   tirzepatide (MOUNJARO) 7.5 MG/0.5ML Pen Inject 7.5 mg into the  skin once a week.    aspirin-acetaminophen-caffeine (EXCEDRIN MIGRAINE) 250-250-65 MG tablet Take 2 tablets by mouth every 8 (eight) hours as needed for headache.   benzonatate (TESSALON) 100 MG capsule Take 1 capsule (100 mg total) by mouth 3 (three) times daily as needed for cough.   Cholecalciferol (VITAMIN D3) 1.25 MG (50000 UT) CAPS Take 1 weekly for 12 weeks   famotidine (PEPCID) 10 MG tablet Take 20 mg by mouth daily as needed for heartburn or indigestion.   metFORMIN (GLUCOPHAGE XR) 750 MG 24 hr tablet Take 2 tablets (1,500 mg total) by mouth daily with breakfast.   No facility-administered encounter medications on file as of 08/27/2021.  :  Review of Systems:  Out of a complete 14 point review of systems, all are reviewed and negative with the exception of these symptoms as listed below:  Review of Systems  Neurological:        Pt is here for sleep consult. Pt states she has not worn CPAP in six months . Pt states she had nasal surgery in April and every since than she has not worn her CPAP. Pt also states she has braces and she got a tubal reduction.  Pt denies snoring,headaches the patient states she does have hypertension and fatigue throughout the day   FSS:49 ESS:14   Objective:  Neurological Exam  Physical Exam Physical Examination:   Vitals:   08/27/21 1510  BP: (!) 152/98  Pulse: 98    General Examination: The patient is a very pleasant 40 y.o. female in no acute distress. She appears well-developed and well-nourished and well groomed.   HEENT: Normocephalic, atraumatic, pupils are equal, round and reactive to light, extraocular tracking is well-preserved.  Hearing is grossly intact. Face is symmetric with normal facial animation and normal facial sensation. Speech is clear with no dysarthria noted. There is no hypophonia. There is no lip, neck/head, jaw or voice tremor. Neck is supple with FROM, no carotid bruits on auscultation. Oropharynx exam reveals: mild mouth dryness, good dental  hygiene and moderate airway crowding secondary to larger tongue, smaller airway entry, tonsils are absent. Mallampati is class III. Tongue protrudes centrally and palate elevates symmetrically.  Nasal inspection reveals no septal deviation and no significant inferior turbinate hypertrophy.   Chest: Clear to auscultation without wheezing, rhonchi or crackles noted.   Heart: S1+S2+0, regular and normal without murmurs, rubs or gallops noted.    Abdomen: Soft, non-tender and non-distended.   Extremities: There is no obvious edema.  Skin: Warm and dry without trophic changes noted.   Musculoskeletal: exam reveals no obvious joint deformities.    Neurologically:  Mental status: The patient is awake, alert and oriented in all 4 spheres. Her immediate and remote memory, attention, language skills and fund of knowledge are appropriate. There is no evidence of aphasia, agnosia, apraxia or anomia. Speech is clear with normal prosody and enunciation. Thought process is linear. Mood is normal and affect is normal.  Cranial nerves II - XII are as described above under HEENT exam. Motor exam: Normal bulk, strength and tone is noted. There is no tremor. Fine motor skills and coordination: Grossly intact.    Cerebellar testing: No dysmetria or intention tremor. There is no truncal or gait ataxia.  Sensory exam: intact to light touch in the upper and lower extremities.  Gait, station and balance: She stands easily. No veering to one side is noted. No leaning to one side is noted. Posture is age-appropriate and stance is narrow  based. Gait shows normal stride length and normal pace. No problems turning are noted.   Assessment and plan:  In summary, Angel Dawson is a very pleasant 40 year old right-handed woman with an underlying medical history of bronchitis, diabetes, cholelithiasis, hypertension, pancreatitis, PCOS, reflux disease, and severe obesity with a BMI of over 50, who was previously diagnosed with  overall mild obstructive sleep apnea, more pronounced during REM sleep.  She was started on CPAP therapy.  She was using CPAP therapy in 2018 and 2019. She had nasal septal deviation and congestion and is status post surgery in April 2022.  She has not consistently use her CPAP.  She would like to get reevaluated as her subjective symptoms have improved significantly since her nasal surgery.  Her weight has been more or less stable.  We mutually agreed to proceed with a sleep study.  We will call her with her test results and take it from there.  If she has residual sleep apnea, I may ask her to continue using her CPAP.  The difference between a laboratory attended sleep study versus home sleep test.  She would be able to come in for an overnight sleep study in the sleep lab which would allow [place] to compare findings with her study from 2018. We talked about the importance of maintaining a healthy lifestyle and weight loss.  She is encouraged to limit her caffeine intake and try to get enough rest.  It is important to maintain good sleep hygiene and she is aware of this.  I answered all her questions today and she was in agreement with our plan.   Thank you very much for allowing me to participate in the care of this nice patient. If I can be of any further assistance to you please do not hesitate to call me at [phone].   Sincerely,     Star Age, MD, PhD

## 2021-08-27 NOTE — Patient Instructions (Addendum)
  It was nice to see you again today! I am glad to hear that you have done well after your nasal surgery.   Here is what we discussed today and what we came up with as our plan for you:  We will proceed with a sleep study to reevaluate her sleep apnea.  It is possible that your sleep apnea has improved since your nasal surgery.  If you have residual sleep apnea, I may ask you to continue to use your CPAP.  We will call you with the test results and take it from there. Our sleep lab administrative assistant will call you to schedule your sleep study. If you don't hear back from her by about 2 weeks from now, please feel free to call her at [phone]. You can leave a message with your phone number and concerns, if you get the voicemail box. She will call back as soon as possible.

## 2021-08-28 ENCOUNTER — Other Ambulatory Visit: Payer: Self-pay | Admitting: Endocrinology

## 2021-08-28 DIAGNOSIS — F4320 Adjustment disorder, unspecified: Secondary | ICD-10-CM | POA: Diagnosis not present

## 2021-08-29 ENCOUNTER — Ambulatory Visit (HOSPITAL_BASED_OUTPATIENT_CLINIC_OR_DEPARTMENT_OTHER)
Admission: RE | Admit: 2021-08-29 | Discharge: 2021-08-29 | Disposition: A | Discharge status: Home / Self Care | Payer: BC Managed Care – PPO | Source: Ambulatory Visit | Attending: Family Medicine | Admitting: Family Medicine

## 2021-08-29 ENCOUNTER — Other Ambulatory Visit: Payer: Self-pay

## 2021-08-29 ENCOUNTER — Encounter: Payer: Self-pay | Admitting: Family Medicine

## 2021-08-29 ENCOUNTER — Ambulatory Visit: Payer: BC Managed Care – PPO | Admitting: Family Medicine

## 2021-08-29 VITALS — BP 132/80 | HR 96 | Temp 98.1°F | Resp 18 | Ht 71.0 in | Wt >= 6400 oz

## 2021-08-29 DIAGNOSIS — M5440 Lumbago with sciatica, unspecified side: Secondary | ICD-10-CM | POA: Insufficient documentation

## 2021-08-29 DIAGNOSIS — R300 Dysuria: Secondary | ICD-10-CM

## 2021-08-29 DIAGNOSIS — E1169 Type 2 diabetes mellitus with other specified complication: Secondary | ICD-10-CM

## 2021-08-29 DIAGNOSIS — E559 Vitamin D deficiency, unspecified: Secondary | ICD-10-CM

## 2021-08-29 DIAGNOSIS — Z23 Encounter for immunization: Secondary | ICD-10-CM | POA: Diagnosis not present

## 2021-08-29 DIAGNOSIS — I10 Essential (primary) hypertension: Secondary | ICD-10-CM | POA: Diagnosis not present

## 2021-08-29 DIAGNOSIS — M545 Low back pain, unspecified: Secondary | ICD-10-CM | POA: Diagnosis not present

## 2021-08-29 LAB — VITAMIN D 25 HYDROXY (VIT D DEFICIENCY, FRACTURES): VITD: 25.27 ng/mL — ABNORMAL LOW (ref 30.00–100.00)

## 2021-08-29 LAB — MICROALBUMIN / CREATININE URINE RATIO
Creatinine,U: 87.8 mg/dL
Microalb Creat Ratio: 4.7 mg/g (ref 0.0–30.0)
Microalb, Ur: 4.1 mg/dL — ABNORMAL HIGH (ref 0.0–1.9)

## 2021-08-30 LAB — URINE CULTURE
MICRO NUMBER:: 12651559
SPECIMEN QUALITY:: ADEQUATE

## 2021-08-31 ENCOUNTER — Encounter: Payer: Self-pay | Admitting: Family Medicine

## 2021-09-03 ENCOUNTER — Other Ambulatory Visit (INDEPENDENT_AMBULATORY_CARE_PROVIDER_SITE_OTHER): Payer: BC Managed Care – PPO

## 2021-09-03 ENCOUNTER — Other Ambulatory Visit: Payer: Self-pay

## 2021-09-03 ENCOUNTER — Institutional Professional Consult (permissible substitution): Payer: BC Managed Care – PPO | Admitting: Neurology

## 2021-09-03 DIAGNOSIS — E1165 Type 2 diabetes mellitus with hyperglycemia: Secondary | ICD-10-CM | POA: Diagnosis not present

## 2021-09-03 LAB — HEMOGLOBIN A1C: Hgb A1c MFr Bld: 6 % (ref 4.6–6.5)

## 2021-09-03 LAB — GLUCOSE, RANDOM: Glucose, Bld: 107 mg/dL — ABNORMAL HIGH (ref 70–99)

## 2021-09-04 ENCOUNTER — Other Ambulatory Visit: Payer: BC Managed Care – PPO

## 2021-09-10 ENCOUNTER — Telehealth (INDEPENDENT_AMBULATORY_CARE_PROVIDER_SITE_OTHER): Payer: BC Managed Care – PPO | Admitting: Endocrinology

## 2021-09-10 ENCOUNTER — Encounter: Payer: Self-pay | Admitting: Endocrinology

## 2021-09-10 ENCOUNTER — Other Ambulatory Visit: Payer: Self-pay

## 2021-09-10 VITALS — BP 137/94 | HR 97 | Ht 71.0 in | Wt >= 6400 oz

## 2021-09-10 DIAGNOSIS — E669 Obesity, unspecified: Secondary | ICD-10-CM | POA: Diagnosis not present

## 2021-09-10 DIAGNOSIS — E1169 Type 2 diabetes mellitus with other specified complication: Secondary | ICD-10-CM

## 2021-09-10 DIAGNOSIS — E782 Mixed hyperlipidemia: Secondary | ICD-10-CM | POA: Diagnosis not present

## 2021-09-10 MED ORDER — TIRZEPATIDE 10 MG/0.5ML ~~LOC~~ SOAJ: 10.0000 mg | SUBCUTANEOUS | 3 refills | Status: DC

## 2021-09-10 MED ORDER — ROSUVASTATIN CALCIUM 5 MG PO TABS: 5.0000 mg | ORAL_TABLET | Freq: Every day | ORAL | 3 refills | Status: DC

## 2021-09-10 NOTE — Progress Notes (Signed)
Patient ID: Angel Dawson, female   DOB: 06/03/1981, 40 y.o.   MRN: [id number]          Reason for Appointment: Follow-up Type 2 Diabetes  Referring physician: Ronae [name]  I connected with the above-named patient by video enabled telemedicine application and verified that I am speaking with the correct person. The patient was explained the limitations of evaluation and management by telemedicine and the availability of in person appointments.  Patient also understood that there may be a patient responsible charge related to this service  Location of the patient: Patient's home  Location of the provider: Physician office Only the patient and myself were participating in the encounter The patient understood the above statements and agreed to proceed.   History of Present Illness:          Date of diagnosis of type 2 diabetes mellitus:  2015      Background history:   At the time of diagnosis and 2015 she had extreme thirst and urination and was admitted to the hospital with blood sugars over 400 but no overt acidosis However she was discharged only on metformin 500 mg twice a day which she has continued Prior to that in 2014 she appears to have had mild hyperglycemia with blood sugars up to 195  Recent history:    Non-insulin hypoglycemic drugs the patient is taking are: Mounjaro 7.5 mg weekly  Her A1c is excellent at 6%, highest level 8.3  Fructosamine is 266 last   Current management, blood sugar patterns and problems identified: She has now taking 7.5 mg Mounjaro for about 6 weeks  However even though her dose was increased her blood sugars at home are about the same or slightly higher  Despite some improvement in her satiety she has not lost any weight  She also thinks she is cutting back on carbohydrate  Only occasionally will have mildly increased postprandial readings and only once around 250  She has had issues with back pain and has not been able to exercise  much  She has continued to monitor her blood sugars with the freestyle libre and is monitoring several times a day  No nausea with Mounjaro Metformin was stopped before because of diarrhea  Side effects from medications have been: Diarrhea from high dose metformin  Glucose monitoring:  done less than 1 times a day         Glucometer: Freestyle libre  Interpretation the freestyle libre version 2 for the last 2 weeks and data as follows  Blood sugars are showing very little variability with GV only 16% and fairly uniform readings throughout the day Her Elenor Legato appears to be fairly accurate with glucose 118 on the lab at the same time as her libre reading of 117 There is some variability in her blood sugars after meals but blood sugars overnight are relatively better Blood sugars are fairly stable overnight with average about 116-130 with slight rise early morning POSTPRANDIAL readings are averaging below 160 although occasionally will have readings up to 200 after any meal No hypoglycemia  CGM use % of time 82  2-week average/GV 132  Time in range  96      %  % Time Above 180 4  % Time above 250   % Time Below 70     AVERAGE blood sugar in 2 hourly intervals ranges between 125 in the afternoon and highest 142 after dinner   Previously:  PRE-MEAL Fasting Lunch Dinner Bedtime Overall  Glucose range:       Mean/median: 129    126   POST-MEAL PC Breakfast PC Lunch PC Dinner  Glucose range:     Mean/median: 151 137 143      Self-care:  Breakfast grits, bacon, sometimes omelette              Dietician visit, most recent: 05/2017, previously in classes                Weight history:  Wt Readings from Last 3 Encounters:  09/10/21 (!) 411 lb (186.4 kg)  08/29/21 (!) 411 lb 3.2 oz (186.5 kg)  08/27/21 (!) 414 lb 1.6 oz (187.8 kg)    Glycemic control:    Lab Results  Component Value Date   HGBA1C 6.0 09/03/2021   HGBA1C 8.3 (H) 05/06/2021   HGBA1C 6.8 (H) 11/07/2020    Lab Results  Component Value Date   MICROALBUR 4.1 (H) 08/29/2021   LDLCALC 90 07/09/2021   CREATININE 0.83 07/09/2021   Lab Results  Component Value Date   MICRALBCREAT 4.7 08/29/2021    Lab Results  Component Value Date   FRUCTOSAMINE 266 07/09/2021   FRUCTOSAMINE 269 12/27/2018   FRUCTOSAMINE 258 06/12/2017   No visits with results within 1 Week(s) from this visit.  Latest known visit with results is:  Lab on 09/03/2021  Component Date Value Ref Range Status   Glucose, Bld 09/03/2021 107 (H)  70 - 99 mg/dL Final   Hgb A1c MFr Bld 09/03/2021 6.0  4.6 - 6.5 % Final   Glycemic Control Guidelines for People with Diabetes:Non Diabetic:  <6%Goal of Therapy: <7%Additional Action Suggested:  >8%       Allergies as of 09/10/2021   No Known Allergies      Medication List        Accurate as of September 10, 2021 11:01 AM. If you have any questions, ask your nurse or doctor.          Alpha-Lipoic Acid 300 MG Caps Take by mouth.   amLODipine 5 MG tablet Commonly known as: NORVASC Take 1 tablet (5 mg total) by mouth daily.   BENFOTIAMINE PO Take by mouth.   benzonatate 100 MG capsule Commonly known as: TESSALON Take 1 capsule (100 mg total) by mouth 3 (three) times daily as needed for cough.   blood glucose meter kit and supplies Dispense based on patient and insurance preference. Use up to four times daily as directed. (FOR ICD-9 250.00, 250.01).   famotidine 10 MG tablet Commonly known as: PEPCID Take 20 mg by mouth daily as needed for heartburn or indigestion.   FreeStyle Libre 2 Sensor Misc APPLY EVERY 14 DAYS   gabapentin 300 MG capsule Commonly known as: NEURONTIN Take 1 capsule by mouth once daily at bedtime. **needs appt for further refills.** What changed:  how much to take how to take this when to take this reasons to take this additional instructions   lisinopril 10 MG tablet Commonly known as: ZESTRIL Take 1 tablet (10 mg total) by  mouth daily. Use as needed for elevated BP   lisinopril-hydrochlorothiazide 20-25 MG tablet Commonly known as: ZESTORETIC Take 1 tablet by mouth daily.   multivitamin with minerals tablet Take 1 tablet by mouth daily. With Diabetic Support   OneTouch Delica Lancets 96V Misc Use once daily to check glucose.   OneTouch Verio test strip Generic drug: glucose blood USE TO TEST BLOOD SUGAR ONCE DAILY   PROBIOTIC-10 PO Take 1 capsule  by mouth daily. Takes Fortify Women's Digestive Probiotic   tirzepatide 7.5 MG/0.5ML Pen Commonly known as: MOUNJARO Inject 7.5 mg into the skin once a week.        Allergies: No Known Allergies  Past Medical History:  Diagnosis Date   Abdominal pain    related to gallstones   Achilles tendonitis, bilateral    Bronchitis    02/17   Diabetes ([place])    Type II   DKA (diabetic ketoacidoses) 08/25/2014   Gallstones    GERD (gastroesophageal reflux disease)    Headache    migraines   Hypertension    Lattice degeneration, both eyes    Pancreatitis    PCOS (polycystic ovarian syndrome)    Sinus congestion    occ. uses OTC meds for this    Past Surgical History:  Procedure Laterality Date   CHOLECYSTECTOMY N/A 06/22/2013   Procedure: LAPAROSCOPIC CHOLECYSTECTOMY ;  Surgeon: Adin Hector, MD;  Location: [facility];  Service: General;  Laterality: N/A;   DILITATION & CURRETTAGE/HYSTROSCOPY WITH HYDROTHERMAL ABLATION N/A 01/03/2016   Procedure: DILATATION & CURETTAGE/HYSTEROSCOPY WITH HYDROTHERMAL ABLATION;  Surgeon: Crawford Givens, MD;  Location: [facility];  Service: Gynecology;  Laterality: N/A;  HTA rep will be attend confirmed by office 12/12/15    NASAL SEPTOPLASTY W/ TURBINOPLASTY Bilateral 01/11/2021   Procedure: NASAL SEPTOPLASTY WITH TURBINATE REDUCTION;  Surgeon: Jerrell Belfast, MD;  Location: [facility];  Service: ENT;  Laterality: Bilateral;   TONSILLECTOMY      Family History  Problem Relation Age of Onset   Hypertension Mother     Diabetes Mother    Goiter Mother    Sleep apnea Mother    Hypertension Father    Diabetes Father    Asthma Father    Diabetes Maternal Aunt    Lung cancer Paternal Grandmother        smoker-heavy   Stroke Paternal Grandfather    Colon cancer Neg Hx    Heart disease Neg Hx     Social History:  reports that she has never smoked. She has never used smokeless tobacco. She reports that she does not currently use alcohol. She reports that she does not use drugs.   Review of Systems   Lipid history: Has not been on any statin drugs  with variable results LDL is below 100 but triglycerides are still relatively high    Lab Results  Component Value Date   CHOL 166 07/09/2021   CHOL 173 11/07/2020   CHOL 184 03/11/2019   Lab Results  Component Value Date   HDL 38.20 (L) 07/09/2021   HDL 39.20 11/07/2020   HDL 41.70 03/11/2019   Lab Results  Component Value Date   LDLCALC 90 07/09/2021   LDLCALC 98 11/07/2020   LDLCALC 106 (H) 03/11/2019   Lab Results  Component Value Date   TRIG 192.0 (H) 07/09/2021   TRIG 179.0 (H) 11/07/2020   TRIG 183.0 (H) 03/11/2019   Lab Results  Component Value Date   CHOLHDL 4 07/09/2021   CHOLHDL 4 11/07/2020   CHOLHDL 4 03/11/2019   Lab Results  Component Value Date   LDLDIRECT 87.0 05/16/2019   LDLDIRECT 87.5 09/22/2014            Hypertension: Present for the last 10 years, monitored by PCP  This is followed by her PCP  She is taking amlodipine and lisinopril HCTZ     BP Readings from Last 3 Encounters:  09/10/21 (!) 137/94  08/29/21 132/80  08/27/21 (!) 152/98   Has a high calcium which is not consistent Recently started on 2000 units of vitamin D3 by her PCP  Lab Results  Component Value Date   CALCIUM 10.6 (H) 07/09/2021   CALCIUM 9.7 05/06/2021   CALCIUM 10.2 01/09/2021    Most recent eye exam was 1/22, previously followed by a retina specialist but no diabetic retinopathy present  Has history of paresthesiae in  her feet treated with gabapentin, symptoms are infrequent   Most recent foot exam: 2/22  Reportedly has had history of pancreatitis in January and previously a few years ago with no apparent cause  LABS:  No visits with results within 1 Week(s) from this visit.  Latest known visit with results is:  Lab on 09/03/2021  Component Date Value Ref Range Status   Glucose, Bld 09/03/2021 107 (H)  70 - 99 mg/dL Final   Hgb A1c MFr Bld 09/03/2021 6.0  4.6 - 6.5 % Final   Glycemic Control Guidelines for People with Diabetes:Non Diabetic:  <6%Goal of Therapy: <7%Additional Action Suggested:  >8%     Physical Examination:  BP (!) 137/94   Pulse 97   Ht 5' 11"  (1.803 m)   Wt (!) 411 lb (186.4 kg)   BMI 57.32 kg/m        ASSESSMENT:  Diabetes type 2, with Morbid obesity   See history of present illness for detailed discussion of current diabetes management, blood sugar patterns and problems identified  Her A1c is last 8.1  Current regimen is metformin and Mounjaro 5 mg Fructosamine is improved at 266  Her blood sugars are very significantly improved with adding Mounjaro which she has taken for about 2 months, recently 5 mg without side effects  Her blood sugars are still only occasionally going up postprandially based on her carbohydrate intake and morning readings are averaging about 130 Currently has not had any weight loss possibly because of improved blood sugars However she is not exercising also  LIPIDS: LDL below 100 but triglycerides are still relatively high, may improve with weight loss  Hypertension: Blood pressure is under fair control  Mild increase in calcium: This is new and will need to continue to follow  Mild neuropathy: Not clear if her symptoms of coldness in her feet are related to neuropathy  PLAN:     She will go up to 10 mg of Mounjaro when she finishes her next 2 injections of 7.5 mg Encourage her to start exercising anyway she can do to help with  weight loss   Dyslipidemia: Discussed high triglycerides as well as need for cardiovascular risk reduction with a statin regardless of LDL level for long-term benefits and she agrees to start Crestor 5 mg daily, she can try 1 tablet every other day for the first week and discussed that this is safe at the lower dose and very unlikely to cause side effects  Recheck lipids on the next visit  She will need to follow-up with her PCP for hypertension  There are no Patient Instructions on file for this visit.       Elayne Snare 09/10/2021, 11:01 AM   Note: This office note was prepared with Dragon voice recognition system technology. Any transcriptional errors that result from this process are unintentional.

## 2021-09-11 DIAGNOSIS — F4320 Adjustment disorder, unspecified: Secondary | ICD-10-CM | POA: Diagnosis not present

## 2021-09-18 DIAGNOSIS — F4320 Adjustment disorder, unspecified: Secondary | ICD-10-CM | POA: Diagnosis not present

## 2021-09-30 ENCOUNTER — Ambulatory Visit (INDEPENDENT_AMBULATORY_CARE_PROVIDER_SITE_OTHER): Payer: BC Managed Care – PPO | Admitting: Neurology

## 2021-09-30 DIAGNOSIS — G4733 Obstructive sleep apnea (adult) (pediatric): Secondary | ICD-10-CM | POA: Diagnosis not present

## 2021-09-30 DIAGNOSIS — G4719 Other hypersomnia: Secondary | ICD-10-CM

## 2021-09-30 DIAGNOSIS — Z6841 Body Mass Index (BMI) 40.0 and over, adult: Secondary | ICD-10-CM

## 2021-09-30 DIAGNOSIS — Z9889 Other specified postprocedural states: Secondary | ICD-10-CM

## 2021-10-01 NOTE — Progress Notes (Signed)
° °  [facility]  HOME SLEEP TEST (Watch PAT) REPORT  STUDY DATE: 09/30/2021  DOB: December 15, 1980  MRN: [id number]  ORDERING CLINICIAN: Star Age, MD, PhD   REFERRING CLINICIAN: Dr. Wilburn Cornelia  CLINICAL INFORMATION/HISTORY: 40 year old right-handed woman with an underlying medical history of bronchitis, diabetes, cholelithiasis, hypertension, pancreatitis, PCOS, reflux disease, and severe obesity with a BMI of over 46, who was previously diagnosed with obstructive sleep apnea and placed on CPAP therapy.  She presents for reevaluation of her obstructive sleep apnea.  Epworth sleepiness score: 14/24.  BMI: 58 kg/m  FINDINGS:   Sleep Summary:   Total Recording Time (hours, min): 9 hours, 11 minutes  Total Sleep Time (hours, min):  6 hours, 43 minutes   Percent REM (%):    22%   Respiratory Indices:   Calculated pAHI (per hour):  14.5/hour         REM pAHI:    28.9/hour       NREM pAHI: 11.2/hour  Oxygen Saturation Statistics:    Oxygen Saturation (%) Mean: 94%   Minimum oxygen saturation (%):                 85%   O2 Saturation Range (%): 85-99%    O2 Saturation (minutes) <=88%: 0.2 min  Pulse Rate Statistics:   Pulse Mean (bpm):    90/min    Pulse Range (57-105/min)   IMPRESSION: OSA (obstructive sleep apnea)   RECOMMENDATION:  This home sleep test demonstrates overall mild (near moderate) obstructive sleep apnea with a total AHI of 14.5/hour and O2 nadir of 85%.  Intermittent snoring was noted, mostly in the mild to moderate range.  Given the patient's medical history and sleep related complaints, treatment with positive airway pressure is recommended. This can be achieved in the form of autoPAP trial/titration at home. A full night CPAP titration study will help with proper treatment settings and mask fitting if needed.  If she is not eligible for a new AutoPap machine, she can resume using her current CPAP machine. Alternative treatments include  weight loss along with avoidance of the supine sleep position, or an oral appliance in appropriate candidates.  Concomitant loss will likely aid in reducing her sleep disordered breathing. Please note that untreated obstructive sleep apnea may carry additional perioperative morbidity. Patients with significant obstructive sleep apnea should receive perioperative PAP therapy and the surgeons and particularly the anesthesiologist should be informed of the diagnosis and the severity of the sleep disordered breathing. The patient should be cautioned not to drive, work at heights, or operate dangerous or heavy equipment when tired or sleepy. Review and reiteration of good sleep hygiene measures should be pursued with any patient. Other causes of the patient's symptoms, including circadian rhythm disturbances, an underlying mood disorder, medication effect and/or an underlying medical problem cannot be ruled out based on this test. Clinical correlation is recommended.   The patient and her referring provider will be notified of the test results. The patient will be seen in follow up in sleep clinic at [facility].  I certify that I have reviewed the raw data recording prior to the issuance of this report in accordance with the standards of the American Academy of Sleep Medicine (AASM).  INTERPRETING PHYSICIAN:   Star Age, MD, PhD  Board Certified in Neurology and Sleep Medicine  [facility] [address] [place], [place] [address] [phone]

## 2021-10-03 DIAGNOSIS — F4320 Adjustment disorder, unspecified: Secondary | ICD-10-CM | POA: Diagnosis not present

## 2021-10-09 DIAGNOSIS — F4320 Adjustment disorder, unspecified: Secondary | ICD-10-CM | POA: Diagnosis not present

## 2021-10-15 NOTE — Procedures (Signed)
° °  [facility]  HOME SLEEP TEST (Watch PAT) REPORT  STUDY DATE: 09/30/2021  DOB: 05-Jun-1981  MRN: [id number]  ORDERING CLINICIAN: Star Age, MD, PhD   REFERRING CLINICIAN: Dr. Wilburn Cornelia  CLINICAL INFORMATION/HISTORY: 41 year old right-handed woman with an underlying medical history of bronchitis, diabetes, cholelithiasis, hypertension, pancreatitis, PCOS, reflux disease, and severe obesity with a BMI of over 26, who was previously diagnosed with obstructive sleep apnea and placed on CPAP therapy.  She presents for reevaluation of her obstructive sleep apnea.  Epworth sleepiness score: 14/24.  BMI: 58 kg/m  FINDINGS:   Sleep Summary:   Total Recording Time (hours, min): 9 hours, 11 minutes  Total Sleep Time (hours, min):  6 hours, 43 minutes   Percent REM (%):    22%   Respiratory Indices:   Calculated pAHI (per hour):  14.5/hour         REM pAHI:    28.9/hour       NREM pAHI: 11.2/hour  Oxygen Saturation Statistics:    Oxygen Saturation (%) Mean: 94%   Minimum oxygen saturation (%):                 85%   O2 Saturation Range (%): 85-99%    O2 Saturation (minutes) <=88%: 0.2 min  Pulse Rate Statistics:   Pulse Mean (bpm):    90/min    Pulse Range (57-105/min)   IMPRESSION: OSA (obstructive sleep apnea)   RECOMMENDATION:  This home sleep test demonstrates overall mild (near moderate) obstructive sleep apnea with a total AHI of 14.5/hour and O2 nadir of 85%.  Intermittent snoring was noted, mostly in the mild to moderate range.  Given the patient's medical history and sleep related complaints, treatment with positive airway pressure is recommended. This can be achieved in the form of autoPAP trial/titration at home. A full night CPAP titration study will help with proper treatment settings and mask fitting if needed.  If she is not eligible for a new AutoPap machine, she can resume using her current CPAP machine. Alternative treatments include  weight loss along with avoidance of the supine sleep position, or an oral appliance in appropriate candidates.  Concomitant loss will likely aid in reducing her sleep disordered breathing. Please note that untreated obstructive sleep apnea may carry additional perioperative morbidity. Patients with significant obstructive sleep apnea should receive perioperative PAP therapy and the surgeons and particularly the anesthesiologist should be informed of the diagnosis and the severity of the sleep disordered breathing. The patient should be cautioned not to drive, work at heights, or operate dangerous or heavy equipment when tired or sleepy. Review and reiteration of good sleep hygiene measures should be pursued with any patient. Other causes of the patient's symptoms, including circadian rhythm disturbances, an underlying mood disorder, medication effect and/or an underlying medical problem cannot be ruled out based on this test. Clinical correlation is recommended.   The patient and her referring provider will be notified of the test results. The patient will be seen in follow up in sleep clinic at [facility].  I certify that I have reviewed the raw data recording prior to the issuance of this report in accordance with the standards of the American Academy of Sleep Medicine (AASM).  INTERPRETING PHYSICIAN:   Star Age, MD, PhD  Board Certified in Neurology and Sleep Medicine  [facility] [address], [place], [place] [address] [phone]

## 2021-10-16 ENCOUNTER — Telehealth: Payer: Self-pay

## 2021-10-16 DIAGNOSIS — F4320 Adjustment disorder, unspecified: Secondary | ICD-10-CM | POA: Diagnosis not present

## 2021-10-16 DIAGNOSIS — G4733 Obstructive sleep apnea (adult) (pediatric): Secondary | ICD-10-CM

## 2021-10-16 NOTE — Telephone Encounter (Signed)
-----   Message from Star Age, MD sent at 10/15/2021 10:55 AM EST ----- Patient referred by Dr. Wilburn Cornelia for reevaluation of her OSA.  I saw her on 08/27/2021 when she had a home sleep test on 09/30/2021. Please call and notify the patient that the recent home sleep test showed obstructive sleep apnea. OSA is overall mild, and in the similar range as before in 2018.  I recommend a trial of AutoPap.  She may not be eligible for a new machine though.  If she has a current CPAP machine, we can see if we can use the current settings or we can try to change it to an AutoPap setting which is a variable pressure.   Let me know if she would like to proceed with treating her sleep apnea with a CPAP or AutoPap machine again.  She had stopped using her machine consistently some time ago.   If she is interested in resuming treatment with a machine, I can send an order for supplies for her current CPAP machine, or we can try to see if she is eligible for a new AutoPap machine through her DME.  If she is not eligible for new machine, we can see if her current machine can be changed to an AutoPap setting, if not, she can resume her current CPAP of 7 cm for now.  She will need a follow-up within 3 months of starting treatment.  Alternative treatments include dental device to dentistry.  I can make a referral if she would rather do that.  I would also recommend concomitant weight loss.   Star Age, MD, PhD [facility]

## 2021-10-16 NOTE — Telephone Encounter (Signed)
I called pt. I advised pt that Dr. Rexene Alberts reviewed their sleep study results and found that pt has a degree of OSA. Dr. Rexene Alberts recommends that pt start on an autopap for treatment. I reviewed PAP compliance expectations with the pt. Pt is agreeable to starting an auto-PAP. I advised pt that an order will be sent to a DME, [facility], and [facility] will call the pt within about one week after they file with the pt's insurance. [facility] will show the pt how to use the machine, fit for masks, and troubleshoot the auto-PAP if needed. A follow up appt was made for insurance purposes with Ward Givens, NP on 01/16/2022 at 1130 am. Pt verbalized understanding to arrive 15 minutes early and bring their auto-PAP. A letter with all of this information in it will be mailed to the pt as a reminder. I verified with the pt that the address we have on file is correct. Pt verbalized understanding of results. Pt had no questions at this time but was encouraged to call back if questions arise. I have sent the order to [facility] and have received confirmation that they have received the order.

## 2021-10-17 NOTE — Telephone Encounter (Signed)
New autoPAP order placed.

## 2021-10-17 NOTE — Addendum Note (Signed)
Addended by: Star Age on: 10/17/2021 04:54 PM   Modules accepted: Orders

## 2021-10-17 NOTE — Telephone Encounter (Signed)
Order has been sent to [facility].

## 2021-10-18 DIAGNOSIS — G4733 Obstructive sleep apnea (adult) (pediatric): Secondary | ICD-10-CM | POA: Diagnosis not present

## 2021-10-21 NOTE — Telephone Encounter (Signed)
Received this message from [facility]: Brynda Peon, RN; Vanessa Ralphs Hey this patient is not eligible for a new machine as she just received hers on 10/29/17. I changed her pressure on her current machine to reflect the newest RX. I called her and let her know of this and she would like for me to send out supplies. I will have those sent out.

## 2021-10-21 NOTE — Telephone Encounter (Signed)
Noted, thank you

## 2021-10-22 ENCOUNTER — Telehealth: Payer: Self-pay | Admitting: Endocrinology

## 2021-10-22 NOTE — Telephone Encounter (Signed)
Patient requests to be called at ph# [phone] re: PHARM told Patient they are unable to provide tirzepatide [facility]) 10 MG/0.5ML Pen until 10/28/21. Patient would have to miss dose for 10/23/21. Patient states she has an active RX for Mounjaro 7.5 MG and questions if she is better off skipping the dose for 10/23/21 or filling RX for Mounjaro 7.5 MG.

## 2021-10-25 NOTE — Telephone Encounter (Signed)
Attempted to call patient. No answer vm left to call back

## 2021-10-28 NOTE — Telephone Encounter (Signed)
Called patient states she was able to get 10mg  mounjaro. So no Rx needs to be sent

## 2021-10-30 DIAGNOSIS — F4320 Adjustment disorder, unspecified: Secondary | ICD-10-CM | POA: Diagnosis not present

## 2021-11-04 DIAGNOSIS — F4320 Adjustment disorder, unspecified: Secondary | ICD-10-CM | POA: Diagnosis not present

## 2021-11-11 DIAGNOSIS — F4320 Adjustment disorder, unspecified: Secondary | ICD-10-CM | POA: Diagnosis not present

## 2021-11-18 DIAGNOSIS — F4320 Adjustment disorder, unspecified: Secondary | ICD-10-CM | POA: Diagnosis not present

## 2021-11-22 DIAGNOSIS — G4733 Obstructive sleep apnea (adult) (pediatric): Secondary | ICD-10-CM | POA: Diagnosis not present

## 2021-11-25 DIAGNOSIS — F4320 Adjustment disorder, unspecified: Secondary | ICD-10-CM | POA: Diagnosis not present

## 2021-12-02 DIAGNOSIS — Z6841 Body Mass Index (BMI) 40.0 and over, adult: Secondary | ICD-10-CM | POA: Diagnosis not present

## 2021-12-02 DIAGNOSIS — Z1231 Encounter for screening mammogram for malignant neoplasm of breast: Secondary | ICD-10-CM | POA: Diagnosis not present

## 2021-12-02 DIAGNOSIS — Z124 Encounter for screening for malignant neoplasm of cervix: Secondary | ICD-10-CM | POA: Diagnosis not present

## 2021-12-02 DIAGNOSIS — R339 Retention of urine, unspecified: Secondary | ICD-10-CM | POA: Diagnosis not present

## 2021-12-02 DIAGNOSIS — Z01419 Encounter for gynecological examination (general) (routine) without abnormal findings: Secondary | ICD-10-CM | POA: Diagnosis not present

## 2021-12-03 DIAGNOSIS — F4320 Adjustment disorder, unspecified: Secondary | ICD-10-CM | POA: Diagnosis not present

## 2021-12-05 ENCOUNTER — Encounter: Payer: Self-pay | Admitting: Endocrinology

## 2021-12-05 DIAGNOSIS — H9312 Tinnitus, left ear: Secondary | ICD-10-CM | POA: Diagnosis not present

## 2021-12-05 DIAGNOSIS — H93A1 Pulsatile tinnitus, right ear: Secondary | ICD-10-CM | POA: Diagnosis not present

## 2021-12-05 DIAGNOSIS — H9313 Tinnitus, bilateral: Secondary | ICD-10-CM | POA: Insufficient documentation

## 2021-12-05 DIAGNOSIS — Z9889 Other specified postprocedural states: Secondary | ICD-10-CM | POA: Diagnosis not present

## 2021-12-06 MED ORDER — TIRZEPATIDE 5 MG/0.5ML ~~LOC~~ SOAJ: 5.0000 mg | SUBCUTANEOUS | 0 refills | Status: DC

## 2021-12-09 DIAGNOSIS — F4320 Adjustment disorder, unspecified: Secondary | ICD-10-CM | POA: Diagnosis not present

## 2021-12-20 DIAGNOSIS — G4733 Obstructive sleep apnea (adult) (pediatric): Secondary | ICD-10-CM | POA: Diagnosis not present

## 2021-12-23 ENCOUNTER — Other Ambulatory Visit (INDEPENDENT_AMBULATORY_CARE_PROVIDER_SITE_OTHER): Payer: BC Managed Care – PPO

## 2021-12-23 ENCOUNTER — Other Ambulatory Visit: Payer: Self-pay

## 2021-12-23 DIAGNOSIS — E782 Mixed hyperlipidemia: Secondary | ICD-10-CM

## 2021-12-23 DIAGNOSIS — E669 Obesity, unspecified: Secondary | ICD-10-CM | POA: Diagnosis not present

## 2021-12-23 DIAGNOSIS — E1169 Type 2 diabetes mellitus with other specified complication: Secondary | ICD-10-CM

## 2021-12-23 LAB — LIPID PANEL
Cholesterol: 203 mg/dL — ABNORMAL HIGH (ref 0–200)
HDL: 41 mg/dL (ref >39.00)
LDL Cholesterol: 126 mg/dL — ABNORMAL HIGH (ref 0–99)
NonHDL: 161.76
Total CHOL/HDL Ratio: 5
Triglycerides: 181 mg/dL — ABNORMAL HIGH (ref 0.0–149.0)
VLDL: 36.2 mg/dL (ref 0.0–40.0)

## 2021-12-23 LAB — COMPREHENSIVE METABOLIC PANEL
ALT: 23 U/L (ref 0–35)
AST: 20 U/L (ref 0–37)
Albumin: 4.6 g/dL (ref 3.5–5.2)
Alkaline Phosphatase: 83 U/L (ref 39–117)
BUN: 14 mg/dL (ref 6–23)
CO2: 25 mEq/L (ref 19–32)
Calcium: 10.4 mg/dL (ref 8.4–10.5)
Chloride: 102 mEq/L (ref 96–112)
Creatinine, Ser: 0.95 mg/dL (ref 0.40–1.20)
GFR: 73.89 mL/min (ref >60.00)
Glucose, Bld: 147 mg/dL — ABNORMAL HIGH (ref 70–99)
Potassium: 4.1 mEq/L (ref 3.5–5.1)
Sodium: 138 mEq/L (ref 135–145)
Total Bilirubin: 0.3 mg/dL (ref 0.2–1.2)
Total Protein: 7.2 g/dL (ref 6.0–8.3)

## 2021-12-23 LAB — HEMOGLOBIN A1C: Hgb A1c MFr Bld: 6.5 % (ref 4.6–6.5)

## 2021-12-26 ENCOUNTER — Other Ambulatory Visit: Payer: Self-pay

## 2021-12-26 ENCOUNTER — Ambulatory Visit: Payer: BC Managed Care – PPO | Admitting: Endocrinology

## 2021-12-26 ENCOUNTER — Encounter: Payer: Self-pay | Admitting: Endocrinology

## 2021-12-26 VITALS — BP 140/80 | HR 96 | Ht 71.0 in | Wt >= 6400 oz

## 2021-12-26 DIAGNOSIS — E1165 Type 2 diabetes mellitus with hyperglycemia: Secondary | ICD-10-CM | POA: Diagnosis not present

## 2021-12-26 DIAGNOSIS — I10 Essential (primary) hypertension: Secondary | ICD-10-CM | POA: Diagnosis not present

## 2021-12-26 DIAGNOSIS — E782 Mixed hyperlipidemia: Secondary | ICD-10-CM

## 2021-12-26 MED ORDER — EMPAGLIFLOZIN 10 MG PO TABS: 10.0000 mg | ORAL_TABLET | Freq: Every day | ORAL | 3 refills | Status: DC

## 2021-12-26 NOTE — Patient Instructions (Signed)
With jardiance reduce amlodipine to 1/2 pill ? ? ?

## 2021-12-26 NOTE — Progress Notes (Addendum)
Patient ID: TING CAGE, female   DOB: [birth year], 41 y.o.   MRN: [id number] ?       ? ? ?Reason for Appointment: Follow-up Type 2 Diabetes ? ?Referring physician: [name] [name] ? ? ? ?History of Present Illness:  ?        ?Date of diagnosis of type 2 diabetes mellitus:  2015     ? ?Background history:  ? ?At the time of diagnosis and 2015 she had extreme thirst and urination and was admitted to the hospital with blood sugars over 400 but no overt acidosis ?However she was discharged only on metformin 500 mg twice a day which she has continued ?Prior to that in 2014 she appears to have had mild hyperglycemia with blood sugars up to 195 ? ?Recent history:  ? ? ?Non-insulin hypoglycemic drugs the patient is taking are: Mounjaro 10 mg weekly ? ?Her A1c is still relatively good at 6.5 ? ?Fructosamine is 266 last ? ?Current management, blood sugar patterns and problems identified: ?She has now taking 10 mg Mounjaro ?However she was not able to get this at the pharmacy in February and only started back on the 5 mg recently because of the gap ?With taking the 5 mg Mounjaro or not at all her blood sugars were mostly higher after meals ?However labile recently her fasting blood sugars are averaging in the 150s with some variability in lab glucose 147 fasting  ?She has however started exercising including some strength training regularly  ?However she has not lost any weight ?She says that she has no nausea with Mounjaro but it tends to suppress her appetite  ?Her trainer at the gym asks her to increase her overall food intake even though her dose was increased her blood sugars at home are about the same or slightly higher  ?On an average her blood sugars are similar throughout the day with generally much better readings in the last 3 to 4 days ?Metformin was stopped before because of diarrhea previously ? ?Side effects from medications have been: Diarrhea from high dose metformin ? ?Glucose monitoring:  done less than 1  times a day         Glucometer: Freestyle libre ? ?Data as follows ? ?CGM use % of time 92  ?2-week average/GV 166/21  ?Time in range    73    %  ?% Time Above 180 24  ?% Time above 250 3  ?% Time Below 70   ? ?  ?PRE-MEAL Fasting Lunch Dinner Bedtime Overall  ?Glucose range:       ?Averages: 157 157 163  166  ? ?POST-MEAL PC Breakfast PC Lunch PC Dinner  ?Glucose range:     ?Averages: 187 155 175  ? ?Previously ? ?CGM use % of time 82  ?2-week average/GV 132  ?Time in range  96      %  ?% Time Above 180 4  ?% Time above 250   ?% Time Below 70   ? ? ?AVERAGE blood sugar in 2 hourly intervals ranges between 125 in the afternoon and highest 142 after dinner ? ? ? Self-care:  ?Breakfast grits, bacon, sometimes omelette ?             ?Dietician visit, most recent: 05/2017, previously in classes ?              ? ?Weight history: ? ?Wt Readings from Last 3 Encounters:  ?12/26/21 (!) 408 lb 9.6 oz (185.3 kg)  ?  09/10/21 (!) 411 lb (186.4 kg)  ?08/29/21 (!) 411 lb 3.2 oz (186.5 kg)  ? ? ?Glycemic control:  ?  ?Lab Results  ?Component Value Date  ? HGBA1C 6.5 12/23/2021  ? HGBA1C 6.0 09/03/2021  ? HGBA1C 8.3 (H) 05/06/2021  ? ?Lab Results  ?Component Value Date  ? MICROALBUR 4.1 (H) 08/29/2021  ? LDLCALC 126 (H) 12/23/2021  ? CREATININE 0.95 12/23/2021  ? ?Lab Results  ?Component Value Date  ? MICRALBCREAT 4.7 08/29/2021  ? ? ?Lab Results  ?Component Value Date  ? FRUCTOSAMINE 266 07/09/2021  ? FRUCTOSAMINE 269 12/27/2018  ? FRUCTOSAMINE 258 06/12/2017  ? ?Lab on 12/23/2021  ?Component Date Value Ref Range Status  ? Cholesterol 12/23/2021 203 (H)  0 - 200 mg/dL Final  ? ATP III Classification       Desirable:  < 200 mg/dL               Borderline High:  200 - 239 mg/dL          High:  > = 240 mg/dL  ? Triglycerides 12/23/2021 181.0 (H)  0.0 - 149.0 mg/dL Final  ? Normal:  <150 mg/dLBorderline High:  150 - 199 mg/dL  ? HDL 12/23/2021 41.00  >39.00 mg/dL Final  ? VLDL 12/23/2021 36.2  0.0 - 40.0 mg/dL Final  ? LDL Cholesterol  12/23/2021 126 (H)  0 - 99 mg/dL Final  ? Total CHOL/HDL Ratio 12/23/2021 5   Final  ?                Men          Women1/2 Average Risk     3.4          3.3Average Risk          5.0          4.42X Average Risk          9.6          7.13X Average Risk          15.0          11.0                      ? NonHDL 12/23/2021 161.76   Final  ? NOTE:  Non-HDL goal should be 30 mg/dL higher than patient's LDL goal (i.e. LDL goal of < 70 mg/dL, would have non-HDL goal of < 100 mg/dL)  ? Sodium 12/23/2021 138  135 - 145 mEq/L Final  ? Potassium 12/23/2021 4.1  3.5 - 5.1 mEq/L Final  ? Chloride 12/23/2021 102  96 - 112 mEq/L Final  ? CO2 12/23/2021 25  19 - 32 mEq/L Final  ? Glucose, Bld 12/23/2021 147 (H)  70 - 99 mg/dL Final  ? BUN 12/23/2021 14  6 - 23 mg/dL Final  ? Creatinine, Ser 12/23/2021 0.95  0.40 - 1.20 mg/dL Final  ? Total Bilirubin 12/23/2021 0.3  0.2 - 1.2 mg/dL Final  ? Alkaline Phosphatase 12/23/2021 83  39 - 117 U/L Final  ? AST 12/23/2021 20  0 - 37 U/L Final  ? ALT 12/23/2021 23  0 - 35 U/L Final  ? Total Protein 12/23/2021 7.2  6.0 - 8.3 g/dL Final  ? Albumin 12/23/2021 4.6  3.5 - 5.2 g/dL Final  ? GFR 12/23/2021 73.89  >60.00 mL/min Final  ? Calculated using the CKD-EPI Creatinine Equation (2021)  ? Calcium 12/23/2021 10.4  8.4 - 10.5 mg/dL Final  ? Hgb  A1c MFr Bld 12/23/2021 6.5  4.6 - 6.5 % Final  ? Glycemic Control Guidelines for People with Diabetes:Non Diabetic:  <6%Goal of Therapy: <7%Additional Action Suggested:  >8%   ? ? ? ? ?Allergies as of 12/26/2021   ?No Known Allergies ?  ? ?  ?Medication List  ?  ? ?  ? Accurate as of December 26, 2021 10:39 AM. If you have any questions, ask your nurse or doctor.  ?  ?  ? ?  ? ?STOP taking these medications   ? ?benzonatate 100 MG capsule ?Commonly known as: TESSALON ?Stopped by: Elayne Snare, MD ?  ?gabapentin 300 MG capsule ?Commonly known as: NEURONTIN ?Stopped by: Elayne Snare, MD ?  ?lisinopril 10 MG tablet ?Commonly known as: ZESTRIL ?Stopped by: Elayne Snare,  MD ?  ? ?  ? ?TAKE these medications   ? ?Alpha-Lipoic Acid 300 MG Caps ?Take by mouth. ?  ?amLODipine 5 MG tablet ?Commonly known as: NORVASC ?Take 1 tablet (5 mg total) by mouth daily. ?  ?BENFOTIAMINE PO ?Take by mouth. ?  ?blood glucose meter kit and supplies ?Dispense based on patient and insurance preference. Use up to four times daily as directed. (FOR ICD-9 250.00, 250.01). ?  ?Claritin 10 MG tablet ?Generic drug: loratadine ?Claritin 10 mg tablet ? Take 1 tablet every day by oral route. ?  ?empagliflozin 10 MG Tabs tablet ?Commonly known as: Jardiance ?Take 1 tablet (10 mg total) by mouth daily with breakfast. ?Started by: Elayne Snare, MD ?  ?famotidine 10 MG tablet ?Commonly known as: PEPCID ?Take 20 mg by mouth daily as needed for heartburn or indigestion. ?  ?FreeStyle Libre 2 Sensor Misc ?APPLY EVERY 14 DAYS ?  ?lisinopril-hydrochlorothiazide 20-25 MG tablet ?Commonly known as: ZESTORETIC ?Take 1 tablet by mouth daily. ?  ?multivitamin with minerals tablet ?Take 1 tablet by mouth daily. With Diabetic Support ?  ?OneTouch Delica Lancets 47S Misc ?Use once daily to check glucose. ?  ?OneTouch Verio test strip ?Generic drug: glucose blood ?USE TO TEST BLOOD SUGAR ONCE DAILY ?  ?PROBIOTIC-10 PO ?Take 1 capsule by mouth daily. Takes Fortify Women's Digestive Probiotic ?  ?rosuvastatin 5 MG tablet ?Commonly known as: Crestor ?Take 1 tablet (5 mg total) by mouth daily. Start with every other day for 1 week and then once daily ?  ?tirzepatide 10 MG/0.5ML Pen ?Commonly known as: MOUNJARO ?Inject 10 mg into the skin once a week. ?What changed: Another medication with the same name was removed. Continue taking this medication, and follow the directions you see here. ?Changed by: Elayne Snare, MD ?  ? ?  ? ? ?Allergies: No Known Allergies ? ?Past Medical History:  ?Diagnosis Date  ? Abdominal pain   ? related to gallstones  ? Achilles tendonitis, bilateral   ? Bronchitis   ? 02/17  ? Diabetes ([place])   ? Type II  ? DKA  (diabetic ketoacidoses) 08/25/2014  ? Gallstones   ? GERD (gastroesophageal reflux disease)   ? Headache   ? migraines  ? Hypertension   ? Lattice degeneration, both eyes   ? Pancreatitis   ? PCOS (polyc

## 2021-12-27 ENCOUNTER — Other Ambulatory Visit (HOSPITAL_COMMUNITY): Payer: Self-pay

## 2021-12-27 ENCOUNTER — Telehealth: Payer: Self-pay

## 2021-12-27 NOTE — Telephone Encounter (Signed)
Patient Advocate Encounter ?  ?Received notification from [facility] that prior authorization for Jardiance '10mg'$  tabs is required by his/her insurance [employer]. ?  ?PA submitted on 12/27/21 ? ?Key#: [id number] ? ?Status is pending ?   ?[facility] will continue to follow: ? ?Patient Advocate ?Fax: [phone]  ?

## 2021-12-27 NOTE — Telephone Encounter (Signed)
Patient Advocate Encounter ? ?Prior Authorization for Jardiance '10mg'$  tabs has been approved.   ? ?PA# [id number]  ? ?Effective dates: 11/27/21 through 12/27/22 ? ?Per Test Claim Patients co-pay is $50.  ? ?Spoke with Pharmacy to Process. ? ?Patient Advocate ?Fax: [phone]  ?

## 2021-12-31 DIAGNOSIS — F4320 Adjustment disorder, unspecified: Secondary | ICD-10-CM | POA: Diagnosis not present

## 2022-01-02 ENCOUNTER — Encounter: Payer: Self-pay | Admitting: Family Medicine

## 2022-01-15 ENCOUNTER — Ambulatory Visit: Payer: BC Managed Care – PPO | Admitting: Adult Health

## 2022-01-15 ENCOUNTER — Encounter: Payer: Self-pay | Admitting: Adult Health

## 2022-01-15 VITALS — BP 117/74 | HR 94 | Ht 71.0 in | Wt >= 6400 oz

## 2022-01-15 DIAGNOSIS — G4733 Obstructive sleep apnea (adult) (pediatric): Secondary | ICD-10-CM | POA: Diagnosis not present

## 2022-01-15 DIAGNOSIS — Z9989 Dependence on other enabling machines and devices: Secondary | ICD-10-CM | POA: Diagnosis not present

## 2022-01-15 NOTE — Progress Notes (Signed)
? ? ?PATIENT: Angel Dawson ?DOB: 03-23-1981 ? ?REASON FOR VISIT: follow up ?HISTORY FROM: patient ?PRIMARY NEUROLOGIST: Dr. Rexene Alberts ? ?HISTORY OF PRESENT ILLNESS: ?Today 01/15/22: ? ?Angel Dawson is a 41 year old female with a history of obstructive sleep apnea on CPAP.  She returns today for follow-up.  She reports that there are times that she feels that the air is too strong.  She is wondering if AutoSet would be more comfortable for her.  She denies any other issues.  Her download is below ? ? ? ?REVIEW OF SYSTEMS: Out of a complete 14 system review of symptoms, the patient complains only of the following symptoms, and all other reviewed systems are negative. ? ?FSS 50 ?ESS 10 ? ?ALLERGIES: ?No Known Allergies ? ?HOME MEDICATIONS: ?Outpatient Medications Prior to Visit  ?Medication Sig Dispense Refill  ? Alpha-Lipoic Acid 300 MG CAPS Take by mouth.    ? amLODipine (NORVASC) 5 MG tablet Take 1 tablet (5 mg total) by mouth daily. 90 tablet 3  ? BENFOTIAMINE PO Take by mouth.    ? Blood Glucose Monitoring Suppl (BLOOD GLUCOSE METER KIT AND SUPPLIES) Dispense based on patient and insurance preference. Use up to four times daily as directed. (FOR ICD-9 250.00, 250.01). 1 each 0  ? Continuous Blood Gluc Sensor (FREESTYLE LIBRE 2 SENSOR) MISC APPLY EVERY 14 DAYS 2 each 3  ? empagliflozin (JARDIANCE) 10 MG TABS tablet Take 1 tablet (10 mg total) by mouth daily with breakfast. 30 tablet 3  ? glucose blood (ONETOUCH VERIO) test strip USE TO TEST BLOOD SUGAR ONCE DAILY 100 strip 3  ? lisinopril-hydrochlorothiazide (ZESTORETIC) 20-25 MG tablet Take 1 tablet by mouth daily. 90 tablet 3  ? loratadine (CLARITIN) 10 MG tablet Claritin 10 mg tablet ? Take 1 tablet every day by oral route.    ? Multiple Vitamins-Minerals (MULTIVITAMIN WITH MINERALS) tablet Take 1 tablet by mouth daily. With Diabetic Support    ? OneTouch Delica Lancets 68E MISC Use once daily to check glucose. 100 each 1  ? Probiotic Product (PROBIOTIC-10 PO) Take  1 capsule by mouth daily. Takes Fortify Women's Digestive Probiotic    ? rosuvastatin (CRESTOR) 5 MG tablet Take 1 tablet (5 mg total) by mouth daily. Start with every other day for 1 week and then once daily 90 tablet 3  ? tirzepatide (MOUNJARO) 10 MG/0.5ML Pen Inject 10 mg into the skin once a week. 2 mL 3  ? famotidine (PEPCID) 10 MG tablet Take 20 mg by mouth daily as needed for heartburn or indigestion. (Patient not taking: Reported on 09/10/2021)    ? ?No facility-administered medications prior to visit.  ? ? ?PAST MEDICAL HISTORY: ?Past Medical History:  ?Diagnosis Date  ? Abdominal pain   ? related to gallstones  ? Achilles tendonitis, bilateral   ? Bronchitis   ? 02/17  ? Diabetes ([place])   ? Type II  ? DKA (diabetic ketoacidoses) 08/25/2014  ? Gallstones   ? GERD (gastroesophageal reflux disease)   ? Headache   ? migraines  ? Hypertension   ? Lattice degeneration, both eyes   ? Pancreatitis   ? PCOS (polycystic ovarian syndrome)   ? Sinus congestion   ? occ. uses OTC meds for this  ? ? ?PAST SURGICAL HISTORY: ?Past Surgical History:  ?Procedure Laterality Date  ? CHOLECYSTECTOMY N/A 06/22/2013  ? Procedure: LAPAROSCOPIC CHOLECYSTECTOMY ;  Surgeon: Adin Hector, MD;  Location: [facility];  Service: General;  Laterality: N/A;  ? DILITATION & CURRETTAGE/HYSTROSCOPY  WITH HYDROTHERMAL ABLATION N/A 01/03/2016  ? Procedure: DILATATION & CURETTAGE/HYSTEROSCOPY WITH HYDROTHERMAL ABLATION;  Surgeon: Crawford Givens, MD;  Location: [facility];  Service: Gynecology;  Laterality: N/A;  HTA rep will be attend confirmed by office 12/12/15 ?  ? NASAL SEPTOPLASTY W/ TURBINOPLASTY Bilateral 01/11/2021  ? Procedure: NASAL SEPTOPLASTY WITH TURBINATE REDUCTION;  Surgeon: Jerrell Belfast, MD;  Location: [facility];  Service: ENT;  Laterality: Bilateral;  ? TONSILLECTOMY    ? ? ?FAMILY HISTORY: ?Family History  ?Problem Relation Age of Onset  ? Hypertension Mother   ? Diabetes Mother   ? Goiter Mother   ? Sleep apnea Mother   ? Hypertension  Father   ? Diabetes Father   ? Asthma Father   ? Diabetes Maternal Aunt   ? Lung cancer Paternal Grandmother   ?     smoker-heavy  ? Stroke Paternal Grandfather   ? Colon cancer Neg Hx   ? Heart disease Neg Hx   ? ? ?SOCIAL HISTORY: ?Social History  ? ?Socioeconomic History  ? Marital status: Single  ?  Spouse name: Not on file  ? Number of children: 0  ? Years of education: Not on file  ? Highest education level: Not on file  ?Occupational History  ? Occupation: research/scientist  ?  Employer: [employer]  ?Tobacco Use  ? Smoking status: Never  ? Smokeless tobacco: Never  ?Vaping Use  ? Vaping Use: Never used  ?Substance and Sexual Activity  ? Alcohol use: Not Currently  ?  Alcohol/week: 0.0 standard drinks  ?  Comment: 1 drink per month  ? Drug use: No  ? Sexual activity: Never  ?  Birth control/protection: None  ?Other Topics Concern  ? Not on file  ?Social History Narrative  ? Caffeine: bang energy drinks 300 mg/can, 1 can per day, maybe a 12 oz iced coffee  ? ?Social Determinants of Health  ? ?Financial Resource Strain: Not on file  ?Food Insecurity: Not on file  ?Transportation Needs: Not on file  ?Physical Activity: Not on file  ?Stress: Not on file  ?Social Connections: Not on file  ?Intimate Partner Violence: Not on file  ? ? ? ? ?PHYSICAL EXAM ? ?Vitals:  ? 01/15/22 1515  ?BP: 117/74  ?Pulse: 94  ?Weight: (!) 405 lb (183.7 kg)  ?Height: 5' 11"  (1.803 m)  ? ?Body mass index is 56.49 kg/m?. ? ?Generalized: Well developed, in no acute distress  ?Chest: Lungs clear to auscultation bilaterally ? ?Neurological examination  ?Mentation: Alert oriented to time, place, history taking. Follows all commands speech and language fluent ?Cranial nerve II-XII: Extraocular movements were full, visual field were full on confrontational test Head turning and shoulder shrug  were normal and symmetric. ?Gait and station: Gait is normal.  ? ? ?DIAGNOSTIC DATA (LABS, IMAGING, TESTING) ?- I reviewed patient records,  labs, notes, testing and imaging myself where available. ? ?Lab Results  ?Component Value Date  ? WBC 8.1 01/09/2021  ? HGB 14.2 01/09/2021  ? HCT 42.4 01/09/2021  ? MCV 88.0 01/09/2021  ? PLT 302.0 01/09/2021  ? ?   ?Component Value Date/Time  ? NA 138 12/23/2021 0912  ? K 4.1 12/23/2021 0912  ? CL 102 12/23/2021 0912  ? CO2 25 12/23/2021 0912  ? GLUCOSE 147 (H) 12/23/2021 0912  ? BUN 14 12/23/2021 0912  ? CREATININE 0.95 12/23/2021 0912  ? CREATININE 1.10 04/13/2013 1748  ? CALCIUM 10.4 12/23/2021 0912  ? PROT 7.2 12/23/2021 0912  ?  ALBUMIN 4.6 12/23/2021 0912  ? AST 20 12/23/2021 0912  ? ALT 23 12/23/2021 0912  ? ALKPHOS 83 12/23/2021 0912  ? BILITOT 0.3 12/23/2021 0912  ? GFRNONAA >60 06/19/2020 1810  ? GFRAA >60 06/19/2020 1810  ? ?Lab Results  ?Component Value Date  ? CHOL 203 (H) 12/23/2021  ? HDL 41.00 12/23/2021  ? LDLCALC 126 (H) 12/23/2021  ? LDLDIRECT 87.0 05/16/2019  ? TRIG 181.0 (H) 12/23/2021  ? CHOLHDL 5 12/23/2021  ? ?Lab Results  ?Component Value Date  ? HGBA1C 6.5 12/23/2021  ? ?No results found for: VITAMINB12 ?Lab Results  ?Component Value Date  ? TSH 2.29 02/20/2021  ? ? ? ? ?ASSESSMENT AND PLAN ?41 y.o. year old female  has a past medical history of Abdominal pain, Achilles tendonitis, bilateral, Bronchitis, Diabetes ([place]), DKA (diabetic ketoacidoses) (08/25/2014), Gallstones, GERD (gastroesophageal reflux disease), Headache, Hypertension, Lattice degeneration, both eyes, Pancreatitis, PCOS (polycystic ovarian syndrome), and Sinus congestion. here with: ? ?OSA on CPAP ? ?- CPAP compliance e suboptimal ?- Good treatment of AHI  ?-We will change pressure to AutoSet 5 to 10 cm of water ?- Encourage patient to use CPAP nightly and > 4 hours each night ?- F/U in 6 months or sooner if needed ? ? ?Ward Givens, MSN, NP-C 01/15/2022, 4:18 PM ?[facility] ?[address], [address] ?[place], [place] [address] ?[phone] ? ? ?

## 2022-01-16 ENCOUNTER — Ambulatory Visit: Payer: BC Managed Care – PPO | Admitting: Adult Health

## 2022-01-16 DIAGNOSIS — F4320 Adjustment disorder, unspecified: Secondary | ICD-10-CM | POA: Diagnosis not present

## 2022-01-20 DIAGNOSIS — G4733 Obstructive sleep apnea (adult) (pediatric): Secondary | ICD-10-CM | POA: Diagnosis not present

## 2022-01-23 ENCOUNTER — Encounter: Payer: Self-pay | Admitting: Endocrinology

## 2022-01-24 ENCOUNTER — Other Ambulatory Visit: Payer: Self-pay

## 2022-01-24 DIAGNOSIS — E1165 Type 2 diabetes mellitus with hyperglycemia: Secondary | ICD-10-CM

## 2022-01-24 MED ORDER — FREESTYLE LIBRE 2 SENSOR MISC: 3 refills | Status: DC

## 2022-02-06 ENCOUNTER — Encounter: Payer: Self-pay | Admitting: Endocrinology

## 2022-02-07 NOTE — Telephone Encounter (Signed)
Please make upcoming appt virtual. Ok per Dr Dwyane Dee ?

## 2022-02-10 ENCOUNTER — Other Ambulatory Visit (INDEPENDENT_AMBULATORY_CARE_PROVIDER_SITE_OTHER): Payer: BC Managed Care – PPO

## 2022-02-10 DIAGNOSIS — E782 Mixed hyperlipidemia: Secondary | ICD-10-CM

## 2022-02-10 DIAGNOSIS — E1165 Type 2 diabetes mellitus with hyperglycemia: Secondary | ICD-10-CM

## 2022-02-10 LAB — COMPREHENSIVE METABOLIC PANEL
ALT: 19 U/L (ref 0–35)
AST: 18 U/L (ref 0–37)
Albumin: 4.6 g/dL (ref 3.5–5.2)
Alkaline Phosphatase: 80 U/L (ref 39–117)
BUN: 14 mg/dL (ref 6–23)
CO2: 26 mEq/L (ref 19–32)
Calcium: 9.9 mg/dL (ref 8.4–10.5)
Chloride: 104 mEq/L (ref 96–112)
Creatinine, Ser: 0.94 mg/dL (ref 0.40–1.20)
GFR: 74.76 mL/min (ref >60.00)
Glucose, Bld: 132 mg/dL — ABNORMAL HIGH (ref 70–99)
Potassium: 4.3 mEq/L (ref 3.5–5.1)
Sodium: 136 mEq/L (ref 135–145)
Total Bilirubin: 0.4 mg/dL (ref 0.2–1.2)
Total Protein: 7.7 g/dL (ref 6.0–8.3)

## 2022-02-10 LAB — LIPID PANEL
Cholesterol: 125 mg/dL (ref 0–200)
HDL: 42.1 mg/dL (ref >39.00)
LDL Cholesterol: 63 mg/dL (ref 0–99)
NonHDL: 82.86
Total CHOL/HDL Ratio: 3
Triglycerides: 98 mg/dL (ref 0.0–149.0)
VLDL: 19.6 mg/dL (ref 0.0–40.0)

## 2022-02-11 LAB — FRUCTOSAMINE: Fructosamine: 248 umol/L (ref 0–285)

## 2022-02-13 ENCOUNTER — Telehealth (INDEPENDENT_AMBULATORY_CARE_PROVIDER_SITE_OTHER): Payer: BC Managed Care – PPO | Admitting: Endocrinology

## 2022-02-13 ENCOUNTER — Encounter: Payer: Self-pay | Admitting: Endocrinology

## 2022-02-13 VITALS — Ht 71.0 in | Wt 395.0 lb

## 2022-02-13 DIAGNOSIS — E1169 Type 2 diabetes mellitus with other specified complication: Secondary | ICD-10-CM | POA: Diagnosis not present

## 2022-02-13 DIAGNOSIS — E669 Obesity, unspecified: Secondary | ICD-10-CM | POA: Diagnosis not present

## 2022-02-13 DIAGNOSIS — I10 Essential (primary) hypertension: Secondary | ICD-10-CM | POA: Diagnosis not present

## 2022-02-13 DIAGNOSIS — E782 Mixed hyperlipidemia: Secondary | ICD-10-CM

## 2022-02-13 MED ORDER — FREESTYLE LIBRE 3 SENSOR MISC: 1.0000 | 2 refills | Status: DC

## 2022-02-13 NOTE — Progress Notes (Signed)
Patient ID: Angel Dawson, female   DOB: [birth year], 41 y.o.   MRN: [id number] ?       ? ? ?Reason for Appointment: Follow-up Type 2 Diabetes ? ?Referring physician: [name] [name] ? ?I connected with the above-named patient by video enabled telemedicine application and verified that I am speaking with the correct person. ?The patient was explained the limitations of evaluation and management by telemedicine and the availability of in person appointments.  ?Patient also understood that there may be a patient responsible charge related to this service ? Location of the patient: Patient's home ? Location of the provider: Physician office ?Only the patient and myself were participating in the encounter ?The patient understood the above statements and agreed to proceed. ? ? ?History of Present Illness:  ?        ?Date of diagnosis of type 2 diabetes mellitus:  2015     ? ?Background history:  ? ?At the time of diagnosis and 2015 she had extreme thirst and urination and was admitted to the hospital with blood sugars over 400 but no overt acidosis ?However she was discharged only on metformin 500 mg twice a day which she has continued ?Prior to that in 2014 she appears to have had mild hyperglycemia with blood sugars up to 195 ? ?Recent history:  ? ? ?Non-insulin hypoglycemic drugs the patient is taking are: Mounjaro 10 mg weekly, Jardiance 10 mg daily ? ?Her A1c is last relatively good at 6.5 ? ?Fructosamine is 248 compared to 266 last ? ?Current management, blood sugar patterns and problems identified: ?She has now taking 10 mg Jardiance along with her 10 mg Mounjaro ?This is because her fasting readings were in the 150s with Mounjaro alone ?With taking the Jardiance in addition her blood sugars are much better with fasting blood sugars averaging in the 120 range  ?She has also lost 10 pounds  ?This is despite only exercising about twice a week at home and not working out at the gym  ?She feels like she is still  watching her portions well ?No side effects from either medications  ?She is monitoring her blood sugars somewhat erratically with her sensor and has only 44% active CGM time recently ? ?Side effects from medications have been: Diarrhea from high dose metformin ? ?Glucose monitoring:  done less than 1 times a day         Glucometer: Freestyle libre ? ?Data as follows ? ?CGM use % of time 44  ?2-week average/GV 131  ?Time in range     95   %  ?% Time Above 180 4+1  ?% Time above 250   ?% Time Below 70   ? ?  ?PRE-MEAL Fasting Lunch Dinner Bedtime Overall  ?Glucose range:       ?Averages: 120      ? ?POST-MEAL PC Breakfast PC Lunch PC Dinner  ?Glucose range:     ?Averages: 132 149 136  ? ? ?PREVIOUSLY: ? ?CGM use % of time 92  ?2-week average/GV 166/21  ?Time in range    73    %  ?% Time Above 180 24  ?% Time above 250 3  ?% Time Below 70   ? ?  ?PRE-MEAL Fasting Lunch Dinner Bedtime Overall  ?Glucose range:       ?Averages: 157 157 163  166  ? ?POST-MEAL PC Breakfast PC Lunch PC Dinner  ?Glucose range:     ?Averages: 187 155 175  ? ? ? ?  Self-care:  ?Breakfast grits, bacon, sometimes omelette ?             ?Dietician visit, most recent: 05/2017, previously in classes ?              ? ?Weight history: ? ?Wt Readings from Last 3 Encounters:  ?02/13/22 (!) 395 lb (179.2 kg)  ?01/15/22 (!) 405 lb (183.7 kg)  ?12/26/21 (!) 408 lb 9.6 oz (185.3 kg)  ? ? ?Glycemic control:  ?  ?Lab Results  ?Component Value Date  ? HGBA1C 6.5 12/23/2021  ? HGBA1C 6.0 09/03/2021  ? HGBA1C 8.3 (H) 05/06/2021  ? ?Lab Results  ?Component Value Date  ? MICROALBUR 4.1 (H) 08/29/2021  ? Zachary 63 02/10/2022  ? CREATININE 0.94 02/10/2022  ? ?Lab Results  ?Component Value Date  ? MICRALBCREAT 4.7 08/29/2021  ? ? ?Lab Results  ?Component Value Date  ? FRUCTOSAMINE 248 02/10/2022  ? FRUCTOSAMINE 266 07/09/2021  ? FRUCTOSAMINE 269 12/27/2018  ? ?Lab on 02/10/2022  ?Component Date Value Ref Range Status  ? Cholesterol 02/10/2022 125  0 - 200 mg/dL Final   ? ATP III Classification       Desirable:  < 200 mg/dL               Borderline High:  200 - 239 mg/dL          High:  > = 240 mg/dL  ? Triglycerides 02/10/2022 98.0  0.0 - 149.0 mg/dL Final  ? Normal:  <150 mg/dLBorderline High:  150 - 199 mg/dL  ? HDL 02/10/2022 42.10  >39.00 mg/dL Final  ? VLDL 02/10/2022 19.6  0.0 - 40.0 mg/dL Final  ? LDL Cholesterol 02/10/2022 63  0 - 99 mg/dL Final  ? Total CHOL/HDL Ratio 02/10/2022 3   Final  ?                Men          Women1/2 Average Risk     3.4          3.3Average Risk          5.0          4.42X Average Risk          9.6          7.13X Average Risk          15.0          11.0                      ? NonHDL 02/10/2022 82.86   Final  ? NOTE:  Non-HDL goal should be 30 mg/dL higher than patient's LDL goal (i.e. LDL goal of < 70 mg/dL, would have non-HDL goal of < 100 mg/dL)  ? Fructosamine 02/10/2022 248  0 - 285 umol/L Final  ? Comment: Published reference interval for apparently healthy subjects ?between age 36 and 54 is 35 - 285 umol/L and in a poorly ?controlled diabetic population is 228 - 563 umol/L with a ?mean of 396 umol/L. ?  ? Sodium 02/10/2022 136  135 - 145 mEq/L Final  ? Potassium 02/10/2022 4.3  3.5 - 5.1 mEq/L Final  ? Chloride 02/10/2022 104  96 - 112 mEq/L Final  ? CO2 02/10/2022 26  19 - 32 mEq/L Final  ? Glucose, Bld 02/10/2022 132 (H)  70 - 99 mg/dL Final  ? BUN 02/10/2022 14  6 - 23 mg/dL Final  ? Creatinine, Ser 02/10/2022 0.94  0.40 - 1.20 mg/dL Final  ? Total Bilirubin 02/10/2022 0.4  0.2 - 1.2 mg/dL Final  ? Alkaline Phosphatase 02/10/2022 80  39 - 117 U/L Final  ? AST 02/10/2022 18  0 - 37 U/L Final  ? ALT 02/10/2022 19  0 - 35 U/L Final  ? Total Protein 02/10/2022 7.7  6.0 - 8.3 g/dL Final  ? Albumin 02/10/2022 4.6  3.5 - 5.2 g/dL Final  ? GFR 02/10/2022 74.76  >60.00 mL/min Final  ? Calculated using the CKD-EPI Creatinine Equation (2021)  ? Calcium 02/10/2022 9.9  8.4 - 10.5 mg/dL Final  ? ? ? ? ?Allergies as of 02/13/2022   ?No Known  Allergies ?  ? ?  ?Medication List  ?  ? ?  ? Accurate as of Feb 13, 2022 11:24 AM. If you have any questions, ask your nurse or doctor.  ?  ?  ? ?  ? ?Alpha-Lipoic Acid 300 MG Caps ?Take by mouth. ?  ?amLODipine 5 MG tablet ?Commonly known as: NORVASC ?Take 1 tablet (5 mg total) by mouth daily. ?  ?BENFOTIAMINE PO ?Take by mouth. ?  ?blood glucose meter kit and supplies ?Dispense based on patient and insurance preference. Use up to four times daily as directed. (FOR ICD-9 250.00, 250.01). ?  ?empagliflozin 10 MG Tabs tablet ?Commonly known as: Jardiance ?Take 1 tablet (10 mg total) by mouth daily with breakfast. ?  ?famotidine 10 MG tablet ?Commonly known as: PEPCID ?Take 20 mg by mouth daily as needed for heartburn or indigestion. ?  ?FreeStyle Libre 2 Sensor Misc ?APPLY EVERY 14 DAYS ?  ?lisinopril-hydrochlorothiazide 20-25 MG tablet ?Commonly known as: ZESTORETIC ?Take 1 tablet by mouth daily. ?  ?loratadine 10 MG tablet ?Commonly known as: CLARITIN ?Claritin 10 mg tablet ? Take 1 tablet every day by oral route. ?  ?multivitamin with minerals tablet ?Take 1 tablet by mouth daily. With Diabetic Support ?  ?OneTouch Delica Lancets 71G Misc ?Use once daily to check glucose. ?  ?OneTouch Verio test strip ?Generic drug: glucose blood ?USE TO TEST BLOOD SUGAR ONCE DAILY ?  ?PROBIOTIC-10 PO ?Take 1 capsule by mouth daily. Takes Fortify Women's Digestive Probiotic ?  ?rosuvastatin 5 MG tablet ?Commonly known as: Crestor ?Take 1 tablet (5 mg total) by mouth daily. Start with every other day for 1 week and then once daily ?  ?tirzepatide 10 MG/0.5ML Pen ?Commonly known as: MOUNJARO ?Inject 10 mg into the skin once a week. ?  ? ?  ? ? ?Allergies: No Known Allergies ? ?Past Medical History:  ?Diagnosis Date  ? Abdominal pain   ? related to gallstones  ? Achilles tendonitis, bilateral   ? Bronchitis   ? 02/17  ? Diabetes ([place])   ? Type II  ? DKA (diabetic ketoacidoses) 08/25/2014  ? Gallstones   ? GERD (gastroesophageal  reflux disease)   ? Headache   ? migraines  ? Hypertension   ? Lattice degeneration, both eyes   ? Pancreatitis   ? PCOS (polycystic ovarian syndrome)   ? Sinus congestion   ? occ. uses OTC meds for this  ? ? ?Past Su

## 2022-02-21 ENCOUNTER — Other Ambulatory Visit: Payer: Self-pay

## 2022-02-21 DIAGNOSIS — E1169 Type 2 diabetes mellitus with other specified complication: Secondary | ICD-10-CM

## 2022-02-21 MED ORDER — TIRZEPATIDE 10 MG/0.5ML ~~LOC~~ SOAJ: 10.0000 mg | SUBCUTANEOUS | 3 refills | Status: DC

## 2022-04-08 ENCOUNTER — Other Ambulatory Visit: Payer: Self-pay | Admitting: Family Medicine

## 2022-04-08 DIAGNOSIS — I10 Essential (primary) hypertension: Secondary | ICD-10-CM

## 2022-04-10 ENCOUNTER — Ambulatory Visit: Payer: BC Managed Care – PPO | Admitting: Physician Assistant

## 2022-04-10 ENCOUNTER — Telehealth: Payer: Self-pay | Admitting: Physician Assistant

## 2022-04-10 NOTE — Telephone Encounter (Signed)
Sent to wrong [place].

## 2022-04-10 NOTE — Telephone Encounter (Signed)
Called and spoke with patient, we have canceled her appointment for today. She will retest and reschedule when she is feeling better.

## 2022-04-11 DIAGNOSIS — G4733 Obstructive sleep apnea (adult) (pediatric): Secondary | ICD-10-CM | POA: Diagnosis not present

## 2022-04-15 ENCOUNTER — Other Ambulatory Visit: Payer: Self-pay | Admitting: Family Medicine

## 2022-04-15 DIAGNOSIS — I10 Essential (primary) hypertension: Secondary | ICD-10-CM

## 2022-04-17 ENCOUNTER — Other Ambulatory Visit: Payer: Self-pay | Admitting: Family Medicine

## 2022-04-17 DIAGNOSIS — I10 Essential (primary) hypertension: Secondary | ICD-10-CM

## 2022-04-18 ENCOUNTER — Other Ambulatory Visit: Payer: Self-pay

## 2022-04-18 ENCOUNTER — Telehealth: Payer: Self-pay | Admitting: Family Medicine

## 2022-04-18 DIAGNOSIS — I10 Essential (primary) hypertension: Secondary | ICD-10-CM

## 2022-04-18 MED ORDER — AMLODIPINE BESYLATE 5 MG PO TABS: 5.0000 mg | ORAL_TABLET | Freq: Every day | ORAL | 0 refills | Status: DC

## 2022-04-18 NOTE — Telephone Encounter (Signed)
Medication:   amLODipine (NORVASC) 5 MG tablet [[id number]]   Has the patient contacted their pharmacy? No. (If no, request that the patient contact the pharmacy for the refill.) (If yes, when and what did the pharmacy advise?)  Preferred Pharmacy (with phone number or street name):   [facility] [address] [place], [place] [address] P: [phone]  Agent: Please be advised that RX refills may take up to 3 business days. We ask that you follow-up with your pharmacy.

## 2022-04-18 NOTE — Telephone Encounter (Signed)
Refill sent.

## 2022-04-24 ENCOUNTER — Ambulatory Visit: Payer: BC Managed Care – PPO | Admitting: Adult Health

## 2022-05-02 ENCOUNTER — Other Ambulatory Visit: Payer: Self-pay | Admitting: Endocrinology

## 2022-05-11 DIAGNOSIS — G4733 Obstructive sleep apnea (adult) (pediatric): Secondary | ICD-10-CM | POA: Diagnosis not present

## 2022-06-05 ENCOUNTER — Other Ambulatory Visit: Payer: Self-pay | Admitting: Endocrinology

## 2022-06-05 ENCOUNTER — Other Ambulatory Visit: Payer: Self-pay | Admitting: Family Medicine

## 2022-06-05 DIAGNOSIS — I10 Essential (primary) hypertension: Secondary | ICD-10-CM

## 2022-06-11 DIAGNOSIS — G4733 Obstructive sleep apnea (adult) (pediatric): Secondary | ICD-10-CM | POA: Diagnosis not present

## 2022-07-10 DIAGNOSIS — M25511 Pain in right shoulder: Secondary | ICD-10-CM | POA: Diagnosis not present

## 2022-07-10 DIAGNOSIS — G4733 Obstructive sleep apnea (adult) (pediatric): Secondary | ICD-10-CM | POA: Diagnosis not present

## 2022-07-15 DIAGNOSIS — G4733 Obstructive sleep apnea (adult) (pediatric): Secondary | ICD-10-CM | POA: Diagnosis not present

## 2022-07-28 ENCOUNTER — Ambulatory Visit: Payer: BC Managed Care – PPO | Admitting: Adult Health

## 2022-08-09 DIAGNOSIS — G4733 Obstructive sleep apnea (adult) (pediatric): Secondary | ICD-10-CM | POA: Diagnosis not present

## 2022-08-11 ENCOUNTER — Encounter: Payer: Self-pay | Admitting: Neurology

## 2022-08-11 ENCOUNTER — Ambulatory Visit: Payer: BC Managed Care – PPO | Admitting: Neurology

## 2022-08-11 VITALS — BP 110/60 | HR 79 | Ht 72.0 in | Wt >= 6400 oz

## 2022-08-11 DIAGNOSIS — Z789 Other specified health status: Secondary | ICD-10-CM | POA: Diagnosis not present

## 2022-08-11 DIAGNOSIS — G4733 Obstructive sleep apnea (adult) (pediatric): Secondary | ICD-10-CM

## 2022-08-11 NOTE — Patient Instructions (Addendum)
It was nice to see you again today CPAP of 7 cm seems to control your sleep apnea really well and I would recommend you continue with the pressure setting. For mask fit issues, you can make an appointment with your DME provider and see if it different mask would fit better.  Keep up the good work with your excellent compliance!  Please continue close, you have lost some weight.  You can follow-up routinely in sleep clinic in 1 year, you can see Ward Givens, NP at the time.  If you would prefer, we can offer you a virtual visit through [place] video visit.

## 2022-08-11 NOTE — Progress Notes (Signed)
Subjective:    Patient ID: Angel Dawson is a 41 y.o. female.  HPI    Interim history:   Angel Dawson is a very pleasant 41 year old right-handed woman with an underlying medical history of bronchitis, diabetes, cholelithiasis, hypertension, pancreatitis, PCOS, reflux disease, and severe obesity with a BMI of over 57, who presents for follow-up consultation of her obstructive sleep apnea on AutoPap therapy.  The patient is unaccompanied today.  I saw her on 08/27/2021, at which time she was referred for sleep apnea management by her ENT specialist.  She had a home sleep test on 09/30/2021, which showed an AHI of 14.5/hour and O2 nadir of 85%.  She was advised to proceed with AutoPap therapy.  She had a machine from January 2019 and her pressure was adjusted.  She saw Vaughan Browner, NP on 01/15/2022, at which time she was suboptimal for more than 4 hours of PAP usage.  Her settings were changed to AutoPap of 5 to 10 cm.    Today, 08/11/2022: I reviewed her CPAP compliance data from 06/18/2022 through 07/17/22, which is a total of 30 days, during which time she used the machine every night with percent use days greater than 4 hours at 90%, indicating excellent compliance, average usage of 6 hours and 56 minutes, residual AHI at goal at 1.4/h, leak on the low side, pressure at 7 cm, currently on the same pressure as before.  She has a Therapist, nutritional.  She reports doing better with her CPAP.  She started noticing benefit from using it, better energy level and started being more consistent with it.  She uses a [employer] M/L Brevida nasal pillows interface but does have some leak from the mask and tends to tighten the strap which then leaves indentations but also sometimes the entire strap rides up to under her eyes and then puts pressure on her face.  She would like to explore different mask option.  She has overall done well, she has been able to lose weight but due to supply issues has not  been able to get her Mounjaro lately.  She has not been seen by weight management but would be interested in exploring this option, she sees her endocrinologist on a regular basis.  She is very motivated to continue with treatment and is pleased with her results at this point. I provided her with a nasal cushion sample mask from [employer]. She also would like a prescription for travel CPAP machine.  The patient's allergies, current medications, family history, past medical history, past social history, past surgical history and problem list were reviewed and updated as appropriate.   Previously:   08/27/21: (She) was previously diagnosed with obstructive sleep apnea and placed on CPAP therapy.  I had evaluated her for sleep apnea in 2018 at the request of her primary care.  She had a baseline sleep study on 07/01/2017 which showed a total AHI of 12.6/h, REM AHI of 25.4/h, supine AHI of 14.1/h, O2 nadir 86%.  She had a subsequent titration study on 08/17/2017, at which time she did well with CPAP of 7 cm.  She was started on home CPAP therapy.  I reviewed your office note from 06/10/2021.  She had inferior turbinate reduction as well as septoplasty on 01/11/2021.  She reports that she has done rather well after her surgery, she reports that her breathing is improved significantly, she feels that she does not snore any longer, friends used to tell her that she made  abnormal sounds like a sigh during sleep and she does not do that any longer.  She lives alone, she is single, no kids, no pets in the house, she works as a Passenger transport manager for [employer].  She does not have to go to [facility] every day and typically commutes 2 or 3 days out of the week.  She is trying to reduce her caffeine intake.  She generally drinks 1 energy drink per day. Her Epworth sleepiness score is 14 out of 24, fatigue severity score is 49 out of 63.  She does endorse daytime tiredness.  She overall feels that she is sleeping  better.  She is generally in bed around 930, as late as 10:30 PM rise time is 5:45 AM if she has to drive to [place].  She has been started on mounjaro some 3 months ago and is followed by endocrinology for her diabetes.  Her weight has fluctuated.  She would like to be re-evaluated for sleep apnea. In the past year, she has used her CPAP only about 10 days, she did try using it most recently in August for a few days.  She feels that the area is quite strong and her breathing overall is better, she feels like she is sleeping better at night.  She denies recurrent morning headaches or night to night nocturia.   She saw Cecille Rubin, NP, on 12/17/2017, at which time she was slightly suboptimal with her CPAP therapy.  She was advised to follow-up in about 3 months.  She has not been seen in this clinic since then.     05/26/17: 41 year old right-handed woman with an underlying medical history of diabetes, history of diabetic ketoacidosis, history of bronchitis last year, reflux disease, hypertension, recurrent headache, PCO S, history of pancreatitis, and morbid obesity, who had a home sleep test about 2-1/2 years ago which showed borderline obstructive sleep apnea with an AHI of 4.4 per hour, average oxygen saturation of 94%, nadir of 83%. Her BMI was 57.6 at the time, weight was 413 pounds. Her current weight is about the same, 412 lb. I reviewed your office note from 04/08/2017. She also reports having had an actual attended sleep study some 10 years ago with inconclusive findings as she was not able to sleep very much. This was done at [facility], she was not able to sleep on her back at the time and remembers that she did not sleep and off. She does sleep mostly on her sides and sometimes on her stomach. She is able to sleep on her back some. She lives alone, she is single, no children, works at [employer] and has a long commute. She has to drive for about an hour and she lives in [place]. She gets sleepy at the  wheel. This has concerned her. She has recently shared a bedroom with a girlfriend of hers and has specifically asked her about snoring and she does not snore very much, makes quiet noises sometimes like a sigh. She has vivid dreams. She dreams during naps as well. She has extended naps sometimes on weekends, has slept for hours at a time. She has experienced intermittent sleep paralysis for years, dating back 20 years even. She recalls being sleepy as a child even. She had her tonsils out when she was 23 and had multiple strep throat episodes with time. She is trying to lose weight and make some lifestyle changes to help her diabetes and her weight. She has given up caffeine. She  drinks alcohol very occasionally, maybe once a month or so. She's a nonsmoker. Bedtime is around 10 PM and wakeup time typically without an alarm is around 5 or 5:15 AM but by 11 AM she becomes really sleepy and often will try to nap during her lunch hour. She has to be at work at 68. She leaves the house around 5:45 AM. She has had intermittent restless leg symptoms. She has had leg cramps at night as well. She has woken up with a headache particularly after an extended nap, not so much in the morning, denies nocturia. Mom has sleep apnea, dad is also sleepy during the day but has not been formally diagnosed with any sleep condition. She reports that no matter how long she sleeps she does not wake up rested, she feels that her sleep is poor quality, has woken herself up with a sense of gasping for air but has not been witnessed to have apneas. Epworth sleepiness score is 19 out of 24 today, fatigue score is 58 out of 63. She denies symptoms of depression.    Her Past Medical History Is Significant For: Past Medical History:  Diagnosis Date   Abdominal pain    related to gallstones   Achilles tendonitis, bilateral    Bronchitis    02/17   Diabetes ([place])    Type II   DKA (diabetic ketoacidoses) 08/25/2014   Gallstones    GERD  (gastroesophageal reflux disease)    Headache    migraines   Hypertension    Lattice degeneration, both eyes    Pancreatitis    PCOS (polycystic ovarian syndrome)    Sinus congestion    occ. uses OTC meds for this    Her Past Surgical History Is Significant For: Past Surgical History:  Procedure Laterality Date   CHOLECYSTECTOMY N/A 06/22/2013   Procedure: LAPAROSCOPIC CHOLECYSTECTOMY ;  Surgeon: Adin Hector, MD;  Location: [facility];  Service: General;  Laterality: N/A;   DILITATION & CURRETTAGE/HYSTROSCOPY WITH HYDROTHERMAL ABLATION N/A 01/03/2016   Procedure: DILATATION & CURETTAGE/HYSTEROSCOPY WITH HYDROTHERMAL ABLATION;  Surgeon: Crawford Givens, MD;  Location: [facility];  Service: Gynecology;  Laterality: N/A;  HTA rep will be attend confirmed by office 12/12/15    NASAL SEPTOPLASTY W/ TURBINOPLASTY Bilateral 01/11/2021   Procedure: NASAL SEPTOPLASTY WITH TURBINATE REDUCTION;  Surgeon: Jerrell Belfast, MD;  Location: [facility];  Service: ENT;  Laterality: Bilateral;   TONSILLECTOMY      Her Family History Is Significant For: Family History  Problem Relation Age of Onset   Hypertension Mother    Diabetes Mother    Goiter Mother    Sleep apnea Mother    Hypertension Father    Diabetes Father    Asthma Father    Diabetes Maternal Aunt    Lung cancer Paternal Grandmother        smoker-heavy   Stroke Paternal Grandfather    Colon cancer Neg Hx    Heart disease Neg Hx     Her Social History Is Significant For: Social History   Socioeconomic History   Marital status: Single    Spouse name: Not on file   Number of children: 0   Years of education: Not on file   Highest education level: Not on file  Occupational History   Occupation: Animal nutritionist: [employer]  Tobacco Use   Smoking status: Never   Smokeless tobacco: Never  Vaping Use   Vaping Use: Never used  Substance and Sexual  Activity   Alcohol use: Not Currently    Alcohol/week: 0.0  standard drinks of alcohol    Comment: 1 drink per month   Drug use: No   Sexual activity: Never    Birth control/protection: None  Other Topics Concern   Not on file  Social History Narrative   Caffeine: bang energy drinks 300 mg/can, 1 can per day, maybe a 12 oz iced coffee   Social Determinants of Health   Financial Resource Strain: Not on file  Food Insecurity: Not on file  Transportation Needs: Not on file  Physical Activity: Not on file  Stress: Not on file  Social Connections: Not on file    Her Allergies Are:  No Known Allergies:   Her Current Medications Are:  Outpatient Encounter Medications as of 08/11/2022  Medication Sig   Alpha-Lipoic Acid 300 MG CAPS Take by mouth.   amLODipine (NORVASC) 5 MG tablet Take 1 tablet (5 mg total) by mouth daily.   BENFOTIAMINE PO Take by mouth.   Blood Glucose Monitoring Suppl (BLOOD GLUCOSE METER KIT AND SUPPLIES) Dispense based on patient and insurance preference. Use up to four times daily as directed. (FOR ICD-9 250.00, 250.01).   Continuous Blood Gluc Sensor (FREESTYLE LIBRE 3 SENSOR) MISC APPLY SENSOR ON UPPER ARM EVERY 14 DAYS FOR CONTINUOUS GLUCOSE MONITORING   glucose blood (ONETOUCH VERIO) test strip USE TO TEST BLOOD SUGAR ONCE DAILY   JARDIANCE 10 MG TABS tablet TAKE 1 TABLET(10 MG) BY MOUTH DAILY WITH BREAKFAST   lisinopril-hydrochlorothiazide (ZESTORETIC) 20-25 MG tablet Take 1 tablet by mouth daily.   loratadine (CLARITIN) 10 MG tablet Claritin 10 mg tablet  Take 1 tablet every day by oral route.   Multiple Vitamins-Minerals (MULTIVITAMIN WITH MINERALS) tablet Take 1 tablet by mouth daily. With Diabetic Support   OneTouch Delica Lancets 41P MISC Use once daily to check glucose.   Probiotic Product (PROBIOTIC-10 PO) Take 1 capsule by mouth daily. Takes Fortify Women's Digestive Probiotic   rosuvastatin (CRESTOR) 5 MG tablet Take 1 tablet (5 mg total) by mouth daily. Start with every other day for 1 week and then once  daily   famotidine (PEPCID) 10 MG tablet Take 20 mg by mouth daily as needed for heartburn or indigestion.   tirzepatide (MOUNJARO) 10 MG/0.5ML Pen Inject 10 mg into the skin once a week.   No facility-administered encounter medications on file as of 08/11/2022.  :  Review of Systems:  Out of a complete 14 point review of systems, all are reviewed and negative with the exception of these symptoms as listed below:  Review of Systems  Neurological:        Pt here for CPAP f/u Pt states mask doesn't fit right Pt states she has indention's in face in am . Pt states her mask moves a lot at night     ESS:11    Objective:  Neurological Exam  Physical Exam Physical Examination:   Vitals:   08/11/22 0729  BP: 110/60  Pulse: 79    General Examination: The patient is a very pleasant 41 y.o. female in no acute distress. She appears well-developed and well-nourished and well groomed.   HEENT: Normocephalic, atraumatic, pupils are equal, round and reactive to light, extraocular tracking is well-preserved.  Hearing is grossly intact. Face is symmetric with normal facial animation and normal facial sensation. Speech is clear with no dysarthria noted. There is no hypophonia. There is no lip, neck/head, jaw or voice tremor. Neck is supple with  FROM, no carotid bruits on auscultation. Oropharynx exam reveals: mild mouth dryness, good dental hygiene and moderate airway crowding secondary to larger tongue, smaller airway entry, tonsils are absent. Mallampati is class III. Tongue protrudes centrally and palate elevates symmetrically.  Nasal inspection reveals no septal deviation and no significant inferior turbinate hypertrophy.   Chest: Clear to auscultation without wheezing, rhonchi or crackles noted.   Heart: S1+S2+0, regular and normal without murmurs, rubs or gallops noted.    Abdomen: Soft, non-tender and non-distended.   Extremities: There is no obvious edema.   Skin: Warm and dry without  trophic changes noted.   Musculoskeletal: exam reveals no obvious joint deformities.    Neurologically:  Mental status: The patient is awake, alert and oriented in all 4 spheres. Her immediate and remote memory, attention, language skills and fund of knowledge are appropriate. There is no evidence of aphasia, agnosia, apraxia or anomia. Speech is clear with normal prosody and enunciation. Thought process is linear. Mood is normal and affect is normal.  Cranial nerves II - XII are as described above under HEENT exam. Motor exam: Normal bulk, strength and tone is noted. There is no tremor. Fine motor skills and coordination: Grossly intact.    Cerebellar testing: No dysmetria or intention tremor. There is no truncal or gait ataxia.  Sensory exam: intact to light touch in the upper and lower extremities.  Gait, station and balance: She stands easily. No veering to one side is noted. No leaning to one side is noted. Posture is age-appropriate and stance is narrow based. Gait shows normal stride length and normal pace. No problems turning are noted.   Assessment and plan:  In summary, Angel Dawson is a very pleasant 41 year old right-handed woman with an underlying medical history of bronchitis, diabetes, cholelithiasis, hypertension, pancreatitis, PCOS, reflux disease, and severe obesity with a BMI of over 42, who presents for follow-up consultation of her obstructive sleep apnea.  She had home sleep testing in December 2022.  She had septoplasty in April 2022.  She is compliant with her CPAP of 7 cm and continues to benefit from it.  She is motivated to continue with treatment.  I was able to provide her with a sample interface from [facility], nasal cushion starter pack and she is interested in trying this.  If she likes it, she can reorder through her DME provider.  She is commended for her treatment adherence.  She would like to explore also the option of getting a travel CPAP machine, I  provided her with a printed prescription for this so she can use it at her leisure.  She is encouraged to talk to her primary care about a referral to medical weight management, this may be a good option for her.  She is working on weight loss and has lost a little bit of weight.  She is currently not on Mounjaro.  She is advised to follow-up routinely to see one of our nurse practitioners in sleep clinic in 1 year.  We can offer a MyChart video visit if she prefers.  I answered all her questions today and she was in agreement.   I spent 35 minutes in total face-to-face time and in reviewing records during pre-charting, more than 50% of which was spent in counseling and coordination of care, reviewing test results, reviewing medications and treatment regimen and/or in discussing or reviewing the diagnosis of OSA, the prognosis and treatment options. Pertinent laboratory and imaging test results that were available during  this visit with the patient were reviewed by me and considered in my medical decision making (see chart for details).

## 2022-08-13 ENCOUNTER — Encounter: Payer: BC Managed Care – PPO | Admitting: Family Medicine

## 2022-08-18 NOTE — Patient Instructions (Incomplete)
Good to see you again today- I will be in touch with your labs asap Your BP is high but not terrible despite being off your medications- let's stop amlodipine and decrease lisinopril/ hctz to the 10/12.5 strength  Please let me now how your BP responds to this  We will stop Mounjaro and try Trulicity for now- let me now if you are able to get this filled and how you respond.  We will start the lowest dose and go up as needed  Watch for any sign of pancreatitis- belly pain most common!  Schedule a CT coronary at your convenience

## 2022-08-18 NOTE — Progress Notes (Unsigned)
[place] at [facility] [address] [place], [place] [address] [phone]  Date:  08/20/2022   Name:  Angel Dawson   DOB:  [birth year]   MRN:  [id number]  PCP:  Darreld Mclean, MD    Chief Complaint: No chief complaint on file.   History of Present Illness:  Angel Dawson is a 41 y.o. very pleasant female patient who presents with the following:  Pt seen today for a CPE Last seen by myself about on year ago -  history of DM, OSA, HTN, obesity, gallstone pancreatitis s/p cholecystectomy   Her endocrine is Dr Dwyane Dee - seen in May  Lab Results  Component Value Date   HGBA1C 6.5 12/23/2021    Eye exam Foot exam can be updated Mammo Pap smear Flu shot Can update A1c  Amlodipine Jardiance Zestoretic Crestor Mounjaro    Patient Active Problem List   Diagnosis Date Noted   Deviated septum 01/11/2021   Obstructive sleep apnea treated with continuous positive airway pressure (CPAP) 12/29/2017   Onychomycosis 10/26/2014   Gastroesophageal reflux disease without esophagitis 09/28/2014   Diabetes mellitus type II, uncontrolled 09/28/2014   Hypertension 08/25/2014   Morbid obesity ([place]) 08/25/2014   Chronic rhinitis 06/30/2014   Pancreatitis, acute 07/02/2013   Gallstones 04/13/2013    Past Medical History:  Diagnosis Date   Abdominal pain    related to gallstones   Achilles tendonitis, bilateral    Bronchitis    02/17   Diabetes ([place])    Type II   DKA (diabetic ketoacidoses) 08/25/2014   Gallstones    GERD (gastroesophageal reflux disease)    Headache    migraines   Hypertension    Lattice degeneration, both eyes    Pancreatitis    PCOS (polycystic ovarian syndrome)    Sinus congestion    occ. uses OTC meds for this    Past Surgical History:  Procedure Laterality Date   CHOLECYSTECTOMY N/A 06/22/2013   Procedure: LAPAROSCOPIC CHOLECYSTECTOMY ;  Surgeon: Adin Hector, MD;  Location: [facility];  Service: General;  Laterality: N/A;   DILITATION & CURRETTAGE/HYSTROSCOPY WITH HYDROTHERMAL ABLATION N/A 01/03/2016   Procedure: DILATATION & CURETTAGE/HYSTEROSCOPY WITH HYDROTHERMAL ABLATION;  Surgeon: Crawford Givens, MD;  Location: [facility];  Service: Gynecology;  Laterality: N/A;  HTA rep will be attend confirmed by office 12/12/15    NASAL SEPTOPLASTY W/ TURBINOPLASTY Bilateral 01/11/2021   Procedure: NASAL SEPTOPLASTY WITH TURBINATE REDUCTION;  Surgeon: Jerrell Belfast, MD;  Location: [facility];  Service: ENT;  Laterality: Bilateral;   TONSILLECTOMY      Social History   Tobacco Use   Smoking status: Never   Smokeless tobacco: Never  Vaping Use   Vaping Use: Never used  Substance Use Topics   Alcohol use: Not Currently    Alcohol/week: 0.0 standard drinks of alcohol    Comment: 1 drink per month   Drug use: No    Family History  Problem Relation Age of Onset   Hypertension Mother    Diabetes Mother    Goiter Mother    Sleep apnea Mother    Hypertension Father    Diabetes Father    Asthma Father    Diabetes Maternal Aunt    Lung cancer Paternal Grandmother        smoker-heavy   Stroke Paternal Grandfather    Colon cancer Neg Hx    Heart disease Neg Hx  No Known Allergies  Medication list has been reviewed and updated.  Current Outpatient Medications on File Prior to Visit  Medication Sig Dispense Refill   Alpha-Lipoic Acid 300 MG CAPS Take by mouth.     amLODipine (NORVASC) 5 MG tablet Take 1 tablet (5 mg total) by mouth daily. 30 tablet 0   BENFOTIAMINE PO Take by mouth.     Blood Glucose Monitoring Suppl (BLOOD GLUCOSE METER KIT AND SUPPLIES) Dispense based on patient and insurance preference. Use up to four times daily as directed. (FOR ICD-9 250.00, 250.01). 1 each 0   Continuous Blood Gluc Sensor (FREESTYLE LIBRE 3 SENSOR) MISC APPLY SENSOR ON UPPER ARM EVERY 14 DAYS FOR CONTINUOUS GLUCOSE MONITORING 2 each 2   famotidine (PEPCID) 10 MG tablet Take 20 mg by  mouth daily as needed for heartburn or indigestion.     glucose blood (ONETOUCH VERIO) test strip USE TO TEST BLOOD SUGAR ONCE DAILY 100 strip 3   JARDIANCE 10 MG TABS tablet TAKE 1 TABLET(10 MG) BY MOUTH DAILY WITH BREAKFAST 30 tablet 3   lisinopril-hydrochlorothiazide (ZESTORETIC) 20-25 MG tablet Take 1 tablet by mouth daily. 30 tablet 0   loratadine (CLARITIN) 10 MG tablet Claritin 10 mg tablet  Take 1 tablet every day by oral route.     Multiple Vitamins-Minerals (MULTIVITAMIN WITH MINERALS) tablet Take 1 tablet by mouth daily. With Diabetic Support     OneTouch Delica Lancets 03U MISC Use once daily to check glucose. 100 each 1   Probiotic Product (PROBIOTIC-10 PO) Take 1 capsule by mouth daily. Takes Fortify Women's Digestive Probiotic     rosuvastatin (CRESTOR) 5 MG tablet Take 1 tablet (5 mg total) by mouth daily. Start with every other day for 1 week and then once daily 90 tablet 3   tirzepatide (MOUNJARO) 10 MG/0.5ML Pen Inject 10 mg into the skin once a week. 2 mL 3   No current facility-administered medications on file prior to visit.    Review of Systems:  As per HPI- otherwise negative.   Physical Examination: There were no vitals filed for this visit. There were no vitals filed for this visit. There is no height or weight on file to calculate BMI. Ideal Body Weight:    GEN: no acute distress. HEENT: Atraumatic, Normocephalic.  Ears and Nose: No external deformity. CV: RRR, No M/G/R. No JVD. No thrill. No extra heart sounds. PULM: CTA B, no wheezes, crackles, rhonchi. No retractions. No resp. distress. No accessory muscle use. ABD: S, NT, ND, +BS. No rebound. No HSM. EXTR: No c/c/e PSYCH: Normally interactive. Conversant.  Foot exam  Assessment and Plan: *** Physical exam today- encouraged healthy diet and exercise routine  Will plan further follow- up pending labs.  Signed Lamar Blinks, MD

## 2022-08-19 ENCOUNTER — Ambulatory Visit (INDEPENDENT_AMBULATORY_CARE_PROVIDER_SITE_OTHER): Payer: BC Managed Care – PPO | Admitting: Family Medicine

## 2022-08-19 ENCOUNTER — Encounter (INDEPENDENT_AMBULATORY_CARE_PROVIDER_SITE_OTHER): Payer: Self-pay | Admitting: Family Medicine

## 2022-08-19 VITALS — BP 152/96 | HR 77 | Temp 98.3°F | Ht 71.0 in | Wt >= 6400 oz

## 2022-08-19 DIAGNOSIS — I10 Essential (primary) hypertension: Secondary | ICD-10-CM

## 2022-08-19 DIAGNOSIS — E669 Obesity, unspecified: Secondary | ICD-10-CM

## 2022-08-19 DIAGNOSIS — Z6841 Body Mass Index (BMI) 40.0 and over, adult: Secondary | ICD-10-CM

## 2022-08-19 DIAGNOSIS — E1169 Type 2 diabetes mellitus with other specified complication: Secondary | ICD-10-CM

## 2022-08-19 DIAGNOSIS — Z7985 Long-term (current) use of injectable non-insulin antidiabetic drugs: Secondary | ICD-10-CM

## 2022-08-19 DIAGNOSIS — G4733 Obstructive sleep apnea (adult) (pediatric): Secondary | ICD-10-CM

## 2022-08-20 ENCOUNTER — Encounter: Payer: Self-pay | Admitting: Family Medicine

## 2022-08-20 ENCOUNTER — Ambulatory Visit (INDEPENDENT_AMBULATORY_CARE_PROVIDER_SITE_OTHER): Payer: BC Managed Care – PPO | Admitting: Family Medicine

## 2022-08-20 VITALS — BP 146/80 | HR 82 | Temp 97.6°F | Resp 18 | Ht 71.0 in | Wt >= 6400 oz

## 2022-08-20 DIAGNOSIS — G4733 Obstructive sleep apnea (adult) (pediatric): Secondary | ICD-10-CM

## 2022-08-20 DIAGNOSIS — I10 Essential (primary) hypertension: Secondary | ICD-10-CM | POA: Diagnosis not present

## 2022-08-20 DIAGNOSIS — Z0000 Encounter for general adult medical examination without abnormal findings: Secondary | ICD-10-CM

## 2022-08-20 DIAGNOSIS — E1169 Type 2 diabetes mellitus with other specified complication: Secondary | ICD-10-CM

## 2022-08-20 LAB — TSH: TSH: 1.4 u[IU]/mL (ref 0.35–5.50)

## 2022-08-20 LAB — HEMOGLOBIN A1C: Hgb A1c MFr Bld: 6.5 % (ref 4.6–6.5)

## 2022-08-20 LAB — CBC
HCT: 40.8 % (ref 36.0–46.0)
Hemoglobin: 13.3 g/dL (ref 12.0–15.0)
MCHC: 32.7 g/dL (ref 30.0–36.0)
MCV: 90.1 fl (ref 78.0–100.0)
Platelets: 293 10*3/uL (ref 150.0–400.0)
RBC: 4.53 Mil/uL (ref 3.87–5.11)
RDW: 14.5 % (ref 11.5–15.5)
WBC: 7.8 10*3/uL (ref 4.0–10.5)

## 2022-08-20 LAB — BASIC METABOLIC PANEL
BUN: 13 mg/dL (ref 6–23)
CO2: 28 mEq/L (ref 19–32)
Calcium: 9.6 mg/dL (ref 8.4–10.5)
Chloride: 105 mEq/L (ref 96–112)
Creatinine, Ser: 0.9 mg/dL (ref 0.40–1.20)
GFR: 78.48 mL/min (ref >60.00)
Glucose, Bld: 92 mg/dL (ref 70–99)
Potassium: 4.1 mEq/L (ref 3.5–5.1)
Sodium: 138 mEq/L (ref 135–145)

## 2022-08-20 MED ORDER — FREESTYLE LIBRE 3 SENSOR MISC: 3 refills | Status: AC

## 2022-08-20 MED ORDER — TRULICITY 0.75 MG/0.5ML ~~LOC~~ SOAJ: 0.7500 mg | SUBCUTANEOUS | 2 refills | Status: DC

## 2022-08-20 MED ORDER — EMPAGLIFLOZIN 10 MG PO TABS
ORAL_TABLET | ORAL | 3 refills | Status: DC
Start: 2022-08-20 — End: 2023-08-27

## 2022-08-20 MED ORDER — LISINOPRIL-HYDROCHLOROTHIAZIDE 10-12.5 MG PO TABS: 1.0000 | ORAL_TABLET | Freq: Every day | ORAL | 3 refills | Status: DC

## 2022-08-20 MED ORDER — ROSUVASTATIN CALCIUM 5 MG PO TABS: 5.0000 mg | ORAL_TABLET | Freq: Every day | ORAL | 3 refills | Status: DC

## 2022-08-26 DIAGNOSIS — F4320 Adjustment disorder, unspecified: Secondary | ICD-10-CM | POA: Diagnosis not present

## 2022-08-28 NOTE — Progress Notes (Signed)
Office: [phone]  /  Fax: [phone]   Initial Visit  Angel Dawson was seen in clinic today to evaluate for obesity. She is interested in losing weight to improve overall health and reduce the risk of weight related complications. She presents today to review program treatment options, initial physical assessment, and evaluation.     She was referred by: Specialist  When asked what else they would like to accomplish? She states: Adopt healthier eating patterns, Improve quality of life, and Lose a target amount of weight : about 100 lbs  When asked how has your weight affected you? She states: Contributed to orthopedic problems or mobility issues and Having fatigue  Some associated conditions: Hypertension, OSA, T2DM, and PCOS  Contributing factors: Family history and Other: Endometrial ablations.   Weight promoting medications identified: None  Current nutrition plan: None  Current level of physical activity: Step counting  Current or previous pharmacotherapy: GLP-1 and GLP-1 + GIP Mounjaro  Response to medication: Never tried medications  Past medical history includes:   Past Medical History:  Diagnosis Date   Abdominal pain    related to gallstones   Achilles tendonitis, bilateral    Bronchitis    02/17   Diabetes ([place])    Type II   DKA (diabetic ketoacidoses) 08/25/2014   Gallstones    GERD (gastroesophageal reflux disease)    Headache    migraines   Hypertension    Lattice degeneration, both eyes    Pancreatitis    PCOS (polycystic ovarian syndrome)    Sinus congestion    occ. uses OTC meds for this   Objective:   BP (!) 152/96   Pulse 77   Temp 98.3 F (36.8 C)   Ht '5\' 11"'$  (1.803 m)   Wt (!) 406 lb (184.2 kg)   SpO2 99%   BMI 56.63 kg/m  She was weighed on the bioimpedance scale: Body mass index is 56.63 kg/m. Visceral Fat Rating:22, Body Fat%:55.1  General:  Alert, oriented and cooperative. Patient is in no acute distress.   Respiratory: Normal respiratory effort, no problems with respiration noted  Extremities: Normal range of motion.    Mental Status: Normal mood and affect. Normal behavior. Normal judgment and thought content.   Assessment and Plan:  1. Type 2 diabetes mellitus with other specified complication, unspecified whether long term insulin use (HCC) A1c was updated yesterday, pending results.  She is off Mounjaro due to shortages on Jardiance.  History of metformin use without problems.  She does have a history of pancreatitis.   Plan to work on healthy lifestyle changes, weight reduction and follow up with endocrinology.   2. Essential hypertension Blood pressure elevated today on Norvasc 5 mg daily, lisinopril/HCTZ, 20/25 daily per PCP.   Look for blood pressure improvements with >10% TBW loss.   3. OSA on CPAP Compliant with CPAP nightly.   Aim for 7-8 hours of sleep with CPAP.   4. Obesity,current BMI 56.7 1) Reviewed obesity as a disease. 2) Reviewed information about our program. 3) Reviewed today's Bioimpedance results.  4) Follow up with Endocrinology now that she is off Mounjaro due to shortages.    We reviewed weight, biometrics, associated medical conditions and contributing factors with patient. She would benefit from weight loss therapy via a modified calorie, low-carb, high-protein nutritional plan tailored to their REE (resting energy expenditure) which will be determined by indirect calorimetry.  We will also assess for cardiometabolic risk and nutritional derangements via fasting serologies at  her next appointment.     Obesity Treatment / Action Plan:  Will be scheduled for indirect calorimetry to determine resting energy expenditure in a fasting state.  This will allow [place] to create a reduced calorie, high-protein meal plan to promote loss of fat mass while preserving muscle mass. Was counseled on nutritional approaches to weight loss and benefits of complex carbs and high  quality protein as part of nutritional weight management. Was counseled on pharmacotherapy and role as an adjunct in weight management.   Obesity Education Performed Today:  She was weighed on the bioimpedance scale and results were discussed and documented in the synopsis.  We discussed obesity as a disease and the importance of a more detailed evaluation of all the factors contributing to the disease.  We discussed the importance of long term lifestyle changes which include nutrition, exercise and behavioral modifications as well as the importance of customizing this to her specific health and social needs.  We discussed the benefits of reaching a healthier weight to alleviate the symptoms of existing conditions and reduce the risks of the biomechanical, metabolic and psychological effects of obesity.  Angel Dawson appears to be in the action stage of change and states they are ready to start intensive lifestyle modifications and behavioral modifications.  30 minutes was spent today on this visit including the above counseling, pre-visit chart review, and post-visit documentation.  Reviewed by clinician on day of visit: allergies, medications, problem list, medical history, surgical history, family history, social history, and previous encounter notes.  I, Davy Pique, am acting as Location manager for Loyal Gambler, DO.   I have reviewed the above documentation for accuracy and completeness, and I agree with the above. Dell Ponto, DO

## 2022-09-01 ENCOUNTER — Telehealth: Payer: BC Managed Care – PPO | Admitting: Family Medicine

## 2022-09-01 ENCOUNTER — Encounter: Payer: Self-pay | Admitting: Adult Health

## 2022-09-01 DIAGNOSIS — J019 Acute sinusitis, unspecified: Secondary | ICD-10-CM | POA: Diagnosis not present

## 2022-09-01 DIAGNOSIS — B9689 Other specified bacterial agents as the cause of diseases classified elsewhere: Secondary | ICD-10-CM | POA: Diagnosis not present

## 2022-09-01 DIAGNOSIS — R051 Acute cough: Secondary | ICD-10-CM | POA: Diagnosis not present

## 2022-09-01 MED ORDER — PREDNISONE 20 MG PO TABS: 40.0000 mg | ORAL_TABLET | Freq: Every day | ORAL | 0 refills | Status: AC

## 2022-09-01 MED ORDER — ALBUTEROL SULFATE HFA 108 (90 BASE) MCG/ACT IN AERS: 1.0000 | INHALATION_SPRAY | Freq: Four times a day (QID) | RESPIRATORY_TRACT | 0 refills | Status: DC | PRN

## 2022-09-01 MED ORDER — DOXYCYCLINE HYCLATE 100 MG PO TABS: 100.0000 mg | ORAL_TABLET | Freq: Two times a day (BID) | ORAL | 0 refills | Status: AC

## 2022-09-01 NOTE — Patient Instructions (Signed)
Angel Dawson, thank you for joining Perlie Mayo, NP for today's virtual visit.  While this provider is not your primary care provider (PCP), if your PCP is located in our provider database this encounter information will be shared with them immediately following your visit.   [facility] account gives you access to today's visit and all your visits, tests, and labs performed at [facility] " click here if you don't have a [facility] account or go to [email or web address]  Consent: (Patient) Angel Dawson provided verbal consent for this virtual visit at the beginning of the encounter.  Current Medications:  Current Outpatient Medications:    albuterol (VENTOLIN HFA) 108 (90 Base) MCG/ACT inhaler, Inhale 1-2 puffs into the lungs every 6 (six) hours as needed for wheezing or shortness of breath., Disp: 8 g, Rfl: 0   doxycycline (VIBRA-TABS) 100 MG tablet, Take 1 tablet (100 mg total) by mouth 2 (two) times daily for 10 days., Disp: 20 tablet, Rfl: 0   predniSONE (DELTASONE) 20 MG tablet, Take 2 tablets (40 mg total) by mouth daily with breakfast for 3 days., Disp: 6 tablet, Rfl: 0   amLODipine (NORVASC) 5 MG tablet, Take 1 tablet (5 mg total) by mouth daily., Disp: 30 tablet, Rfl: 0   Blood Glucose Monitoring Suppl (BLOOD GLUCOSE METER KIT AND SUPPLIES), Dispense based on patient and insurance preference. Use up to four times daily as directed. (FOR ICD-9 250.00, 250.01)., Disp: 1 each, Rfl: 0   Cholecalciferol 1.25 MG (50000 UT) capsule, , Disp: , Rfl:    Continuous Blood Gluc Sensor (FREESTYLE LIBRE 3 SENSOR) MISC, APPLY SENSOR ON UPPER ARM EVERY 14 DAYS FOR CONTINUOUS GLUCOSE MONITORING, Disp: 2 each, Rfl: 2   Continuous Blood Gluc Sensor (FREESTYLE LIBRE 3 SENSOR) MISC, Use for continuous blood glucose monitoring, Disp: 6 each, Rfl: 3   Dulaglutide (TRULICITY) 3.00 TM/2.2QJ SOPN, Inject 0.75 mg into the skin once a week., Disp: 2 mL, Rfl: 2    empagliflozin (JARDIANCE) 10 MG TABS tablet, TAKE 1 TABLET(10 MG) BY MOUTH DAILY WITH BREAKFAST, Disp: 90 tablet, Rfl: 3   glucose blood (ONETOUCH VERIO) test strip, USE TO TEST BLOOD SUGAR ONCE DAILY, Disp: 100 strip, Rfl: 3   lisinopril-hydrochlorothiazide (ZESTORETIC) 10-12.5 MG tablet, Take 1 tablet by mouth daily., Disp: 90 tablet, Rfl: 3   loratadine (CLARITIN) 10 MG tablet, Claritin 10 mg tablet  Take 1 tablet every day by oral route., Disp: , Rfl:    Multiple Vitamins-Minerals (MULTIVITAMIN WITH MINERALS) tablet, Take 1 tablet by mouth daily. With Diabetic Support, Disp: , Rfl:    OneTouch Delica Lancets 33L MISC, Use once daily to check glucose., Disp: 100 each, Rfl: 1   Probiotic Product (PROBIOTIC-10 PO), Take 1 capsule by mouth daily. Takes [employer] Digestive Probiotic, Disp: , Rfl:    rosuvastatin (CRESTOR) 5 MG tablet, Take 1 tablet (5 mg total) by mouth daily. Start with every other day for 1 week and then once daily, Disp: 90 tablet, Rfl: 3   Medications ordered in this encounter:  Meds ordered this encounter  Medications   doxycycline (VIBRA-TABS) 100 MG tablet    Sig: Take 1 tablet (100 mg total) by mouth 2 (two) times daily for 10 days.    Dispense:  20 tablet    Refill:  0    Order Specific Question:   Supervising Provider    Answer:   Chase Picket [id number]   albuterol (VENTOLIN HFA) 108 (90 Base)  MCG/ACT inhaler    Sig: Inhale 1-2 puffs into the lungs every 6 (six) hours as needed for wheezing or shortness of breath.    Dispense:  8 g    Refill:  0    Order Specific Question:   Supervising Provider    Answer:   Chase Picket [[id number]]   predniSONE (DELTASONE) 20 MG tablet    Sig: Take 2 tablets (40 mg total) by mouth daily with breakfast for 3 days.    Dispense:  6 tablet    Refill:  0    Order Specific Question:   Supervising Provider    Answer:   Chase Picket [id number]     *If you need refills on other medications prior to your next  appointment, please contact your pharmacy*  Follow-Up: Call back or seek an in-person evaluation if the symptoms worsen or if the condition fails to improve as anticipated.  [place] [phone]  Other Instructions  -Please be seen if you are not improving, developing chest pain, increased shortness of breath even with inhaler use, fever, mucus color changes- or worsening symptoms.  Happy Thanksgiving    -Take meds as prescribed -Rest -Use a cool mist humidifier especially during the winter months when heat dries out the air. - Use saline nose sprays frequently to help soothe nasal passages and promote drainage. -Saline irrigations of the nose can be very helpful if done frequently.             * 4X daily for 1 week*             * Use of a nettie pot can be helpful with this.  *Follow directions with this* *Boiled or distilled water only -stay hydrated by drinking plenty of fluids - Keep thermostat turn down low to prevent drying out sinuses - For any cough or congestion- robitussin DM or Delsym as needed - For fever or aches or pains- take tylenol or ibuprofen as directed on bottle             * for fevers greater than 101 orally you may alternate ibuprofen and tylenol every 3 hours.  If you do not improve you will need a follow up visit in person.                 If you have been instructed to have an in-person evaluation today at a local Urgent Care facility, please use the link below. It will take you to a list of all of our available [facility], including address, phone number and hours of operation. Please do not delay care.  [facility]  If you or a family member do not have a primary care provider, use the link below to schedule a visit and establish care. When you choose a [facility] primary care physician or advanced practice provider, you gain a long-term partner in health. Find a Primary Care Provider  Learn more about  [facility] in-office and virtual care options: [facility]

## 2022-09-01 NOTE — Progress Notes (Signed)
Virtual Visit Consent   Angel Dawson, you are scheduled for a virtual visit with a [facility] provider today. Just as with appointments in the office, your consent must be obtained to participate. Your consent will be active for this visit and any virtual visit you may have with one of our providers in the next 365 days. If you have a MyChart account, a copy of this consent can be sent to you electronically.  As this is a virtual visit, video technology does not allow for your provider to perform a traditional examination. This may limit your provider's ability to fully assess your condition. If your provider identifies any concerns that need to be evaluated in person or the need to arrange testing (such as labs, EKG, etc.), we will make arrangements to do so. Although advances in technology are sophisticated, we cannot ensure that it will always work on either your end or our end. If the connection with a video visit is poor, the visit may have to be switched to a telephone visit. With either a video or telephone visit, we are not always able to ensure that we have a secure connection.  By engaging in this virtual visit, you consent to the provision of healthcare and authorize for your insurance to be billed (if applicable) for the services provided during this visit. Depending on your insurance coverage, you may receive a charge related to this service.  I need to obtain your verbal consent now. Are you willing to proceed with your visit today? Angel Dawson has provided verbal consent on 09/01/2022 for a virtual visit (video or telephone). Perlie Mayo, NP  Date: 09/01/2022 10:01 AM  Virtual Visit via Video Note   I, Perlie Mayo, connected with  Angel Dawson  ([id number], 1981/01/30) on 09/01/22 at 10:00 AM EST by a video-enabled telemedicine application and verified that I am speaking with the correct person using two identifiers.  Location: Patient: Virtual Visit Location  Patient: Home Provider: Virtual Visit Location Provider: Home Office   I discussed the limitations of evaluation and management by telemedicine and the availability of in person appointments. The patient expressed understanding and agreed to proceed.    History of Present Illness: Angel Dawson is a 41 y.o. who identifies as a female who was assigned female at birth, and is being seen today for chest tightness and cough. Noticed some tightness in chest was noticed a week ago- it was waxing and wane- yesterday it was really continuous and took gas x with improvement. Reports that she has some URI symptoms around 1-2 weeks ago and was using OTC to treat that. Today woke up with increased tightness, cough, hoarseness, winded-mildly with talking. Has been using nasal neti pot so drainage is controlled with this.  Does use CPAP and it feels overwhelming to her, which is not normal. Denies fevers, chills, chest pain, ear pain or throat pain.   Problems:  Patient Active Problem List   Diagnosis Date Noted   Type 2 diabetes mellitus with other specified complication ([place]) 67/89/3810   Essential hypertension 08/19/2022   OSA on CPAP 08/19/2022   Deviated septum 01/11/2021   Obstructive sleep apnea treated with continuous positive airway pressure (CPAP) 12/29/2017   Onychomycosis 10/26/2014   Gastroesophageal reflux disease without esophagitis 09/28/2014   Diabetes mellitus type II, uncontrolled 09/28/2014   Hypertension 08/25/2014   Morbid obesity ([place]) 08/25/2014   Chronic rhinitis 06/30/2014   Pancreatitis, acute 07/02/2013   Gallstones 04/13/2013  Allergies: No Known Allergies Medications:  Current Outpatient Medications:    amLODipine (NORVASC) 5 MG tablet, Take 1 tablet (5 mg total) by mouth daily., Disp: 30 tablet, Rfl: 0   Blood Glucose Monitoring Suppl (BLOOD GLUCOSE METER KIT AND SUPPLIES), Dispense based on patient and insurance preference. Use up to four times daily as directed.  (FOR ICD-9 250.00, 250.01)., Disp: 1 each, Rfl: 0   Cholecalciferol 1.25 MG (50000 UT) capsule, , Disp: , Rfl:    Continuous Blood Gluc Sensor (FREESTYLE LIBRE 3 SENSOR) MISC, APPLY SENSOR ON UPPER ARM EVERY 14 DAYS FOR CONTINUOUS GLUCOSE MONITORING, Disp: 2 each, Rfl: 2   Continuous Blood Gluc Sensor (FREESTYLE LIBRE 3 SENSOR) MISC, Use for continuous blood glucose monitoring, Disp: 6 each, Rfl: 3   Dulaglutide (TRULICITY) 2.64 BR/8.3EN SOPN, Inject 0.75 mg into the skin once a week., Disp: 2 mL, Rfl: 2   empagliflozin (JARDIANCE) 10 MG TABS tablet, TAKE 1 TABLET(10 MG) BY MOUTH DAILY WITH BREAKFAST, Disp: 90 tablet, Rfl: 3   glucose blood (ONETOUCH VERIO) test strip, USE TO TEST BLOOD SUGAR ONCE DAILY, Disp: 100 strip, Rfl: 3   lisinopril-hydrochlorothiazide (ZESTORETIC) 10-12.5 MG tablet, Take 1 tablet by mouth daily., Disp: 90 tablet, Rfl: 3   loratadine (CLARITIN) 10 MG tablet, Claritin 10 mg tablet  Take 1 tablet every day by oral route., Disp: , Rfl:    Multiple Vitamins-Minerals (MULTIVITAMIN WITH MINERALS) tablet, Take 1 tablet by mouth daily. With Diabetic Support, Disp: , Rfl:    OneTouch Delica Lancets 40H MISC, Use once daily to check glucose., Disp: 100 each, Rfl: 1   Probiotic Product (PROBIOTIC-10 PO), Take 1 capsule by mouth daily. Takes [employer] Digestive Probiotic, Disp: , Rfl:    rosuvastatin (CRESTOR) 5 MG tablet, Take 1 tablet (5 mg total) by mouth daily. Start with every other day for 1 week and then once daily, Disp: 90 tablet, Rfl: 3  Observations/Objective: Patient is well-developed, well-nourished in no acute distress.  Resting comfortably  at home.  Head is normocephalic, atraumatic.  No labored breathing.  Speech is clear and coherent with logical content.  Patient is alert and oriented at baseline.  Cough-dry Hoarseness   Assessment and Plan: 1. Acute bacterial sinusitis  - doxycycline (VIBRA-TABS) 100 MG tablet; Take 1 tablet (100 mg total) by mouth 2  (two) times daily for 10 days.  Dispense: 20 tablet; Refill: 0 - predniSONE (DELTASONE) 20 MG tablet; Take 2 tablets (40 mg total) by mouth daily with breakfast for 3 days.  Dispense: 6 tablet; Refill: 0  2. Acute cough  - doxycycline (VIBRA-TABS) 100 MG tablet; Take 1 tablet (100 mg total) by mouth 2 (two) times daily for 10 days.  Dispense: 20 tablet; Refill: 0 - albuterol (VENTOLIN HFA) 108 (90 Base) MCG/ACT inhaler; Inhale 1-2 puffs into the lungs every 6 (six) hours as needed for wheezing or shortness of breath.  Dispense: 8 g; Refill: 0 - predniSONE (DELTASONE) 20 MG tablet; Take 2 tablets (40 mg total) by mouth daily with breakfast for 3 days.  Dispense: 6 tablet; Refill: 0  -S&S consistent with sinus infection and with use of CPAP worsening bronchospasm response -No red flags for PNA at this time, will give Doxy and pred and inhaler to get cough under control and help with windedness.   -Take meds as prescribed -Rest -Use a cool mist humidifier especially during the winter months when heat dries out the air. - Use saline nose sprays frequently to help soothe nasal  passages and promote drainage. -Saline irrigations of the nose can be very helpful if done frequently.             * 4X daily for 1 week*             * Use of a nettie pot can be helpful with this.  *Follow directions with this* *Boiled or distilled water only -stay hydrated by drinking plenty of fluids - Keep thermostat turn down low to prevent drying out sinuses - For any cough or congestion- robitussin DM or Delsym as needed - For fever or aches or pains- take tylenol or ibuprofen as directed on bottle             * for fevers greater than 101 orally you may alternate ibuprofen and tylenol every 3 hours.  If you do not improve you will need a follow up visit in person.             -Please be seen if you are not improving, developing chest pain, increased shortness of breath even with inhaler use, fever, mucus color  changes- or worsening symptoms.   Reviewed side effects, risks and benefits of medication.    Patient acknowledged agreement and understanding of the plan.   Past Medical, Surgical, Social History, Allergies, and Medications have been Reviewed.     Follow Up Instructions: I discussed the assessment and treatment plan with the patient. The patient was provided an opportunity to ask questions and all were answered. The patient agreed with the plan and demonstrated an understanding of the instructions.  A copy of instructions were sent to the patient via MyChart unless otherwise noted below.     The patient was advised to call back or seek an in-person evaluation if the symptoms worsen or if the condition fails to improve as anticipated.  Time:  I spent 11 minutes with the patient via telehealth technology discussing the above problems/concerns.    Perlie Mayo, NP

## 2022-09-02 NOTE — Telephone Encounter (Signed)
CPAP DL looks good. No changes

## 2022-09-09 ENCOUNTER — Encounter: Payer: Self-pay | Admitting: Family Medicine

## 2022-09-09 DIAGNOSIS — G4733 Obstructive sleep apnea (adult) (pediatric): Secondary | ICD-10-CM | POA: Diagnosis not present

## 2022-09-09 DIAGNOSIS — I10 Essential (primary) hypertension: Secondary | ICD-10-CM

## 2022-09-10 MED ORDER — TRULICITY 1.5 MG/0.5ML ~~LOC~~ SOAJ: 1.5000 mg | SUBCUTANEOUS | 1 refills | Status: DC

## 2022-09-10 MED ORDER — LISINOPRIL-HYDROCHLOROTHIAZIDE 20-25 MG PO TABS: 1.0000 | ORAL_TABLET | Freq: Every day | ORAL | 3 refills | Status: DC

## 2022-09-10 NOTE — Addendum Note (Signed)
Addended by: Lamar Blinks C on: 09/10/2022 03:10 PM   Modules accepted: Orders

## 2022-09-11 DIAGNOSIS — F4320 Adjustment disorder, unspecified: Secondary | ICD-10-CM | POA: Diagnosis not present

## 2022-09-13 ENCOUNTER — Emergency Department (HOSPITAL_BASED_OUTPATIENT_CLINIC_OR_DEPARTMENT_OTHER): Payer: BC Managed Care – PPO

## 2022-09-13 ENCOUNTER — Encounter (HOSPITAL_BASED_OUTPATIENT_CLINIC_OR_DEPARTMENT_OTHER): Payer: Self-pay | Admitting: Emergency Medicine

## 2022-09-13 ENCOUNTER — Emergency Department (HOSPITAL_BASED_OUTPATIENT_CLINIC_OR_DEPARTMENT_OTHER)
Admission: EM | Admit: 2022-09-13 | Discharge: 2022-09-13 | Disposition: A | Discharge status: Home / Self Care | Payer: BC Managed Care – PPO | Attending: Emergency Medicine | Admitting: Emergency Medicine

## 2022-09-13 ENCOUNTER — Other Ambulatory Visit: Payer: Self-pay

## 2022-09-13 DIAGNOSIS — Z7984 Long term (current) use of oral hypoglycemic drugs: Secondary | ICD-10-CM | POA: Diagnosis not present

## 2022-09-13 DIAGNOSIS — R052 Subacute cough: Secondary | ICD-10-CM | POA: Diagnosis not present

## 2022-09-13 DIAGNOSIS — R911 Solitary pulmonary nodule: Secondary | ICD-10-CM | POA: Diagnosis not present

## 2022-09-13 DIAGNOSIS — Z1152 Encounter for screening for COVID-19: Secondary | ICD-10-CM | POA: Diagnosis not present

## 2022-09-13 DIAGNOSIS — R000 Tachycardia, unspecified: Secondary | ICD-10-CM | POA: Diagnosis not present

## 2022-09-13 DIAGNOSIS — R059 Cough, unspecified: Secondary | ICD-10-CM | POA: Diagnosis not present

## 2022-09-13 DIAGNOSIS — R0789 Other chest pain: Secondary | ICD-10-CM | POA: Insufficient documentation

## 2022-09-13 DIAGNOSIS — D72829 Elevated white blood cell count, unspecified: Secondary | ICD-10-CM | POA: Insufficient documentation

## 2022-09-13 DIAGNOSIS — R0602 Shortness of breath: Secondary | ICD-10-CM | POA: Diagnosis not present

## 2022-09-13 LAB — CBC WITH DIFFERENTIAL/PLATELET
Abs Immature Granulocytes: 0.11 10*3/uL — ABNORMAL HIGH (ref 0.00–0.07)
Basophils Absolute: 0.1 10*3/uL (ref 0.0–0.1)
Basophils Relative: 0 %
Eosinophils Absolute: 0.1 10*3/uL (ref 0.0–0.5)
Eosinophils Relative: 0 %
HCT: 42.3 % (ref 36.0–46.0)
Hemoglobin: 13.8 g/dL (ref 12.0–15.0)
Immature Granulocytes: 0 %
Lymphocytes Relative: 10 %
Lymphs Abs: 2.5 10*3/uL (ref 0.7–4.0)
MCH: 29.7 pg (ref 26.0–34.0)
MCHC: 32.6 g/dL (ref 30.0–36.0)
MCV: 91.2 fL (ref 80.0–100.0)
Monocytes Absolute: 0.6 10*3/uL (ref 0.1–1.0)
Monocytes Relative: 2 %
Neutro Abs: 21.1 10*3/uL — ABNORMAL HIGH (ref 1.7–7.7)
Neutrophils Relative %: 88 %
Platelets: 263 10*3/uL (ref 150–400)
RBC: 4.64 MIL/uL (ref 3.87–5.11)
RDW: 13.2 % (ref 11.5–15.5)
WBC: 24.5 10*3/uL — ABNORMAL HIGH (ref 4.0–10.5)
nRBC: 0 % (ref 0.0–0.2)

## 2022-09-13 LAB — TROPONIN I (HIGH SENSITIVITY)
Troponin I (High Sensitivity): 3 ng/L (ref <18)
Troponin I (High Sensitivity): 3 ng/L (ref <18)

## 2022-09-13 LAB — COMPREHENSIVE METABOLIC PANEL
ALT: 21 U/L (ref 0–44)
AST: 23 U/L (ref 15–41)
Albumin: 4 g/dL (ref 3.5–5.0)
Alkaline Phosphatase: 83 U/L (ref 38–126)
Anion gap: 6 (ref 5–15)
BUN: 12 mg/dL (ref 6–20)
CO2: 25 mmol/L (ref 22–32)
Calcium: 9.8 mg/dL (ref 8.9–10.3)
Chloride: 107 mmol/L (ref 98–111)
Creatinine, Ser: 0.87 mg/dL (ref 0.44–1.00)
GFR, Estimated: >60 mL/min (ref >60)
Glucose, Bld: 146 mg/dL — ABNORMAL HIGH (ref 70–99)
Potassium: 3.7 mmol/L (ref 3.5–5.1)
Sodium: 138 mmol/L (ref 135–145)
Total Bilirubin: 0.5 mg/dL (ref 0.3–1.2)
Total Protein: 7.1 g/dL (ref 6.5–8.1)

## 2022-09-13 LAB — RESP PANEL BY RT-PCR (RSV, FLU A&B, COVID)  RVPGX2
Influenza A by PCR: NEGATIVE
Influenza B by PCR: NEGATIVE
Resp Syncytial Virus by PCR: NEGATIVE
SARS Coronavirus 2 by RT PCR: NEGATIVE

## 2022-09-13 LAB — LIPASE, BLOOD: Lipase: 44 U/L (ref 11–51)

## 2022-09-13 LAB — D-DIMER, QUANTITATIVE: D-Dimer, Quant: <0.27 ug/mL-FEU (ref 0.00–0.50)

## 2022-09-13 MED ORDER — IPRATROPIUM-ALBUTEROL 0.5-2.5 (3) MG/3ML IN SOLN
3.0000 mL | Freq: Once | RESPIRATORY_TRACT | Status: AC
  Administered 2022-09-13: 3 mL via RESPIRATORY_TRACT
  Filled 2022-09-13: qty 3

## 2022-09-13 MED ORDER — IOHEXOL 300 MG/ML  SOLN
100.0000 mL | Freq: Once | INTRAMUSCULAR | Status: AC | PRN
  Administered 2022-09-13: 100 mL via INTRAVENOUS

## 2022-09-13 NOTE — ED Provider Notes (Signed)
[facility]ERGENC[facility] DEPARTMENT Provider Note   CSN: [id number] Arrival date & time: 09/13/22  5284     History  Chief Complaint  Patient presents with   Shortness of Breath   Abdominal Pain    Angel Dawson is a 41 y.o. female.  Pt is a 41 yo female presenting for chest tightness and cough. Pt admits to chest tightness, sob, and cough x 3 weeks ago. States she had a virtual visit with a provider 10 days ago who provided her with prescription for prednisone 20 mg x 3 days, albuterol inhaler PRN, and doxycyline 100 mg bid x 10 days. Pt states she originally had improvement of chest tightness and cough 5 days but it returned last night. States she now has rib and abdominal pain from coughing. Denies prior hx of pneumonia. Denies hx of smoking, copd, or asthma. Denies hx of DVT/PE.  [place]  The history is provided by the patient. No language interpreter was used.  Shortness of Breath Associated symptoms: abdominal pain   Abdominal Pain Associated symptoms: shortness of breath        Home Medications Prior to Admission medications   Medication Sig Start Date End Date Taking? Authorizing Provider  albuterol (VENTOLIN HFA) 108 (90 Base) MCG/ACT inhaler Inhale 1-2 puffs into the lungs every 6 (six) hours as needed for wheezing or shortness of breath. 09/01/22   Perlie Mayo, NP  amLODipine (NORVASC) 5 MG tablet Take 1 tablet (5 mg total) by mouth daily. 04/18/22   [name], Gay Filler, MD  Blood Glucose Monitoring Suppl (BLOOD GLUCOSE METER KIT AND SUPPLIES) Dispense based on patient and insurance preference. Use up to four times daily as directed. (FOR ICD-9 250.00, 250.01). 08/26/14   Annita Brod, MD  Cholecalciferol 1.25 MG (50000 UT) capsule     [provider]  Continuous Blood Gluc Sensor (FREESTYLE LIBRE 3 SENSOR) MISC APPLY SENSOR ON UPPER ARM EVERY 14 DAYS FOR CONTINUOUS GLUCOSE MONITORING 06/06/22   Elayne Snare, MD  Continuous Blood Gluc Sensor  (FREESTYLE LIBRE 3 SENSOR) MISC Use for continuous blood glucose monitoring 08/20/22   [name], Gay Filler, MD  Dulaglutide (TRULICITY) 1.5 XL/2.4MW SOPN Inject 1.5 mg into the skin once a week. 09/10/22   [name], Gay Filler, MD  empagliflozin (JARDIANCE) 10 MG TABS tablet TAKE 1 TABLET(10 MG) BY MOUTH DAILY WITH BREAKFAST 08/20/22   [name], Gay Filler, MD  glucose blood (ONETOUCH VERIO) test strip USE TO TEST BLOOD SUGAR ONCE DAILY 05/28/21   Elayne Snare, MD  lisinopril-hydrochlorothiazide (ZESTORETIC) 20-25 MG tablet Take 1 tablet by mouth daily. 09/10/22   [name], Gay Filler, MD  loratadine (CLARITIN) 10 MG tablet Claritin 10 mg tablet  Take 1 tablet every day by oral route.    [provider]  Multiple Vitamins-Minerals (MULTIVITAMIN WITH MINERALS) tablet Take 1 tablet by mouth daily. With Diabetic Support    [provider]  OneTouch Delica Lancets 10U MISC Use once daily to check glucose. 12/29/18   Elayne Snare, MD  Probiotic Product (PROBIOTIC-10 PO) Take 1 capsule by mouth daily. Takes Fortify Women's Digestive Probiotic    [provider]  rosuvastatin (CRESTOR) 5 MG tablet Take 1 tablet (5 mg total) by mouth daily. Start with every other day for 1 week and then once daily 08/20/22   [name], Gay Filler, MD      Allergies    Patient has no known allergies.    Review of Systems   Review of Systems  Respiratory:  Positive for shortness of breath.   Gastrointestinal:  Positive for abdominal pain.    Physical Exam Updated Vital Signs BP (!) 152/81   Pulse 90   Temp 98.8 F (37.1 C) (Oral)   Resp (!) 32   Ht _0  (1.803 m)   Wt (!) 181.4 kg   SpO2 99%   BMI 55.79 kg/m  Physical Exam  ED Results / Procedures / Treatments   Labs (all labs ordered are listed, but only abnormal results are displayed) Labs Reviewed  COMPREHENSIVE METABOLIC PANEL - Abnormal; Notable for the following components:      Result Value   Glucose, Bld 146 (*)    All other  components within normal limits  CBC WITH DIFFERENTIAL/PLATELET - Abnormal; Notable for the following components:   WBC 24.5 (*)    Neutro Abs 21.1 (*)    Abs Immature Granulocytes 0.11 (*)    All other components within normal limits  RESP PANEL BY RT-PCR (RSV, FLU A&B, COVID)  RVPGX2  LIPASE, BLOOD  D-DIMER, QUANTITATIVE  TROPONIN I (HIGH SENSITIVITY)  TROPONIN I (HIGH SENSITIVITY)    EKG EKG Interpretation  Date/Time:  Saturday September 13 2022 07:00:10 EST Ventricular Rate:  106 PR Interval:  186 QRS Duration: 95 QT Interval:  329 QTC Calculation: 437 R Axis:   14 Text Interpretation: Sinus tachycardia Low voltage, precordial leads Baseline wander in lead(s) V5 Confirmed by Campbell Stall ([id number]) on 93/11/3555 10:03:35 AM  Radiology CT Chest W Contrast  Result Date: 09/13/2022 CLINICAL DATA:  Shortness of breath for 3 weeks. EXAM: CT CHEST WITH CONTRAST TECHNIQUE: Multidetector CT imaging of the chest was performed during intravenous contrast administration. RADIATION DOSE REDUCTION: This exam was performed according to the departmental dose-optimization program which includes automated exposure control, adjustment of the mA and/or kV according to patient size and/or use of iterative reconstruction technique. CONTRAST:  165m OMNIPAQUE IOHEXOL 300 MG/ML  SOLN COMPARISON:  June 29, 2013 FINDINGS: Cardiovascular: No significant vascular findings. Normal heart size. No pericardial effusion. Mediastinum/Nodes: No enlarged mediastinal, hilar, or axillary lymph nodes. Thyroid gland, trachea, and esophagus demonstrate no significant findings. Lungs/Pleura: There is a 2 mm nodule in the left apex. Lungs are otherwise clear. No pleural effusion or pneumothorax. Upper Abdomen: Diffuse low density of the liver is identified. Prior cholecystectomy. There is a 4.3 x 3.4 cm simple cyst in the upper pole left kidney. No follow-up is recommended. Musculoskeletal: No chest wall abnormality. No acute  or significant osseous findings. IMPRESSION: 1. No acute cardiopulmonary process. 2. 2 mm nodule in the left apex. No follow-up needed if patient is low-risk.This recommendation follows the consensus statement: Guidelines for Management of Incidental Pulmonary Nodules Detected on CT Images: From the Fleischner Society 2017; Radiology 2017; 284:228-243. 3. Hepatic steatosis. Electronically Signed   By: WAbelardo DieselM.D.   On: 09/13/2022 10:19   DG Chest Portable 1 View  Result Date: 09/13/2022 CLINICAL DATA:  41year old female with history of shortness of breath for the past 3 weeks. EXAM: PORTABLE CHEST 1 VIEW COMPARISON:  Chest x-ray 06/29/2013. FINDINGS: Lung volumes are low. No consolidative airspace disease. No pleural effusions. No pneumothorax. No pulmonary nodule or mass noted. Pulmonary vasculature and the cardiomediastinal silhouette are within normal limits. IMPRESSION: 1. Low lung volumes without radiographic evidence of acute cardiopulmonary disease. Electronically Signed   By: DVinnie LangtonM.D.   On: 09/13/2022 07:45    Procedures Procedures    Medications Ordered in ED Medications  ipratropium-albuterol (  DUONEB) 0.5-2.5 (3) MG/3ML nebulizer solution 3 mL (3 mLs Nebulization Given 09/13/22 0756)  iohexol (OMNIPAQUE) 300 MG/ML solution 100 mL (100 mLs Intravenous Contrast Given 09/13/22 1587)    ED Course/ Medical Decision Making/ A&P                           Medical Decision Making Amount and/or Complexity of Data Reviewed Labs: ordered. Radiology: ordered.  Risk Prescription drug management.  41 yo female presenting for chest tightness and cough.  Patient is alert and oriented x3, no acute distress, afebrile, stable vital signs.  Physical exam demonstrates equal bilateral breath sounds with no adventitious lung sounds.  Chest x-ray nondiagnostic.  Concerned due to laboratory studies demonstrating leukocytosis of 24.5 with neutrophilia.  CT ordered.  CT study  demonstrates left upper lobe nodule with no follow-up studies if patient is low risk.  Patient recommended for close follow-up with pulmonology given continued cough and leukocytosis.  Patient social history positive for working with animals.  Otherwise patient is nontoxic appearing, well, without signs of respiratory distress.  Patient in no distress and overall condition improved here in the ED. Detailed discussions were had with the patient regarding current findings, and need for close f/u with PCP or on call doctor. The patient has been instructed to return immediately if the symptoms worsen in any way for re-evaluation. Patient verbalized understanding and is in agreement with current care plan. All questions answered prior to discharge.        Final Clinical Impression(s) / ED Diagnoses Final diagnoses:  Subacute cough    Rx / DC Orders ED Discharge Orders     None         Lianne Cure, DO 27/61/84 1046

## 2022-09-13 NOTE — Discharge Instructions (Signed)
Please call make an appointment with pulmonology for continued cough and elevated white blood cell count.

## 2022-09-13 NOTE — ED Notes (Signed)
ED Provider at bedside. 

## 2022-09-13 NOTE — ED Notes (Signed)
Unsuccessful IV attempt, LAC, pt tolerated well

## 2022-09-13 NOTE — ED Triage Notes (Signed)
Pt is c/o shortness of breath x 3 weeks  Pt states she had a virtual visit 10 days ago and they gave her an antibiotic, three days of steroids, and an inhaler  Pt states last night she started feeling more short of breath   Pt states she has had chest tightness for the past 3 weeks but started having chest pain last night   Pt states her chest pain is mostly in the center but goes to the left and feels like a dull squeezing, worse when she takes a deep breath

## 2022-09-16 ENCOUNTER — Ambulatory Visit: Payer: BC Managed Care – PPO | Admitting: Neurology

## 2022-09-25 DIAGNOSIS — F4320 Adjustment disorder, unspecified: Secondary | ICD-10-CM | POA: Diagnosis not present

## 2022-10-08 DIAGNOSIS — G4733 Obstructive sleep apnea (adult) (pediatric): Secondary | ICD-10-CM | POA: Diagnosis not present

## 2022-10-14 ENCOUNTER — Ambulatory Visit (HOSPITAL_BASED_OUTPATIENT_CLINIC_OR_DEPARTMENT_OTHER)
Admission: RE | Admit: 2022-10-14 | Discharge: 2022-10-14 | Disposition: A | Discharge status: Home / Self Care | Payer: BC Managed Care – PPO | Source: Ambulatory Visit | Attending: Family Medicine | Admitting: Family Medicine

## 2022-10-14 DIAGNOSIS — E1169 Type 2 diabetes mellitus with other specified complication: Secondary | ICD-10-CM | POA: Insufficient documentation

## 2022-10-14 DIAGNOSIS — I10 Essential (primary) hypertension: Secondary | ICD-10-CM | POA: Insufficient documentation

## 2022-10-14 DIAGNOSIS — Z0289 Encounter for other administrative examinations: Secondary | ICD-10-CM

## 2022-10-15 ENCOUNTER — Encounter: Payer: Self-pay | Admitting: Family Medicine

## 2022-10-15 DIAGNOSIS — G4733 Obstructive sleep apnea (adult) (pediatric): Secondary | ICD-10-CM | POA: Diagnosis not present

## 2022-10-16 DIAGNOSIS — F4320 Adjustment disorder, unspecified: Secondary | ICD-10-CM | POA: Diagnosis not present

## 2022-10-17 ENCOUNTER — Encounter: Payer: Self-pay | Admitting: Family Medicine

## 2022-10-30 DIAGNOSIS — F4320 Adjustment disorder, unspecified: Secondary | ICD-10-CM | POA: Diagnosis not present

## 2022-11-08 DIAGNOSIS — G4733 Obstructive sleep apnea (adult) (pediatric): Secondary | ICD-10-CM | POA: Diagnosis not present

## 2022-11-11 DIAGNOSIS — F4320 Adjustment disorder, unspecified: Secondary | ICD-10-CM | POA: Diagnosis not present

## 2022-11-13 ENCOUNTER — Encounter (INDEPENDENT_AMBULATORY_CARE_PROVIDER_SITE_OTHER): Payer: Self-pay | Admitting: Family Medicine

## 2022-11-13 ENCOUNTER — Ambulatory Visit (INDEPENDENT_AMBULATORY_CARE_PROVIDER_SITE_OTHER): Payer: BC Managed Care – PPO | Admitting: Family Medicine

## 2022-11-13 VITALS — BP 153/90 | HR 90 | Temp 98.1°F | Ht 71.0 in | Wt >= 6400 oz

## 2022-11-13 DIAGNOSIS — E1169 Type 2 diabetes mellitus with other specified complication: Secondary | ICD-10-CM | POA: Diagnosis not present

## 2022-11-13 DIAGNOSIS — F32A Depression, unspecified: Secondary | ICD-10-CM | POA: Insufficient documentation

## 2022-11-13 DIAGNOSIS — Z6841 Body Mass Index (BMI) 40.0 and over, adult: Secondary | ICD-10-CM

## 2022-11-13 DIAGNOSIS — R0602 Shortness of breath: Secondary | ICD-10-CM

## 2022-11-13 DIAGNOSIS — G8929 Other chronic pain: Secondary | ICD-10-CM | POA: Diagnosis not present

## 2022-11-13 DIAGNOSIS — Z7984 Long term (current) use of oral hypoglycemic drugs: Secondary | ICD-10-CM

## 2022-11-13 DIAGNOSIS — I10 Essential (primary) hypertension: Secondary | ICD-10-CM

## 2022-11-13 DIAGNOSIS — Z1211 Encounter for screening for malignant neoplasm of colon: Secondary | ICD-10-CM | POA: Insufficient documentation

## 2022-11-13 DIAGNOSIS — Z7985 Long-term (current) use of injectable non-insulin antidiabetic drugs: Secondary | ICD-10-CM

## 2022-11-13 DIAGNOSIS — Z1331 Encounter for screening for depression: Secondary | ICD-10-CM | POA: Insufficient documentation

## 2022-11-13 DIAGNOSIS — F3289 Other specified depressive episodes: Secondary | ICD-10-CM

## 2022-11-13 DIAGNOSIS — E7849 Other hyperlipidemia: Secondary | ICD-10-CM

## 2022-11-13 DIAGNOSIS — R5383 Other fatigue: Secondary | ICD-10-CM | POA: Insufficient documentation

## 2022-11-14 LAB — HEMOGLOBIN A1C
Est. average glucose Bld gHb Est-mCnc: 134 mg/dL
Hgb A1c MFr Bld: 6.3 % — ABNORMAL HIGH (ref 4.8–5.6)

## 2022-11-14 LAB — COMPREHENSIVE METABOLIC PANEL
ALT: 26 IU/L (ref 0–32)
AST: 28 IU/L (ref 0–40)
Albumin/Globulin Ratio: 1.7 (ref 1.2–2.2)
Albumin: 4.3 g/dL (ref 3.9–4.9)
Alkaline Phosphatase: 98 IU/L (ref 44–121)
BUN/Creatinine Ratio: 10 (ref 9–23)
BUN: 8 mg/dL (ref 6–24)
Bilirubin Total: 0.3 mg/dL (ref 0.0–1.2)
CO2: 23 mmol/L (ref 20–29)
Calcium: 9.6 mg/dL (ref 8.7–10.2)
Chloride: 101 mmol/L (ref 96–106)
Creatinine, Ser: 0.77 mg/dL (ref 0.57–1.00)
Globulin, Total: 2.5 g/dL (ref 1.5–4.5)
Glucose: 108 mg/dL — ABNORMAL HIGH (ref 70–99)
Potassium: 4 mmol/L (ref 3.5–5.2)
Sodium: 137 mmol/L (ref 134–144)
Total Protein: 6.8 g/dL (ref 6.0–8.5)
eGFR: 98 mL/min/{1.73_m2} (ref >59)

## 2022-11-14 LAB — CBC WITH DIFFERENTIAL/PLATELET
Basophils Absolute: 0 10*3/uL (ref 0.0–0.2)
Basos: 0 %
EOS (ABSOLUTE): 0.1 10*3/uL (ref 0.0–0.4)
Eos: 2 %
Hematocrit: 41.5 % (ref 34.0–46.6)
Hemoglobin: 13.8 g/dL (ref 11.1–15.9)
Immature Grans (Abs): 0 10*3/uL (ref 0.0–0.1)
Immature Granulocytes: 0 %
Lymphocytes Absolute: 2.3 10*3/uL (ref 0.7–3.1)
Lymphs: 26 %
MCH: 29.7 pg (ref 26.6–33.0)
MCHC: 33.3 g/dL (ref 31.5–35.7)
MCV: 89 fL (ref 79–97)
Monocytes Absolute: 0.4 10*3/uL (ref 0.1–0.9)
Monocytes: 4 %
Neutrophils Absolute: 6.1 10*3/uL (ref 1.4–7.0)
Neutrophils: 68 %
Platelets: 295 10*3/uL (ref 150–450)
RBC: 4.65 x10E6/uL (ref 3.77–5.28)
RDW: 12.8 % (ref 11.7–15.4)
WBC: 8.9 10*3/uL (ref 3.4–10.8)

## 2022-11-14 LAB — LIPID PANEL
Chol/HDL Ratio: 3 ratio (ref 0.0–4.4)
Cholesterol, Total: 144 mg/dL (ref 100–199)
HDL: 48 mg/dL (ref >39)
LDL Chol Calc (NIH): 72 mg/dL (ref 0–99)
Triglycerides: 137 mg/dL (ref 0–149)
VLDL Cholesterol Cal: 24 mg/dL (ref 5–40)

## 2022-11-14 LAB — T4, FREE: Free T4: 1.31 ng/dL (ref 0.82–1.77)

## 2022-11-14 LAB — FOLATE: Folate: 18.1 ng/mL (ref >3.0)

## 2022-11-14 LAB — VITAMIN D 25 HYDROXY (VIT D DEFICIENCY, FRACTURES): Vit D, 25-Hydroxy: 45.1 ng/mL (ref 30.0–100.0)

## 2022-11-14 LAB — INSULIN, RANDOM: INSULIN: 28.4 u[IU]/mL — ABNORMAL HIGH (ref 2.6–24.9)

## 2022-11-14 LAB — TSH: TSH: 1.31 u[IU]/mL (ref 0.450–4.500)

## 2022-11-14 LAB — VITAMIN B12: Vitamin B-12: 614 pg/mL (ref 232–1245)

## 2022-11-25 DIAGNOSIS — F4320 Adjustment disorder, unspecified: Secondary | ICD-10-CM | POA: Diagnosis not present

## 2022-11-27 ENCOUNTER — Ambulatory Visit (INDEPENDENT_AMBULATORY_CARE_PROVIDER_SITE_OTHER): Payer: BC Managed Care – PPO | Admitting: Family Medicine

## 2022-12-01 ENCOUNTER — Ambulatory Visit (INDEPENDENT_AMBULATORY_CARE_PROVIDER_SITE_OTHER): Payer: BC Managed Care – PPO | Admitting: Family Medicine

## 2022-12-01 ENCOUNTER — Other Ambulatory Visit (HOSPITAL_BASED_OUTPATIENT_CLINIC_OR_DEPARTMENT_OTHER): Payer: Self-pay

## 2022-12-01 ENCOUNTER — Encounter (INDEPENDENT_AMBULATORY_CARE_PROVIDER_SITE_OTHER): Payer: Self-pay | Admitting: Family Medicine

## 2022-12-01 VITALS — BP 135/88 | HR 78 | Temp 97.9°F | Ht 71.0 in | Wt >= 6400 oz

## 2022-12-01 DIAGNOSIS — I10 Essential (primary) hypertension: Secondary | ICD-10-CM

## 2022-12-01 DIAGNOSIS — Z7985 Long-term (current) use of injectable non-insulin antidiabetic drugs: Secondary | ICD-10-CM

## 2022-12-01 DIAGNOSIS — E1169 Type 2 diabetes mellitus with other specified complication: Secondary | ICD-10-CM

## 2022-12-01 DIAGNOSIS — Z6841 Body Mass Index (BMI) 40.0 and over, adult: Secondary | ICD-10-CM

## 2022-12-01 DIAGNOSIS — E88819 Insulin resistance, unspecified: Secondary | ICD-10-CM

## 2022-12-01 MED ORDER — TIRZEPATIDE 5 MG/0.5ML ~~LOC~~ SOAJ
5.0000 mg | SUBCUTANEOUS | 0 refills | Status: DC
  Filled 2022-12-01: qty 2, 28d supply, fill #0

## 2022-12-01 NOTE — Assessment & Plan Note (Addendum)
Had taken [facility] in the past but changed due to availability. Reviewed labs from last visit including 123456 Changed to Trulicity 3 mos ago. But has been without x 2 weeks due to short supply. Has more nausea on Trulicity. Has never used Ozempic. Has a glucose monitor to check sugars. Had GI upset from metformin.  She is not taking Jardiance on her med list. Lab Results  Component Value Date   HGBA1C 6.3 (H) 123456     Change Trulicity 1.5 mg to Mounjaro 5 mg [place] weekly.

## 2022-12-01 NOTE — Progress Notes (Signed)
Office: [phone]  /  Fax: [phone]  WEIGHT SUMMARY AND BIOMETRICS  Medical Weight Loss Height: 5' 11"$  (1.803 m) Weight: 409 lb (185.5 kg) Temp: 97.9 F (36.6 C) Pulse Rate: 78 BP: 135/88 SpO2: 98 % Fasting: yes Labs: no Today's Visit #: 2 Weight at Last VIsit: 400lb Weight Lost Since Last Visit: +9  Body Fat %: 52.6 % Fat Mass (lbs): 215.4 lbs Muscle Mass (lbs): 184.2 lbs Total Body Water (lbs): 124.8 lbs Visceral Fat Rating : 21 Starting Date: 11/13/22 Starting Weight: 400lb Total Weight Loss (lbs): 0 lb (0 kg)    HPI  Chief Complaint: OBESITY  Angel Dawson is here to discuss her progress with her obesity treatment plan. She is eating 1800 calories and states she is following her eating plan approximately 30 % of the time. She states she is not  exercising.   Interval History:  Since last office visit she is up 9 lb since her last visit. She hasn't been able to buy all the foods due to finances. She is making poor food choices based on her mood and energy level. She has not had her Trulicity for the past 2 weeks.  Fasting glucose is running 150s- 160s. She plans to restart some Jardiance that she has at home.    Pharmacotherapy: none  PHYSICAL EXAM:  Blood pressure 135/88, pulse 78, temperature 97.9 F (36.6 C), height 5' 11"$  (1.803 m), weight (!) 409 lb (185.5 kg), SpO2 98 %. Body mass index is 57.04 kg/m.  General: She is overweight, cooperative, alert, well developed, and in no acute distress. PSYCH: Has normal mood, affect and thought process.   HEENT: EOMI, sclerae are anicteric. Lungs: Normal breathing effort, no conversational dyspnea. Extremities: No edema.  Neurologic: No gross sensory or motor deficits. No tremors or fasciculations noted.    DIAGNOSTIC DATA REVIEWED:  BMET    Component Value Date/Time   NA 137 11/13/2022 0849   K 4.0 11/13/2022 0849   CL 101 11/13/2022 0849   CO2 23 11/13/2022 0849   GLUCOSE 108 (H) 11/13/2022 0849    GLUCOSE 146 (H) 09/13/2022 0722   BUN 8 11/13/2022 0849   CREATININE 0.77 11/13/2022 0849   CREATININE 1.10 04/13/2013 1748   CALCIUM 9.6 11/13/2022 0849   GFRNONAA >60 09/13/2022 0722   GFRAA >60 06/19/2020 1810   Lab Results  Component Value Date   HGBA1C 6.3 (H) 11/13/2022   HGBA1C 10.7 (H) 08/25/2014   Lab Results  Component Value Date   INSULIN 28.4 (H) 11/13/2022   Lab Results  Component Value Date   TSH 1.310 11/13/2022   CBC    Component Value Date/Time   WBC 8.9 11/13/2022 0849   WBC 24.5 (H) 09/13/2022 0722   RBC 4.65 11/13/2022 0849   RBC 4.64 09/13/2022 0722   HGB 13.8 11/13/2022 0849   HCT 41.5 11/13/2022 0849   PLT 295 11/13/2022 0849   MCV 89 11/13/2022 0849   MCH 29.7 11/13/2022 0849   MCH 29.7 09/13/2022 0722   MCHC 33.3 11/13/2022 0849   MCHC 32.6 09/13/2022 0722   RDW 12.8 11/13/2022 0849   Iron Studies No results found for: "IRON", "TIBC", "FERRITIN", "IRONPCTSAT" Lipid Panel     Component Value Date/Time   CHOL 144 11/13/2022 0849   TRIG 137 11/13/2022 0849   HDL 48 11/13/2022 0849   CHOLHDL 3.0 11/13/2022 0849   CHOLHDL 3 02/10/2022 0953   VLDL 19.6 02/10/2022 0953   LDLCALC 72 11/13/2022 0849   LDLDIRECT  87.0 05/16/2019 1153   Hepatic Function Panel     Component Value Date/Time   PROT 6.8 11/13/2022 0849   ALBUMIN 4.3 11/13/2022 0849   AST 28 11/13/2022 0849   ALT 26 11/13/2022 0849   ALKPHOS 98 11/13/2022 0849   BILITOT 0.3 11/13/2022 0849   BILIDIR 0.1 09/23/2017 1648   IBILI 0.3 09/23/2017 1648      Component Value Date/Time   TSH 1.310 11/13/2022 0849   Nutritional Lab Results  Component Value Date   VD25OH 45.1 11/13/2022   VD25OH 25.27 (L) 08/29/2021   VD25OH 20.26 (L) 02/20/2021     ASSESSMENT AND PLAN  TREATMENT PLAN FOR OBESITY:  Recommended Dietary Goals  Angel Dawson is currently in the action stage of change. As such, her goal is to continue weight management plan. She has agreed to the Category 4  Plan.  Behavioral Intervention  We discussed the following Behavioral Modification Strategies today: increasing lean protein intake, increasing vegetables, increase water intake, work on meal planning and easy cooking plans, and think about ways to increase physical activity.  Additional resources provided today: NA  Recommended Physical Activity Goals  Angel Dawson has been advised to work up to 150 minutes of moderate intensity aerobic activity a week and strengthening exercises 2-3 times per week for cardiovascular health, weight loss maintenance and preservation of muscle mass.   She has agreed to increase physical activity in their day and reduce sedentary time (increase NEAT).    Pharmacotherapy We discussed various medication options to help Angel Dawson with her weight loss efforts and we both agreed to changing   ASSOCIATED CONDITIONS ADDRESSED TODAY  Type 2 diabetes mellitus with other specified complication, unspecified whether long term insulin use ([place]) Assessment & Plan: Had taken Mounjaro in the past but changed due to availability. Reviewed labs from last visit including 123456 Changed to Trulicity 3 mos ago. But has been without x 2 weeks due to short supply. Has more nausea on Trulicity. Has never used Ozempic. Has a glucose monitor to check sugars. Had GI upset from metformin.  She is not taking Jardiance on her med list. Lab Results  Component Value Date   HGBA1C 6.3 (H) 123456     Change Trulicity 1.5 mg to Mounjaro 5 mg  weekly.  Orders: -     Tirzepatide; Inject 5 mg into the skin once a week.  Dispense: 2 mL; Refill: 0  Morbid obesity (HCC)  Essential hypertension Assessment & Plan: Blood pressure is mildly elevated on lisinopril/HCTZ 20/12.5 mg once daily Denies chest pain or headaches  Look for improvements in blood pressure with weight reduction, healthy lifestyle changes and increasing regular exercise   Insulin resistance Assessment &  Plan: Reviewed labs from last visit including Fasting insulin 28.4 consistent with insulin resistance.  We discussed how starches and sweets contribute to hyperinsulinemia, increased hunger and adiposity.  Will be working on reducing intake of refined carbohydrates, added sugar while increasing walking time for insulin sensitivity.   BMI 50.0-59.9, adult ([place])      No follow-ups on file.Marland Kitchen She was informed of the importance of frequent follow up visits to maximize her success with intensive lifestyle modifications for her multiple health conditions.   ATTESTASTION STATEMENTS:  Reviewed by clinician on day of visit: allergies, medications, problem list, medical history, surgical history, family history, social history, and previous encounter notes.  I have personally spent 30 minutes total time today in preparation, patient care, nutritional counseling and documentation for this visit, including the  following: review of clinical lab tests; review of medical tests/procedures/services.      Dell Ponto, DO

## 2022-12-01 NOTE — Assessment & Plan Note (Addendum)
Reviewed labs from last visit including Fasting insulin 28.4 consistent with insulin resistance.  We discussed how starches and sweets contribute to hyperinsulinemia, increased hunger and adiposity.  Will be working on reducing intake of refined carbohydrates, added sugar while increasing walking time for insulin sensitivity.

## 2022-12-01 NOTE — Assessment & Plan Note (Signed)
Blood pressure is mildly elevated on lisinopril/HCTZ 20/12.5 mg once daily Denies chest pain or headaches  Look for improvements in blood pressure with weight reduction, healthy lifestyle changes and increasing regular exercise

## 2022-12-03 DIAGNOSIS — Z1239 Encounter for other screening for malignant neoplasm of breast: Secondary | ICD-10-CM | POA: Diagnosis not present

## 2022-12-03 DIAGNOSIS — N951 Menopausal and female climacteric states: Secondary | ICD-10-CM | POA: Diagnosis not present

## 2022-12-03 DIAGNOSIS — N9985 Post endometrial ablation syndrome: Secondary | ICD-10-CM | POA: Diagnosis not present

## 2022-12-03 DIAGNOSIS — Z01411 Encounter for gynecological examination (general) (routine) with abnormal findings: Secondary | ICD-10-CM | POA: Diagnosis not present

## 2022-12-03 DIAGNOSIS — Z01419 Encounter for gynecological examination (general) (routine) without abnormal findings: Secondary | ICD-10-CM | POA: Diagnosis not present

## 2022-12-03 DIAGNOSIS — Z6841 Body Mass Index (BMI) 40.0 and over, adult: Secondary | ICD-10-CM | POA: Diagnosis not present

## 2022-12-03 LAB — HM MAMMOGRAPHY

## 2022-12-06 NOTE — Progress Notes (Unsigned)
Chief Complaint:   Angel Dawson (MR# [id number]) is a 42 y.o. female who presents for evaluation and treatment of Angel and related comorbidities. Current BMI is Body mass index is 55.79 kg/m. Angel Dawson has been struggling with her weight for many years and has been unsuccessful in either losing weight, maintaining weight loss, or reaching her healthy weight goal.  Angel Dawson is currently in the action stage of change and ready to dedicate time achieving and maintaining a healthier weight. Angel Dawson is interested in becoming our patient and working on intensive lifestyle modifications including (but not limited to) diet and exercise for weight loss.  Angel Dawson lives alone and works a fairly sedentary job. She has been obese since childhood. She feels like she may be going through hormonal changes. Pt not sleeping well (using CPAP). She is getting night sweats. Status post endometrial ablation in 2017. Pt has an OB/GYN to follow up with. She sometimes skips meals then overeats things like fried foods. Pt denies sugar cravings.  Angel Dawson's habits were reviewed today and are as follows: she has been heavy most of her life, she started gaining weight in 4th grade, her heaviest weight ever was 436 pounds, she has significant food cravings issues, she snacks frequently in the evenings, she wakes up frequently in the middle of the night to eat, she is frequently drinking liquids with calories, she frequently makes poor food choices, she has problems with excessive hunger, she frequently eats larger portions than normal, she has binge eating behaviors, and she struggles with emotional eating.  Depression Screen Angel Dawson's Food and Mood (modified PHQ-9) score was 17.  Subjective:   1. Other fatigue Emmee admits to daytime somnolence and admits to waking up still tired. Patient has a history of symptoms of daytime fatigue and morning fatigue. [name] generally gets  5-7  hours of sleep per  night, and states that she has poor sleep quality. Snoring is present WITHOUT CPAP. Apneic episodes are present. Epworth Sleepiness Score is 16.  EKG reviewed from 09/13/2022- sinus tachy, 106 bmp  2. SOBOE (shortness of breath on exertion) Angel Dawson notes increasing shortness of breath with exercising and seems to be worsening over time with weight gain. She notes getting out of breath sooner with activity than she used to. This has gotten worse recently. Angel Dawson denies shortness of breath at rest or orthopnea. Expected BMR 2773 Actual BMR 3010= better than expected  3. Essential hypertension BP elevated today. Medication/s: lisinopril-HCTZ 20/25 mg daily Rateel didn't take meds today.  4. Type 2 diabetes mellitus with other specified complication, unspecified whether long term insulin use (HCC) Medication/s: Trulicity 1.5 mg subQ weekly History of pancreatitis Pt reports nausea with Trulicity. She had some diarrhea on Metformin. Morning fasting glucose: 110's. Harvey has used Mounjaro in the past. Has not seen weight loss.  5. Other chronic pain Back and knee pain limits physical activity. Pt with pain for 16 years after a fall in the bathtub. Weight gin has worsened her pain. Danikka can walk, do some weight training, and water exercises.  6. Other hyperlipidemia Medication/s: rosuvastatin 5 mg daily Pt denies myalgias.  7. Other depression with emotional eating Bariatric PHQ-9: 17 Pt has a good support system.  Assessment/Plan:   1. Other fatigue Angel Dawson does feel that her weight is causing her energy to be lower than it should be. Fatigue may be related to Angel, depression or many other causes. Labs will be ordered, and in the meanwhile, [place]  will focus on self care including making healthy food choices, increasing physical activity and focusing on stress reduction.  Lab/Orders today or future: - VITAMIN D 25 Hydroxy (Vit-D Deficiency, Fractures) - TSH - T4, free -  Lipid panel - Insulin, random - Hemoglobin A1c - Folate - Comprehensive metabolic panel - Vitamin 123456 - CBC with Differential/Platelet  2. SOBOE (shortness of breath on exertion) Angel Dawson does feel that she gets out of breath more easily that she used to when she exercises. Angel Dawson's shortness of breath appears to be Angel related and exercise induced. She has agreed to work on weight loss and gradually increase exercise to treat her exercise induced shortness of breath. Will continue to monitor closely.  3. Essential hypertension Take BP meds daily as directed.  4. Type 2 diabetes mellitus with other specified complication, unspecified whether long term insulin use (HCC) Continue Trulicity 1.5 mg weekly per PCP, but consider alternatives due to nausea.  Lab/Orders today or future: - Insulin, random - Hemoglobin A1c  5. Other chronic pain Check into water aerobics options.  6. Other hyperlipidemia Reduce intake of saturated fats.  Lab/Orders today or future: - Lipid panel  7. Other depression with emotional eating Discussed stress reduction and mindful eating.  8. Depression screen Angel Dawson had a positive depression screening. Depression is commonly associated with Angel and often results in emotional eating behaviors. We will monitor this closely and work on CBT to help improve the non-hunger eating patterns. Referral to Psychology may be required if no improvement is seen as she continues in our clinic.  9. Morbid Angel (Angel Dawson) 10. BMI 50.0-59.9, adult (HCC) Handout: 200 Calorie Snack Can add one additional protein shake daily.  Angel Dawson is currently in the action stage of change and her goal is to continue with weight loss efforts. I recommend Angel Dawson begin the structured treatment plan as follows:  She has agreed to the Category 4 Plan.  Exercise goals:  Check into gym options.    Behavioral modification strategies: increasing lean protein intake, increasing  vegetables, increasing water intake, decreasing eating out, no skipping meals, meal planning and cooking strategies, keeping healthy foods in the home, and planning for success.  She was informed of the importance of frequent follow-up visits to maximize her success with intensive lifestyle modifications for her multiple health conditions. She was informed we would discuss her lab results at her next visit unless there is a critical issue that needs to be addressed sooner. Tatyana agreed to keep her next visit at the agreed upon time to discuss these results.  Objective:   Blood pressure (!) 153/90, pulse 90, temperature 98.1 F (36.7 C), height '5\' 11"'$  (1.803 m), weight (!) 400 lb (181.4 kg), SpO2 97 %. Body mass index is 55.79 kg/m.  EKG: Sinus rhythm, rate 106- tachycardia (09/13/2022).  Indirect Calorimeter completed today shows a VO2 of 436 and a REE of 3010.  Her calculated basal metabolic rate is 0000000 thus her basal metabolic rate is better than expected.  General: Cooperative, alert, well developed, in no acute distress. HEENT: Conjunctivae and lids unremarkable. Cardiovascular: Regular rhythm.  Lungs: Normal work of breathing. Neurologic: No focal deficits.   Lab Results  Component Value Date   CREATININE 0.77 11/13/2022   BUN 8 11/13/2022   NA 137 11/13/2022   K 4.0 11/13/2022   CL 101 11/13/2022   CO2 23 11/13/2022   Lab Results  Component Value Date   ALT 26 11/13/2022   AST 28 11/13/2022  ALKPHOS 98 11/13/2022   BILITOT 0.3 11/13/2022   Lab Results  Component Value Date   HGBA1C 6.3 (H) 11/13/2022   HGBA1C 6.5 08/20/2022   HGBA1C 6.5 12/23/2021   HGBA1C 6.0 09/03/2021   HGBA1C 8.3 (H) 05/06/2021   Lab Results  Component Value Date   INSULIN 28.4 (H) 11/13/2022   Lab Results  Component Value Date   TSH 1.310 11/13/2022   Lab Results  Component Value Date   CHOL 144 11/13/2022   HDL 48 11/13/2022   LDLCALC 72 11/13/2022   LDLDIRECT 87.0 05/16/2019    TRIG 137 11/13/2022   CHOLHDL 3.0 11/13/2022   Lab Results  Component Value Date   WBC 8.9 11/13/2022   HGB 13.8 11/13/2022   HCT 41.5 11/13/2022   MCV 89 11/13/2022   PLT 295 11/13/2022    Attestation Statements:   Reviewed by clinician on day of visit: allergies, medications, problem list, medical history, surgical history, family history, social history, and previous encounter notes.  Time spent on visit including pre-visit chart review and post-visit charting and care was 40 minutes.   I, Kathlene November, BS, CMA, am acting as transcriptionist for Loyal Gambler, DO.   I have reviewed the above documentation for accuracy and completeness, and I agree with the above. - ***

## 2022-12-09 DIAGNOSIS — G4733 Obstructive sleep apnea (adult) (pediatric): Secondary | ICD-10-CM | POA: Diagnosis not present

## 2022-12-09 DIAGNOSIS — F4320 Adjustment disorder, unspecified: Secondary | ICD-10-CM | POA: Diagnosis not present

## 2022-12-12 ENCOUNTER — Other Ambulatory Visit (HOSPITAL_BASED_OUTPATIENT_CLINIC_OR_DEPARTMENT_OTHER): Payer: Self-pay

## 2022-12-17 DIAGNOSIS — R61 Generalized hyperhidrosis: Secondary | ICD-10-CM | POA: Diagnosis not present

## 2022-12-23 ENCOUNTER — Encounter (INDEPENDENT_AMBULATORY_CARE_PROVIDER_SITE_OTHER): Payer: Self-pay | Admitting: Family Medicine

## 2022-12-23 ENCOUNTER — Ambulatory Visit (INDEPENDENT_AMBULATORY_CARE_PROVIDER_SITE_OTHER): Payer: BC Managed Care – PPO | Admitting: Family Medicine

## 2022-12-23 VITALS — BP 144/88 | HR 96 | Temp 98.8°F | Ht 71.0 in | Wt >= 6400 oz

## 2022-12-23 DIAGNOSIS — G4733 Obstructive sleep apnea (adult) (pediatric): Secondary | ICD-10-CM | POA: Diagnosis not present

## 2022-12-23 DIAGNOSIS — F4320 Adjustment disorder, unspecified: Secondary | ICD-10-CM | POA: Diagnosis not present

## 2022-12-23 DIAGNOSIS — E1169 Type 2 diabetes mellitus with other specified complication: Secondary | ICD-10-CM | POA: Diagnosis not present

## 2022-12-23 DIAGNOSIS — I10 Essential (primary) hypertension: Secondary | ICD-10-CM | POA: Diagnosis not present

## 2022-12-23 DIAGNOSIS — Z6841 Body Mass Index (BMI) 40.0 and over, adult: Secondary | ICD-10-CM

## 2022-12-23 NOTE — Assessment & Plan Note (Signed)
Plans to improve compliance using CPAP nightly Energy levels are improving Getting 6-8 hrs of sleep at night  Aim for 7-8 hrs of sleep with CPAP nightly

## 2022-12-23 NOTE — Assessment & Plan Note (Signed)
Blood pressure is elevated today.  She reports good compliance on lisinopril/HCTZ 20/12.5 mg once daily.  She denies adverse side effects.  She will start monitoring her home blood pressure readings at rest 1-2 times a week.

## 2022-12-23 NOTE — Assessment & Plan Note (Addendum)
Blood sugars are doing better on Jardiance 10 mg daily Getting a new CGM to monitor her sugars Has reduced intake of sweets and starches Off Trulicity; Mounjaro not covered  Lab Results  Component Value Date   HGBA1C 6.3 (H) 11/13/2022

## 2022-12-23 NOTE — Progress Notes (Signed)
Office: [phone]  /  Fax: [phone]  WEIGHT SUMMARY AND BIOMETRICS  Vitals Temp: 98.8 F (37.1 C) BP: (!) 144/88 Pulse Rate: 96 SpO2: 95 %   Anthropometric Measurements Height: '5\' 11"'$  (1.803 m) Weight: (!) 405 lb (183.7 kg) BMI (Calculated): 56.51 Weight at Last Visit: 409lb Weight Lost Since Last Visit: 4lb Starting Weight: 400lb   Body Composition  Body Fat %: 53.4 % Fat Mass (lbs): 216.2 lbs Muscle Mass (lbs): 179.4 lbs Total Body Water (lbs): 126.8 lbs Visceral Fat Rating : 21   Other Clinical Data Fasting: no Labs: no Today's Visit #: 3 Starting Date: 11/13/22   HPI  Chief Complaint: OBESITY  Angel Dawson is here to discuss her progress with her obesity treatment plan. She is on the the Category 2 Plan and states she is following her eating plan approximately 70 % of the time. She states she is exercising 45 minutes 2 times per week.   Interval History:  Since last office visit she is down 4 lb She had a massage and is walking more with reduced back pain She has lost 14.4 lb of body fat in the past 6 weeks and is up 5 lb of muscle mass in 6 weeks Blood sugars are improving Started walking outdoors 2 x a week and has resistance bands [place] gym information given Support system is good Unable to get Mounjaro  Blood sugars are improving  Pharmacotherapy: none  PHYSICAL EXAM:  Blood pressure (!) 144/88, pulse 96, temperature 98.8 F (37.1 C), height '5\' 11"'$  (1.803 m), weight (!) 405 lb (183.7 kg), SpO2 95 %. Body mass index is 56.49 kg/m.  General: She is overweight, cooperative, alert, well developed, and in no acute distress. PSYCH: Has normal mood, affect and thought process.   Lungs: Normal breathing effort, no conversational dyspnea.   ASSESSMENT AND PLAN  TREATMENT PLAN FOR OBESITY:  Recommended Dietary Goals  Ara is currently in the action stage of change. As such, her goal is to continue weight management plan. She has  agreed to keeping a food journal and adhering to recommended goals of 2000 calories and 150 g of protein.  Behavioral Intervention  We discussed the following Behavioral Modification Strategies today: increasing lean protein intake, increasing vegetables, avoiding skipping meals, increasing water intake, work on meal planning and easy cooking plans, work on tracking and journaling calories using tracking App, decreasing eating out, consumption of processed foods, and making healthy choices when eating convenient foods, and reading food labels .  Additional resources provided today: NA  Recommended Physical Activity Goals  Elice has been advised to work up to 150 minutes of moderate intensity aerobic activity a week and strengthening exercises 2-3 times per week for cardiovascular health, weight loss maintenance and preservation of muscle mass.   She has agreed to Will continue regular aerobic exercise 30-45 minutes, 4 times per week. Chosen activity walking.  Pharmacotherapy changes for the treatment of obesity: none  ASSOCIATED CONDITIONS ADDRESSED TODAY  Type 2 diabetes mellitus with other specified complication, unspecified whether long term insulin use (HCC) Assessment & Plan: Blood sugars are doing better on Jardiance 10 mg daily Getting a new CGM to monitor her sugars Has reduced intake of sweets and starches Off Trulicity; Mounjaro not covered  Lab Results  Component Value Date   HGBA1C 6.3 (H) 11/13/2022      Morbid obesity ([place]) Assessment & Plan: Patient has a net weight gain of 5 pounds in the past 5 weeks of medically  supervised weight management.  She has seen some muscle gain and body fat loss compared to her first visit.  She is motivated to stay on track with healthy eating, meal planning and more regular exercise.  Encouraged dietary logging with 2000 kcal/day which should include 150 g of protein daily.  She declined the need for antiobesity medication.  She does  qualify for bariatric surgery if unable to see success in the coming months.   Essential hypertension Assessment & Plan: Blood pressure is elevated today.  She reports good compliance on lisinopril/HCTZ 20/12.5 mg once daily.  She denies adverse side effects.  She will start monitoring her home blood pressure readings at rest 1-2 times a week.   OSA on CPAP Assessment & Plan: Plans to improve compliance using CPAP nightly Energy levels are improving Getting 6-8 hrs of sleep at night  Aim for 7-8 hrs of sleep with CPAP nightly   BMI 50.0-59.9, adult ([place])      She was informed of the importance of frequent follow up visits to maximize her success with intensive lifestyle modifications for her multiple health conditions.   ATTESTASTION STATEMENTS:  Reviewed by clinician on day of visit: allergies, medications, problem list, medical history, surgical history, family history, social history, and previous encounter notes pertinent to obesity diagnosis.   I have personally spent 30 minutes total time today in preparation, patient care, nutritional counseling and documentation for this visit, including the following: review of clinical lab tests; review of medical tests/procedures/services.      Dell Ponto, DO DABFM, DABOM [facility] [address] [place], [place] [address] [phone]

## 2022-12-23 NOTE — Assessment & Plan Note (Signed)
Patient has a net weight gain of 5 pounds in the past 5 weeks of medically supervised weight management.  She has seen some muscle gain and body fat loss compared to her first visit.  She is motivated to stay on track with healthy eating, meal planning and more regular exercise.  Encouraged dietary logging with 2000 kcal/day which should include 150 g of protein daily.  She declined the need for antiobesity medication.  She does qualify for bariatric surgery if unable to see success in the coming months.

## 2023-01-06 DIAGNOSIS — F4320 Adjustment disorder, unspecified: Secondary | ICD-10-CM | POA: Diagnosis not present

## 2023-01-12 ENCOUNTER — Encounter: Payer: Self-pay | Admitting: Family Medicine

## 2023-01-20 ENCOUNTER — Ambulatory Visit: Payer: BC Managed Care – PPO | Admitting: Podiatry

## 2023-01-20 DIAGNOSIS — L819 Disorder of pigmentation, unspecified: Secondary | ICD-10-CM | POA: Diagnosis not present

## 2023-01-20 DIAGNOSIS — L603 Nail dystrophy: Secondary | ICD-10-CM | POA: Diagnosis not present

## 2023-01-20 DIAGNOSIS — R21 Rash and other nonspecific skin eruption: Secondary | ICD-10-CM

## 2023-01-20 DIAGNOSIS — N920 Excessive and frequent menstruation with regular cycle: Secondary | ICD-10-CM | POA: Insufficient documentation

## 2023-01-20 NOTE — Progress Notes (Signed)
Subjective:   Patient ID: Angel Dawson, female   DOB: 42 y.o.   MRN: [id number]   HPI Chief Complaint  Patient presents with   Nail Problem    Right hallux nail issues with fungus pt stated that she is a diabetic     42 year old female presents the office for above concerns.  She states she started to notice a rash on the right foot great toe going on the side of her foot she started over-the-counter Lamisil cream and is not helping.  She is also concerned that her toenail as it is still discolored she has had issues for quite some time.  This the nail came loose.  Has remnants that she is causing discomfort.  No swelling redness or drainage.  No lesions.  No other concerns.    She states that all of her nail beds are darker.    Review of Systems  All other systems reviewed and are negative.  Past Medical History:  Diagnosis Date   Abdominal pain    related to gallstones   Achilles tendonitis, bilateral    Back pain    Bronchitis    02/17   Chest pain    Constipation    Diabetes (HCC)    Type II   DKA (diabetic ketoacidoses) 08/25/2014   Gallstones    GERD (gastroesophageal reflux disease)    Headache    migraines   Hypertension    Joint pain    Lattice degeneration, both eyes    Palpitations    Pancreatitis    PCOS (polycystic ovarian syndrome)    Sinus congestion    occ. uses OTC meds for this   Sleep apnea    Vitamin D deficiency     Past Surgical History:  Procedure Laterality Date   CHOLECYSTECTOMY N/A 06/22/2013   Procedure: LAPAROSCOPIC CHOLECYSTECTOMY ;  Surgeon: Ernestene Mention, MD;  Location: [facility];  Service: General;  Laterality: N/A;   DILITATION & CURRETTAGE/HYSTROSCOPY WITH HYDROTHERMAL ABLATION N/A 01/03/2016   Procedure: DILATATION & CURETTAGE/HYSTEROSCOPY WITH HYDROTHERMAL ABLATION;  Surgeon: Jaymes Graff, MD;  Location: [facility];  Service: Gynecology;  Laterality: N/A;  HTA rep will be attend confirmed by office 12/12/15    NASAL SEPTOPLASTY  W/ TURBINOPLASTY Bilateral 01/11/2021   Procedure: NASAL SEPTOPLASTY WITH TURBINATE REDUCTION;  Surgeon: Osborn Coho, MD;  Location: [facility] OR;  Service: ENT;  Laterality: Bilateral;   TONSILLECTOMY       Current Outpatient Medications:    amLODipine (NORVASC) 5 MG tablet, Take by mouth., Disp: , Rfl:    fluticasone (FLONASE) 50 MCG/ACT nasal spray, 2 sprays Once Daily., Disp: , Rfl:    gabapentin (NEURONTIN) 300 MG capsule, gabapentin 300 mg capsule TAKE ONE CAPSULE BY MOUTH AT BEDTIME, Disp: , Rfl:    Probiotic, Lactobacillus, CAPS, Take by mouth., Disp: , Rfl:    tirzepatide (MOUNJARO) 5 MG/0.5ML Pen, INJECT 5 MG UNDER THE SKIN ONCE A WEEK, Disp: , Rfl:    TRULICITY 0.75 MG/0.5ML SOPN, Inject 0.75 mg into the skin once a week., Disp: , Rfl:    albuterol (VENTOLIN HFA) 108 (90 Base) MCG/ACT inhaler, Inhale 1-2 puffs into the lungs every 6 (six) hours as needed for wheezing or shortness of breath., Disp: 8 g, Rfl: 0   Blood Glucose Monitoring Suppl (BLOOD GLUCOSE METER KIT AND SUPPLIES), Dispense based on patient and insurance preference. Use up to four times daily as directed. (FOR ICD-9 250.00, 250.01)., Disp: 1 each, Rfl: 0   Cholecalciferol 1.25  MG (50000 UT) capsule, , Disp: , Rfl:    Continuous Blood Gluc Sensor (FREESTYLE LIBRE 3 SENSOR) MISC, APPLY SENSOR ON UPPER ARM EVERY 14 DAYS FOR CONTINUOUS GLUCOSE MONITORING, Disp: 2 each, Rfl: 2   Continuous Blood Gluc Sensor (FREESTYLE LIBRE 3 SENSOR) MISC, Use for continuous blood glucose monitoring, Disp: 6 each, Rfl: 3   empagliflozin (JARDIANCE) 10 MG TABS tablet, TAKE 1 TABLET(10 MG) BY MOUTH DAILY WITH BREAKFAST, Disp: 90 tablet, Rfl: 3   glucose blood (ONETOUCH VERIO) test strip, USE TO TEST BLOOD SUGAR ONCE DAILY, Disp: 100 strip, Rfl: 3   lisinopril-hydrochlorothiazide (ZESTORETIC) 20-25 MG tablet, Take 1 tablet by mouth daily., Disp: 90 tablet, Rfl: 3   Multiple Vitamins-Minerals (MULTIVITAMIN WITH MINERALS) tablet, Take 1 tablet by  mouth daily. With Diabetic Support, Disp: , Rfl:    OneTouch Delica Lancets 33G MISC, Use once daily to check glucose., Disp: 100 each, Rfl: 1   Probiotic Product (PROBIOTIC-10 PO), Take 1 capsule by mouth daily. Takes [employer] Digestive Probiotic, Disp: , Rfl:    rosuvastatin (CRESTOR) 5 MG tablet, Take 1 tablet (5 mg total) by mouth daily. Start with every other day for 1 week and then once daily, Disp: 90 tablet, Rfl: 3  No Known Allergies         Objective:  Physical Exam  General: AAO x3, NAD  Dermatological: Right hallux nail is hypertrophic, dystrophic revealed, brown discoloration.  There is slight discoloration on the medial nail border but no extension of hyperpigmentation surrounding skin.  There is no edema, erythema or signs of infection.  There is mild peeling skin present proximal to the hallux, medial side of foot previously improved compared with picture that she shows me.  There is no drainage or pus.  Vascular: Dorsalis Pedis artery and Posterior Tibial artery pedal pulses are 2/4 bilateral with immedate capillary fill time. There is no pain with calf compression, swelling, warmth, erythema.   Neruologic: Grossly intact via light touch bilateral.   Musculoskeletal: No gross boney pedal deformities bilateral. No pain, crepitus, or limitation noted with foot and ankle range of motion bilateral. Muscular strength 5/5 in all groups tested bilateral.  Gait: Unassisted, Nonantalgic.       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  Ultimately we both decided to proceed with total nail avulsion but understands about a guarantee.  When I remove the nail will biopsy this as well and consider further treatment for  nail fungus pending the results.  She is getting ready go to general today we will plan on doing tomorrow when she can have time to recover not to drive. -Continue Lamisil cream for now.  Vivi Barrack DPM

## 2023-01-21 ENCOUNTER — Ambulatory Visit (INDEPENDENT_AMBULATORY_CARE_PROVIDER_SITE_OTHER): Payer: BC Managed Care – PPO | Admitting: Podiatry

## 2023-01-21 DIAGNOSIS — L819 Disorder of pigmentation, unspecified: Secondary | ICD-10-CM

## 2023-01-21 DIAGNOSIS — B353 Tinea pedis: Secondary | ICD-10-CM | POA: Diagnosis not present

## 2023-01-21 DIAGNOSIS — L814 Other melanin hyperpigmentation: Secondary | ICD-10-CM | POA: Diagnosis not present

## 2023-01-21 DIAGNOSIS — L603 Nail dystrophy: Secondary | ICD-10-CM

## 2023-01-21 DIAGNOSIS — B351 Tinea unguium: Secondary | ICD-10-CM | POA: Diagnosis not present

## 2023-01-21 DIAGNOSIS — L810 Postinflammatory hyperpigmentation: Secondary | ICD-10-CM | POA: Diagnosis not present

## 2023-01-22 NOTE — Progress Notes (Signed)
Subjective: No chief complaint on file.  42 year old female presents the office today for nail removal, biopsy of the right big toe.  No changes since yesterday.  No other concerns.  Objective: AAO x3, NAD DP/PT pulses palpable bilaterally, CRT less than 3 seconds Symptoms are unchanged. No pain with calf compression, swelling, warmth, erythema  Assessment: Right hallux onychodystrophy, hyperpigmentation  Plan: -All treatment options discussed with the patient including all alternatives, risks, complications.  -At this time, recommended total nail removal without chemical matricectomy to the right hallux. Risks and complications were discussed with the patient for which they understand and  verbally consent to the procedure. Under sterile conditions a total of 3 mL of a mixture of 2% lidocaine plain and 0.5% Marcaine plain was infiltrated in a hallux block fashion. Once anesthetized, the skin was prepped in sterile fashion. A tourniquet was then applied. Next the right hallux nail was removed in total with remove all nail borders.  There was hyperpigmented skin present underneath the nail which is sent for separate specimen to pathology.  The nail was also sent to pathology.  Once the nail was  Removed, the area was debrided and the underlying skin was intact. The area was irrigated and hemostasis was obtained.  A dry sterile dressing was applied. After application of the dressing the tourniquet was removed and there is found to be an immediate capillary refill time to the digit. The patient tolerated the procedure well any complications. Post procedure instructions were discussed the patient for which he verbally understood. Follow-up in one week for nail check or sooner if any problems are to arise. Discussed signs/symptoms of worsening infection and directed to call the office immediately should any occur or go directly to the emergency room. In the meantime, encouraged to call the office with any  questions, concerns, changes symptoms. -Patient encouraged to call the office with any questions, concerns, change in symptoms.   Vivi Barrack DPM

## 2023-01-27 ENCOUNTER — Ambulatory Visit (INDEPENDENT_AMBULATORY_CARE_PROVIDER_SITE_OTHER): Payer: BC Managed Care – PPO | Admitting: Family Medicine

## 2023-01-27 ENCOUNTER — Encounter (INDEPENDENT_AMBULATORY_CARE_PROVIDER_SITE_OTHER): Payer: Self-pay | Admitting: Family Medicine

## 2023-01-27 VITALS — BP 143/82 | HR 90 | Temp 98.7°F | Ht 71.0 in | Wt >= 6400 oz

## 2023-01-27 DIAGNOSIS — Z7984 Long term (current) use of oral hypoglycemic drugs: Secondary | ICD-10-CM

## 2023-01-27 DIAGNOSIS — I10 Essential (primary) hypertension: Secondary | ICD-10-CM | POA: Diagnosis not present

## 2023-01-27 DIAGNOSIS — Z7985 Long-term (current) use of injectable non-insulin antidiabetic drugs: Secondary | ICD-10-CM

## 2023-01-27 DIAGNOSIS — E1169 Type 2 diabetes mellitus with other specified complication: Secondary | ICD-10-CM

## 2023-01-27 DIAGNOSIS — Z6841 Body Mass Index (BMI) 40.0 and over, adult: Secondary | ICD-10-CM

## 2023-01-27 DIAGNOSIS — F4320 Adjustment disorder, unspecified: Secondary | ICD-10-CM | POA: Diagnosis not present

## 2023-01-27 NOTE — Progress Notes (Signed)
Office: [phone]  /  Fax: [phone]  WEIGHT SUMMARY AND BIOMETRICS  Starting Date: 11/13/22  Starting Weight: 400lb   Weight Lost Since Last Visit: 0   Vitals Temp: 98.7 F (37.1 C) BP: (!) 143/82 Pulse Rate: 90 SpO2: 100 %   Body Composition  Body Fat %: 54.2 % Fat Mass (lbs): 220.4 lbs Muscle Mass (lbs): 176.8 lbs Total Body Water (lbs): 131 lbs Visceral Fat Rating : 21   HPI  Chief Complaint: OBESITY  Angel Dawson is here to discuss her progress with her obesity treatment plan. She is on the keeping a food journal and adhering to recommended goals of 2000 calories and 150 protein and states she is following her eating plan approximately 60 % of the time. She states she is exercising 30 minutes 1-2 times per week.   Interval History:  Since last office visit she is up 1 lb This gives her a net weight gain of 6 lb in the past 2 mos She still tends to skip meals and over snacks She is going back on Trulicity by PCP since [facility] was not covered by her insurance She traveled to [place] and got off track with her meal plan She hasn't been able to walk much since she had a toenail removed She plan to start doing home exercises with resistance bands, body weight training  Pharmacotherapy: Trulicity per PCP  PHYSICAL EXAM:  Blood pressure (!) 143/82, pulse 90, temperature 98.7 F (37.1 C), height  (1.803 m), weight (!) 406 lb (184.2 kg), SpO2 100 %. Body mass index is 56.63 kg/m.  General: She is overweight, cooperative, alert, well developed, and in no acute distress. PSYCH: Has normal mood, affect and thought process.   Lungs: Normal breathing effort, no conversational dyspnea.   ASSESSMENT AND PLAN  TREATMENT PLAN FOR OBESITY:  Recommended Dietary Goals  Angel Dawson is currently in the action stage of change. As such, her goal is to continue weight management plan. She has agreed to keeping a food journal and adhering to recommended goals of  2000 calories and 150 g of  protein.  Behavioral Intervention  We discussed the following Behavioral Modification Strategies today: increasing lean protein intake, increasing vegetables, increasing fiber rich foods, avoiding skipping meals, increasing water intake, work on meal planning and preparation, work on tracking and journaling calories using tracking application, decreasing eating out or consumption of processed foods, and making healthy choices when eating convenient foods, avoiding temptations and identifying enticing environmental cues, continue to work on implementation of reduced calorie nutritional plan, planning for success, and ways to avoid boredom eating.  Additional resources provided today: NA  Recommended Physical Activity Goals  Angel Dawson has been advised to work up to 150 minutes of moderate intensity aerobic activity a week and strengthening exercises 2-3 times per week for cardiovascular health, weight loss maintenance and preservation of muscle mass.   She has agreed to Work on scheduling and tracking physical activity.   Pharmacotherapy changes for the treatment of obesity: Trulicity per PCP  ASSOCIATED CONDITIONS ADDRESSED TODAY  Essential hypertension Assessment & Plan: BP is elevated today.  She has recently cut her lisinopril/ HCTZ to 10/12.5 mg daily. Checking home BP reading and reports that her BP is running 'higher' She is back on Jardiance for her T2DM and notes that her BP usually runs lower with this  Continue to monitor home BP with a goal <130/80 Follow up with PCP to help adjust medications   Type 2 diabetes mellitus with  other specified complication, without long-term current use of insulin Assessment & Plan: Lab Results  Component Value Date   HGBA1C 6.3 (H) 11/13/2022   She is back on Jardiance 10 mg daily and she has picked up Trulicity 0.75 mg to start back on AM blood sugars have been running 120s She is craving sweets and  starches  Plan:  begin Trulicity 0.75 mg injection weekly per PCP along with Jardiance 10 mg daily Continue to work on reducing intake of sugar and refined carbohydrates Work on increasing walking time     Morbid obesity Assessment & Plan: Pt has struggled to see weight reduction and comply with her reduced calorie dietary plan including logging Emotional eating and high stress levels have been a factor with her adherence  Plan: Look for improvements in satiety on Trulicity Set up CBT with Dr Dewaine Conger to address emotional eating   BMI 50.0-59.9, adult      She was informed of the importance of frequent follow up visits to maximize her success with intensive lifestyle modifications for her multiple health conditions.   ATTESTASTION STATEMENTS:  Reviewed by clinician on day of visit: allergies, medications, problem list, medical history, surgical history, family history, social history, and previous encounter notes pertinent to obesity diagnosis.   I have personally spent 30 minutes total time today in preparation, patient care, nutritional counseling and documentation for this visit, including the following: review of clinical lab tests; review of medical tests/procedures/services.      [name], DO DABFM, DABOM [facility] [address] [place], [place] [address] [phone]

## 2023-01-27 NOTE — Assessment & Plan Note (Signed)
Pt has struggled to see weight reduction and comply with her reduced calorie dietary plan including logging Emotional eating and high stress levels have been a factor with her adherence  Plan: Look for improvements in satiety on Trulicity Set up CBT with Dr Dewaine Conger to address emotional eating

## 2023-01-27 NOTE — Assessment & Plan Note (Addendum)
Lab Results  Component Value Date   HGBA1C 6.3 (H) 11/13/2022   She is back on Jardiance 10 mg daily and she has picked up Trulicity 0.75 mg to start back on AM blood sugars have been running 120s She is craving sweets and starches  Plan:  begin Trulicity 0.75 mg injection weekly per PCP along with Jardiance 10 mg daily Continue to work on reducing intake of sugar and refined carbohydrates Work on increasing walking time

## 2023-01-27 NOTE — Assessment & Plan Note (Addendum)
BP is elevated today.  She has recently cut her lisinopril/ HCTZ to 10/12.5 mg daily. Checking home BP reading and reports that her BP is running 'higher' She is back on Jardiance for her T2DM and notes that her BP usually runs lower with this  Continue to monitor home BP with a goal <130/80 Follow up with PCP to help adjust medications

## 2023-02-04 IMAGING — MR MR ABDOMEN WO/W CM MRCP
19 series · 48 of 48 positions shown · IV contrast (20 ml multihance)
Comparison: 04/18/2013

CLINICAL DATA: Left upper quadrant pain for approximately 1 month.
Suspected pancreatitis. Prior cholecystectomy.

EXAM:
MRI ABDOMEN WITHOUT AND WITH CONTRAST (INCLUDING MRCP)
TECHNIQUE: Multiplanar multisequence MR imaging of the abdomen was performed
both before and after the administration of intravenous contrast.
Heavily T2-weighted images of the biliary and pancreatic ducts were
obtained, and three-dimensional MRCP images were rendered by post
processing.
CONTRAST:  20mL MULTIHANCE GADOBENATE DIMEGLUMINE 529 MG/ML IV SOLN

[Series 3: T2 · coronal · 5.0mm · 1.76mm/px · 1 of 40 slices shown (1 of 4)]
[im 1/40]
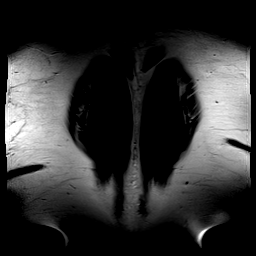

[Series 4: T1 · axial · 3.5mm · 1.41mm/px · z∈[-287,+18]mm · 5 of 176 slices shown]
[im 1/176]
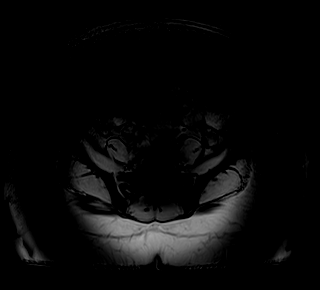
[im 44/176]
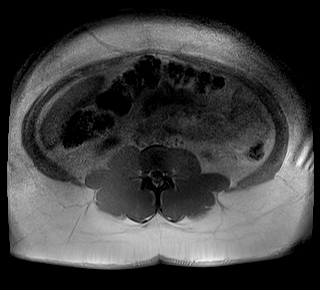
[im 88/176]
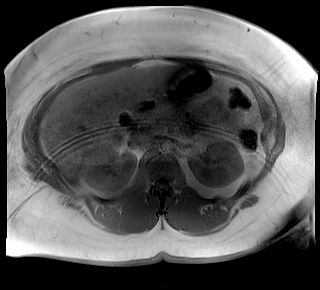
[im 132/176]
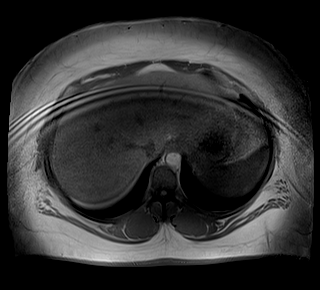
[im 176/176]
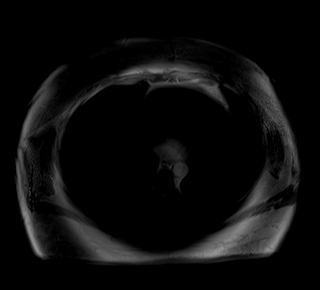

[Series 7: T2 · axial · 5.0mm · 1.76mm/px · 1 of 52 slices shown (2 of 4)]
[im 1/52]
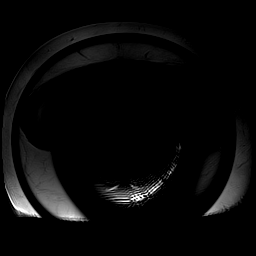

[Series 10: MRCP · coronal · 1.0mm · 0.49mm/px · 1 of 64 slices shown]
[im 1/64]
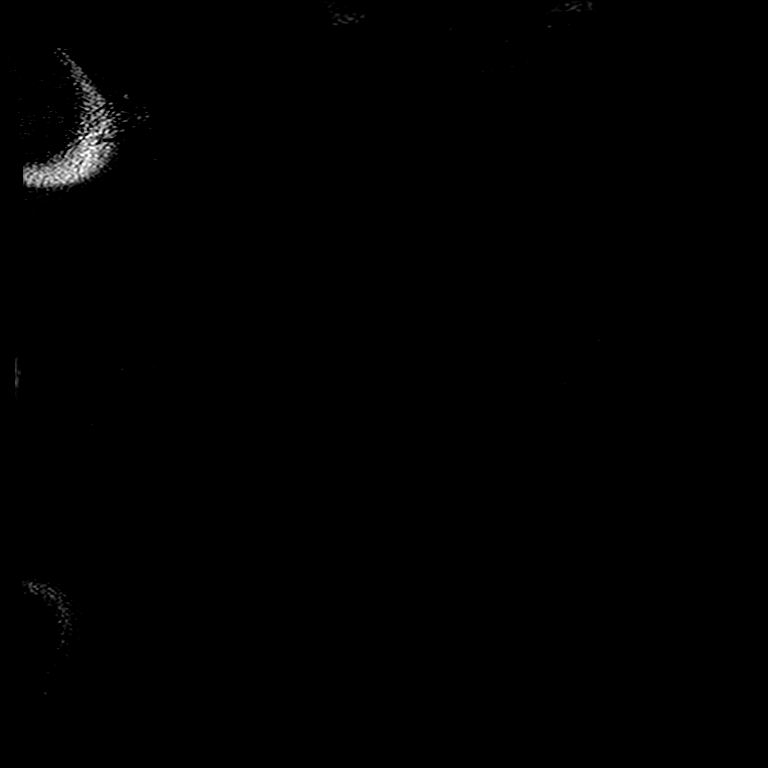

[Series 13: DWI · axial · 5.5mm · 1.68mm/px · z∈[-227,+70]mm · 4 of 138 slices shown (1 of 2)]
[im 1/138]
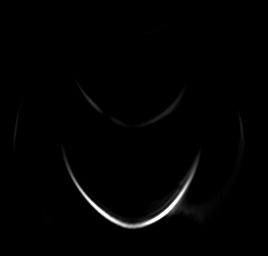
[im 46/138]
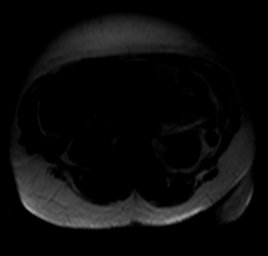
[im 92/138]
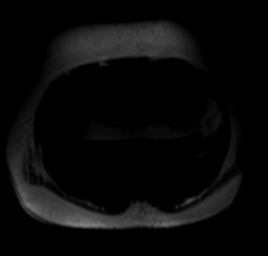
[im 138/138]
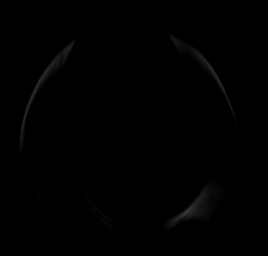

[Series 14: DWI · axial · 5.5mm · 1.68mm/px · z∈[-227,+70]mm · 2 of 46 slices shown (2 of 2)]
[im 1/46]
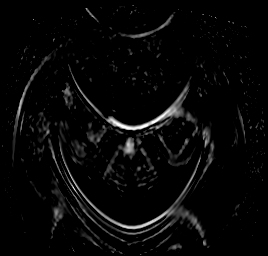
[im 46/46]
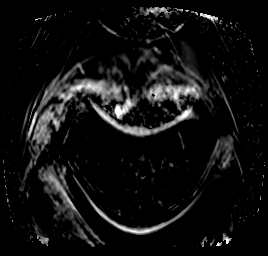

[Series 15: T2 · axial · 6.0mm · 1.41mm/px · 1 of 39 slices shown (3 of 4)]
[im 1/39]
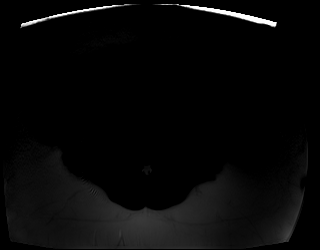

[Series 16: bSSFP · axial · 5.5mm · 1.41mm/px · z∈[-272,+11]mm · 2 of 44 slices shown]
[im 1/44]
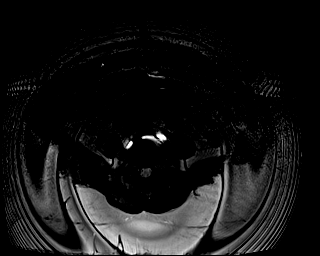
[im 44/44]
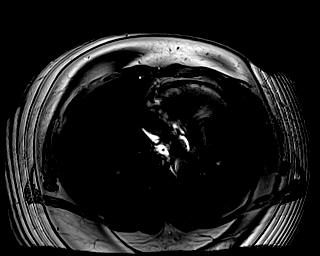

[Series 17: T2 · coronal · 3.0mm · 1.19mm/px · 1 of 19 slices shown (4 of 4)]
[im 1/19]
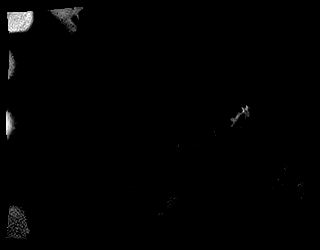

[Series 18: T1 dynamic · axial · non-contrast · 4.0mm · 1.41mm/px · z∈[-261,+23]mm · 3 of 72 slices shown]
[im 1/72]
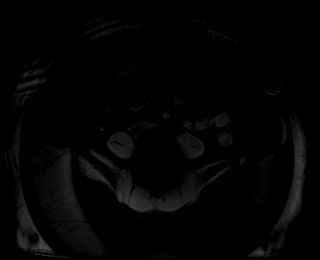
[im 36/72]
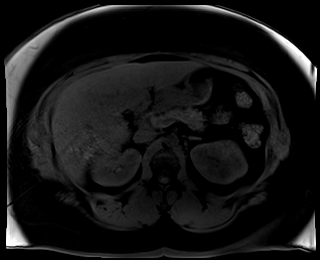
[im 72/72]
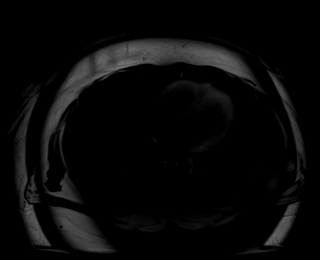

[Series 19: T1 dynamic post-contrast · axial · 4.0mm · 1.41mm/px · z∈[-261,+23]mm · 3 of 72 slices shown (1 of 9)]
[im 1/72]
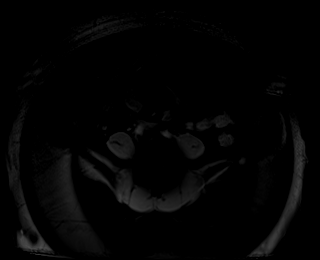
[im 36/72]
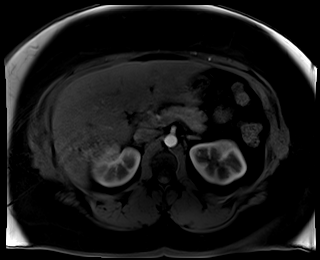
[im 72/72]
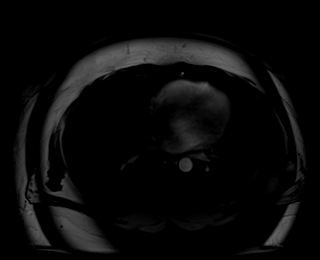

[Series 20: T1 dynamic post-contrast · axial · 4.0mm · 1.41mm/px · z∈[-261,+23]mm · 3 of 71 slices shown (2 of 9)]
[im 1/71]
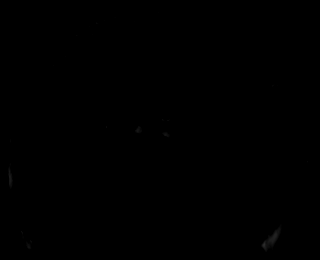
[im 36/71]
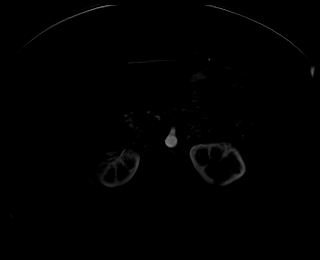
[im 71/71]
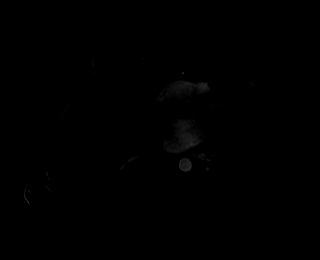

[Series 21: T1 dynamic post-contrast · axial · 4.0mm · 1.41mm/px · z∈[-261,+23]mm · 3 of 72 slices shown (3 of 9)]
[im 1/72]
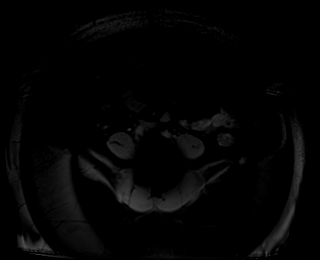
[im 36/72]
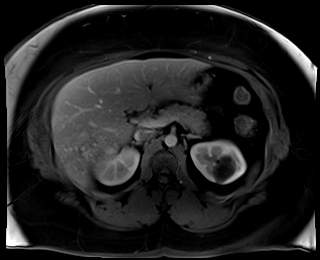
[im 72/72]
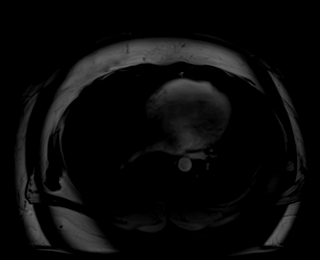

[Series 22: T1 dynamic post-contrast · axial · 4.0mm · 1.41mm/px · z∈[-261,+23]mm · 3 of 72 slices shown (4 of 9)]
[im 1/72]
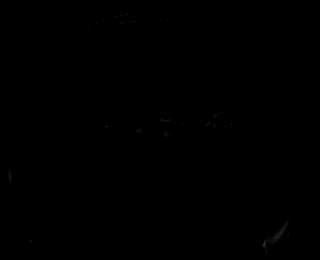
[im 36/72]
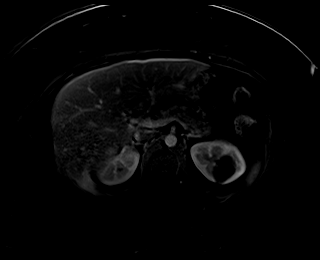
[im 72/72]
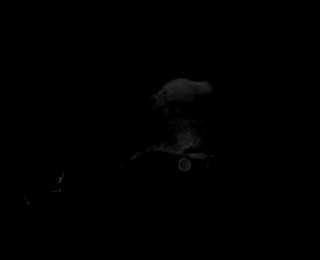

[Series 23: T1 dynamic post-contrast · axial · 4.0mm · 1.41mm/px · z∈[-261,+23]mm · 3 of 72 slices shown (5 of 9)]
[im 1/72]
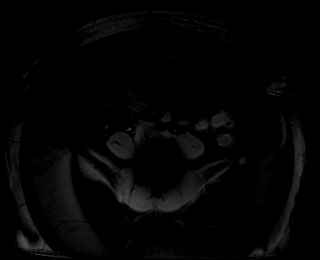
[im 36/72]
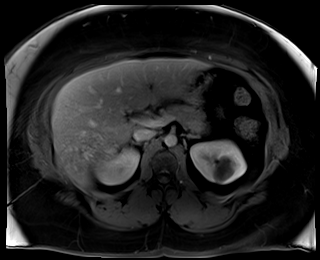
[im 72/72]
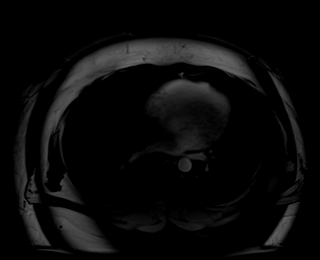

[Series 24: T1 dynamic post-contrast · axial · 4.0mm · 1.41mm/px · z∈[-261,+23]mm · 3 of 72 slices shown (6 of 9)]
[im 1/72]
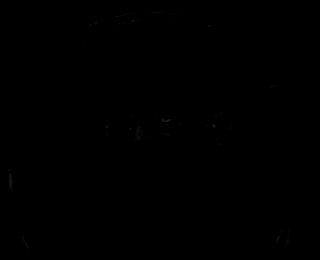
[im 36/72]
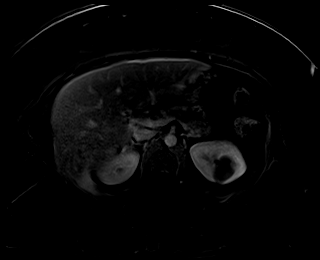
[im 72/72]
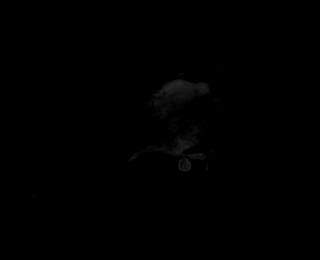

[Series 25: T1 dynamic post-contrast · coronal · 3.0mm · 1.41mm/px · 3 of 80 slices shown (7 of 9)]
[im 1/80]
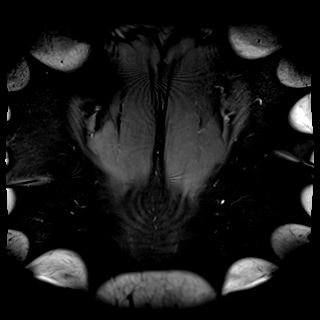
[im 40/80]
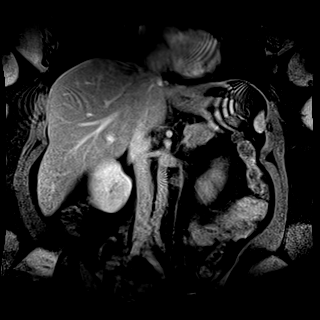
[im 80/80]
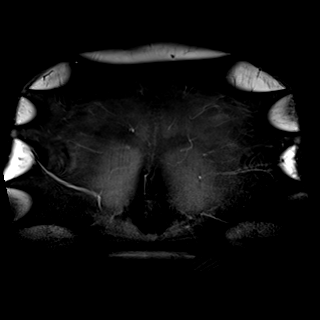

[Series 26: T1 dynamic post-contrast · axial · 4.0mm · 1.41mm/px · z∈[-261,+23]mm · 3 of 72 slices shown (8 of 9)]
[im 1/72]
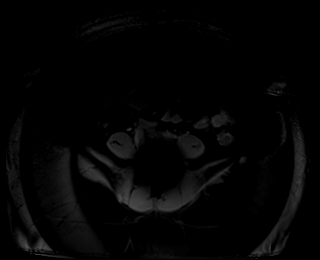
[im 36/72]
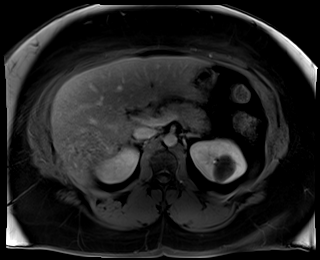
[im 72/72]
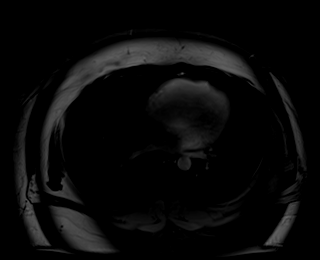

[Series 27: T1 dynamic post-contrast · axial · 4.0mm · 1.41mm/px · z∈[-261,+23]mm · 3 of 72 slices shown (9 of 9)]
[im 1/72]
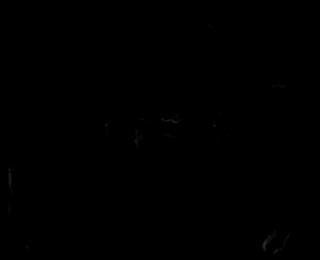
[im 36/72]
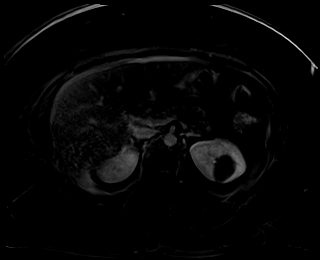
[im 72/72]
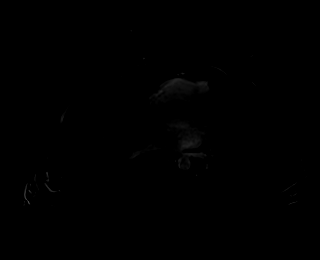

[48 of 48 positions shown; findings below may reference images not displayed]

FINDINGS: Lower chest: No acute findings.

Hepatobiliary: No hepatic masses identified. Mild diffuse hepatic
steatosis is noted. Previous cholecystectomy. No evidence of biliary
ductal dilatation or choledocholithiasis.

Pancreas: No evidence pancreatic mass or pancreatic ductal
dilatation. No evidence of peripancreatic inflammatory changes or
abnormal fluid collections. No evidence of pancreas divisum.

Spleen:  Within normal limits in size and appearance.

Adrenals/Urinary Tract: No masses identified. 4 cm benign-appearing
cyst again seen in upper pole of left kidney. No evidence of
hydronephrosis.

Stomach/Bowel: Visualized portion unremarkable.

Vascular/Lymphatic: No pathologically enlarged lymph nodes
identified. No abdominal aortic aneurysm.

Other:  None.

Musculoskeletal:  No suspicious bone lesions identified.
IMPRESSION: No radiographic evidence of pancreatitis or other acute findings.

Previous cholecystectomy. No evidence of biliary ductal dilatation
or choledocholithiasis.

Mild hepatic steatosis.

## 2023-02-06 DIAGNOSIS — Z01419 Encounter for gynecological examination (general) (routine) without abnormal findings: Secondary | ICD-10-CM | POA: Diagnosis not present

## 2023-02-06 DIAGNOSIS — Z01411 Encounter for gynecological examination (general) (routine) with abnormal findings: Secondary | ICD-10-CM | POA: Diagnosis not present

## 2023-02-06 DIAGNOSIS — N898 Other specified noninflammatory disorders of vagina: Secondary | ICD-10-CM | POA: Diagnosis not present

## 2023-02-06 DIAGNOSIS — Z113 Encounter for screening for infections with a predominantly sexual mode of transmission: Secondary | ICD-10-CM | POA: Diagnosis not present

## 2023-02-06 DIAGNOSIS — Z7251 High risk heterosexual behavior: Secondary | ICD-10-CM | POA: Diagnosis not present

## 2023-02-13 ENCOUNTER — Encounter: Payer: BC Managed Care – PPO | Admitting: Podiatry

## 2023-02-17 ENCOUNTER — Telehealth (INDEPENDENT_AMBULATORY_CARE_PROVIDER_SITE_OTHER): Payer: BC Managed Care – PPO | Admitting: Psychology

## 2023-02-17 DIAGNOSIS — F4320 Adjustment disorder, unspecified: Secondary | ICD-10-CM | POA: Diagnosis not present

## 2023-02-19 ENCOUNTER — Encounter: Payer: Self-pay | Admitting: Podiatry

## 2023-02-20 ENCOUNTER — Other Ambulatory Visit: Payer: Self-pay | Admitting: Podiatry

## 2023-02-20 DIAGNOSIS — Z79899 Other long term (current) drug therapy: Secondary | ICD-10-CM

## 2023-02-20 MED ORDER — TERBINAFINE HCL 250 MG PO TABS: 250.0000 mg | ORAL_TABLET | Freq: Every day | ORAL | 0 refills | Status: DC

## 2023-02-24 ENCOUNTER — Ambulatory Visit (INDEPENDENT_AMBULATORY_CARE_PROVIDER_SITE_OTHER): Payer: BC Managed Care – PPO | Admitting: Family Medicine

## 2023-03-02 ENCOUNTER — Ambulatory Visit (INDEPENDENT_AMBULATORY_CARE_PROVIDER_SITE_OTHER): Payer: BC Managed Care – PPO | Admitting: Podiatry

## 2023-03-02 DIAGNOSIS — Z79899 Other long term (current) drug therapy: Secondary | ICD-10-CM

## 2023-03-02 DIAGNOSIS — B351 Tinea unguium: Secondary | ICD-10-CM | POA: Diagnosis not present

## 2023-03-02 NOTE — Progress Notes (Signed)
Subjective: Chief Complaint  Patient presents with   Nail Problem    Follow up on nail removal    42 year old female presents for above concerns.  Overall she states that she is doing better.  No significant pain to the toenail no swelling redness or any drainage.  She started Lamisil not any side effects.  The peeling skin that she noticed on the big toe is also improved.  No open lesions.  No drainage.  No other concerns.  Objective: AAO x3, NAD DP/PT pulses palpable bilaterally, CRT less than 3 seconds Status post nail removal.  Peers to have healed well.  Small scabbing is still present but there is no drainage or pus or any open lesions.  The area of the peeling skin is also much improved.  There is no skin breakdown today.  No signs of infection. No pain with calf compression, swelling, warmth, erythema  Assessment: Status post hallux nail removal, onychomycosis/tinea pedis  Plan: -All treatment options discussed with the patient including all alternatives, risks, complications.  -Procedure site appears to be healing well.  Continue Lamisil she is currently not experiencing any side effects to continue to monitor.  Will recheck blood work in a couple weeks and orders already been placed.  Will likely do Lamisil for 90 days but if needed we can continue this longer. -Monitor for any clinical signs or symptoms of infection and directed to call the office immediately should any occur or go to the ER. -Patient encouraged to call the office with any questions, concerns, change in symptoms.   Vivi Barrack DPM

## 2023-03-10 DIAGNOSIS — F4320 Adjustment disorder, unspecified: Secondary | ICD-10-CM | POA: Diagnosis not present

## 2023-03-12 DIAGNOSIS — H524 Presbyopia: Secondary | ICD-10-CM | POA: Diagnosis not present

## 2023-03-12 DIAGNOSIS — H35413 Lattice degeneration of retina, bilateral: Secondary | ICD-10-CM | POA: Diagnosis not present

## 2023-03-12 DIAGNOSIS — H52222 Regular astigmatism, left eye: Secondary | ICD-10-CM | POA: Diagnosis not present

## 2023-03-12 DIAGNOSIS — H5213 Myopia, bilateral: Secondary | ICD-10-CM | POA: Diagnosis not present

## 2023-03-12 DIAGNOSIS — E119 Type 2 diabetes mellitus without complications: Secondary | ICD-10-CM | POA: Diagnosis not present

## 2023-03-12 DIAGNOSIS — H04123 Dry eye syndrome of bilateral lacrimal glands: Secondary | ICD-10-CM | POA: Diagnosis not present

## 2023-03-12 LAB — HM DIABETES EYE EXAM

## 2023-03-19 DIAGNOSIS — G4733 Obstructive sleep apnea (adult) (pediatric): Secondary | ICD-10-CM | POA: Diagnosis not present

## 2023-03-25 DIAGNOSIS — F4320 Adjustment disorder, unspecified: Secondary | ICD-10-CM | POA: Diagnosis not present

## 2023-04-10 DIAGNOSIS — F4320 Adjustment disorder, unspecified: Secondary | ICD-10-CM | POA: Diagnosis not present

## 2023-04-14 DIAGNOSIS — H04123 Dry eye syndrome of bilateral lacrimal glands: Secondary | ICD-10-CM | POA: Diagnosis not present

## 2023-04-18 DIAGNOSIS — G4733 Obstructive sleep apnea (adult) (pediatric): Secondary | ICD-10-CM | POA: Diagnosis not present

## 2023-04-21 DIAGNOSIS — F4320 Adjustment disorder, unspecified: Secondary | ICD-10-CM | POA: Diagnosis not present

## 2023-05-06 ENCOUNTER — Ambulatory Visit (HOSPITAL_COMMUNITY)
Admission: EM | Admit: 2023-05-06 | Discharge: 2023-05-06 | Disposition: A | Discharge status: Home / Self Care | Payer: BC Managed Care – PPO | Attending: Internal Medicine | Admitting: Internal Medicine

## 2023-05-06 ENCOUNTER — Ambulatory Visit (INDEPENDENT_AMBULATORY_CARE_PROVIDER_SITE_OTHER): Payer: BC Managed Care – PPO

## 2023-05-06 ENCOUNTER — Encounter (HOSPITAL_COMMUNITY): Payer: Self-pay

## 2023-05-06 DIAGNOSIS — R509 Fever, unspecified: Secondary | ICD-10-CM | POA: Diagnosis not present

## 2023-05-06 DIAGNOSIS — J189 Pneumonia, unspecified organism: Secondary | ICD-10-CM

## 2023-05-06 DIAGNOSIS — R059 Cough, unspecified: Secondary | ICD-10-CM | POA: Diagnosis not present

## 2023-05-06 MED ORDER — PROMETHAZINE-DM 6.25-15 MG/5ML PO SYRP: 5.0000 mL | ORAL_SOLUTION | Freq: Four times a day (QID) | ORAL | 0 refills | Status: DC | PRN

## 2023-05-06 MED ORDER — IBUPROFEN 800 MG PO TABS
800.0000 mg | ORAL_TABLET | Freq: Once | ORAL | Status: AC
  Administered 2023-05-06: 800 mg via ORAL

## 2023-05-06 MED ORDER — AZITHROMYCIN 250 MG PO TABS: 250.0000 mg | ORAL_TABLET | Freq: Every day | ORAL | 0 refills | Status: DC

## 2023-05-06 MED ORDER — ACETAMINOPHEN 325 MG PO TABS
ORAL_TABLET | ORAL | Status: AC
  Filled 2023-05-06: qty 2

## 2023-05-06 MED ORDER — IBUPROFEN 800 MG PO TABS
ORAL_TABLET | ORAL | Status: AC
  Filled 2023-05-06: qty 1

## 2023-05-06 MED ORDER — AMOXICILLIN 500 MG PO CAPS: 1000.0000 mg | ORAL_CAPSULE | Freq: Two times a day (BID) | ORAL | 0 refills | Status: AC

## 2023-05-06 MED ORDER — ACETAMINOPHEN 325 MG PO TABS
650.0000 mg | ORAL_TABLET | Freq: Once | ORAL | Status: AC
  Administered 2023-05-06: 650 mg via ORAL

## 2023-05-06 MED ORDER — ALBUTEROL SULFATE HFA 108 (90 BASE) MCG/ACT IN AERS: 1.0000 | INHALATION_SPRAY | Freq: Four times a day (QID) | RESPIRATORY_TRACT | 0 refills | Status: AC | PRN

## 2023-05-06 NOTE — Discharge Instructions (Addendum)
I am treating you for pneumonia with amoxicillin and azithromycin.  Please start the antibiotics as soon as possible.  You can use the cough medicine as needed, do not drink or drive on this medication as it may cause drowsiness.  For your fever, you can alternate between 800 mg of ibuprofen and 500 mg of Tylenol every 4-6 hours.  Ensure you are staying hydrated with at least 64 ounces of water daily.  Please seek immediate care if you develop a high fever despite Tylenol and ibuprofen use, shortness of breath, wheezing, or no improvement over the next 72 hours on antibiotics.

## 2023-05-06 NOTE — ED Triage Notes (Signed)
Pt c/o cough, headache, body aches, and congestion x1wk. States today having chest tightness and shaking all over. Denies taking any meds today.

## 2023-05-06 NOTE — ED Provider Notes (Signed)
[facility]    CSN: [id number] Arrival date & time: 05/06/23  1135      History   Chief Complaint Chief Complaint  Patient presents with   Cough   Fever    HPI Angel Dawson is a 42 y.o. female.   Patient presents to clinic for cough, fever, body aches, headache, nasal congestion and fatigue for the past week.  Has just felt exhausted and unable to get out of bed.  Reports she cannot take deep breaths without pain.  She does not smoke, has no history of of asthma or COPD.  She did take a COVID-19 test at home last week and it was negative.  Last week she had a low-grade fever of 99, today she has a fever of 102.9.   She has a productive cough w/ yellow sputum, shortness of breath and wheezing. Intermittent chest tightness.      The history is provided by the patient and medical records.  Cough Associated symptoms: chest pain, chills, fever, headaches, shortness of breath and wheezing   Fever Associated symptoms: chest pain, chills, cough and headaches     Past Medical History:  Diagnosis Date   Abdominal pain    related to gallstones   Achilles tendonitis, bilateral    Back pain    Bronchitis    02/17   Chest pain    Constipation    Diabetes (HCC)    Type II   DKA (diabetic ketoacidoses) 08/25/2014   Gallstones    GERD (gastroesophageal reflux disease)    Headache    migraines   Hypertension    Joint pain    Lattice degeneration, both eyes    Palpitations    Pancreatitis    PCOS (polycystic ovarian syndrome)    Sinus congestion    occ. uses OTC meds for this   Sleep apnea    Vitamin D deficiency     Patient Active Problem List   Diagnosis Date Noted   Menorrhagia 01/20/2023   Insulin resistance 12/01/2022   Other fatigue 11/13/2022   SOBOE (shortness of breath on exertion) 11/13/2022   Other chronic pain 11/13/2022   Other hyperlipidemia 11/13/2022   Depression 11/13/2022   Depression screen 11/13/2022   Type 2 diabetes mellitus  with other specified complication (HCC) 08/19/2022   Essential hypertension 08/19/2022   OSA on CPAP 08/19/2022   Tinnitus of both ears 12/05/2021   Deviated septum 01/11/2021   Nasal turbinate hypertrophy 04/08/2018   Obstructive sleep apnea treated with continuous positive airway pressure (CPAP) 12/29/2017   Onychomycosis 10/26/2014   Gastroesophageal reflux disease without esophagitis 09/28/2014   morbid obesity with starting BMI 55 08/25/2014   Chronic rhinitis 06/30/2014   Pancreatitis, acute 07/02/2013   Status post laparoscopic cholecystectomy 06/24/2013   Gallstones 04/13/2013   Low back pain 08/09/2012   Vitamin D deficiency 08/09/2012    Past Surgical History:  Procedure Laterality Date   CHOLECYSTECTOMY N/A 06/22/2013   Procedure: LAPAROSCOPIC CHOLECYSTECTOMY ;  Surgeon: Ernestene Mention, MD;  Location: [facility];  Service: General;  Laterality: N/A;   DILITATION & CURRETTAGE/HYSTROSCOPY WITH HYDROTHERMAL ABLATION N/A 01/03/2016   Procedure: DILATATION & CURETTAGE/HYSTEROSCOPY WITH HYDROTHERMAL ABLATION;  Surgeon: Jaymes Graff, MD;  Location: [facility];  Service: Gynecology;  Laterality: N/A;  HTA rep will be attend confirmed by office 12/12/15    NASAL SEPTOPLASTY W/ TURBINOPLASTY Bilateral 01/11/2021   Procedure: NASAL SEPTOPLASTY WITH TURBINATE REDUCTION;  Surgeon: Osborn Coho, MD;  Location: [facility];  Service: ENT;  Laterality: Bilateral;   TONSILLECTOMY      OB History     Gravida  0   Para  0   Term  0   Preterm  0   AB  0   Living  0      SAB  0   IAB  0   Ectopic  0   Multiple  0   Live Births  0            Home Medications    Prior to Admission medications   Medication Sig Start Date End Date Taking? Authorizing Provider  albuterol (VENTOLIN HFA) 108 (90 Base) MCG/ACT inhaler Inhale 1-2 puffs into the lungs every 6 (six) hours as needed for wheezing or shortness of breath. 05/06/23  Yes Rinaldo Ratel, [place] N, FNP  amoxicillin (AMOXIL) 500  MG capsule Take 2 capsules (1,000 mg total) by mouth 2 (two) times daily for 7 days. 05/06/23 05/13/23 Yes Rinaldo Ratel, [place] N, FNP  azithromycin (ZITHROMAX) 250 MG tablet Take 1 tablet (250 mg total) by mouth daily. Take first 2 tablets together, then 1 every day until finished. 05/06/23  Yes Rinaldo Ratel, [place] N, FNP  promethazine-dextromethorphan (PROMETHAZINE-DM) 6.25-15 MG/5ML syrup Take 5 mLs by mouth 4 (four) times daily as needed for cough. 05/06/23  Yes Rinaldo Ratel, [place] N, FNP  Blood Glucose Monitoring Suppl (BLOOD GLUCOSE METER KIT AND SUPPLIES) Dispense based on patient and insurance preference. Use up to four times daily as directed. (FOR ICD-9 250.00, 250.01). 08/26/14   Hollice Espy, MD  Cholecalciferol 1.25 MG (50000 UT) capsule     [provider]  Continuous Blood Gluc Sensor (FREESTYLE LIBRE 3 SENSOR) MISC APPLY SENSOR ON UPPER ARM EVERY 14 DAYS FOR CONTINUOUS GLUCOSE MONITORING 06/06/22   Reather Littler, MD  Continuous Blood Gluc Sensor (FREESTYLE LIBRE 3 SENSOR) MISC Use for continuous blood glucose monitoring 08/20/22   [name], Gwenlyn Found, MD  empagliflozin (JARDIANCE) 10 MG TABS tablet TAKE 1 TABLET(10 MG) BY MOUTH DAILY WITH BREAKFAST 08/20/22   [name], Gwenlyn Found, MD  fluticasone (FLONASE) 50 MCG/ACT nasal spray 2 sprays Once Daily. 04/08/18   [provider]  gabapentin (NEURONTIN) 300 MG capsule gabapentin 300 mg capsule TAKE ONE CAPSULE BY MOUTH AT BEDTIME 03/15/21   [provider]  glucose blood (ONETOUCH VERIO) test strip USE TO TEST BLOOD SUGAR ONCE DAILY 05/28/21   Reather Littler, MD  lisinopril-hydrochlorothiazide (ZESTORETIC) 20-25 MG tablet Take 1 tablet by mouth daily. 09/10/22   [name], Gwenlyn Found, MD  Multiple Vitamins-Minerals (MULTIVITAMIN WITH MINERALS) tablet Take 1 tablet by mouth daily. With Diabetic Support    [provider]  OneTouch Delica Lancets 33G MISC Use once daily to check glucose. 12/29/18   Reather Littler, MD  Probiotic  Product (PROBIOTIC-10 PO) Take 1 capsule by mouth daily. Takes Fortify Women's Digestive Probiotic    [provider]  Probiotic, Lactobacillus, CAPS Take by mouth. 04/08/18   [provider]  rosuvastatin (CRESTOR) 5 MG tablet Take 1 tablet (5 mg total) by mouth daily. Start with every other day for 1 week and then once daily 08/20/22   [name], Gwenlyn Found, MD  terbinafine (LAMISIL) 250 MG tablet Take 1 tablet (250 mg total) by mouth daily. 02/20/23   Vivi Barrack, DPM  TRULICITY 0.75 MG/0.5ML SOPN Inject 0.75 mg into the skin once a week. Patient not taking: Reported on 01/27/2023 01/12/23   [provider]    Family History Family History  Problem  Relation Age of Onset   Sleep apnea Mother    Thyroid disease Mother    Hypertension Mother    Diabetes Mother    Goiter Mother    Anxiety disorder Mother    Obesity Mother    Anxiety disorder Father    Hypertension Father    Diabetes Father    Asthma Father    Bipolar disorder Father    Depression Father    Lung cancer Paternal Grandmother        smoker-heavy   Stroke Paternal Grandfather    Diabetes Maternal Aunt    Colon cancer Neg Hx    Heart disease Neg Hx     Social History Social History   Tobacco Use   Smoking status: Never   Smokeless tobacco: Never  Vaping Use   Vaping status: Never Used  Substance Use Topics   Alcohol use: Not Currently    Alcohol/week: 0.0 standard drinks of alcohol    Comment: 1 drink per month   Drug use: No     Allergies   Patient has no known allergies.   Review of Systems Review of Systems  Constitutional:  Positive for chills, fatigue and fever.  Respiratory:  Positive for cough, shortness of breath and wheezing.   Cardiovascular:  Positive for chest pain.  Neurological:  Positive for headaches.     Physical Exam Triage Vital Signs ED Triage Vitals  Encounter Vitals Group     BP 05/06/23 1157 (!) 167/86     Systolic BP Percentile --       Diastolic BP Percentile --      Pulse Rate 05/06/23 1157 (!) 124     Resp 05/06/23 1157 18     Temp 05/06/23 1157 (!) 102.9 F (39.4 C)     Temp Source 05/06/23 1157 Oral     SpO2 05/06/23 1157 95 %     Weight --      Height --      Head Circumference --      Peak Flow --      Pain Score 05/06/23 1158 8     Pain Loc --      Pain Education --      Exclude from Growth Chart --    No data found.  Updated Vital Signs BP (!) 167/86 (BP Location: Right Arm)   Pulse (!) 124   Temp (!) 102.9 F (39.4 C) (Oral)   Resp 18   SpO2 95%   Visual Acuity Right Eye Distance:   Left Eye Distance:   Bilateral Distance:    Right Eye Near:   Left Eye Near:    Bilateral Near:     Physical Exam Vitals and nursing note reviewed.  Constitutional:      Appearance: Normal appearance.  HENT:     Head: Normocephalic and atraumatic.     Right Ear: External ear normal.     Left Ear: External ear normal.     Nose: Congestion and rhinorrhea present.     Mouth/Throat:     Mouth: Mucous membranes are moist.  Eyes:     Pupils: Pupils are equal, round, and reactive to light.  Cardiovascular:     Rate and Rhythm: Regular rhythm. Tachycardia present.     Heart sounds: Normal heart sounds. No murmur heard. Pulmonary:     Effort: Pulmonary effort is normal. No respiratory distress.     Breath sounds: Decreased air movement present. No wheezing.     Comments: Generally  diminished air movement.  Patient reports she is unable to take a deep breath. Musculoskeletal:        General: Normal range of motion.     Cervical back: Normal range of motion.  Skin:    General: Skin is warm and dry.  Neurological:     General: No focal deficit present.     Mental Status: She is alert and oriented to person, place, and time.  Psychiatric:        Mood and Affect: Mood normal.        Behavior: Behavior normal. Behavior is cooperative.      UC Treatments / Results  Labs (all labs ordered are listed, but  only abnormal results are displayed) Labs Reviewed - No data to display  EKG   Radiology DG Chest 2 View  Result Date: 05/06/2023 CLINICAL DATA:  Cough, fever. EXAM: CHEST - 2 VIEW COMPARISON:  September 13, 2022. FINDINGS: The heart size and mediastinal contours are within normal limits. Both lungs are clear. The visualized skeletal structures are unremarkable. IMPRESSION: No active cardiopulmonary disease. Electronically Signed   By: Lupita Raider M.D.   On: 05/06/2023 12:48    Procedures Procedures (including critical care time)  Medications Ordered in UC Medications  ibuprofen (ADVIL) tablet 800 mg (has no administration in time range)  acetaminophen (TYLENOL) tablet 650 mg (650 mg Oral Given 05/06/23 1205)    Initial Impression / Assessment and Plan / UC Course  I have reviewed the triage vital signs and the nursing notes.  Pertinent labs & imaging results that were available during my care of the patient were reviewed by me and considered in my medical decision making (see chart for details).  Vitals and triage reviewed, patient is hemodynamically stable.  Has a fever with associated tachycardia.  Endorses wheezing and shortness of breath, lungs with generally diminished air movement, no wheezing or rhonchi.  Imaging negative for acute infiltrate.  Will cover for pneumonia due to symptoms and duration with amoxicillin and Augmentin.  Fever improved slightly to 102.5 after Tylenol, will give ibuprofen as well.  Symptomatic management and emergency precautions given, no questions at this time.     Final Clinical Impressions(s) / UC Diagnoses   Final diagnoses:  Community acquired pneumonia, unspecified laterality  Fever, unspecified fever cause     Discharge Instructions      I am treating you for pneumonia with amoxicillin and azithromycin.  Please start the antibiotics as soon as possible.  You can use the cough medicine as needed, do not drink or drive on this medication  as it may cause drowsiness.  For your fever, you can alternate between 800 mg of ibuprofen and 500 mg of Tylenol every 4-6 hours.  Ensure you are staying hydrated with at least 64 ounces of water daily.  Please seek immediate care if you develop a high fever despite Tylenol and ibuprofen use, shortness of breath, wheezing, or no improvement over the next 72 hours on antibiotics.       ED Prescriptions     Medication Sig Dispense Auth. Provider   amoxicillin (AMOXIL) 500 MG capsule Take 2 capsules (1,000 mg total) by mouth 2 (two) times daily for 7 days. 28 capsule Rinaldo Ratel, [place], [place]   azithromycin (ZITHROMAX) 250 MG tablet Take 1 tablet (250 mg total) by mouth daily. Take first 2 tablets together, then 1 every day until finished. 6 tablet Franciso Dierks, [place] N, FNP   promethazine-dextromethorphan (PROMETHAZINE-DM) 6.25-15 MG/5ML syrup Take  5 mLs by mouth 4 (four) times daily as needed for cough. 118 mL Rinaldo Ratel, [place] N, FNP   albuterol (VENTOLIN HFA) 108 (90 Base) MCG/ACT inhaler Inhale 1-2 puffs into the lungs every 6 (six) hours as needed for wheezing or shortness of breath. 18 g Anniya Whiters, [place] N, [place]      PDMP not reviewed this encounter.   Henriette Hesser, [place] N, [place] 05/06/23 1314

## 2023-05-19 DIAGNOSIS — F4320 Adjustment disorder, unspecified: Secondary | ICD-10-CM | POA: Diagnosis not present

## 2023-05-19 DIAGNOSIS — G4733 Obstructive sleep apnea (adult) (pediatric): Secondary | ICD-10-CM | POA: Diagnosis not present

## 2023-06-16 DIAGNOSIS — F4320 Adjustment disorder, unspecified: Secondary | ICD-10-CM | POA: Diagnosis not present

## 2023-07-01 ENCOUNTER — Telehealth: Payer: Self-pay | Admitting: Family Medicine

## 2023-07-01 ENCOUNTER — Encounter: Payer: Self-pay | Admitting: Family Medicine

## 2023-07-01 DIAGNOSIS — R052 Subacute cough: Secondary | ICD-10-CM

## 2023-07-01 NOTE — Telephone Encounter (Signed)
Called patient but did not reach, replied to her MyChart

## 2023-07-01 NOTE — Telephone Encounter (Signed)
Pt scheduled appt for 9/23 with [name], pt stated she was diagnosed w/ pnuemonia last mo & still having respiratory symptoms, has not been taking diabetic medication therefore is having elevated blood sugar. Pt reported between 160-280. Please advise if she needs to be seen sooner or should she do something different regarding blood sugar.

## 2023-07-01 NOTE — Telephone Encounter (Signed)
Pt will send message

## 2023-07-02 NOTE — Addendum Note (Signed)
Addended by: Abbe Amsterdam C on: 07/02/2023 03:15 PM   Modules accepted: Orders

## 2023-07-03 ENCOUNTER — Ambulatory Visit (HOSPITAL_BASED_OUTPATIENT_CLINIC_OR_DEPARTMENT_OTHER)
Admission: RE | Admit: 2023-07-03 | Discharge: 2023-07-03 | Disposition: A | Discharge status: Home / Self Care | Payer: BC Managed Care – PPO | Source: Ambulatory Visit | Attending: Family Medicine | Admitting: Family Medicine

## 2023-07-03 ENCOUNTER — Encounter: Payer: Self-pay | Admitting: Family Medicine

## 2023-07-03 DIAGNOSIS — R052 Subacute cough: Secondary | ICD-10-CM | POA: Diagnosis not present

## 2023-07-03 DIAGNOSIS — R739 Hyperglycemia, unspecified: Secondary | ICD-10-CM | POA: Diagnosis not present

## 2023-07-05 NOTE — Patient Instructions (Incomplete)
It was good to see you today Please be sure to get your COVID booster at the pharmacy this fall  I would recommend that you go back on flonase or nasacort OTC for allergies- you might also try a non- sedating antihistamine like zyrtec, claritin, allegra  Please let me know if you don't continue to feel better  Start on Mounjaro 2.5 mg weekly- we will plan to go up on the dosage once a month as needed and tolerated- max dose is 15 mg

## 2023-07-05 NOTE — Progress Notes (Unsigned)
[facility] at [facility] [address], [address] [place], [place] [address] [phone]  Date:  07/06/2023   Name:  Angel Dawson   DOB:  08/22/1981   MRN:  [id number]  PCP:  Pearline Cables, MD    Chief Complaint: Respritory issues (1. Recurrent congestion, she wonders if her cpap is causing the issues. She wakes up with swelling in the face. 2. Fatigue- perimenopause? 3.Elevated Blood glucose, stopped Trulicity for a while d/t constipation. 4. Labs for Cortisol? /Flu shot today: will wait/Gyn- [facility] [place]/Foot exam is due)   History of Present Illness:  Angel Dawson is a 42 y.o. very pleasant female patient who presents with the following:  Patient seen today with concern of possible illness- history of DM, OSA, HTN, obesity, gallstone pancreatitis s/p cholecystectomy   Most recent visit with myself was in November of last year She has been endocrinology patient of Dr. Lucianne Muss until his retirement-I am now taking care of her diabetes  She was seen in urgent care in July and diagnosed with community-acquired pneumonia-she was treated with amoxicillin and azithromycin She contacted me last week with concern of recurrent symptoms-she was not able to be seen until today.  However she did do a chest x-ray last Friday which was negative  She also noted elevated blood sugars during her illness She is on CPAP which is managed by neurology She wonders if her CPAP machine could be making her sick-  She notes chest congestion, sneezing, stuffy nose and ST - she will check with her CPAP provider   Otherwise, she "feels like I am not well, not at my peak" "When I wake up my face is swollen" most days, gets better when she is up and about She notes nightsweats, not sleeping well, fatigue She noted consipation with trulicity- she stopped using this back in August She was taking 1.5 mg.  She notes that she was on [facility] before, she switched  over to Trulicity due to supply issues.  However, weight loss is also a concern so Mounjaro may be more ideal  Lab Results  Component Value Date   HGBA1C 6.3 (H) 11/13/2022    Flu shot- she has an appt to do flu and covid in October  Hemoglobin A1c is due Requested mammo and pap from her GYN provider   She had an ablation so she does not have menses- she wonders if she might be in menopause due to hot flashes I can check an [facility] for her   Patient Active Problem List   Diagnosis Date Noted   Menorrhagia 01/20/2023   Insulin resistance 12/01/2022   Other fatigue 11/13/2022   SOBOE (shortness of breath on exertion) 11/13/2022   Other chronic pain 11/13/2022   Other hyperlipidemia 11/13/2022   Depression 11/13/2022   Depression screen 11/13/2022   Type 2 diabetes mellitus with other specified complication (HCC) 08/19/2022   Essential hypertension 08/19/2022   OSA on CPAP 08/19/2022   Tinnitus of both ears 12/05/2021   Deviated septum 01/11/2021   Nasal turbinate hypertrophy 04/08/2018   Obstructive sleep apnea treated with continuous positive airway pressure (CPAP) 12/29/2017   Onychomycosis 10/26/2014   Gastroesophageal reflux disease without esophagitis 09/28/2014   morbid obesity with starting BMI 55 08/25/2014   Chronic rhinitis 06/30/2014   Pancreatitis, acute 07/02/2013   Status post laparoscopic cholecystectomy 06/24/2013   Gallstones 04/13/2013   Low back pain 08/09/2012   Vitamin D deficiency 08/09/2012  Past Medical History:  Diagnosis Date   Abdominal pain    related to gallstones   Achilles tendonitis, bilateral    Back pain    Bronchitis    02/17   Chest pain    Constipation    Diabetes (HCC)    Type II   DKA (diabetic ketoacidoses) 08/25/2014   Gallstones    GERD (gastroesophageal reflux disease)    Headache    migraines   Hypertension    Joint pain    Lattice degeneration, both eyes    Palpitations    Pancreatitis    PCOS (polycystic ovarian  syndrome)    Sinus congestion    occ. uses OTC meds for this   Sleep apnea    Vitamin D deficiency     Past Surgical History:  Procedure Laterality Date   CHOLECYSTECTOMY N/A 06/22/2013   Procedure: LAPAROSCOPIC CHOLECYSTECTOMY ;  Surgeon: Ernestene Mention, MD;  Location: [facility];  Service: General;  Laterality: N/A;   DILITATION & CURRETTAGE/HYSTROSCOPY WITH HYDROTHERMAL ABLATION N/A 01/03/2016   Procedure: DILATATION & CURETTAGE/HYSTEROSCOPY WITH HYDROTHERMAL ABLATION;  Surgeon: Jaymes Graff, MD;  Location: [facility];  Service: Gynecology;  Laterality: N/A;  HTA rep will be attend confirmed by office 12/12/15    NASAL SEPTOPLASTY W/ TURBINOPLASTY Bilateral 01/11/2021   Procedure: NASAL SEPTOPLASTY WITH TURBINATE REDUCTION;  Surgeon: Osborn Coho, MD;  Location: [facility] OR;  Service: ENT;  Laterality: Bilateral;   TONSILLECTOMY      Social History   Tobacco Use   Smoking status: Never   Smokeless tobacco: Never  Vaping Use   Vaping status: Never Used  Substance Use Topics   Alcohol use: Not Currently    Alcohol/week: 0.0 standard drinks of alcohol    Comment: 1 drink per month   Drug use: No    Family History  Problem Relation Age of Onset   Sleep apnea Mother    Thyroid disease Mother    Hypertension Mother    Diabetes Mother    Goiter Mother    Anxiety disorder Mother    Obesity Mother    Anxiety disorder Father    Hypertension Father    Diabetes Father    Asthma Father    Bipolar disorder Father    Depression Father    Lung cancer Paternal Grandmother        smoker-heavy   Stroke Paternal Grandfather    Diabetes Maternal Aunt    Colon cancer Neg Hx    Heart disease Neg Hx     No Known Allergies  Medication list has been reviewed and updated.  Current Outpatient Medications on File Prior to Visit  Medication Sig Dispense Refill   albuterol (VENTOLIN HFA) 108 (90 Base) MCG/ACT inhaler Inhale 1-2 puffs into the lungs every 6 (six) hours as needed for wheezing or  shortness of breath. 18 g 0   Blood Glucose Monitoring Suppl (BLOOD GLUCOSE METER KIT AND SUPPLIES) Dispense based on patient and insurance preference. Use up to four times daily as directed. (FOR ICD-9 250.00, 250.01). 1 each 0   Cholecalciferol 1.25 MG (50000 UT) capsule      Continuous Blood Gluc Sensor (FREESTYLE LIBRE 3 SENSOR) MISC APPLY SENSOR ON UPPER ARM EVERY 14 DAYS FOR CONTINUOUS GLUCOSE MONITORING 2 each 2   Continuous Blood Gluc Sensor (FREESTYLE LIBRE 3 SENSOR) MISC Use for continuous blood glucose monitoring 6 each 3   empagliflozin (JARDIANCE) 10 MG TABS tablet TAKE 1 TABLET(10 MG) BY MOUTH DAILY WITH BREAKFAST 90  tablet 3   glucose blood (ONETOUCH VERIO) test strip USE TO TEST BLOOD SUGAR ONCE DAILY 100 strip 3   lisinopril-hydrochlorothiazide (ZESTORETIC) 20-25 MG tablet Take 1 tablet by mouth daily. 90 tablet 3   Multiple Vitamins-Minerals (MULTIVITAMIN WITH MINERALS) tablet Take 1 tablet by mouth daily. With Diabetic Support     OneTouch Delica Lancets 33G MISC Use once daily to check glucose. 100 each 1   Probiotic Product (PROBIOTIC-10 PO) Take 1 capsule by mouth daily. Takes [employer]estive Probiotic     Probiotic, Lactobacillus, CAPS Take by mouth.     rosuvastatin (CRESTOR) 5 MG tablet Take 1 tablet (5 mg total) by mouth daily. Start with every other day for 1 week and then once daily 90 tablet 3   TRULICITY 0.75 MG/0.5ML SOPN Inject 0.75 mg into the skin once a week.     No current facility-administered medications on file prior to visit.    Review of Systems:  As per HPI- otherwise negative.   Physical Examination: Vitals:   07/06/23 1351  BP: 124/78  Pulse: 92  Resp: 18  Temp: 98.1 F (36.7 C)  SpO2: 98%   Vitals:   07/06/23 1351  Weight: (!) 402 lb 3.2 oz (182.4 kg)  Height: 5\' 11"  (1.803 m)   Body mass index is 56.1 kg/m. Ideal Body Weight: Weight in (lb) to have BMI = 25: 178.9  GEN: no acute distress. Obese, tall build  HEENT:  Atraumatic, Normocephalic. Bilateral TM wnl, oropharynx normal.  PEERL,EOMI.   Thyroid is enlarged with no palpable nodules Ears and Nose: No external deformity. CV: RRR, No M/G/R. No JVD. No thrill. No extra heart sounds. PULM: CTA B, no wheezes, crackles, rhonchi. No retractions. No resp. distress. No accessory muscle use. ABD: S, NT, ND, +BS. No rebound. No HSM. EXTR: No c/c/e PSYCH: Normally interactive. Conversant.   Pulse Readings from Last 3 Encounters:  07/06/23 92  05/06/23 (!) 122  01/27/23 90    Assessment and Plan: Morbid obesity (HCC)  Type 2 diabetes mellitus with other specified complication, unspecified whether long term insulin use (HCC) - Plan: Comprehensive metabolic panel, Hemoglobin A1c, tirzepatide (MOUNJARO) 2.5 MG/0.5ML Pen  OSA on CPAP  Screening, lipid - Plan: Lipid panel  Thyroid disorder screening - Plan: TSH  Screening for deficiency anemia - Plan: CBC  Vitamin D deficiency - Plan: VITAMIN D 25 Hydroxy (Vit-D Deficiency, Fractures)  Amenorrhea - Plan: FSH  Thyroid enlargement - Plan: US THYROID  Patient seen today for follow-up.  History of obesity and diabetes.  A1c is pending.  She is not having great results with Trulicity and has been out for about a month.  As such, we will switch her over to [facility] 2.5.  Recommended that we increase once a month as needed and tolerated, she will let me know when she is ready for the next dose  We discussed her weight and long-term health.  I encouraged her to think about weight loss surgery-she has begun to contemplate this a bit but would like to exhaust all other avenues first  Her respiratory symptoms seem improved now.  Recent chest x-ray was negative.  She plans to discuss concerns with CPAP machine with her CPAP provider  Noted thyroid enlargement, will obtain a thyroid ultrasound and TSH She is status post endometrial ablation and does not get menses, wonders if she may be menopausal or  perimenopausal.  We will check an [facility] level Signed Abbe Amsterdam, MD Received labs as below,  message to patient  Results for orders placed or performed in visit on 07/06/23  CBC  Result Value Ref Range   WBC 9.6 4.0 - 10.5 K/uL   RBC 4.94 3.87 - 5.11 Mil/uL   Platelets 300.0 150.0 - 400.0 K/uL   Hemoglobin 14.5 12.0 - 15.0 g/dL   HCT 16.1 09.6 - 04.5 %   MCV 90.0 78.0 - 100.0 fl   MCHC 32.6 30.0 - 36.0 g/dL   RDW 40.9 81.1 - 91.4 %  Comprehensive metabolic panel  Result Value Ref Range   Sodium 137 135 - 145 mEq/L   Potassium 4.0 3.5 - 5.1 mEq/L   Chloride 102 96 - 112 mEq/L   CO2 26 19 - 32 mEq/L   Glucose, Bld 170 (H) 70 - 99 mg/dL   BUN 11 6 - 23 mg/dL   Creatinine, Ser 7.82 0.40 - 1.20 mg/dL   Total Bilirubin 0.4 0.2 - 1.2 mg/dL   Alkaline Phosphatase 80 39 - 117 U/L   AST 16 0 - 37 U/L   ALT 16 0 - 35 U/L   Total Protein 7.4 6.0 - 8.3 g/dL   Albumin 4.6 3.5 - 5.2 g/dL   GFR 95.62 >13.08 mL/min   Calcium 10.2 8.4 - 10.5 mg/dL  Hemoglobin M5H  Result Value Ref Range   Hgb A1c MFr Bld 6.9 (H) 4.6 - 6.5 %  Lipid panel  Result Value Ref Range   Cholesterol 165 0 - 200 mg/dL   Triglycerides 846.9 (H) 0.0 - 149.0 mg/dL   HDL 62.95 >28.41 mg/dL   VLDL 32.4 (H) 0.0 - 40.1 mg/dL   LDL Cholesterol 78 0 - 99 mg/dL   Total CHOL/HDL Ratio 4    NonHDL 119.34   TSH  Result Value Ref Range   TSH 1.24 0.35 - 5.50 uIU/mL  VITAMIN D 25 Hydroxy (Vit-D Deficiency, Fractures)  Result Value Ref Range   VITD 37.44 30.00 - 100.00 ng/mL  [facility]  Result Value Ref Range   FSH 2.7 mIU/ML

## 2023-07-06 ENCOUNTER — Ambulatory Visit: Payer: BC Managed Care – PPO | Admitting: Family Medicine

## 2023-07-06 ENCOUNTER — Encounter: Payer: Self-pay | Admitting: Family Medicine

## 2023-07-06 DIAGNOSIS — E1169 Type 2 diabetes mellitus with other specified complication: Secondary | ICD-10-CM | POA: Diagnosis not present

## 2023-07-06 DIAGNOSIS — G4733 Obstructive sleep apnea (adult) (pediatric): Secondary | ICD-10-CM

## 2023-07-06 DIAGNOSIS — N912 Amenorrhea, unspecified: Secondary | ICD-10-CM

## 2023-07-06 DIAGNOSIS — Z130 Encounter for screening for diseases of the blood and blood-forming organs and certain disorders involving the immune mechanism: Secondary | ICD-10-CM

## 2023-07-06 DIAGNOSIS — Z13220 Encounter for screening for lipoid disorders: Secondary | ICD-10-CM

## 2023-07-06 DIAGNOSIS — Z1329 Encounter for screening for other suspected endocrine disorder: Secondary | ICD-10-CM | POA: Diagnosis not present

## 2023-07-06 DIAGNOSIS — Z7985 Long-term (current) use of injectable non-insulin antidiabetic drugs: Secondary | ICD-10-CM | POA: Diagnosis not present

## 2023-07-06 DIAGNOSIS — E049 Nontoxic goiter, unspecified: Secondary | ICD-10-CM

## 2023-07-06 DIAGNOSIS — E559 Vitamin D deficiency, unspecified: Secondary | ICD-10-CM

## 2023-07-06 LAB — FOLLICLE STIMULATING HORMONE: FSH: 2.7 m[IU]/mL

## 2023-07-06 LAB — LIPID PANEL
Cholesterol: 165 mg/dL (ref 0–200)
HDL: 45.2 mg/dL (ref >39.00)
LDL Cholesterol: 78 mg/dL (ref 0–99)
NonHDL: 119.34
Total CHOL/HDL Ratio: 4
Triglycerides: 208 mg/dL — ABNORMAL HIGH (ref 0.0–149.0)
VLDL: 41.6 mg/dL — ABNORMAL HIGH (ref 0.0–40.0)

## 2023-07-06 LAB — CBC
HCT: 44.5 % (ref 36.0–46.0)
Hemoglobin: 14.5 g/dL (ref 12.0–15.0)
MCHC: 32.6 g/dL (ref 30.0–36.0)
MCV: 90 fl (ref 78.0–100.0)
Platelets: 300 10*3/uL (ref 150.0–400.0)
RBC: 4.94 Mil/uL (ref 3.87–5.11)
RDW: 13.5 % (ref 11.5–15.5)
WBC: 9.6 10*3/uL (ref 4.0–10.5)

## 2023-07-06 LAB — COMPREHENSIVE METABOLIC PANEL
ALT: 16 U/L (ref 0–35)
AST: 16 U/L (ref 0–37)
Albumin: 4.6 g/dL (ref 3.5–5.2)
Alkaline Phosphatase: 80 U/L (ref 39–117)
BUN: 11 mg/dL (ref 6–23)
CO2: 26 mEq/L (ref 19–32)
Calcium: 10.2 mg/dL (ref 8.4–10.5)
Chloride: 102 mEq/L (ref 96–112)
Creatinine, Ser: 0.91 mg/dL (ref 0.40–1.20)
GFR: 76.97 mL/min (ref >60.00)
Glucose, Bld: 170 mg/dL — ABNORMAL HIGH (ref 70–99)
Potassium: 4 mEq/L (ref 3.5–5.1)
Sodium: 137 mEq/L (ref 135–145)
Total Bilirubin: 0.4 mg/dL (ref 0.2–1.2)
Total Protein: 7.4 g/dL (ref 6.0–8.3)

## 2023-07-06 LAB — TSH: TSH: 1.24 u[IU]/mL (ref 0.35–5.50)

## 2023-07-06 LAB — VITAMIN D 25 HYDROXY (VIT D DEFICIENCY, FRACTURES): VITD: 37.44 ng/mL (ref 30.00–100.00)

## 2023-07-06 LAB — HEMOGLOBIN A1C: Hgb A1c MFr Bld: 6.9 % — ABNORMAL HIGH (ref 4.6–6.5)

## 2023-07-06 MED ORDER — TIRZEPATIDE 2.5 MG/0.5ML ~~LOC~~ SOAJ: 2.5000 mg | SUBCUTANEOUS | 0 refills | Status: DC

## 2023-07-07 ENCOUNTER — Ambulatory Visit (HOSPITAL_BASED_OUTPATIENT_CLINIC_OR_DEPARTMENT_OTHER)
Admission: RE | Admit: 2023-07-07 | Discharge: 2023-07-07 | Disposition: A | Discharge status: Home / Self Care | Payer: BC Managed Care – PPO | Source: Ambulatory Visit | Attending: Family Medicine | Admitting: Family Medicine

## 2023-07-07 DIAGNOSIS — E042 Nontoxic multinodular goiter: Secondary | ICD-10-CM | POA: Diagnosis not present

## 2023-07-07 DIAGNOSIS — E049 Nontoxic goiter, unspecified: Secondary | ICD-10-CM | POA: Diagnosis not present

## 2023-07-11 ENCOUNTER — Other Ambulatory Visit: Payer: Self-pay | Admitting: Family Medicine

## 2023-07-11 ENCOUNTER — Encounter: Payer: Self-pay | Admitting: Family Medicine

## 2023-07-11 DIAGNOSIS — E041 Nontoxic single thyroid nodule: Secondary | ICD-10-CM

## 2023-07-12 NOTE — Addendum Note (Signed)
Addended by: Abbe Amsterdam C on: 07/12/2023 08:54 PM   Modules accepted: Orders

## 2023-07-24 ENCOUNTER — Encounter: Payer: Self-pay | Admitting: Family Medicine

## 2023-07-24 DIAGNOSIS — E1165 Type 2 diabetes mellitus with hyperglycemia: Secondary | ICD-10-CM

## 2023-07-24 DIAGNOSIS — E1169 Type 2 diabetes mellitus with other specified complication: Secondary | ICD-10-CM

## 2023-07-24 MED ORDER — ONETOUCH DELICA LANCETS 33G MISC
1 refills | Status: AC
Start: 2023-07-24 — End: ?

## 2023-07-24 MED ORDER — ONETOUCH VERIO VI STRP: ORAL_STRIP | 3 refills | Status: AC

## 2023-07-24 MED ORDER — TIRZEPATIDE 5 MG/0.5ML ~~LOC~~ SOAJ
5.0000 mg | SUBCUTANEOUS | 0 refills | Status: DC
Start: 2023-07-24 — End: 2023-08-24

## 2023-07-29 ENCOUNTER — Encounter (INDEPENDENT_AMBULATORY_CARE_PROVIDER_SITE_OTHER): Payer: Self-pay

## 2023-07-29 ENCOUNTER — Ambulatory Visit (INDEPENDENT_AMBULATORY_CARE_PROVIDER_SITE_OTHER): Payer: BC Managed Care – PPO | Admitting: Otolaryngology

## 2023-07-29 VITALS — Ht 71.0 in | Wt 399.0 lb

## 2023-07-29 DIAGNOSIS — R1310 Dysphagia, unspecified: Secondary | ICD-10-CM

## 2023-07-29 DIAGNOSIS — R09A2 Foreign body sensation, throat: Secondary | ICD-10-CM

## 2023-07-29 DIAGNOSIS — E041 Nontoxic single thyroid nodule: Secondary | ICD-10-CM | POA: Diagnosis not present

## 2023-07-29 NOTE — Progress Notes (Signed)
Dear Dr. Patsy Lager, Here is my assessment for our mutual patient, Angel Dawson. Thank you for allowing me the opportunity to care for your patient. Please do not hesitate to contact me should you have any other questions. Sincerely, Dr. Jovita Kussmaul  Otolaryngology Clinic Note Referring provider: Dr. Patsy Lager HPI:  Angel Dawson is a 42 y.o. female kindly referred by Dr. Patsy Lager for evaluation of thyroid nodule and concern for dysphagia/globus sensation.  Reports things started around 7-8 months ago, had fatigue, body temp changes (cold feet and hands), some brain fog, throat tickling. Things have gotten better. Went to PCP with subsequent workup, and noted to have isolated thyroid nodule with normal labs. Able to swallow everything, just  maybe very minimal pill dysphagia - just can't swallow all pills at once (only swallows 3 at a time instead of 5-6). No choking, aspiration, requiring heimlich, unintentional weight loss. No compressive symptoms. No voice change.  F/u with neuro and OSA with CPAP   Mom had goiter and needed thyroidectomy. No history of radiation to neck.  PSHx: septoplasty and Tonsillectomy  PMHx: OSA, DM,  PMH/Meds/All/SocHx/FamHx/ROS:   Past Medical History:  Diagnosis Date   Abdominal pain    related to gallstones   Achilles tendonitis, bilateral    Back pain    Bronchitis    02/17   Chest pain    Constipation    Diabetes (HCC)    Type II   DKA (diabetic ketoacidoses) 08/25/2014   Gallstones    GERD (gastroesophageal reflux disease)    Headache    migraines   Hypertension    Joint pain    Lattice degeneration, both eyes    Palpitations    Pancreatitis    PCOS (polycystic ovarian syndrome)    Sinus congestion    occ. uses OTC meds for this   Sleep apnea    Vitamin D deficiency     Past Surgical History:  Procedure Laterality Date   CHOLECYSTECTOMY N/A 06/22/2013   Procedure: LAPAROSCOPIC CHOLECYSTECTOMY ;  Surgeon: Ernestene Mention, MD;   Location: [facility];  Service: General;  Laterality: N/A;   DILITATION & CURRETTAGE/HYSTROSCOPY WITH HYDROTHERMAL ABLATION N/A 01/03/2016   Procedure: DILATATION & CURETTAGE/HYSTEROSCOPY WITH HYDROTHERMAL ABLATION;  Surgeon: Jaymes Graff, MD;  Location: [facility];  Service: Gynecology;  Laterality: N/A;  HTA rep will be attend confirmed by office 12/12/15    NASAL SEPTOPLASTY W/ TURBINOPLASTY Bilateral 01/11/2021   Procedure: NASAL SEPTOPLASTY WITH TURBINATE REDUCTION;  Surgeon: Osborn Coho, MD;  Location: [facility] OR;  Service: ENT;  Laterality: Bilateral;   TONSILLECTOMY      Family History  Problem Relation Age of Onset   Sleep apnea Mother    Thyroid disease Mother    Hypertension Mother    Diabetes Mother    Goiter Mother    Anxiety disorder Mother    Obesity Mother    Anxiety disorder Father    Hypertension Father    Diabetes Father    Asthma Father    Bipolar disorder Father    Depression Father    Lung cancer Paternal Grandmother        smoker-heavy   Stroke Paternal Grandfather    Diabetes Maternal Aunt    Colon cancer Neg Hx    Heart disease Neg Hx    No family history of bleeding disorders or difficulty with anesthesia  Social Connections: Moderately Integrated (07/06/2023)   Social Connection and Isolation Panel [NHANES]    Frequency of Communication with Friends and Family:  More than three times a week    Frequency of Social Gatherings with Friends and Family: Three times a week    Attends Religious Services: More than 4 times per year    Active Member of Clubs or Organizations: Yes    Attends Engineer, structural: More than 4 times per year    Marital Status: Never married      Current Outpatient Medications:    albuterol (VENTOLIN HFA) 108 (90 Base) MCG/ACT inhaler, Inhale 1-2 puffs into the lungs every 6 (six) hours as needed for wheezing or shortness of breath., Disp: 18 g, Rfl: 0   Blood Glucose Monitoring Suppl (BLOOD GLUCOSE METER KIT AND SUPPLIES),  Dispense based on patient and insurance preference. Use up to four times daily as directed. (FOR ICD-9 250.00, 250.01)., Disp: 1 each, Rfl: 0   Cholecalciferol 1.25 MG (50000 UT) capsule, , Disp: , Rfl:    Continuous Blood Gluc Sensor (FREESTYLE LIBRE 3 SENSOR) MISC, APPLY SENSOR ON UPPER ARM EVERY 14 DAYS FOR CONTINUOUS GLUCOSE MONITORING, Disp: 2 each, Rfl: 2   Continuous Blood Gluc Sensor (FREESTYLE LIBRE 3 SENSOR) MISC, Use for continuous blood glucose monitoring, Disp: 6 each, Rfl: 3   empagliflozin (JARDIANCE) 10 MG TABS tablet, TAKE 1 TABLET(10 MG) BY MOUTH DAILY WITH BREAKFAST, Disp: 90 tablet, Rfl: 3   glucose blood (ONETOUCH VERIO) test strip, USE TO TEST BLOOD SUGAR ONCE DAILY, Disp: 100 strip, Rfl: 3   lisinopril-hydrochlorothiazide (ZESTORETIC) 20-25 MG tablet, Take 1 tablet by mouth daily., Disp: 90 tablet, Rfl: 3   Multiple Vitamins-Minerals (MULTIVITAMIN WITH MINERALS) tablet, Take 1 tablet by mouth daily. With Diabetic Support, Disp: , Rfl:    OneTouch Delica Lancets 33G MISC, Use once daily to check glucose., Disp: 100 each, Rfl: 1   Probiotic Product (PROBIOTIC-10 PO), Take 1 capsule by mouth daily. Takes [employer] Digestive Probiotic, Disp: , Rfl:    Probiotic, Lactobacillus, CAPS, Take by mouth., Disp: , Rfl:    rosuvastatin (CRESTOR) 5 MG tablet, Take 1 tablet (5 mg total) by mouth daily. Start with every other day for 1 week and then once daily, Disp: 90 tablet, Rfl: 3   tirzepatide (MOUNJARO) 5 MG/0.5ML Pen, Inject 5 mg into the skin once a week., Disp: 6 mL, Rfl: 0    Physical Exam:   Ht 5\' 11"  (1.803 m)   Wt (!) 399 lb (181 kg)   BMI 55.65 kg/m   Salient findings:  CN II-XII intact Bilateral EAC clear and TM intact with well pneumatized middle ear spaces Weber 512: midline Rinne 512: AC > BC b/l  Rine 1024: AC > BC b/l  Anterior rhinoscopy: Septum relatively midline; mild crusting right anterior septum; bilateral inferior turbinates without significant  hypertrophy No lesions of oral cavity/oropharynx; dentition fair No obviously palpable neck masses/lymphadenopathy/thyromegaly No respiratory distress or stridor; voice quality class 1.5  Independent Review of Additional Tests or Records:  Thy [place] 07/07/2023 reviewed, agreed with read: multiple nodule, largest appears 1.5x1.x1cm but borderline. CXR 06/2023: no consolidation or significant pulmonary abnormality noted on my read PCP Notes reviewed and summarized 2024 TSH: wnl  Procedures:  Procedure Note Pre-procedure diagnosis:  Dysphagia, globus sensation Post-procedure diagnosis: Same Procedure: Transnasal Fiberoptic Laryngoscopy, CPT 31575 - Mod 25 Indication: see above Complications: None apparent EBL: 0 mL Date: 07/31/23   The procedure was undertaken to further evaluate the patient's complaint of dysphagia, globus sensation, with mirror exam inadequate for appropriate examination due to gag reflex and poor patient tolerance  Procedure:  Patient was identified as correct patient. Verbal consent was obtained. The nose was sprayed with oxymetazoline and 4% lidocaine. The The flexible laryngoscope was passed through the nose to view the nasal cavity, pharynx (oropharynx, hypopharynx) and larynx.  The larynx was examined at rest and during multiple phonatory tasks. Documentation was obtained and reviewed with patient. The scope was removed. The patient tolerated the procedure well.  Findings: The nasal cavity and nasopharynx did not reveal any masses or lesions, mucosa appeared to be without obvious lesions. The tongue base, pharyngeal walls, piriform sinuses, vallecula, epiglottis and postcricoid region are normal in appearance without noted masses. The visualized portion of the subglottis and proximal trachea is widely patent. The vocal folds are mobile bilaterally. There are no lesions on the free edge of the vocal folds nor elsewhere in the larynx worrisome for malignancy.       Electronically signed by: Read Drivers, MD 07/31/2023 10:51 AM   Impression & Plans:  Angel Dawson is a 42 y.o. female with multiple small thyroid nodules and globus sensation with minimal dysphagia: Thyroid nodules - given size, would not expect to contribute to her symptoms. Reasonable for follow up US - which has already been ordreed by Dr. Patsy Lager and she plans to f/u with her Globus sensation and concern for dysphagia: After discussion, it does not appear that she is really limited by any of her dysphagia. Perhaps slight pill dysphagia but she is not having any concerning symptoms. TFL negative. We discussed further workup with MBS should she wish but will defer. No evidence of LPR on endo today and she is not having any GERD symptoms. She was quite reassured by her TFL and will call and follow up should things change; discussed strategies for globus sensation management  MDM:  Level 4: 99204 Complexity/Problems addressed: mod (two stable chronic) Data complexity: mod - Morbidity: low    Thank you for allowing me the opportunity to care for your patient. Please do not hesitate to contact me should you have any other questions.  Sincerely, Jovita Kussmaul, MD Otolarynoglogist (ENT), [facility] Phone: [phone] Fax: [phone]  07/29/2023, 11:26 AM

## 2023-08-12 ENCOUNTER — Telehealth: Payer: BC Managed Care – PPO | Admitting: Adult Health

## 2023-08-24 ENCOUNTER — Encounter: Payer: Self-pay | Admitting: Family Medicine

## 2023-08-24 MED ORDER — TIRZEPATIDE 7.5 MG/0.5ML ~~LOC~~ SOAJ: 7.5000 mg | SUBCUTANEOUS | 1 refills | Status: DC

## 2023-08-27 ENCOUNTER — Other Ambulatory Visit: Payer: Self-pay | Admitting: Family Medicine

## 2023-08-27 ENCOUNTER — Ambulatory Visit: Payer: BC Managed Care – PPO | Admitting: Adult Health

## 2023-08-27 VITALS — BP 117/85 | HR 87 | Ht 71.5 in | Wt 399.0 lb

## 2023-08-27 DIAGNOSIS — G4733 Obstructive sleep apnea (adult) (pediatric): Secondary | ICD-10-CM

## 2023-08-27 DIAGNOSIS — E1169 Type 2 diabetes mellitus with other specified complication: Secondary | ICD-10-CM

## 2023-08-27 NOTE — Progress Notes (Signed)
PATIENT: Angel Dawson DOB: 1981-07-07  REASON FOR VISIT: follow up HISTORY FROM: patient PRIMARY NEUROLOGIST: Dr. Frances Furbish  Chief Complaint  Patient presents with   RM 18    Patient is here alone for yearly follow-up of sleep apnea. She states she also has concerns related. She states she has had recurring respiratory infections. She thinks it may be related to the machine. She states she wakes up choking on water from the tubing. She gets facial swelling with the current mask and would like to get a different one. She also states she was recently diagnosed with multinodular thyroid with fatigue. She thinks her machine was recalled. ESS 12.     HISTORY OF PRESENT ILLNESS: Today 08/27/23:  Angel Dawson is a 42 y.o. female with a history of OSA on CPAP. Returns today for follow-up.  She states that she has not been using the CPAP as consistently.  She states that she has had reoccurring respiratory infections.  Had pneumonia back in July.  She also states that she feels like she is getting flooded with water during the night.  She also thinks that her machine may be one of the ones that was recalled.  She would like to get a new machine.  She has recently been diagnosed with multi nodule thyroid.  They are currently just monitoring.DL is below     10/18/08: Angel Dawson is a 42 year old female with a history of obstructive sleep apnea on CPAP.  She returns today for follow-up.  She reports that there are times that she feels that the air is too strong.  She is wondering if AutoSet would be more comfortable for her.  She denies any other issues.  Her download is below    REVIEW OF SYSTEMS: Out of a complete 14 system review of symptoms, the patient complains only of the following symptoms, and all other reviewed systems are negative.  FSS 49 ESS 10  ALLERGIES: No Known Allergies  HOME MEDICATIONS: Outpatient Medications Prior to Visit  Medication Sig Dispense Refill   albuterol  (VENTOLIN HFA) 108 (90 Base) MCG/ACT inhaler Inhale 1-2 puffs into the lungs every 6 (six) hours as needed for wheezing or shortness of breath. 18 g 0   Blood Glucose Monitoring Suppl (BLOOD GLUCOSE METER KIT AND SUPPLIES) Dispense based on patient and insurance preference. Use up to four times daily as directed. (FOR ICD-9 250.00, 250.01). 1 each 0   Continuous Blood Gluc Sensor (FREESTYLE LIBRE 3 SENSOR) MISC APPLY SENSOR ON UPPER ARM EVERY 14 DAYS FOR CONTINUOUS GLUCOSE MONITORING 2 each 2   Continuous Blood Gluc Sensor (FREESTYLE LIBRE 3 SENSOR) MISC Use for continuous blood glucose monitoring 6 each 3   glucose blood (ONETOUCH VERIO) test strip USE TO TEST BLOOD SUGAR ONCE DAILY 100 strip 3   lisinopril-hydrochlorothiazide (ZESTORETIC) 10-12.5 MG tablet Take 1 tablet by mouth daily as needed (if BP elevates).     lisinopril-hydrochlorothiazide (ZESTORETIC) 20-25 MG tablet Take 1 tablet by mouth daily. 90 tablet 3   Multiple Vitamins-Minerals (MULTIVITAMIN WITH MINERALS) tablet Take 1 tablet by mouth daily. With Diabetic Support     OneTouch Delica Lancets 33G MISC Use once daily to check glucose. 100 each 1   Probiotic Product (PROBIOTIC-10 PO) Take 1 capsule by mouth daily. Takes Fortify Women's Digestive Probiotic     VITAMIN D PO Take 5,000 Units by mouth daily.     empagliflozin (JARDIANCE) 10 MG TABS tablet TAKE 1 TABLET(10 MG) BY MOUTH  DAILY WITH BREAKFAST 90 tablet 3   rosuvastatin (CRESTOR) 5 MG tablet Take 1 tablet (5 mg total) by mouth daily. Start with every other day for 1 week and then once daily 90 tablet 3   Cholecalciferol 1.25 MG (50000 UT) capsule  (Patient not taking: Reported on 08/27/2023)     tirzepatide (MOUNJARO) 7.5 MG/0.5ML Pen Inject 7.5 mg into the skin once a week. (Patient not taking: Reported on 08/27/2023) 2 mL 1   Cholecalciferol (VITAMIN D) 50 MCG (2000 UT) CAPS Take by mouth daily.     Probiotic, Lactobacillus, CAPS Take by mouth.     No facility-administered  medications prior to visit.    PAST MEDICAL HISTORY: Past Medical History:  Diagnosis Date   Abdominal pain    related to gallstones   Achilles tendonitis, bilateral    Back pain    Bronchitis    02/17   Chest pain    Constipation    Diabetes (HCC)    Type II   DKA (diabetic ketoacidoses) 08/25/2014   Gallstones    GERD (gastroesophageal reflux disease)    Headache    migraines   Hypertension    Joint pain    Lattice degeneration, both eyes    Palpitations    Pancreatitis    PCOS (polycystic ovarian syndrome)    Sinus congestion    occ. uses OTC meds for this   Sleep apnea    Vitamin D deficiency     PAST SURGICAL HISTORY: Past Surgical History:  Procedure Laterality Date   CHOLECYSTECTOMY N/A 06/22/2013   Procedure: LAPAROSCOPIC CHOLECYSTECTOMY ;  Surgeon: Ernestene Mention, MD;  Location: [facility];  Service: General;  Laterality: N/A;   DILITATION & CURRETTAGE/HYSTROSCOPY WITH HYDROTHERMAL ABLATION N/A 01/03/2016   Procedure: DILATATION & CURETTAGE/HYSTEROSCOPY WITH HYDROTHERMAL ABLATION;  Surgeon: Jaymes Graff, MD;  Location: [facility];  Service: Gynecology;  Laterality: N/A;  HTA rep will be attend confirmed by office 12/12/15    NASAL SEPTOPLASTY W/ TURBINOPLASTY Bilateral 01/11/2021   Procedure: NASAL SEPTOPLASTY WITH TURBINATE REDUCTION;  Surgeon: Osborn Coho, MD;  Location: [facility] OR;  Service: ENT;  Laterality: Bilateral;   TONSILLECTOMY      FAMILY HISTORY: Family History  Problem Relation Age of Onset   Sleep apnea Mother    Thyroid disease Mother    Hypertension Mother    Diabetes Mother    Goiter Mother    Anxiety disorder Mother    Obesity Mother    Anxiety disorder Father    Hypertension Father    Diabetes Father    Asthma Father    Bipolar disorder Father    Depression Father    Lung cancer Paternal Grandmother        smoker-heavy   Stroke Paternal Grandfather    Diabetes Maternal Aunt    Colon cancer Neg Hx    Heart disease Neg Hx      SOCIAL HISTORY: Social History   Socioeconomic History   Marital status: Single    Spouse name: Not on file   Number of children: 0   Years of education: Not on file   Highest education level: Bachelor's degree (e.g., BA, AB, BS)  Occupational History   Occupation: Soil scientist: [employer]  Tobacco Use   Smoking status: Never   Smokeless tobacco: Never  Vaping Use   Vaping status: Never Used  Substance and Sexual Activity   Alcohol use: Not Currently    Alcohol/week: 0.0 standard drinks of  alcohol    Comment: 1 drink per month   Drug use: No   Sexual activity: Never    Birth control/protection: None  Other Topics Concern   Not on file  Social History Narrative   Caffeine: bang energy drinks 300 mg/can, 1 can per day, maybe a 12 oz iced coffee   Social Determinants of Health   Financial Resource Strain: Low Risk  (07/06/2023)   Overall Financial Resource Strain (CARDIA)    Difficulty of Paying Living Expenses: Not hard at all  Food Insecurity: Food Insecurity Present (07/06/2023)   Hunger Vital Sign    Worried About Running Out of Food in the Last Year: Sometimes true    Ran Out of Food in the Last Year: Never true  Transportation Needs: No Transportation Needs (07/06/2023)   PRAPARE - Administrator, Civil Service (Medical): No    Lack of Transportation (Non-Medical): No  Physical Activity: Insufficiently Active (07/06/2023)   Exercise Vital Sign    Days of Exercise per Week: 1 day    Minutes of Exercise per Session: 10 min  Stress: No Stress Concern Present (07/06/2023)   [employer] of Occupational Health - Occupational Stress Questionnaire    Feeling of Stress : Only a little  Social Connections: Moderately Integrated (07/06/2023)   Social Connection and Isolation Panel [NHANES]    Frequency of Communication with Friends and Family: More than three times a week    Frequency of Social Gatherings with Friends and Family:  Three times a week    Attends Religious Services: More than 4 times per year    Active Member of Clubs or Organizations: Yes    Attends Banker Meetings: More than 4 times per year    Marital Status: Never married  Intimate Partner Violence: Not on file      PHYSICAL EXAM  Vitals:   08/27/23 0910  BP: 117/85  Pulse: 87  Weight: (!) 399 lb (181 kg)  Height: 5' 11.5" (1.816 m)   Body mass index is 54.87 kg/m.  Generalized: Well developed, in no acute distress  Chest: Lungs clear to auscultation bilaterally  Neurological examination  Mentation: Alert oriented to time, place, history taking. Follows all commands speech and language fluent Cranial nerve II-XII: facial symmetry noted.    DIAGNOSTIC DATA (LABS, IMAGING, TESTING) - I reviewed patient records, labs, notes, testing and imaging myself where available.  Lab Results  Component Value Date   WBC 9.6 07/06/2023   HGB 14.5 07/06/2023   HCT 44.5 07/06/2023   MCV 90.0 07/06/2023   PLT 300.0 07/06/2023      Component Value Date/Time   NA 137 07/06/2023 1409   NA 137 11/13/2022 0849   K 4.0 07/06/2023 1409   CL 102 07/06/2023 1409   CO2 26 07/06/2023 1409   GLUCOSE 170 (H) 07/06/2023 1409   BUN 11 07/06/2023 1409   BUN 8 11/13/2022 0849   CREATININE 0.91 07/06/2023 1409   CREATININE 1.10 04/13/2013 1748   CALCIUM 10.2 07/06/2023 1409   PROT 7.4 07/06/2023 1409   PROT 6.8 11/13/2022 0849   ALBUMIN 4.6 07/06/2023 1409   ALBUMIN 4.3 11/13/2022 0849   AST 16 07/06/2023 1409   ALT 16 07/06/2023 1409   ALKPHOS 80 07/06/2023 1409   BILITOT 0.4 07/06/2023 1409   BILITOT 0.3 11/13/2022 0849   GFRNONAA >60 09/13/2022 0722   GFRAA >60 06/19/2020 1810   Lab Results  Component Value Date   CHOL 165 07/06/2023  HDL 45.20 07/06/2023   LDLCALC 78 07/06/2023   LDLDIRECT 87.0 05/16/2019   TRIG 208.0 (H) 07/06/2023   CHOLHDL 4 07/06/2023   Lab Results  Component Value Date   HGBA1C 6.9 (H)  07/06/2023   Lab Results  Component Value Date   VITAMINB12 614 11/13/2022   Lab Results  Component Value Date   TSH 1.24 07/06/2023      ASSESSMENT AND PLAN 42 y.o. year old female  has a past medical history of Abdominal pain, Achilles tendonitis, bilateral, Back pain, Bronchitis, Chest pain, Constipation, Diabetes (HCC), DKA (diabetic ketoacidoses) (08/25/2014), Gallstones, GERD (gastroesophageal reflux disease), Headache, Hypertension, Joint pain, Lattice degeneration, both eyes, Palpitations, Pancreatitis, PCOS (polycystic ovarian syndrome), Sinus congestion, Sleep apnea, and Vitamin D deficiency. here with:  OSA on CPAP  - CPAP compliance  suboptimal - Good treatment of AHI  - Order sent for new machine and different mask - Encourage patient to use CPAP nightly and > 4 hours each night - F/U after she gets a new machine   Butch Penny, MSN, NP-C 08/27/2023, 9:57 AM [facility] [address], [address] [place], [place] [address] [phone]

## 2023-09-16 ENCOUNTER — Other Ambulatory Visit: Payer: Self-pay | Admitting: Family Medicine

## 2023-09-16 MED ORDER — TIRZEPATIDE 7.5 MG/0.5ML ~~LOC~~ SOAJ: 7.5000 mg | SUBCUTANEOUS | 1 refills | Status: DC

## 2023-09-24 ENCOUNTER — Telehealth: Payer: Self-pay | Admitting: Neurology

## 2023-09-24 DIAGNOSIS — G4733 Obstructive sleep apnea (adult) (pediatric): Secondary | ICD-10-CM

## 2023-09-24 NOTE — Telephone Encounter (Signed)
Pt states a prescription was sent for a heated humidifier for her CPAP and wanted to confirm that we received it. Requesting call back

## 2023-09-25 ENCOUNTER — Other Ambulatory Visit: Payer: Self-pay | Admitting: *Deleted

## 2023-09-25 DIAGNOSIS — G4733 Obstructive sleep apnea (adult) (pediatric): Secondary | ICD-10-CM

## 2023-09-25 NOTE — Telephone Encounter (Signed)
New order with all supplies as needed added.  Sent community message to [employer].

## 2023-09-28 NOTE — Telephone Encounter (Signed)
New, Angel Mango, RN; Angel Dawson Received, thank you!     Previous Messages    ----- Message ----- From: Angel Begin, RN Sent: 09/25/2023   9:22 AM EST To: Angel Dawson; Angel Dawson; * Subject: new machine                                    Please see new order in EPIC for pt.  Resmed new machine auto 5-10 epr 2, mask of choice- interested in dreamwear and all supplies needed.    Angel Dawson, 42 y.o., May 17, 1981 MRN: [id number] Phone: [phone]  Thanks  Andrey Dawson

## 2023-09-29 ENCOUNTER — Other Ambulatory Visit: Payer: Self-pay | Admitting: Family Medicine

## 2023-09-29 DIAGNOSIS — I10 Essential (primary) hypertension: Secondary | ICD-10-CM

## 2023-10-06 DIAGNOSIS — G4733 Obstructive sleep apnea (adult) (pediatric): Secondary | ICD-10-CM | POA: Diagnosis not present

## 2023-11-05 ENCOUNTER — Telehealth: Payer: Self-pay | Admitting: *Deleted

## 2023-11-05 NOTE — Telephone Encounter (Signed)
Noted pt has received new machine, needed appt for initial cpap f/u .   Made appt for her VV with MM/NP 12-29-2023 at 1500 (15 min vv) pt was fine with this.

## 2023-11-06 DIAGNOSIS — G4733 Obstructive sleep apnea (adult) (pediatric): Secondary | ICD-10-CM | POA: Diagnosis not present

## 2023-11-11 ENCOUNTER — Telehealth: Payer: Self-pay

## 2023-11-11 NOTE — Telephone Encounter (Signed)
PA initiated via Covermymeds; KEY: [id number].  Drug is covered by current benefit plan. No further PA activity needed

## 2023-11-24 DIAGNOSIS — F4320 Adjustment disorder, unspecified: Secondary | ICD-10-CM | POA: Diagnosis not present

## 2023-12-07 ENCOUNTER — Encounter (INDEPENDENT_AMBULATORY_CARE_PROVIDER_SITE_OTHER): Payer: Self-pay | Admitting: Family Medicine

## 2023-12-07 DIAGNOSIS — J209 Acute bronchitis, unspecified: Secondary | ICD-10-CM

## 2023-12-07 DIAGNOSIS — G4733 Obstructive sleep apnea (adult) (pediatric): Secondary | ICD-10-CM | POA: Diagnosis not present

## 2023-12-08 DIAGNOSIS — J209 Acute bronchitis, unspecified: Secondary | ICD-10-CM | POA: Diagnosis not present

## 2023-12-08 MED ORDER — AZITHROMYCIN 250 MG PO TABS: ORAL_TABLET | ORAL | 0 refills | Status: AC

## 2023-12-08 NOTE — Telephone Encounter (Signed)

## 2023-12-08 NOTE — Addendum Note (Signed)
 Addended by: Abbe Amsterdam C on: 12/08/2023 11:38 AM   Modules accepted: Orders

## 2023-12-20 ENCOUNTER — Encounter: Payer: Self-pay | Admitting: Family Medicine

## 2023-12-20 DIAGNOSIS — E041 Nontoxic single thyroid nodule: Secondary | ICD-10-CM

## 2023-12-20 DIAGNOSIS — H9319 Tinnitus, unspecified ear: Secondary | ICD-10-CM

## 2023-12-22 ENCOUNTER — Other Ambulatory Visit: Payer: Self-pay | Admitting: Family Medicine

## 2023-12-25 ENCOUNTER — Telehealth: Payer: Self-pay | Admitting: Pharmacy Technician

## 2023-12-25 ENCOUNTER — Other Ambulatory Visit (HOSPITAL_COMMUNITY): Payer: Self-pay

## 2023-12-25 NOTE — Telephone Encounter (Signed)
 Pharmacy Patient Advocate Encounter  Received notification from [facility] [employer] that Prior Authorization for [facility] 7.5MG  has been APPROVED from 12/25/23 to 12/24/24. Ran test claim, Copay is $25.00. This test claim was processed through [facility]- copay amounts may vary at other pharmacies due to pharmacy/plan contracts, or as the patient moves through the different stages of their insurance plan.   PA #/Case ID/Reference #: [id number]  Based on the test claim that was run through pt's insurance this morning, she needed a PA. We do not submit PA's without first submitting a test claim.

## 2023-12-25 NOTE — Telephone Encounter (Signed)
 Pharmacy Patient Advocate Encounter   Received notification from CoverMyMeds that prior authorization for [facility] 7.5MG  is required/requested.   Insurance verification completed.   The patient is insured through [employer] .   Per test claim: [facility] Submitted and pending

## 2023-12-25 NOTE — Telephone Encounter (Signed)
 Conrad Hurricane, CMA     11/11/23  7:57 AM Note PA initiated via Covermymeds; KEY: [id number].   Drug is covered by current benefit plan. No further PA activity needed

## 2023-12-28 NOTE — Progress Notes (Unsigned)
 [facility] [address] [place]. [place] [address] [phone]       OFFICE FOLLOW UP NOTE  Mr. Angel Dawson. Date of Birth:  November 26, 1968 Medical Record Number:  [id number]    Primary neurologist: Dr. Frances Furbish Reason for visit: Initial CPAP follow-up  Virtual Visit via Video Note  I connected with Angel Dawson. on 12/22/23 at 11:30 AM EDT by a video enabled telemedicine application and verified that I am speaking with the correct person using two identifiers.  Location: Patient: at home Provider: in office, [facility]   I discussed the limitations of evaluation and management by telemedicine and the availability of in person appointments. The patient expressed understanding and agreed to proceed.   SUBJECTIVE:   Follow-up visit:  Prior visit: 08/17/2023 with Dr. Frances Furbish (initial consult visit)  Brief HPI:   Angel Dawson. Is a 43 y.o. female who was seen by Dr. Frances Furbish on 08/17/2023 for concern of underlying sleep apnea with complaints of snoring, excessive daytime somnolence and nonrestorative sleep. ESS 14/24. HST 08/2023 showed overall mild sleep apnea with total AHI 8.7 and O2 nadir of 69%. AutoPAP set up 09/2023.     Interval history:  Patient returns for initial CPAP follow up via MyChart video visit.  Reports overall doing well with CPAP.  Currently using nasal mask, can have leaks occasionally but will resolve after repositioning.  Notes improvement of daytime energy levels and sleep quality. ESS 4/24.  No questions or concerns today.          ROS:   14 system review of systems performed and negative with exception of those listed in HPI  PMH:  Past Medical History:  Diagnosis Date   Anxiety    Arthritis    Concussion    due to falling off roof/  mulltiple fracture   Depression    History of kidney stones    TIA (transient ischemic attack)     PSH:  Past Surgical History:  Procedure Laterality Date   EXTRACORPOREAL SHOCK  WAVE LITHOTRIPSY Right 06/24/2021   Procedure: EXTRACORPOREAL SHOCK WAVE LITHOTRIPSY (ESWL);  Surgeon: Belva Agee, MD;  Location: [facility];  Service: Urology;  Laterality: Right;   SHOULDER ARTHROSCOPY W/ ROTATOR CUFF REPAIR Left 2019   SHOULDER ARTHROSCOPY WITH ROTATOR CUFF REPAIR AND SUBACROMIAL DECOMPRESSION Right 04/09/2020   Procedure: ARTHROSCOPY SHOULDER ROTATOR CUFF REPAIR, DEBRIDEMENT, SUBACROMIAL DECOMPRESSION;  Surgeon: Jones Broom, MD;  Location: [facility];  Service: Orthopedics;  Laterality: Right;   TONSILLECTOMY      Social History:  Social History   Socioeconomic History   Marital status: Married    Spouse name: Not on file   Number of children: Not on file   Years of education: Not on file   Highest education level: Not on file  Occupational History   Not on file  Tobacco Use   Smoking status: Never   Smokeless tobacco: Never  Vaping Use   Vaping status: Never Used  Substance and Sexual Activity   Alcohol use: No   Drug use: No   Sexual activity: Yes  Other Topics Concern   Not on file  Social History Narrative   Caffiene tea (3 serving a day   Working:  [employer] daytime   Social Drivers of Corporate investment banker Strain: Not on file  Food Insecurity: No Food Insecurity (05/06/2023)   Hunger Vital Sign    Worried About Running Out of Food in  the Last Year: Never true    Ran Out of Food in the Last Year: Never true  Transportation Needs: No Transportation Needs (05/06/2023)   PRAPARE - Administrator, Civil Service (Medical): No    Lack of Transportation (Non-Medical): No  Physical Activity: Not on file  Stress: Not on file  Social Connections: Not on file  Intimate Partner Violence: Not At Risk (05/06/2023)   Humiliation, Afraid, Rape, and Kick questionnaire    Fear of Current or Ex-Partner: No    Emotionally Abused: No    Physically Abused: No    Sexually Abused: No    Family History:   Family History  Problem Relation Age of Onset   Hyperlipidemia Mother    Hypertension Mother    Diabetes Father    COPD Father    Heart disease Father    Chronic granulomatous disease Brother     Medications:   Current Outpatient Medications on File Prior to Visit  Medication Sig Dispense Refill   acetaminophen (TYLENOL) 325 MG tablet Take 2 tablets (650 mg total) by mouth every 6 (six) hours as needed for mild pain (or Fever >/= 101).     amitriptyline (ELAVIL) 25 MG tablet Take 1 tablet (25 mg total) by mouth at bedtime for sleep. 90 tablet 3   atorvastatin (LIPITOR) 80 MG tablet Take 1 tablet (80 mg) by mouth daily. 90 tablet 3   celecoxib (CELEBREX) 200 MG capsule Take 1 capsule (200 mg total) by mouth 2 (two) times daily for ARTHRITIS. 180 capsule 1   cholecalciferol (VITAMIN D3) 25 MCG (1000 UNIT) tablet Take 1,000 Units by mouth daily.     clopidogrel (PLAVIX) 75 MG tablet Take 1 tablet (75 mg) by mouth daily. 90 tablet 4   FLUoxetine (PROZAC) 20 MG capsule Take 1 capsule (20 mg total) by mouth daily. (Patient taking differently: Take 20 mg by mouth at bedtime.) 90 capsule 3   MAGNESIUM PO Take 1 tablet by mouth daily.     Multiple Vitamin (MULTIVITAMIN ADULT PO) Take 1 tablet by mouth daily.     Omega-3 Fatty Acids (FISH OIL OMEGA-3 PO) Take 1 capsule by mouth daily.     Testosterone 1.62 % GEL Place 4 pumps onto the skin daily. 225 g 2   Testosterone 1.62 % GEL Place 4 pumps onto the skin daily. 225 g 2   tiZANidine (ZANAFLEX) 4 MG tablet Take 4 mg by mouth every 8 (eight) hours as needed for muscle spasms.     traMADol (ULTRAM) 50 MG tablet Take 1 tablet (50 mg total) by mouth 2 (two) times daily as needed. 180 tablet 2   No current facility-administered medications on file prior to visit.    Allergies:   Allergies  Allergen Reactions   Adhesive [Tape] Other (See Comments)    Patient states "it ripped my skin when it came off"      OBJECTIVE:  Physical  Exam  General: well developed, well nourished, seated, in no evident distress  Neurologic Exam Mental Status: Awake and fully alert. Oriented to place and time. Recent and remote memory intact. Attention span, concentration and fund of knowledge appropriate. Mood and affect appropriate.         ASSESSMENT/PLAN: Weyman Bogdon. is a 43 y.o. year old female    OSA on CPAP : Compliance report shows satisfactory usage with optimal residual AHI.  Continue current pressure settings.  Discussed continued nightly usage with ensuring greater than 4 hours  nightly for optimal benefit and per insurance purposes.  Continue to follow with DME company for any needed supplies or CPAP related concerns     Follow up in 6 months via MyChart video visit or call earlier if needed   CC:  PCP: Lise Auer, MD    I spent 15 minutes of face-to-face and non-face-to-face time with patient via MyChart video visit.  This included previsit chart review, lab review, study review, order entry, electronic health record documentation, patient education and discussion regarding above diagnoses and treatment plan and answered all other questions to patient's satisfaction  Ihor Austin, [facility]  [facility] [address] [place], [place] [address]  Phone [phone] Fax [phone] Note: This document was prepared with digital dictation and possible smart phrase technology. Any transcriptional errors that result from this process are unintentional.  *patient was seen for Butch Penny, NP, was unable to switch prior to patient being seen

## 2023-12-29 ENCOUNTER — Telehealth (INDEPENDENT_AMBULATORY_CARE_PROVIDER_SITE_OTHER): Payer: BC Managed Care – PPO | Admitting: Adult Health

## 2023-12-29 DIAGNOSIS — G4733 Obstructive sleep apnea (adult) (pediatric): Secondary | ICD-10-CM | POA: Diagnosis not present

## 2023-12-29 NOTE — Progress Notes (Signed)
 PATIENT: Angel Dawson DOB: 1981-04-25  REASON FOR VISIT: follow up HISTORY FROM: patient PRIMARY NEUROLOGIST: Dr. Frances Furbish  Virtual Visit via Video Note  I connected with Lytle Michaels on 12/29/23 at  3:00 PM EDT by a video enabled telemedicine application located remotely at [facility] and verified that I am speaking with the correct person using two identifiers who was located at their own home.   I discussed the limitations of evaluation and management by telemedicine and the availability of in person appointments. The patient expressed understanding and agreed to proceed.   PATIENT: Angel Dawson DOB: [birth year]  REASON FOR VISIT: follow up HISTORY FROM: patient  HISTORY OF PRESENT ILLNESS: Today 12/29/23:  MICHAELIA BEILFUSS is a 43 y.o. female with a history of OSA on CPAP. Returns today for follow-up. Reports that new CPAP is working well. Reports that she has not not been sleeping as well due to tinnitus.  Is scheduled to see ENT about this.  She has lost approximately 25 pounds.  She is still trying to lose weight.  Her download is below       REVIEW OF SYSTEMS: Out of a complete 14 system review of symptoms, the patient complains only of the following symptoms, and all other reviewed systems are negative.  ALLERGIES: No Known Allergies  HOME MEDICATIONS: Outpatient Medications Prior to Visit  Medication Sig Dispense Refill   albuterol (VENTOLIN HFA) 108 (90 Base) MCG/ACT inhaler Inhale 1-2 puffs into the lungs every 6 (six) hours as needed for wheezing or shortness of breath. 18 g 0   Blood Glucose Monitoring Suppl (BLOOD GLUCOSE METER KIT AND SUPPLIES) Dispense based on patient and insurance preference. Use up to four times daily as directed. (FOR ICD-9 250.00, 250.01). 1 each 0   Cholecalciferol 1.25 MG (50000 UT) capsule  (Patient not taking: Reported on 08/27/2023)     Continuous Blood Gluc Sensor (FREESTYLE LIBRE 3 SENSOR) MISC  APPLY SENSOR ON UPPER ARM EVERY 14 DAYS FOR CONTINUOUS GLUCOSE MONITORING 2 each 2   Continuous Blood Gluc Sensor (FREESTYLE LIBRE 3 SENSOR) MISC Use for continuous blood glucose monitoring 6 each 3   empagliflozin (JARDIANCE) 10 MG TABS tablet Take 1 tablet (10 mg total) by mouth daily. 90 tablet 1   glucose blood (ONETOUCH VERIO) test strip USE TO TEST BLOOD SUGAR ONCE DAILY 100 strip 3   lisinopril-hydrochlorothiazide (ZESTORETIC) 10-12.5 MG tablet Take 1 tablet by mouth daily as needed (if BP elevates).     lisinopril-hydrochlorothiazide (ZESTORETIC) 20-25 MG tablet TAKE 1 TABLET BY MOUTH DAILY 90 tablet 3   Multiple Vitamins-Minerals (MULTIVITAMIN WITH MINERALS) tablet Take 1 tablet by mouth daily. With Diabetic Support     OneTouch Delica Lancets 33G MISC Use once daily to check glucose. 100 each 1   Probiotic Product (PROBIOTIC-10 PO) Take 1 capsule by mouth daily. Takes Fortify Women's Digestive Probiotic     rosuvastatin (CRESTOR) 5 MG tablet Take 1 tablet (5 mg total) by mouth daily. 90 tablet 1   tirzepatide (MOUNJARO) 7.5 MG/0.5ML Pen Inject 7.5 mg into the skin once a week. 2 mL 0   VITAMIN D PO Take 5,000 Units by mouth daily.     No facility-administered medications prior to visit.    PAST MEDICAL HISTORY: Past Medical History:  Diagnosis Date   Abdominal pain    related to gallstones   Achilles tendonitis, bilateral    Back pain    Bronchitis    02/17  Chest pain    Constipation    Diabetes (HCC)    Type II   DKA (diabetic ketoacidoses) 08/25/2014   Gallstones    GERD (gastroesophageal reflux disease)    Headache    migraines   Hypertension    Joint pain    Lattice degeneration, both eyes    Palpitations    Pancreatitis    PCOS (polycystic ovarian syndrome)    Sinus congestion    occ. uses OTC meds for this   Sleep apnea    Vitamin D deficiency     PAST SURGICAL HISTORY: Past Surgical History:  Procedure Laterality Date   CHOLECYSTECTOMY N/A 06/22/2013    Procedure: LAPAROSCOPIC CHOLECYSTECTOMY ;  Surgeon: Ernestene Mention, MD;  Location: [facility];  Service: General;  Laterality: N/A;   DILITATION & CURRETTAGE/HYSTROSCOPY WITH HYDROTHERMAL ABLATION N/A 01/03/2016   Procedure: DILATATION & CURETTAGE/HYSTEROSCOPY WITH HYDROTHERMAL ABLATION;  Surgeon: Jaymes Graff, MD;  Location: [facility];  Service: Gynecology;  Laterality: N/A;  HTA rep will be attend confirmed by office 12/12/15    NASAL SEPTOPLASTY W/ TURBINOPLASTY Bilateral 01/11/2021   Procedure: NASAL SEPTOPLASTY WITH TURBINATE REDUCTION;  Surgeon: Osborn Coho, MD;  Location: [facility] OR;  Service: ENT;  Laterality: Bilateral;   TONSILLECTOMY      FAMILY HISTORY: Family History  Problem Relation Age of Onset   Sleep apnea Mother    Thyroid disease Mother    Hypertension Mother    Diabetes Mother    Goiter Mother    Anxiety disorder Mother    Obesity Mother    Anxiety disorder Father    Hypertension Father    Diabetes Father    Asthma Father    Bipolar disorder Father    Depression Father    Lung cancer Paternal Grandmother        smoker-heavy   Stroke Paternal Grandfather    Diabetes Maternal Aunt    Colon cancer Neg Hx    Heart disease Neg Hx     SOCIAL HISTORY: Social History   Socioeconomic History   Marital status: Single    Spouse name: Not on file   Number of children: 0   Years of education: Not on file   Highest education level: Bachelor's degree (e.g., BA, AB, BS)  Occupational History   Occupation: Soil scientist: [employer]  Tobacco Use   Smoking status: Never   Smokeless tobacco: Never  Vaping Use   Vaping status: Never Used  Substance and Sexual Activity   Alcohol use: Not Currently    Alcohol/week: 0.0 standard drinks of alcohol    Comment: 1 drink per month   Drug use: No   Sexual activity: Never    Birth control/protection: None  Other Topics Concern   Not on file  Social History Narrative   Caffeine: bang energy  drinks 300 mg/can, 1 can per day, maybe a 12 oz iced coffee   Social Drivers of Corporate investment banker Strain: Low Risk  (07/06/2023)   Overall Financial Resource Strain (CARDIA)    Difficulty of Paying Living Expenses: Not hard at all  Food Insecurity: Food Insecurity Present (07/06/2023)   Hunger Vital Sign    Worried About Running Out of Food in the Last Year: Sometimes true    Ran Out of Food in the Last Year: Never true  Transportation Needs: No Transportation Needs (07/06/2023)   PRAPARE - Transportation    Lack of Transportation (Medical): No    Lack of  Transportation (Non-Medical): No  Physical Activity: Insufficiently Active (07/06/2023)   Exercise Vital Sign    Days of Exercise per Week: 1 day    Minutes of Exercise per Session: 10 min  Stress: No Stress Concern Present (07/06/2023)   [employer] of Occupational Health - Occupational Stress Questionnaire    Feeling of Stress : Only a little  Social Connections: Moderately Integrated (07/06/2023)   Social Connection and Isolation Panel [NHANES]    Frequency of Communication with Friends and Family: More than three times a week    Frequency of Social Gatherings with Friends and Family: Three times a week    Attends Religious Services: More than 4 times per year    Active Member of Clubs or Organizations: Yes    Attends Banker Meetings: More than 4 times per year    Marital Status: Never married  Intimate Partner Violence: Not on file      PHYSICAL EXAM Generalized: Well developed, in no acute distress   Neurological examination  Mentation: Alert oriented to time, place, history taking. Follows all commands speech and language fluent Cranial nerve II-XII:Extraocular movements were full. Facial symmetry noted. DIAGNOSTIC DATA (LABS, IMAGING, TESTING) - I reviewed patient records, labs, notes, testing and imaging myself where available.  Lab Results  Component Value Date   WBC 9.6 07/06/2023    HGB 14.5 07/06/2023   HCT 44.5 07/06/2023   MCV 90.0 07/06/2023   PLT 300.0 07/06/2023      Component Value Date/Time   NA 137 07/06/2023 1409   NA 137 11/13/2022 0849   K 4.0 07/06/2023 1409   CL 102 07/06/2023 1409   CO2 26 07/06/2023 1409   GLUCOSE 170 (H) 07/06/2023 1409   BUN 11 07/06/2023 1409   BUN 8 11/13/2022 0849   CREATININE 0.91 07/06/2023 1409   CREATININE 1.10 04/13/2013 1748   CALCIUM 10.2 07/06/2023 1409   PROT 7.4 07/06/2023 1409   PROT 6.8 11/13/2022 0849   ALBUMIN 4.6 07/06/2023 1409   ALBUMIN 4.3 11/13/2022 0849   AST 16 07/06/2023 1409   ALT 16 07/06/2023 1409   ALKPHOS 80 07/06/2023 1409   BILITOT 0.4 07/06/2023 1409   BILITOT 0.3 11/13/2022 0849   GFRNONAA >60 09/13/2022 0722   GFRAA >60 06/19/2020 1810   Lab Results  Component Value Date   CHOL 165 07/06/2023   HDL 45.20 07/06/2023   LDLCALC 78 07/06/2023   LDLDIRECT 87.0 05/16/2019   TRIG 208.0 (H) 07/06/2023   CHOLHDL 4 07/06/2023   Lab Results  Component Value Date   HGBA1C 6.9 (H) 07/06/2023   Lab Results  Component Value Date   VITAMINB12 614 11/13/2022   Lab Results  Component Value Date   TSH 1.24 07/06/2023      ASSESSMENT AND PLAN 43 y.o. year old female  has a past medical history of Abdominal pain, Achilles tendonitis, bilateral, Back pain, Bronchitis, Chest pain, Constipation, Diabetes (HCC), DKA (diabetic ketoacidoses) (08/25/2014), Gallstones, GERD (gastroesophageal reflux disease), Headache, Hypertension, Joint pain, Lattice degeneration, both eyes, Palpitations, Pancreatitis, PCOS (polycystic ovarian syndrome), Sinus congestion, Sleep apnea, and Vitamin D deficiency. here with:  OSA on CPAP  CPAP compliance excellent Residual AHI is good Encouraged patient to continue using CPAP nightly and > 4 hours each night F/U in 1 year or sooner if needed    Butch Penny, MSN, NP-C 12/29/2023, 3:18 PM [facility] [address], [address] [place],  [place] [address] [phone]

## 2024-01-04 DIAGNOSIS — G4733 Obstructive sleep apnea (adult) (pediatric): Secondary | ICD-10-CM | POA: Diagnosis not present

## 2024-01-07 DIAGNOSIS — G4733 Obstructive sleep apnea (adult) (pediatric): Secondary | ICD-10-CM | POA: Diagnosis not present

## 2024-01-11 DIAGNOSIS — Z01411 Encounter for gynecological examination (general) (routine) with abnormal findings: Secondary | ICD-10-CM | POA: Diagnosis not present

## 2024-01-11 DIAGNOSIS — Z1239 Encounter for other screening for malignant neoplasm of breast: Secondary | ICD-10-CM | POA: Diagnosis not present

## 2024-01-11 DIAGNOSIS — Z6841 Body Mass Index (BMI) 40.0 and over, adult: Secondary | ICD-10-CM | POA: Diagnosis not present

## 2024-01-11 DIAGNOSIS — Z01419 Encounter for gynecological examination (general) (routine) without abnormal findings: Secondary | ICD-10-CM | POA: Diagnosis not present

## 2024-01-11 DIAGNOSIS — Z1231 Encounter for screening mammogram for malignant neoplasm of breast: Secondary | ICD-10-CM | POA: Diagnosis not present

## 2024-01-11 LAB — HM MAMMOGRAPHY

## 2024-01-12 DIAGNOSIS — E042 Nontoxic multinodular goiter: Secondary | ICD-10-CM | POA: Diagnosis not present

## 2024-01-12 DIAGNOSIS — H9313 Tinnitus, bilateral: Secondary | ICD-10-CM | POA: Diagnosis not present

## 2024-01-12 DIAGNOSIS — H93A1 Pulsatile tinnitus, right ear: Secondary | ICD-10-CM | POA: Diagnosis not present

## 2024-01-25 ENCOUNTER — Other Ambulatory Visit: Payer: Self-pay | Admitting: Family Medicine

## 2024-02-10 DIAGNOSIS — H93A1 Pulsatile tinnitus, right ear: Secondary | ICD-10-CM | POA: Diagnosis not present

## 2024-02-15 NOTE — Patient Instructions (Addendum)
 It was great to see you again today It looks like you are due for an eye exam coming up soon  I will request your mammo and pap information from Dr Mills Alma We will stop Jardiance  and try going up to 10 mg of Mounjaro    Will be in touch with labs asap

## 2024-02-15 NOTE — Progress Notes (Signed)
 [facility] [address], [address] [place], [place] [address] [phone]  Date:  02/22/2024   Name:  Angel Dawson   DOB:  04-Jul-1981   MRN:  [id number]  PCP:  Kaylee Partridge, MD    Chief Complaint: Medication Refill   History of Present Illness:  Angel Dawson is a 43 y.o. very pleasant female patient who presents with the following:  Patient seen today to discuss medications- history of DM, OSA, HTN, obesity, gallstone pancreatitis s/p cholecystectomy   She had previously seen endocrinology for diabetes but I took over when her doctor retired Most recent visit with myself was in September  We changed her from Trulicity  to Mounjaro  in the fall-she is now up to 7.5 mg She is status post ablation is not typically get menstrual periods-however most recent [facility] did not show menopause  ENT is working up pulsatile tinnitus for her.   She is working on [employer] loss-progress is slow despite Mounjaro   She works in Ship broker at [employer]  5 Mounjaro  7.5- we can try going up to 10 mg  Jardiance - will stop for now   Urine micro can be updated Foot exam-will complete today Mammogram- per GYN at [facility], Dr Suszanne Eriksson  Can update A1c- will do today  Eye exam due this month; she will schedule this  Pap 2021, can update if she would like- per GYN  Will request mammogram and Pap reports  Lab work done in [employer], lipid, vitamin D , CBC, A1c 6.9%, TSH  Wt Readings from Last 3 Encounters:  02/22/24 (!) 397 lb (180.1 kg)  08/27/23 (!) 399 lb (181 kg)  07/29/23 (!) 399 lb (181 kg)   Lab Results  Component Value Date   HGBA1C 5.7 02/22/2024    Patient Active Problem List   Diagnosis Date Noted   Menorrhagia 01/20/2023   Insulin  resistance 12/01/2022   Other fatigue 11/13/2022   SOBOE (shortness of breath on exertion) 11/13/2022   Other chronic pain 11/13/2022   Other  hyperlipidemia 11/13/2022   Depression 11/13/2022   Type 2 diabetes mellitus with other specified complication (HCC) 08/19/2022   Essential hypertension 08/19/2022   Tinnitus of both ears 12/05/2021   Deviated septum 01/11/2021   Nasal turbinate hypertrophy 04/08/2018   Obstructive sleep apnea treated with continuous positive airway pressure (CPAP) 12/29/2017   Onychomycosis 10/26/2014   Gastroesophageal reflux disease without esophagitis 09/28/2014   morbid obesity with starting BMI 55 08/25/2014   Chronic rhinitis 06/30/2014   Pancreatitis, acute 07/02/2013   Status post laparoscopic cholecystectomy 06/24/2013   Gallstones 04/13/2013   Low back pain 08/09/2012   Vitamin D  deficiency 08/09/2012    Past Medical History:  Diagnosis Date   Abdominal pain    related to gallstones   Achilles tendonitis, bilateral    Back pain    Bronchitis    02/17   Chest pain    Constipation    Diabetes (HCC)    Type II   DKA (diabetic ketoacidoses) 08/25/2014   Gallstones    GERD (gastroesophageal reflux disease)    Headache    migraines   Hypertension    Joint pain    Lattice degeneration, both eyes    Palpitations    Pancreatitis    PCOS (polycystic ovarian syndrome)    Sinus congestion    occ. uses OTC meds for this   Sleep apnea    Vitamin D   deficiency     Past Surgical History:  Procedure Laterality Date   CHOLECYSTECTOMY N/A 06/22/2013   Procedure: LAPAROSCOPIC CHOLECYSTECTOMY ;  Surgeon: Levert Ready, MD;  Location: [facility];  Service: General;  Laterality: N/A;   DILITATION & CURRETTAGE/HYSTROSCOPY WITH HYDROTHERMAL ABLATION N/A 01/03/2016   Procedure: DILATATION & CURETTAGE/HYSTEROSCOPY WITH HYDROTHERMAL ABLATION;  Surgeon: Marylu Soda, MD;  Location: [facility];  Service: Gynecology;  Laterality: N/A;  HTA rep will be attend confirmed by office 12/12/15    NASAL SEPTOPLASTY W/ TURBINOPLASTY Bilateral 01/11/2021   Procedure: NASAL SEPTOPLASTY WITH TURBINATE REDUCTION;   Surgeon: Ammon Bales, MD;  Location[facility] OR;  Service: ENT;  Laterality: Bilateral;   TONSILLECTOMY      Social History   Tobacco Use   Smoking status: Never   Smokeless tobacco: Never  Vaping Use   Vaping status: Never Used  Substance Use Topics   Alcohol use: Not Currently    Alcohol/week: 0.0 standard drinks of alcohol    Comment: 1 drink per month   Drug use: No    Family History  Problem Relation Age of Onset   Sleep apnea Mother    Thyroid  disease Mother    Hypertension Mother    Diabetes Mother    Goiter Mother    Anxiety disorder Mother    Obesity Mother    Anxiety disorder Father    Hypertension Father    Diabetes Father    Asthma Father    Bipolar disorder Father    Depression Father    Lung cancer Paternal Grandmother        smoker-heavy   Stroke Paternal Grandfather    Diabetes Maternal Aunt    Colon cancer Neg Hx    Heart disease Neg Hx     No Known Allergies  Medication list has been reviewed and updated.  Current Outpatient Medications on File Prior to Visit  Medication Sig Dispense Refill   albuterol  (VENTOLIN  HFA) 108 (90 Base) MCG/ACT inhaler Inhale 1-2 puffs into the lungs every 6 (six) hours as needed for wheezing or shortness of breath. 18 g 0   Blood Glucose Monitoring Suppl (BLOOD GLUCOSE METER KIT AND SUPPLIES) Dispense based on patient and insurance preference. Use up to four times daily as directed. (FOR ICD-9 250.00, 250.01). 1 each 0   Cholecalciferol 1.25 MG (50000 UT) capsule  (Patient not taking: Reported on 08/27/2023)     Continuous Blood Gluc Sensor (FREESTYLE LIBRE 3 SENSOR) MISC APPLY SENSOR ON UPPER ARM EVERY 14 DAYS FOR CONTINUOUS GLUCOSE MONITORING 2 each 2   Continuous Blood Gluc Sensor (FREESTYLE LIBRE 3 SENSOR) MISC Use for continuous blood glucose monitoring 6 each 3   glucose blood (ONETOUCH VERIO) test strip USE TO TEST BLOOD SUGAR ONCE DAILY 100 strip 3   lisinopril -hydrochlorothiazide  (ZESTORETIC ) 10-12.5 MG  tablet Take 1 tablet by mouth daily as needed (if BP elevates).     Multiple Vitamins-Minerals (MULTIVITAMIN WITH MINERALS) tablet Take 1 tablet by mouth daily. With Diabetic Support     OneTouch Delica Lancets 33G MISC Use once daily to check glucose. 100 each 1   Probiotic Product (PROBIOTIC-10 PO) Take 1 capsule by mouth daily. Takes Fortify Women's Digestive Probiotic     VITAMIN D  PO Take 5,000 Units by mouth daily.     No current facility-administered medications on file prior to visit.    Review of Systems:  As per HPI- otherwise negative.   Physical Examination: Vitals:   02/22/24 1108  BP: (!) 140/86  Pulse: 88  Resp: 18  Temp: 99 F (37.2 C)  SpO2: 97%   Vitals:   02/22/24 1108  Weight: (!) 397 lb (180.1 kg)  Height: 5' 11.5" (1.816 m)   Body mass index is 54.6 kg/m. Ideal Body Weight: Weight in (lb) to have BMI = 25: 181.4  GEN: no acute distress.  Tall build, obese.  Looks well HEENT: Atraumatic, Normocephalic.  Ears and Nose: No external deformity. CV: RRR, No M/G/R. No JVD. No thrill. No extra heart sounds. PULM: CTA B, no wheezes, crackles, rhonchi. No retractions. No resp. distress. No accessory muscle use. ABD: S, NT, ND, +BS. No rebound. No HSM. EXTR: No c/c/e PSYCH: Normally interactive. Conversant.   Assessment and Plan: Controlled type 2 diabetes mellitus with other specified complication, without long-term current use of insulin  (HCC) - Plan: tirzepatide  (MOUNJARO ) 10 MG/0.5ML Pen, rosuvastatin  (CRESTOR ) 5 MG tablet, Continuous Glucose Sensor (FREESTYLE LIBRE 3 PLUS SENSOR) MISC, Hemoglobin A1c, Microalbumin / creatinine urine ratio, Lipid panel  OSA on CPAP  Morbid obesity (HCC)  Primary hypertension - Plan: lisinopril -hydrochlorothiazide  (ZESTORETIC ) 20-25 MG tablet, CBC, Comprehensive metabolic panel with GFR  Thyroid  nodule - Plan: TSH  Patient seen today for follow-up.  Reminded her to get an eye exam Follow-up on diabetes control  today.  She is using the CBG, refill this for her today.  Will increase Mounjaro  to 10 mg.  We decided to stop her Jardiance  as she notes excellent blood sugar control with just her GLP-1  She continues to use her CPAP  Working on weight loss, will go up on her Mounjaro  dose  Blood pressure typically well-controlled, refilled medications  Will plan further follow- up pending labs.   Signed Gates Kasal, MD  Received lab results as below, message to patient Results for orders placed or performed in visit on 02/22/24  Hemoglobin A1c   Collection Time: 02/22/24 11:43 AM  Result Value Ref Range   Hgb A1c MFr Bld 5.7 4.6 - 6.5 %  Microalbumin / creatinine urine ratio   Collection Time: 02/22/24 11:43 AM  Result Value Ref Range   Microalb, Ur 2.0 (H) 0.0 - 1.9 mg/dL   Creatinine,U 161.0 mg/dL   Microalb Creat Ratio 11.3 0.0 - 30.0 mg/g  CBC   Collection Time: 02/22/24 11:43 AM  Result Value Ref Range   WBC 7.4 4.0 - 10.5 K/uL   RBC 4.85 3.87 - 5.11 Mil/uL   Platelets 284.0 150.0 - 400.0 K/uL   Hemoglobin 14.2 12.0 - 15.0 g/dL   HCT 96.0 45.4 - 09.8 %   MCV 90.5 78.0 - 100.0 fl   MCHC 32.3 30.0 - 36.0 g/dL   RDW 11.9 14.7 - 82.9 %  Comprehensive metabolic panel with GFR   Collection Time: 02/22/24 11:43 AM  Result Value Ref Range   Sodium 138 135 - 145 mEq/L   Potassium 4.2 3.5 - 5.1 mEq/L   Chloride 101 96 - 112 mEq/L   CO2 29 19 - 32 mEq/L   Glucose, Bld 110 (H) 70 - 99 mg/dL   BUN 13 6 - 23 mg/dL   Creatinine, Ser 5.62 0.40 - 1.20 mg/dL   Total Bilirubin 0.4 0.2 - 1.2 mg/dL   Alkaline Phosphatase 69 39 - 117 U/L   AST 20 0 - 37 U/L   ALT 20 0 - 35 U/L   Total Protein 7.5 6.0 - 8.3 g/dL   Albumin 4.8 3.5 - 5.2 g/dL   GFR 13.08 >65.78 mL/min   Calcium   10.3 8.4 - 10.5 mg/dL  Lipid panel   Collection Time: 02/22/24 11:43 AM  Result Value Ref Range   Cholesterol 132 0 - 200 mg/dL   Triglycerides 16.1 0.0 - 149.0 mg/dL   HDL 09.60 >45.40 mg/dL   VLDL 98.1 0.0 -  19.1 mg/dL   LDL Cholesterol 68 0 - 99 mg/dL   Total CHOL/HDL Ratio 3    NonHDL 86.70   TSH   Collection Time: 02/22/24 11:43 AM  Result Value Ref Range   TSH 1.09 0.35 - 5.50 uIU/mL

## 2024-02-22 ENCOUNTER — Encounter: Payer: Self-pay | Admitting: Family Medicine

## 2024-02-22 ENCOUNTER — Ambulatory Visit: Admitting: Family Medicine

## 2024-02-22 VITALS — BP 140/86 | HR 88 | Temp 99.0°F | Resp 18 | Ht 71.5 in | Wt 397.0 lb

## 2024-02-22 DIAGNOSIS — I10 Essential (primary) hypertension: Secondary | ICD-10-CM | POA: Diagnosis not present

## 2024-02-22 DIAGNOSIS — E041 Nontoxic single thyroid nodule: Secondary | ICD-10-CM | POA: Diagnosis not present

## 2024-02-22 DIAGNOSIS — E1169 Type 2 diabetes mellitus with other specified complication: Secondary | ICD-10-CM | POA: Diagnosis not present

## 2024-02-22 DIAGNOSIS — Z7985 Long-term (current) use of injectable non-insulin antidiabetic drugs: Secondary | ICD-10-CM

## 2024-02-22 DIAGNOSIS — G4733 Obstructive sleep apnea (adult) (pediatric): Secondary | ICD-10-CM | POA: Diagnosis not present

## 2024-02-22 LAB — MICROALBUMIN / CREATININE URINE RATIO
Creatinine,U: 180.4 mg/dL
Microalb Creat Ratio: 11.3 mg/g (ref 0.0–30.0)
Microalb, Ur: 2 mg/dL — ABNORMAL HIGH (ref 0.0–1.9)

## 2024-02-22 LAB — LIPID PANEL
Cholesterol: 132 mg/dL (ref 0–200)
HDL: 45.6 mg/dL (ref >39.00)
LDL Cholesterol: 68 mg/dL (ref 0–99)
NonHDL: 86.7
Total CHOL/HDL Ratio: 3
Triglycerides: 93 mg/dL (ref 0.0–149.0)
VLDL: 18.6 mg/dL (ref 0.0–40.0)

## 2024-02-22 LAB — COMPREHENSIVE METABOLIC PANEL WITH GFR
ALT: 20 U/L (ref 0–35)
AST: 20 U/L (ref 0–37)
Albumin: 4.8 g/dL (ref 3.5–5.2)
Alkaline Phosphatase: 69 U/L (ref 39–117)
BUN: 13 mg/dL (ref 6–23)
CO2: 29 meq/L (ref 19–32)
Calcium: 10.3 mg/dL (ref 8.4–10.5)
Chloride: 101 meq/L (ref 96–112)
Creatinine, Ser: 0.97 mg/dL (ref 0.40–1.20)
GFR: 70.98 mL/min (ref >60.00)
Glucose, Bld: 110 mg/dL — ABNORMAL HIGH (ref 70–99)
Potassium: 4.2 meq/L (ref 3.5–5.1)
Sodium: 138 meq/L (ref 135–145)
Total Bilirubin: 0.4 mg/dL (ref 0.2–1.2)
Total Protein: 7.5 g/dL (ref 6.0–8.3)

## 2024-02-22 LAB — CBC
HCT: 43.9 % (ref 36.0–46.0)
Hemoglobin: 14.2 g/dL (ref 12.0–15.0)
MCHC: 32.3 g/dL (ref 30.0–36.0)
MCV: 90.5 fl (ref 78.0–100.0)
Platelets: 284 10*3/uL (ref 150.0–400.0)
RBC: 4.85 Mil/uL (ref 3.87–5.11)
RDW: 13.8 % (ref 11.5–15.5)
WBC: 7.4 10*3/uL (ref 4.0–10.5)

## 2024-02-22 LAB — TSH: TSH: 1.09 u[IU]/mL (ref 0.35–5.50)

## 2024-02-22 LAB — HEMOGLOBIN A1C: Hgb A1c MFr Bld: 5.7 % (ref 4.6–6.5)

## 2024-02-22 MED ORDER — TIRZEPATIDE 10 MG/0.5ML ~~LOC~~ SOAJ: 10.0000 mg | SUBCUTANEOUS | 3 refills | Status: DC

## 2024-02-22 MED ORDER — ROSUVASTATIN CALCIUM 5 MG PO TABS
5.0000 mg | ORAL_TABLET | Freq: Every day | ORAL | 3 refills | Status: AC
Start: 2024-02-22 — End: ?

## 2024-02-22 MED ORDER — LISINOPRIL-HYDROCHLOROTHIAZIDE 20-25 MG PO TABS
1.0000 | ORAL_TABLET | Freq: Every day | ORAL | 3 refills | Status: AC
Start: 2024-02-22 — End: ?

## 2024-02-22 MED ORDER — FREESTYLE LIBRE 3 PLUS SENSOR MISC
3 refills | Status: AC
Start: 2024-02-22 — End: ?

## 2024-03-04 ENCOUNTER — Encounter: Payer: Self-pay | Admitting: Family Medicine

## 2024-03-08 DIAGNOSIS — G4733 Obstructive sleep apnea (adult) (pediatric): Secondary | ICD-10-CM | POA: Diagnosis not present

## 2024-03-10 ENCOUNTER — Encounter: Payer: Self-pay | Admitting: Adult Health

## 2024-03-29 DIAGNOSIS — H524 Presbyopia: Secondary | ICD-10-CM | POA: Diagnosis not present

## 2024-03-29 DIAGNOSIS — E119 Type 2 diabetes mellitus without complications: Secondary | ICD-10-CM | POA: Diagnosis not present

## 2024-03-29 DIAGNOSIS — H35413 Lattice degeneration of retina, bilateral: Secondary | ICD-10-CM | POA: Diagnosis not present

## 2024-03-29 DIAGNOSIS — H5213 Myopia, bilateral: Secondary | ICD-10-CM | POA: Diagnosis not present

## 2024-03-29 DIAGNOSIS — H52222 Regular astigmatism, left eye: Secondary | ICD-10-CM | POA: Diagnosis not present

## 2024-03-29 DIAGNOSIS — H04123 Dry eye syndrome of bilateral lacrimal glands: Secondary | ICD-10-CM | POA: Diagnosis not present

## 2024-03-29 LAB — HM DIABETES EYE EXAM

## 2024-04-25 DIAGNOSIS — G4733 Obstructive sleep apnea (adult) (pediatric): Secondary | ICD-10-CM | POA: Diagnosis not present

## 2024-04-27 ENCOUNTER — Encounter (INDEPENDENT_AMBULATORY_CARE_PROVIDER_SITE_OTHER): Payer: Self-pay | Admitting: Family Medicine

## 2024-04-27 ENCOUNTER — Other Ambulatory Visit (HOSPITAL_BASED_OUTPATIENT_CLINIC_OR_DEPARTMENT_OTHER): Payer: Self-pay

## 2024-04-27 DIAGNOSIS — B370 Candidal stomatitis: Secondary | ICD-10-CM | POA: Diagnosis not present

## 2024-04-27 MED ORDER — CLOTRIMAZOLE 10 MG MT TROC
10.0000 mg | Freq: Every day | OROMUCOSAL | 0 refills | Status: AC
Start: 2024-04-27 — End: ?
  Filled 2024-04-27: qty 35, 7d supply, fill #0

## 2024-04-27 NOTE — Telephone Encounter (Signed)

## 2024-04-29 ENCOUNTER — Other Ambulatory Visit (HOSPITAL_BASED_OUTPATIENT_CLINIC_OR_DEPARTMENT_OTHER): Payer: Self-pay

## 2024-05-23 DIAGNOSIS — M545 Low back pain, unspecified: Secondary | ICD-10-CM | POA: Diagnosis not present

## 2024-05-26 DIAGNOSIS — G4733 Obstructive sleep apnea (adult) (pediatric): Secondary | ICD-10-CM | POA: Diagnosis not present

## 2024-06-02 ENCOUNTER — Encounter: Payer: Self-pay | Admitting: Gastroenterology

## 2024-06-14 ENCOUNTER — Telehealth: Payer: Self-pay

## 2024-06-14 NOTE — Telephone Encounter (Signed)
 Attempted to reach patient- office needs updated weight for accurate BMI If patient is under BMI of 50 then procedure can proceed as scheduled If BMI is greater than 50 then a message will need to be sent to the MD to ask whether the patient needs an OV or if she can be a direct at the hospital; Awaiting patient to return call to the office;

## 2024-06-16 NOTE — Telephone Encounter (Signed)
 Returned call to patient.  She states her current weight is 383lbs, Ht verified 5' 11.5 which places her BMI at 52.7.  Pt confirms she is still on Mounjaro . She was informed BMI's over 50 are considered high risk and needs to be done at the hospital endoscopy center.  Colonoscopy at [facility] cancelled, PV will remain for 9/12.  Dr. Legrand, Do you want an OV first or can she be a direct? Please advise. Thank you, Raileigh Sabater

## 2024-06-16 NOTE — Telephone Encounter (Signed)
 Inbound call from pt requesting to speak to the nurse. Patient is requesting a call back. Please advise.

## 2024-06-17 ENCOUNTER — Telehealth: Payer: Self-pay | Admitting: *Deleted

## 2024-06-17 NOTE — Telephone Encounter (Signed)
 Yesi,  This pt's BMI is greater than 50; their procedure will need to be performed at the hospital.  Thanks,  Cathlyn Parsons

## 2024-06-19 NOTE — Telephone Encounter (Signed)
 Wait list for next available screening colonoscopy in the hospital endoscopy lab with any provider.  H Danis

## 2024-06-20 NOTE — Telephone Encounter (Signed)
 Telephone call to patient and she was informed of Dr. Clayburn comment below and she verbalized understanding.  She will need to set up transportation arrangements for hospital colonoscopy.  Informed one of our schedulers will be calling her with available dates. PV cancelled and will also need to be rescheduled after hospital procedure date is confirmed.

## 2024-06-21 NOTE — Telephone Encounter (Signed)
 Does Dr Leigh have a list of screening colon at the hospital? I think this patient needs to be on it unless there is an opening.

## 2024-06-21 NOTE — Telephone Encounter (Signed)
 Yes, she is a Dr Leigh pt

## 2024-06-21 NOTE — Telephone Encounter (Signed)
 Jan is there a hospital list for Dr. Leigh

## 2024-06-21 NOTE — Telephone Encounter (Signed)
 Dr. Legrand stated she can be a direct.  No policy that she needs to be seen in the office fist.  Just need to hold the Mounjaro . Technically if assigned to [facility], she is his patient.

## 2024-06-22 ENCOUNTER — Encounter: Payer: Self-pay | Admitting: Family Medicine

## 2024-06-22 ENCOUNTER — Other Ambulatory Visit: Payer: Self-pay

## 2024-06-22 DIAGNOSIS — Z1211 Encounter for screening for malignant neoplasm of colon: Secondary | ICD-10-CM

## 2024-06-22 NOTE — Telephone Encounter (Signed)
 Pt scheduled for previsit 08/01/24 at 10am. Colon scheduled at [facility] with Dr Leigh 09/01/24@8 :30am. Rjdz#[id number].

## 2024-06-22 NOTE — Telephone Encounter (Signed)
 This pt is scheduled on 52 for PV. Sending as RICK.

## 2024-06-24 ENCOUNTER — Encounter

## 2024-06-24 NOTE — Telephone Encounter (Signed)
 PV 10/20

## 2024-06-26 DIAGNOSIS — G4733 Obstructive sleep apnea (adult) (pediatric): Secondary | ICD-10-CM | POA: Diagnosis not present

## 2024-07-07 ENCOUNTER — Ambulatory Visit (HOSPITAL_BASED_OUTPATIENT_CLINIC_OR_DEPARTMENT_OTHER)
Admission: RE | Admit: 2024-07-07 | Discharge: 2024-07-07 | Disposition: A | Discharge status: Home / Self Care | Source: Ambulatory Visit | Attending: Family Medicine | Admitting: Family Medicine

## 2024-07-07 ENCOUNTER — Encounter: Admitting: Gastroenterology

## 2024-07-07 DIAGNOSIS — E042 Nontoxic multinodular goiter: Secondary | ICD-10-CM | POA: Diagnosis not present

## 2024-07-07 DIAGNOSIS — E041 Nontoxic single thyroid nodule: Secondary | ICD-10-CM | POA: Insufficient documentation

## 2024-07-11 ENCOUNTER — Encounter: Payer: Self-pay | Admitting: Family Medicine

## 2024-07-11 DIAGNOSIS — E041 Nontoxic single thyroid nodule: Secondary | ICD-10-CM

## 2024-07-16 ENCOUNTER — Other Ambulatory Visit: Payer: Self-pay

## 2024-07-16 ENCOUNTER — Ambulatory Visit
Admission: EM | Admit: 2024-07-16 | Discharge: 2024-07-16 | Disposition: A | Discharge status: Home / Self Care | Attending: Family Medicine | Admitting: Family Medicine

## 2024-07-16 DIAGNOSIS — H6501 Acute serous otitis media, right ear: Secondary | ICD-10-CM

## 2024-07-16 MED ORDER — PREDNISONE 20 MG PO TABS: 40.0000 mg | ORAL_TABLET | Freq: Every day | ORAL | 0 refills | Status: AC

## 2024-07-16 MED ORDER — AMOXICILLIN 875 MG PO TABS: 875.0000 mg | ORAL_TABLET | Freq: Two times a day (BID) | ORAL | 0 refills | Status: AC

## 2024-07-16 NOTE — ED Triage Notes (Signed)
 Pt c/o pain and muffling in right ear started today upon waking

## 2024-07-16 NOTE — Discharge Instructions (Addendum)
 Start amoxicillin  twice daily for 10 days.  Start prednisone  daily for 5 days.  Continue Flonase  and your allergy medicine daily as well.  Please follow-up with your ear nose and throat or PCP if your symptoms do not improve.  Please go to the ER for any worsening symptoms.  Hope you feel better soon exclamation

## 2024-07-16 NOTE — ED Provider Notes (Signed)
 [facility]    CSN: [id number] Arrival date & time: 07/16/24  1220      History   Chief Complaint Chief Complaint  Patient presents with   Otalgia    HPI Angel Dawson is a 43 y.o. female presents for ear pain.  Patient states she woke this morning with right ear pain and muffled hearing.  States has history of pulsating tinnitus and she is already seeing ENT for and had multiple MRIs with no designated cause.  No drainage from the ear, fevers or URI symptoms.  No OTC medications have been new since onset.  No other concerns at this time.   Otalgia   Past Medical History:  Diagnosis Date   Abdominal pain    related to gallstones   Achilles tendonitis, bilateral    Back pain    Bronchitis    02/17   Chest pain    Constipation    Diabetes (HCC)    Type II   DKA (diabetic ketoacidoses) 08/25/2014   Gallstones    GERD (gastroesophageal reflux disease)    Headache    migraines   Hypertension    Joint pain    Lattice degeneration, both eyes    Palpitations    Pancreatitis    PCOS (polycystic ovarian syndrome)    Sinus congestion    occ. uses OTC meds for this   Sleep apnea    Vitamin D  deficiency     Patient Active Problem List   Diagnosis Date Noted   Menorrhagia 01/20/2023   Insulin  resistance 12/01/2022   Other fatigue 11/13/2022   SOBOE (shortness of breath on exertion) 11/13/2022   Other chronic pain 11/13/2022   Other hyperlipidemia 11/13/2022   Depression 11/13/2022   Type 2 diabetes mellitus with other specified complication (HCC) 08/19/2022   Essential hypertension 08/19/2022   Tinnitus of both ears 12/05/2021   Deviated septum 01/11/2021   Nasal turbinate hypertrophy 04/08/2018   Obstructive sleep apnea treated with continuous positive airway pressure (CPAP) 12/29/2017   Onychomycosis 10/26/2014   Gastroesophageal reflux disease without esophagitis 09/28/2014   morbid obesity with starting BMI 55 08/25/2014   Chronic rhinitis  06/30/2014   Pancreatitis, acute 07/02/2013   Status post laparoscopic cholecystectomy 06/24/2013   Gallstones 04/13/2013   Low back pain 08/09/2012   Vitamin D  deficiency 08/09/2012    Past Surgical History:  Procedure Laterality Date   CHOLECYSTECTOMY N/A 06/22/2013   Procedure: LAPAROSCOPIC CHOLECYSTECTOMY ;  Surgeon: Elon CHRISTELLA Pacini, MD;  Location: [facility];  Service: General;  Laterality: N/A;   DILITATION & CURRETTAGE/HYSTROSCOPY WITH HYDROTHERMAL ABLATION N/A 01/03/2016   Procedure: DILATATION & CURETTAGE/HYSTEROSCOPY WITH HYDROTHERMAL ABLATION;  Surgeon: [name], MD;  Location: [facility];  Service: Gynecology;  Laterality: N/A;  HTA rep will be attend confirmed by office 12/12/15    NASAL SEPTOPLASTY W/ TURBINOPLASTY Bilateral 01/11/2021   Procedure: NASAL SEPTOPLASTY WITH TURBINATE REDUCTION;  Surgeon: [name], MD;  Location: [facility];  Service: ENT;  Laterality: Bilateral;   TONSILLECTOMY      OB History     Gravida  0   Para  0   Term  0   Preterm  0   AB  0   Living  0      SAB  0   IAB  0   Ectopic  0   Multiple  0   Live Births  0            Home Medications  Prior to Admission medications   Medication Sig Start Date End Date Taking? Authorizing Provider  amoxicillin  (AMOXIL ) 875 MG tablet Take 1 tablet (875 mg total) by mouth 2 (two) times daily for 10 days. 07/16/24 07/26/24 Yes Lancer Thurner, Jodi R, NP  Multiple Vitamin (MULTIVITAMIN) tablet Take 1 tablet by mouth daily.   Yes [provider]  omega-3 acid ethyl esters (LOVAZA) 1 g capsule Take 2 g by mouth 2 (two) times daily.   Yes [provider]  predniSONE  (DELTASONE ) 20 MG tablet Take 2 tablets (40 mg total) by mouth daily with breakfast for 5 days. 07/16/24 07/21/24 Yes Eliakim Tendler, Jodi R, NP  albuterol  (VENTOLIN  HFA) 108 (90 Base) MCG/ACT inhaler Inhale 1-2 puffs into the lungs every 6 (six) hours as needed for wheezing or shortness of breath. 05/06/23   Dreama, [place]  N,  FNP  Blood Glucose Monitoring Suppl (BLOOD GLUCOSE METER KIT AND SUPPLIES) Dispense based on patient and insurance preference. Use up to four times daily as directed. (FOR ICD-9 250.00, 250.01). 08/26/14   [name], [name], MD  Cholecalciferol 1.25 MG (50000 UT) capsule     [provider]  clotrimazole  (MYCELEX ) 10 MG troche Take 1 tablet (10 mg total) by mouth 5 (five) times daily. 04/27/24   [name], [name], MD  Continuous Blood Gluc Sensor (FREESTYLE LIBRE 3 SENSOR) MISC APPLY SENSOR ON UPPER ARM EVERY 14 DAYS FOR CONTINUOUS GLUCOSE MONITORING 06/06/22   [name], MD  Continuous Blood Gluc Sensor (FREESTYLE LIBRE 3 SENSOR) MISC Use for continuous blood glucose monitoring 08/20/22   [name], [name], MD  Continuous Glucose Sensor (FREESTYLE LIBRE 3 PLUS SENSOR) MISC Change sensor every 15 days. 02/22/24   [name], [name], MD  glucose blood (ONETOUCH VERIO) test strip USE TO TEST BLOOD SUGAR ONCE DAILY 07/24/23   [name], [name], MD  lisinopril -hydrochlorothiazide  (ZESTORETIC ) 10-12.5 MG tablet Take 1 tablet by mouth daily as needed (if BP elevates). 06/13/23   [provider]  lisinopril -hydrochlorothiazide  (ZESTORETIC ) 20-25 MG tablet Take 1 tablet by mouth daily. 02/22/24   [name], Kemesha C, MD  Multiple Vitamins-Minerals (MULTIVITAMIN WITH MINERALS) tablet Take 1 tablet by mouth daily. With Diabetic Support    [provider]  OneTouch Delica Lancets 33G MISC Use once daily to check glucose. 07/24/23   [name], [name], MD  Probiotic Product (PROBIOTIC-10 PO) Take 1 capsule by mouth daily. Takes Fortify Women's Digestive Probiotic    [provider]  rosuvastatin  (CRESTOR ) 5 MG tablet Take 1 tablet (5 mg total) by mouth daily. 02/22/24   [name], [name], MD  tirzepatide  (MOUNJARO ) 10 MG/0.5ML Pen Inject 10 mg into the skin once a week. 02/22/24   [name], [name], MD  VITAMIN D  PO Take 5,000 Units by mouth daily.    [provider]     Family History Family History  Problem Relation Age of Onset   Sleep apnea Mother    Thyroid  disease Mother    Hypertension Mother    Diabetes Mother    Goiter Mother    Anxiety disorder Mother    Obesity Mother    Anxiety disorder Father    Hypertension Father    Diabetes Father    Asthma Father    Bipolar disorder Father    Depression Father    Lung cancer Paternal Grandmother        smoker-heavy   Stroke Paternal Grandfather    Diabetes Maternal Aunt    Colon cancer Neg Hx  Heart disease Neg Hx     Social History Social History   Tobacco Use   Smoking status: Never   Smokeless tobacco: Never  Vaping Use   Vaping status: Never Used  Substance Use Topics   Alcohol use: Not Currently    Alcohol/week: 0.0 standard drinks of alcohol    Comment: 1 drink per month   Drug use: No     Allergies   Patient has no known allergies.   Review of Systems Review of Systems  HENT:  Positive for ear pain.      Physical Exam Triage Vital Signs ED Triage Vitals  Encounter Vitals Group     BP 07/16/24 1301 136/86     Girls Systolic BP Percentile --      Girls Diastolic BP Percentile --      Boys Systolic BP Percentile --      Boys Diastolic BP Percentile --      Pulse Rate 07/16/24 1301 88     Resp 07/16/24 1301 20     Temp 07/16/24 1301 98.9 F (37.2 C)     Temp Source 07/16/24 1301 Oral     SpO2 07/16/24 1301 97 %     Weight --      Height --      Head Circumference --      Peak Flow --      Pain Score 07/16/24 1258 2     Pain Loc --      Pain Education --      Exclude from Growth Chart --    No data found.  Updated Vital Signs BP 136/86   Pulse 88   Temp 98.9 F (37.2 C) (Oral)   Resp 20   SpO2 97%   Visual Acuity Right Eye Distance:   Left Eye Distance:   Bilateral Distance:    Right Eye Near:   Left Eye Near:    Bilateral Near:     Physical Exam Vitals and nursing note reviewed.  Constitutional:      General: She is not in  acute distress.    Appearance: Normal appearance. She is not ill-appearing.  HENT:     Head: Normocephalic and atraumatic.     Right Ear: Ear canal normal. A middle ear effusion is present. Tympanic membrane is erythematous.     Left Ear: Tympanic membrane and ear canal normal.  Eyes:     Pupils: Pupils are equal, round, and reactive to light.  Cardiovascular:     Rate and Rhythm: Normal rate.  Pulmonary:     Effort: Pulmonary effort is normal.  Skin:    General: Skin is warm and dry.  Neurological:     General: No focal deficit present.     Mental Status: She is alert and oriented to person, place, and time.  Psychiatric:        Mood and Affect: Mood normal.        Behavior: Behavior normal.      UC Treatments / Results  Labs (all labs ordered are listed, but only abnormal results are displayed) Labs Reviewed - No data to display  EKG   Radiology No results found.  Procedures Procedures (including critical care time)  Medications Ordered in UC Medications - No data to display  Initial Impression / Assessment and Plan / UC Course  I have reviewed the triage vital signs and the nursing notes.  Pertinent labs & imaging results that were available during my care of the  patient were reviewed by me and considered in my medical decision making (see chart for details).     Reviewed exam and symptoms with patient.  Will start amoxicillin  and prednisone .  She will continue Flonase  and her daily allergy medicine.  Advise ENT follow-up or PCP follow-up if symptoms do not improve.  ER precautions reviewed. Final Clinical Impressions(s) / UC Diagnoses   Final diagnoses:  Non-recurrent acute serous otitis media of right ear     Discharge Instructions      Start amoxicillin  twice daily for 10 days.  Start prednisone  daily for 5 days.  Continue Flonase  and your allergy medicine daily as well.  Please follow-up with your ear nose and throat or PCP if your symptoms do not  improve.  Please go to the ER for any worsening symptoms.  Hope you feel better soon exclamation    ED Prescriptions     Medication Sig Dispense Auth. Provider   amoxicillin  (AMOXIL ) 875 MG tablet Take 1 tablet (875 mg total) by mouth 2 (two) times daily for 10 days. 20 tablet Irving Bloor, Jodi R, NP   predniSONE  (DELTASONE ) 20 MG tablet Take 2 tablets (40 mg total) by mouth daily with breakfast for 5 days. 10 tablet Esly Selvage, Jodi R, NP      PDMP not reviewed this encounter.   Loreda Myla SAUNDERS, NP 07/16/24 1319

## 2024-07-25 ENCOUNTER — Ambulatory Visit (INDEPENDENT_AMBULATORY_CARE_PROVIDER_SITE_OTHER)

## 2024-07-25 ENCOUNTER — Other Ambulatory Visit (HOSPITAL_BASED_OUTPATIENT_CLINIC_OR_DEPARTMENT_OTHER): Payer: Self-pay

## 2024-07-25 ENCOUNTER — Ambulatory Visit (HOSPITAL_BASED_OUTPATIENT_CLINIC_OR_DEPARTMENT_OTHER): Admitting: Student

## 2024-07-25 ENCOUNTER — Other Ambulatory Visit (HOSPITAL_BASED_OUTPATIENT_CLINIC_OR_DEPARTMENT_OTHER)

## 2024-07-25 DIAGNOSIS — M5442 Lumbago with sciatica, left side: Secondary | ICD-10-CM

## 2024-07-25 DIAGNOSIS — M545 Low back pain, unspecified: Secondary | ICD-10-CM

## 2024-07-25 DIAGNOSIS — M48061 Spinal stenosis, lumbar region without neurogenic claudication: Secondary | ICD-10-CM | POA: Diagnosis not present

## 2024-07-25 DIAGNOSIS — M4316 Spondylolisthesis, lumbar region: Secondary | ICD-10-CM | POA: Diagnosis not present

## 2024-07-25 DIAGNOSIS — R102 Pelvic and perineal pain unspecified side: Secondary | ICD-10-CM | POA: Diagnosis not present

## 2024-07-25 DIAGNOSIS — M5136 Other intervertebral disc degeneration, lumbar region with discogenic back pain only: Secondary | ICD-10-CM | POA: Diagnosis not present

## 2024-07-25 DIAGNOSIS — G8929 Other chronic pain: Secondary | ICD-10-CM

## 2024-07-25 DIAGNOSIS — M438X6 Other specified deforming dorsopathies, lumbar region: Secondary | ICD-10-CM | POA: Diagnosis not present

## 2024-07-25 MED ORDER — METHOCARBAMOL 500 MG PO TABS
500.0000 mg | ORAL_TABLET | Freq: Four times a day (QID) | ORAL | 0 refills | Status: AC | PRN
  Filled 2024-07-25: qty 56, 14d supply, fill #0

## 2024-07-25 NOTE — Progress Notes (Signed)
 Chief Complaint: Low back pain    Discussed the use of AI scribe software for clinical note transcription with the patient, who gave verbal consent to proceed.  History of Present Illness Angel Dawson is a 43 year old female who presents with acute on chronic low back pain and muscle spasms.  She experiences sharp, sudden pains in her low back and hip area bilaterally, occurring five to six times daily, each lasting less than ten seconds. The pain is severe and unpredictable, occurring during activities such as showering and cooking. She was on 40 mg of prednisone  daily for five days for an ear infection, stopping abruptly about a week and a half ago, coinciding with the onset of her back pain. She has chronic back pain attributed to a fall 18 years ago. She reports sciatica-like symptoms on the left side, with numbness and tingling extending to her knee, particularly after physical activity. She manages her pain with Advil  Back and Body, reducing it from an eight to a two, and uses delta-9 gummies for sleep. She has not engaged in physical therapy for her back but has done so for Achilles tendinitis, managed with stretching and K-tape.   Surgical History:   None  PMH/PSH/Family History/Social History/Meds/Allergies:    Past Medical History:  Diagnosis Date   Abdominal pain    related to gallstones   Achilles tendonitis, bilateral    Back pain    Bronchitis    02/17   Chest pain    Constipation    Diabetes (HCC)    Type II   DKA (diabetic ketoacidoses) 08/25/2014   Gallstones    GERD (gastroesophageal reflux disease)    Headache    migraines   Hypertension    Joint pain    Lattice degeneration, both eyes    Palpitations    Pancreatitis    PCOS (polycystic ovarian syndrome)    Sinus congestion    occ. uses OTC meds for this   Sleep apnea    Vitamin D  deficiency    Past Surgical History:  Procedure Laterality Date   CHOLECYSTECTOMY  N/A 06/22/2013   Procedure: LAPAROSCOPIC CHOLECYSTECTOMY ;  Surgeon: Elon CHRISTELLA Pacini, MD;  Location: [facility];  Service: General;  Laterality: N/A;   DILITATION & CURRETTAGE/HYSTROSCOPY WITH HYDROTHERMAL ABLATION N/A 01/03/2016   Procedure: DILATATION & CURETTAGE/HYSTEROSCOPY WITH HYDROTHERMAL ABLATION;  Surgeon: [name], MD;  Location: [facility];  Service: Gynecology;  Laterality: N/A;  HTA rep will be attend confirmed by office 12/12/15    NASAL SEPTOPLASTY W/ TURBINOPLASTY Bilateral 01/11/2021   Procedure: NASAL SEPTOPLASTY WITH TURBINATE REDUCTION;  Surgeon: [name], MD;  Location: [facility] OR;  Service: ENT;  Laterality: Bilateral;   TONSILLECTOMY     Social History   Socioeconomic History   Marital status: Single    Spouse name: Not on file   Number of children: 0   Years of education: Not on file   Highest education level: Bachelor's degree (e.g., BA, AB, BS)  Occupational History   Occupation: Soil scientist: [employer]  Tobacco Use   Smoking status: Never   Smokeless tobacco: Never  Vaping Use   Vaping status: Never Used  Substance and Sexual Activity   Alcohol use: Not Currently    Alcohol/week: 0.0 standard drinks of alcohol  Comment: 1 drink per month   Drug use: No   Sexual activity: Never    Birth control/protection: None  Other Topics Concern   Not on file  Social History Narrative   Caffeine: bang energy drinks 300 mg/can, 1 can per day, maybe a 12 oz iced coffee   Social Drivers of Corporate investment banker Strain: Low Risk  (07/06/2023)   Overall Financial Resource Strain (CARDIA)    Difficulty of Paying Living Expenses: Not hard at all  Food Insecurity: Food Insecurity Present (07/06/2023)   Hunger Vital Sign    Worried About Running Out of Food in the Last Year: Sometimes true    Ran Out of Food in the Last Year: Never true  Transportation Needs: No Transportation Needs (07/06/2023)   PRAPARE - Scientist, research (physical sciences) (Medical): No    Lack of Transportation (Non-Medical): No  Physical Activity: Insufficiently Active (07/06/2023)   Exercise Vital Sign    Days of Exercise per Week: 1 day    Minutes of Exercise per Session: 10 min  Stress: No Stress Concern Present (07/06/2023)   [employer] of Occupational Health - Occupational Stress Questionnaire    Feeling of Stress : Only a little  Social Connections: Moderately Integrated (07/06/2023)   Social Connection and Isolation Panel    Frequency of Communication with Friends and Family: More than three times a week    Frequency of Social Gatherings with Friends and Family: Three times a week    Attends Religious Services: More than 4 times per year    Active Member of Clubs or Organizations: Yes    Attends Engineer, structural: More than 4 times per year    Marital Status: Never married   Family History  Problem Relation Age of Onset   Sleep apnea Mother    Thyroid  disease Mother    Hypertension Mother    Diabetes Mother    Goiter Mother    Anxiety disorder Mother    Obesity Mother    Anxiety disorder Father    Hypertension Father    Diabetes Father    Asthma Father    Bipolar disorder Father    Depression Father    Lung cancer Paternal Grandmother        smoker-heavy   Stroke Paternal Grandfather    Diabetes Maternal Aunt    Colon cancer Neg Hx    Heart disease Neg Hx    No Known Allergies Current Outpatient Medications  Medication Sig Dispense Refill   albuterol  (VENTOLIN  HFA) 108 (90 Base) MCG/ACT inhaler Inhale 1-2 puffs into the lungs every 6 (six) hours as needed for wheezing or shortness of breath. 18 g 0   amoxicillin  (AMOXIL ) 875 MG tablet Take 1 tablet (875 mg total) by mouth 2 (two) times daily for 10 days. 20 tablet 0   Blood Glucose Monitoring Suppl (BLOOD GLUCOSE METER KIT AND SUPPLIES) Dispense based on patient and insurance preference. Use up to four times daily as directed. (FOR ICD-9 250.00,  250.01). 1 each 0   Cholecalciferol 1.25 MG (50000 UT) capsule  (Patient not taking: Reported on 08/27/2023)     clotrimazole  (MYCELEX ) 10 MG troche Take 1 tablet (10 mg total) by mouth 5 (five) times daily. 35 Troche 0   Continuous Blood Gluc Sensor (FREESTYLE LIBRE 3 SENSOR) MISC APPLY SENSOR ON UPPER ARM EVERY 14 DAYS FOR CONTINUOUS GLUCOSE MONITORING 2 each 2   Continuous Blood Gluc Sensor (FREESTYLE LIBRE 3 SENSOR)  MISC Use for continuous blood glucose monitoring 6 each 3   Continuous Glucose Sensor (FREESTYLE LIBRE 3 PLUS SENSOR) MISC Change sensor every 15 days. 6 each 3   glucose blood (ONETOUCH VERIO) test strip USE TO TEST BLOOD SUGAR ONCE DAILY 100 strip 3   lisinopril -hydrochlorothiazide  (ZESTORETIC ) 10-12.5 MG tablet Take 1 tablet by mouth daily as needed (if BP elevates).     lisinopril -hydrochlorothiazide  (ZESTORETIC ) 20-25 MG tablet Take 1 tablet by mouth daily. 90 tablet 3   Multiple Vitamin (MULTIVITAMIN) tablet Take 1 tablet by mouth daily.     Multiple Vitamins-Minerals (MULTIVITAMIN WITH MINERALS) tablet Take 1 tablet by mouth daily. With Diabetic Support     omega-3 acid ethyl esters (LOVAZA) 1 g capsule Take 2 g by mouth 2 (two) times daily.     OneTouch Delica Lancets 33G MISC Use once daily to check glucose. 100 each 1   Probiotic Product (PROBIOTIC-10 PO) Take 1 capsule by mouth daily. Takes Fortify Women's Digestive Probiotic     rosuvastatin  (CRESTOR ) 5 MG tablet Take 1 tablet (5 mg total) by mouth daily. 90 tablet 3   tirzepatide  (MOUNJARO ) 10 MG/0.5ML Pen Inject 10 mg into the skin once a week. 6 mL 3   VITAMIN D  PO Take 5,000 Units by mouth daily.     No current facility-administered medications for this visit.   No results found.  Review of Systems:   A ROS was performed including pertinent positives and negatives as documented in the HPI.  Physical Exam :   Constitutional: NAD and appears stated age Neurological: Alert and oriented Psych: Appropriate  affect and cooperative There were no vitals taken for this visit.   Comprehensive Musculoskeletal Exam:    No significant tenderness with palpation of the lumbar midline or paraspinal musculature.  Passive hip range of motion fluid and without discomfort bilaterally to 110 flexion, 30 ER, 20 IR.  Negative straight leg raise bilaterally.  Bilateral knee flexion/extension ankle dorsiflexion/plantarflexion strength 5/5.  Imaging:   Xray (lumbar spine 4 views, AP pelvis): Low-grade anterolisthesis of L4 on L5.  Otherwise normal lordotic alignment with well-preserved intervertebral disc spacing.  Bilateral hip joint spaces are well-maintained.   I personally reviewed and interpreted the radiographs.      Assessment & Plan Low back pain with bilateral muscle spasms   Acute on chronic low back pain is consistent with muscle spasms.  Symptoms occur intermittently but are severe and debilitating.  X-rays show a low-grade L4-L5 spondylolisthesis but otherwise negative.  Patient has a history of left-sided sciatica which has never formally been treated. Prescribe Robaxin  500 mg as needed up to four times daily, cautioning about drowsiness. Recommend heat therapy for muscle relaxation and refer to physical therapy for her low back and sciatica.  Can plan to follow-up as needed.      I personally saw and evaluated the patient, and participated in the management and treatment plan.  Leonce Reveal, PA-C Orthopedics

## 2024-07-26 ENCOUNTER — Ambulatory Visit
Admission: RE | Admit: 2024-07-26 | Discharge: 2024-07-26 | Disposition: A | Discharge status: Home / Self Care | Source: Ambulatory Visit | Attending: Family Medicine | Admitting: Family Medicine

## 2024-07-26 ENCOUNTER — Other Ambulatory Visit (HOSPITAL_COMMUNITY)
Admission: RE | Admit: 2024-07-26 | Discharge: 2024-07-26 | Disposition: A | Discharge status: Home / Self Care | Source: Ambulatory Visit | Attending: Student | Admitting: Student

## 2024-07-26 DIAGNOSIS — E041 Nontoxic single thyroid nodule: Secondary | ICD-10-CM

## 2024-07-28 ENCOUNTER — Encounter: Payer: Self-pay | Admitting: Family Medicine

## 2024-07-28 LAB — CYTOLOGY - NON PAP

## 2024-08-01 ENCOUNTER — Ambulatory Visit (AMBULATORY_SURGERY_CENTER)

## 2024-08-01 VITALS — Ht 71.5 in | Wt >= 6400 oz

## 2024-08-01 DIAGNOSIS — Z1211 Encounter for screening for malignant neoplasm of colon: Secondary | ICD-10-CM

## 2024-08-01 MED ORDER — NA SULFATE-K SULFATE-MG SULF 17.5-3.13-1.6 GM/177ML PO SOLN: 1.0000 | Freq: Once | ORAL | 0 refills | Status: AC

## 2024-08-01 NOTE — Progress Notes (Signed)
 No egg or soy allergy known to patient  No issues known to pt with past sedation with any surgeries or procedures  Patient denies ever being told they had issues or difficulty with intubation- NARROW AIRWAY PER PATIENT, SLEEP APNEA, TONGUE USUALLY FALLS BACK PER PATIENT.    No FH of Malignant Hyperthermia Pt is not on diet pills Pt is not on  home 02  Pt is not on blood thinners  Pt denies issues with constipation  No A fib or A flutter Have any cardiac testing pending--NO Pt can ambulate-INDEPENDENTLY Pt denies use of chewing tobacco Discussed diabetic I weight loss medication holds-MOUNJARO  Discussed NSAID holds Checked BMI Pt instructed to use Singlecare.com or GoodRx for a price reduction on prep  Patient's chart reviewed by [name] Schillings CNRA prior to previsit and patient appropriate for the [facility].  Pre visit completed and red dot placed by patient's name on their procedure day (on provider's schedule).

## 2024-08-14 NOTE — Therapy (Signed)
 OUTPATIENT PHYSICAL THERAPY THORACOLUMBAR EVALUATION   Patient Name: Angel Dawson MRN: [id number] DOB:[birth year], 43 y.o., 43 y.o., female Today's Date: 08/16/2024  END OF SESSION:    Past Medical History:  Diagnosis Date   Abdominal pain    related to gallstones   Achilles tendonitis, bilateral    Back pain    Bronchitis    02/17   Chest pain    Constipation    Diabetes (HCC)    Type II   DKA (diabetic ketoacidoses) 08/25/2014   Gallstones    GERD (gastroesophageal reflux disease)    Headache    migraines   Hypertension    Joint pain    Lattice degeneration, both eyes    Palpitations    Pancreatitis    PCOS (polycystic ovarian syndrome)    Sinus congestion    occ. uses OTC meds for this   Sleep apnea    USES CPAP   Vitamin D  deficiency    Past Surgical History:  Procedure Laterality Date   CHOLECYSTECTOMY N/A 06/22/2013   Procedure: LAPAROSCOPIC CHOLECYSTECTOMY ;  Surgeon: Elon CHRISTELLA Pacini, MD;  Location: [facility];  Service: General;  Laterality: N/A;   DILITATION & CURRETTAGE/HYSTROSCOPY WITH HYDROTHERMAL ABLATION N/A 01/03/2016   Procedure: DILATATION & CURETTAGE/HYSTEROSCOPY WITH HYDROTHERMAL ABLATION;  Surgeon: [name], MD;  Location: [facility];  Service: Gynecology;  Laterality: N/A;  HTA rep will be attend confirmed by office 12/12/15    NASAL SEPTOPLASTY W/ TURBINOPLASTY Bilateral 01/11/2021   Procedure: NASAL SEPTOPLASTY WITH TURBINATE REDUCTION;  Surgeon: [name], MD;  Location: [facility] OR;  Service: ENT;  Laterality: Bilateral;   TONSILLECTOMY     Patient Active Problem List   Diagnosis Date Noted   Menorrhagia 01/20/2023   Insulin  resistance 12/01/2022   Other fatigue 11/13/2022   SOBOE (shortness of breath on exertion) 11/13/2022   Other chronic pain 11/13/2022   Other hyperlipidemia 11/13/2022   Depression 11/13/2022   Type 2 diabetes mellitus with other specified complication (HCC) 08/19/2022   Essential hypertension 08/19/2022   Tinnitus of  both ears 12/05/2021   Deviated septum 01/11/2021   Nasal turbinate hypertrophy 04/08/2018   Obstructive sleep apnea treated with continuous positive airway pressure (CPAP) 12/29/2017   Onychomycosis 10/26/2014   Gastroesophageal reflux disease without esophagitis 09/28/2014   morbid obesity with starting BMI 55 08/25/2014   Chronic rhinitis 06/30/2014   Pancreatitis, acute 07/02/2013   Status post laparoscopic cholecystectomy 06/24/2013   Gallstones 04/13/2013   Low back pain 08/09/2012   Vitamin D  deficiency 08/09/2012    PCP: [name], [name], MD   REFERRING PROVIDER: Emiliano Leonce CROME, PA*   REFERRING DIAG: [phone] (ICD-10-CM) - Chronic bilateral low back pain with left-sided sciatica  THERAPY DIAG:  Other low back pain  Sciatica, left side  Abnormal posture  Difficulty in walking, not elsewhere classified  RATIONALE FOR EVALUATION AND TREATMENT: Rehabilitation  ONSET DATE:   NEXT MD VISIT:    SUBJECTIVE:  SUBJECTIVE STATEMENT: 43 y/o patient referred to PT from orthopedics for acute on chronic LBP w/ L sciatica.   She reports that she has a h/o L lower back pain with radiation into the L hip and thigh.  This started 18-20 yrs ago when she fell on her back in the bathtub.   However, 2-3 weeks ago she started having sharp sudden intense B back pain/spasm that felt like an electric jolt that would hit her for 5-10 seconds many times daily and then be gone.   She denies any trauma such as falling or any changes in activity level with lifting/travel/etc.   The common factor sees to be the pain occurs when she is in a standing position.   The pain doesn't occur when she is sitting or lying down.   Sleep is not disrupted.  Going up steps increases her general sore back  pain.  She has to lead with the RLE going up steps due to LLE weaknes.    Sit to stand transfers can increase the pain.     PAIN: Are you having pain? Yes: NPRS scale: 3/10 now;  10/10 sudden/quick pain Pain location: low back Pain description: sharp and stabbing for 10-20 sec;  achy and radiating other time Aggravating factors: standing with back extension seems to precipitate Relieving factors: sitting/lying  PERTINENT HISTORY:  NIDDM 2, HTN, h/o pancreatitis, palpitations, B Achilles tendonitis  PRECAUTIONS: None  RED FLAGS: None  WEIGHT BEARING RESTRICTIONS: No  FALLS:  Has patient fallen in last 6 months? No  LIVING ENVIRONMENT: Lives with: lives alone Lives in: House/apartment Stairs: Yes: External: 3 steps; none Has following equipment at home: None  OCCUPATION: administrative desk work 7 hrs daily  PLOF: Independent  PATIENT GOALS: not hurt in the back   OBJECTIVE: (objective measures completed at initial evaluation unless otherwise dated)  DIAGNOSTIC FINDINGS:  EXAM: LUMBAR SPINE - COMPLETE 4+ VIEW   COMPARISON:  07/29/2021   FINDINGS: Five non-rib-bearing lumbar vertebra. Slight dextroscoliotic curvature at L4-L5. 8 mm grade 1 anterolisthesis of L4 on L5. No change in alignment on flexion or extension. Mild L4-L5 disc space narrowing and spurring. Mild multilevel facet hypertrophy. No evidence of pars defects or focal bone abnormalities right upper quadrant surgical clips again seen.   IMPRESSION: 1. Grade 1 anterolisthesis of L4 on L5. No change in alignment on flexion or extension. 2. Mild L4-L5 degenerative disc disease. Mild multilevel facet hypertrophy.     Electronically Signed   By: Andrea Gasman M.D.   On: 07/25/2024 16:57EXAM: PELVIS - 1-2 VIEW   COMPARISON:  None Available.   FINDINGS: The hip joint spaces are preserved. Both femoral heads are well seated in the respective acetabula. No fracture. No evidence of erosion or  avascular necrosis. The pubic symphysis and sacroiliac joints are congruent. Left pelvic phleboliths.   IMPRESSION: Negative radiograph of the pelvis.     Electronically Signed   By: Andrea Gasman M.D.   On: 07/25/2024 16:58  PATIENT SURVEYS:  Modified Oswestry:  MODIFIED OSWESTRY DISABILITY SCALE  Date: 08/16/2024 Score  Pain intensity 4 =  Pain medication provides me with little relief from pain.  2. Personal care (washing, dressing, etc.) 2 =  It is painful to take care of myself, and I am slow and careful.  3. Lifting 1 = I can lift heavy weights, but it causes increased pain.  4. Walking 2 =  Pain prevents me from walking more than  mile.  5. Sitting 1 =  I can only sit in my favorite chair as long as I like.  6. Standing 3 =  Pain prevents me from standing more than 1/2 hour.  7. Sleeping 2 =  Even when I take pain medication, I sleep less than 6 hours  8. Social Life 3 =  Pain prevents me from going out very often.  9. Traveling 3 = My pain restricts my travel over 1 hour  10. Employment/ Homemaking 1 = My normal homemaking/job activities increase my pain, but I can still perform all that is required of me  Total 22/50   Interpretation of scores: Score Category Description  0-20% Minimal Disability The patient can cope with most living activities. Usually no treatment is indicated apart from advice on lifting, sitting and exercise  21-40% Moderate Disability The patient experiences more pain and difficulty with sitting, lifting and standing. Travel and social life are more difficult and they may be disabled from work. Personal care, sexual activity and sleeping are not grossly affected, and the patient can usually be managed by conservative means  41-60% Severe Disability Pain remains the main problem in this group, but activities of daily living are affected. These patients require a detailed investigation  61-80% Crippled Back pain impinges on all aspects of the patient's  life. Positive intervention is required  81-100% Bed-bound These patients are either bed-bound or exaggerating their symptoms  Bluford FORBES Zoe DELENA Karon DELENA, et al. Surgery versus conservative management of stable thoracolumbar fracture: the PRESTO feasibility RCT. [place] ([place]): [employer]; 2021 Nov. [facility] Technology Assessment, No. 25.62.) Appendix 3, Oswestry Disability Index category descriptors. Available from: Findjewelers.cz  Minimally Clinically Important Difference (MCID) = 12.8%  SCREENING FOR RED FLAGS: Bowel or bladder incontinence: No Spinal tumors: No Cauda equina syndrome: No Compression fracture: No Abdominal aneurysm: No  COGNITION:  Overall cognitive status: Within functional limits for tasks assessed    SENSATION: WFL  POSTURE:  increased lumbar lordosis  LLE is longer in supine at the malleolar level  PALPATION: TTP over B lumbar paraspinals and L SI joint;   P/A lumbar vertebral glides are hypermobile feeling  LUMBAR ROM:   Active  Eval  Flexion Just below knees; no pain  Extension 100% ROM;  end range reproduces her sharp/brief intense pain  Right lateral flexion Below knee  Left lateral flexion Below knee  Right rotation 100%  Left rotation 100%  (Blank rows = not tested)  MUSCLE LENGTH: Hamstrings: Right SLR = 85 deg; Left SLR = 90+ deg Thomas test: Right  deg; Left  deg Hamstrings: not tight ITB: not tight Piriformis: moderately tight L and less so R Hip flexors: not tight Quads: not tight Heelcord: NT  LOWER EXTREMITY ROM:     Active  Right eval Left eval  Hip flexion    Hip extension    Hip abduction    Hip adduction    Hip internal rotation    Hip external rotation    Knee flexion    Knee extension    Ankle dorsiflexion    Ankle plantarflexion    Ankle inversion    Ankle eversion    (Blank rows = not tested)  LOWER EXTREMITY MMT:    MMT Right eval Left eval  Hip flexion     Hip extension    Hip abduction    Hip adduction    Hip internal rotation    Hip external rotation    Knee flexion    Knee extension  Ankle dorsiflexion    Ankle plantarflexion    Ankle inversion    Ankle eversion     (Blank rows = not tested)  LUMBAR SPECIAL TESTS:  Straight leg raise test: Negative, Slump test: Negative, SI Compression/distraction test: Negative, FABER test: Positive, and Gaenslen's test: Positive on the LLE Long sit test is long to short on the RLE indicating an anteriorly rotated inominate on the RLE vs. Posterior on LLE  FUNCTIONAL TESTS:  TBD  GAIT: Distance walked: into clinic Assistive device utilized: None Level of assistance: Complete Independence Gait pattern: supinates on the R, pronates on the LLE;  sway backed posture Comments:    TODAY'S TREATMENT:  08/15/24 SELF CARE: Provided education on PT POC progression; initial HEP;  postural correction w/ PPT throughout the day;  avoiding lumbar extension   PATIENT EDUCATION:  Education details: PT eval findings, anticipated POC, and initial HEP  Person educated: Patient Education method: Explanation, Verbal cues, Tactile cues, Handouts, and MedBridgeGO app access provided Education comprehension: verbalized understanding, verbal cues required, tactile cues required, and needs further education  HOME EXERCISE PROGRAM: Access Code: [email or web address] URL: [email or web address] Date: 08/15/2024 Prepared by: Garnette Montclair  Exercises - Supine Posterior Pelvic Tilt  - 1 x daily - 7 x weekly - 3 sets - 10 reps - Quadruped Cat with Posterior Pelvic Tilt  - 1 x daily - 7 x weekly - 3 sets - 10 reps - Curl Up with Reach  - 1 x daily - 7 x weekly - 3 sets - 10 reps - Diagonal Curl Up with Reach  - 1 x daily - 7 x weekly - 3 sets - 10 reps - Supine Piriformis Stretch with Leg Straight  - 1 x daily - 7 x weekly - 1 sets - 2 reps - 1 min hold   ASSESSMENT:  CLINICAL IMPRESSION: Angel Dawson is a 43 y.o., 43 y.o. female who was referred to physical therapy for evaluation and treatment for acute on chronic LBP w/ sciatica.   Patient reports onset of LBP pain beginning 2-3 weeks ago for no apparent reason. She has some chronic L low back pain with sciatica into the LLE, but reports this is normally just soreness/achiness.    The pain she is getting now is sudden, sharp, severe and only last 5-10 seconds, but it nearly brings her to her knees.    Pain is worse with with standing activities.   She does not normally get the pain with sitting or lying down.  Her posture is significantly accentuated lumbar lordosis and she reproduces the sharp pain with end range lumbar extension.   She also has piriformis tightness, hip weakness, and tenderness to palpate. Her LLE is a little longer than the RLE.   She is generally hyperflexible and hypermobile.   MRI shows some spondylolisthesis of L4 on L5 with DDD changes.    Patient has deficits in  hip flexibility, BLE strength, abnormal posture, and TTP with pain in the low back and hips which are interfering with ADLs and are impacting quality of life.  On Modified Oswestry patient scored 22/50 demonstrating 44% or moderate disability.  Angel Dawson will benefit from skilled PT to address above deficits to improve mobility and activity tolerance with decreased pain interference.     OBJECTIVE IMPAIRMENTS: difficulty walking, decreased strength, postural dysfunction, and pain.   ACTIVITY LIMITATIONS: carrying, standing, stairs, and locomotion level  PARTICIPATION LIMITATIONS: meal prep, cleaning, laundry, shopping, and community activity  PERSONAL FACTORS: Fitness, Time since  onset of injury/illness/exacerbation, and 1-2 comorbidities: NIDDM 2, HTN, h/o pancreatitis, palpitations are also affecting patient's functional outcome.   REHAB POTENTIAL: Good  CLINICAL DECISION MAKING: Evolving/moderate complexity  EVALUATION COMPLEXITY: Moderate   GOALS: Goals  reviewed with patient? Yes  SHORT TERM GOALS: Target date: 09/12/2024    Patient will be independent with initial HEP to improve outcomes and carryover.  Baseline: 100% PT assist required for correct completion Goal status: INITIAL  2.  Patient will report 25% improvement in low back pain to improve QOL. Baseline: 10/10 worst Goal status: INITIAL   LONG TERM GOALS: Target date: 10/11/2024   Patient will be independent with ongoing/advanced HEP for self-management at home.  Baseline: no advanced HEP yet Goal status: INITIAL  2.  Patient will report 50-75% improvement in low back pain to improve QOL.  Baseline: 10/10 worst Goal status: INITIAL  3.  Patient to demonstrate ability to achieve and maintain good spinal alignment/posturing and body mechanics needed for daily activities. Baseline: significant lumbar lordosis Goal status: INITIAL  4.  Patient will demonstrate full pain free lumbar ROM to perform ADLs.   Baseline: Refer to above lumbar ROM table Goal status: INITIAL  5.  Patient will demonstrate improved BLE strength to >/= 5/5 for improved stability and ease of mobility. Baseline: Refer to above LE MMT table Goal status: INITIAL  6. Patient will report </= 30% on Modified Oswestry (MCID = 12%) to demonstrate improved functional ability with decreased pain interference. Baseline: 44% Goal status: INITIAL   PLAN:  PT FREQUENCY: 1-2x/week  PT DURATION: 8 weeks  PLANNED INTERVENTIONS: 97164- PT Re-evaluation, 97110-Therapeutic exercises, 97530- Therapeutic activity, [id number]- Neuromuscular re-education, 97535- Self Care, [id number]- Manual therapy, [id number]- Electrical stimulation (unattended), 97016- Vasopneumatic device, [id number]- Ultrasound, [id number]- Traction (mechanical), [id number]- Ionotophoresis 4mg /ml Dexamethasone , 79439 (1-2 muscles), 20561 (3+ muscles)- Dry Needling, Patient/Family education, Taping, Joint mobilization, Spinal mobilization, Cryotherapy, and Moist heat  PLAN  FOR NEXT SESSION: progress with lumbar stabilization and strengthening;  PPT exercise   Tj Kitchings, PT 08/16/2024, 8:37 PM

## 2024-08-15 ENCOUNTER — Other Ambulatory Visit: Payer: Self-pay

## 2024-08-15 ENCOUNTER — Ambulatory Visit: Attending: Student | Admitting: Rehabilitation

## 2024-08-15 ENCOUNTER — Encounter: Payer: Self-pay | Admitting: Radiology

## 2024-08-15 ENCOUNTER — Encounter: Payer: Self-pay | Admitting: Rehabilitation

## 2024-08-15 DIAGNOSIS — M5442 Lumbago with sciatica, left side: Secondary | ICD-10-CM | POA: Insufficient documentation

## 2024-08-15 DIAGNOSIS — M5459 Other low back pain: Secondary | ICD-10-CM | POA: Diagnosis not present

## 2024-08-15 DIAGNOSIS — M5432 Sciatica, left side: Secondary | ICD-10-CM | POA: Insufficient documentation

## 2024-08-15 DIAGNOSIS — R262 Difficulty in walking, not elsewhere classified: Secondary | ICD-10-CM | POA: Diagnosis not present

## 2024-08-15 DIAGNOSIS — R293 Abnormal posture: Secondary | ICD-10-CM | POA: Diagnosis not present

## 2024-08-15 DIAGNOSIS — G8929 Other chronic pain: Secondary | ICD-10-CM | POA: Diagnosis not present

## 2024-08-18 ENCOUNTER — Ambulatory Visit

## 2024-08-18 DIAGNOSIS — R262 Difficulty in walking, not elsewhere classified: Secondary | ICD-10-CM | POA: Diagnosis not present

## 2024-08-18 DIAGNOSIS — M5459 Other low back pain: Secondary | ICD-10-CM

## 2024-08-18 DIAGNOSIS — G8929 Other chronic pain: Secondary | ICD-10-CM | POA: Diagnosis not present

## 2024-08-18 DIAGNOSIS — M5442 Lumbago with sciatica, left side: Secondary | ICD-10-CM | POA: Diagnosis not present

## 2024-08-18 DIAGNOSIS — R293 Abnormal posture: Secondary | ICD-10-CM

## 2024-08-18 DIAGNOSIS — M5432 Sciatica, left side: Secondary | ICD-10-CM | POA: Diagnosis not present

## 2024-08-18 NOTE — Therapy (Signed)
 OUTPATIENT PHYSICAL THERAPY THORACOLUMBAR TREATMENT   Patient Name: Angel Dawson MRN: [id number] DOB:[birth year], 43 y.o., female Today's Date: 08/18/2024  END OF SESSION:  PT End of Session - 08/18/24 0813     Visit Number 2    Date for Recertification  10/10/24    PT Start Time 0806   pt late   PT Stop Time 0845    PT Time Calculation (min) 39 min    Activity Tolerance Patient tolerated treatment well;No increased pain    Behavior During Therapy [facility] for tasks assessed/performed           Past Medical History:  Diagnosis Date   Abdominal pain    related to gallstones   Achilles tendonitis, bilateral    Back pain    Bronchitis    02/17   Chest pain    Constipation    Diabetes (HCC)    Type II   DKA (diabetic ketoacidoses) 08/25/2014   Gallstones    GERD (gastroesophageal reflux disease)    Headache    migraines   Hypertension    Joint pain    Lattice degeneration, both eyes    Palpitations    Pancreatitis    PCOS (polycystic ovarian syndrome)    Sinus congestion    occ. uses OTC meds for this   Sleep apnea    USES CPAP   Vitamin D  deficiency    Past Surgical History:  Procedure Laterality Date   CHOLECYSTECTOMY N/A 06/22/2013   Procedure: LAPAROSCOPIC CHOLECYSTECTOMY ;  Surgeon: Elon CHRISTELLA Pacini, MD;  Location: [facility];  Service: General;  Laterality: N/A;   DILITATION & CURRETTAGE/HYSTROSCOPY WITH HYDROTHERMAL ABLATION N/A 01/03/2016   Procedure: DILATATION & CURETTAGE/HYSTEROSCOPY WITH HYDROTHERMAL ABLATION;  Surgeon: [name], MD;  Location: [facility];  Service: Gynecology;  Laterality: N/A;  HTA rep will be attend confirmed by office 12/12/15    NASAL SEPTOPLASTY W/ TURBINOPLASTY Bilateral 01/11/2021   Procedure: NASAL SEPTOPLASTY WITH TURBINATE REDUCTION;  Surgeon: [name], MD;  Location: [facility] OR;  Service: ENT;  Laterality: Bilateral;   TONSILLECTOMY     Patient Active Problem List   Diagnosis Date Noted   Menorrhagia 01/20/2023   Insulin   resistance 12/01/2022   Other fatigue 11/13/2022   SOBOE (shortness of breath on exertion) 11/13/2022   Other chronic pain 11/13/2022   Other hyperlipidemia 11/13/2022   Depression 11/13/2022   Type 2 diabetes mellitus with other specified complication (HCC) 08/19/2022   Essential hypertension 08/19/2022   Tinnitus of both ears 12/05/2021   Deviated septum 01/11/2021   Nasal turbinate hypertrophy 04/08/2018   Obstructive sleep apnea treated with continuous positive airway pressure (CPAP) 12/29/2017   Onychomycosis 10/26/2014   Gastroesophageal reflux disease without esophagitis 09/28/2014   morbid obesity with starting BMI 55 08/25/2014   Chronic rhinitis 06/30/2014   Pancreatitis, acute 07/02/2013   Status post laparoscopic cholecystectomy 06/24/2013   Gallstones 04/13/2013   Low back pain 08/09/2012   Vitamin D  deficiency 08/09/2012    PCP: [name], [name], MD   REFERRING PROVIDER: Emiliano Leonce CROME, PA*   REFERRING DIAG: [phone] (ICD-10-CM) - Chronic bilateral low back pain with left-sided sciatica  THERAPY DIAG:  Other low back pain  Sciatica, left side  Abnormal posture  Difficulty in walking, not elsewhere classified  RATIONALE FOR EVALUATION AND TREATMENT: Rehabilitation  ONSET DATE:   NEXT MD VISIT:    SUBJECTIVE:  SUBJECTIVE STATEMENT: Pt reports she is stiff in the am, had an electric jolt this morning   43 y/o patient referred to PT from orthopedics for acute on chronic LBP w/ L sciatica.   She reports that she has a h/o L lower back pain with radiation into the L hip and thigh.  This started 18-20 yrs ago when she fell on her back in the bathtub.   However, 2-3 weeks ago she started having sharp sudden intense B back pain/spasm that felt like an  electric jolt that would hit her for 5-10 seconds many times daily and then be gone.   She denies any trauma such as falling or any changes in activity level with lifting/travel/etc.   The common factor sees to be the pain occurs when she is in a standing position.   The pain doesn't occur when she is sitting or lying down.   Sleep is not disrupted.  Going up steps increases her general sore back pain.  She has to lead with the RLE going up steps due to LLE weaknes.    Sit to stand transfers can increase the pain.     PAIN: Are you having pain? Yes: NPRS scale: 5/10 now;  10/10 sudden/quick pain Pain location: low back Pain description: sharp and stabbing for 10-20 sec;  achy and radiating other time Aggravating factors: standing with back extension seems to precipitate Relieving factors: sitting/lying  PERTINENT HISTORY:  NIDDM 2, HTN, h/o pancreatitis, palpitations, B Achilles tendonitis  PRECAUTIONS: None  RED FLAGS: None  WEIGHT BEARING RESTRICTIONS: No  FALLS:  Has patient fallen in last 6 months? No  LIVING ENVIRONMENT: Lives with: lives alone Lives in: House/apartment Stairs: Yes: External: 3 steps; none Has following equipment at home: None  OCCUPATION: administrative desk work 7 hrs daily  PLOF: Independent  PATIENT GOALS: not hurt in the back   OBJECTIVE: (objective measures completed at initial evaluation unless otherwise dated)  DIAGNOSTIC FINDINGS:  EXAM: LUMBAR SPINE - COMPLETE 4+ VIEW   COMPARISON:  07/29/2021   FINDINGS: Five non-rib-bearing lumbar vertebra. Slight dextroscoliotic curvature at L4-L5. 8 mm grade 1 anterolisthesis of L4 on L5. No change in alignment on flexion or extension. Mild L4-L5 disc space narrowing and spurring. Mild multilevel facet hypertrophy. No evidence of pars defects or focal bone abnormalities right upper quadrant surgical clips again seen.   IMPRESSION: 1. Grade 1 anterolisthesis of L4 on L5. No change in alignment  on flexion or extension. 2. Mild L4-L5 degenerative disc disease. Mild multilevel facet hypertrophy.     Electronically Signed   By: Andrea Gasman M.D.   On: 07/25/2024 16:57EXAM: PELVIS - 1-2 VIEW   COMPARISON:  None Available.   FINDINGS: The hip joint spaces are preserved. Both femoral heads are well seated in the respective acetabula. No fracture. No evidence of erosion or avascular necrosis. The pubic symphysis and sacroiliac joints are congruent. Left pelvic phleboliths.   IMPRESSION: Negative radiograph of the pelvis.     Electronically Signed   By: Andrea Gasman M.D.   On: 07/25/2024 16:58  PATIENT SURVEYS:  Modified Oswestry:  MODIFIED OSWESTRY DISABILITY SCALE  Date: 08/18/2024 Score  Pain intensity 4 =  Pain medication provides me with little relief from pain.  2. Personal care (washing, dressing, etc.) 2 =  It is painful to take care of myself, and I am slow and careful.  3. Lifting 1 = I can lift heavy weights, but it causes increased pain.  4. Walking  2 =  Pain prevents me from walking more than  mile.  5. Sitting 1 =  I can only sit in my favorite chair as long as I like.  6. Standing 3 =  Pain prevents me from standing more than 1/2 hour.  7. Sleeping 2 =  Even when I take pain medication, I sleep less than 6 hours  8. Social Life 3 =  Pain prevents me from going out very often.  9. Traveling 3 = My pain restricts my travel over 1 hour  10. Employment/ Homemaking 1 = My normal homemaking/job activities increase my pain, but I can still perform all that is required of me  Total 22/50   Interpretation of scores: Score Category Description  0-20% Minimal Disability The patient can cope with most living activities. Usually no treatment is indicated apart from advice on lifting, sitting and exercise  21-40% Moderate Disability The patient experiences more pain and difficulty with sitting, lifting and standing. Travel and social life are more difficult  and they may be disabled from work. Personal care, sexual activity and sleeping are not grossly affected, and the patient can usually be managed by conservative means  41-60% Severe Disability Pain remains the main problem in this group, but activities of daily living are affected. These patients require a detailed investigation  61-80% Crippled Back pain impinges on all aspects of the patient's life. Positive intervention is required  81-100% Bed-bound These patients are either bed-bound or exaggerating their symptoms  Bluford FORBES Zoe DELENA Karon DELENA, et al. Surgery versus conservative management of stable thoracolumbar fracture: the PRESTO feasibility RCT. [place] ([place]): [employer]; 2021 Nov. [facility] Technology Assessment, No. 25.62.) Appendix 3, Oswestry Disability Index category descriptors. Available from: Findjewelers.cz  Minimally Clinically Important Difference (MCID) = 12.8%  SCREENING FOR RED FLAGS: Bowel or bladder incontinence: No Spinal tumors: No Cauda equina syndrome: No Compression fracture: No Abdominal aneurysm: No  COGNITION:  Overall cognitive status: Within functional limits for tasks assessed    SENSATION: WFL  POSTURE:  increased lumbar lordosis  LLE is longer in supine at the malleolar level  PALPATION: TTP over B lumbar paraspinals and L SI joint;   P/A lumbar vertebral glides are hypermobile feeling  LUMBAR ROM:   Active  Eval  Flexion Just below knees; no pain  Extension 100% ROM;  end range reproduces her sharp/brief intense pain  Right lateral flexion Below knee  Left lateral flexion Below knee  Right rotation 100%  Left rotation 100%  (Blank rows = not tested)  MUSCLE LENGTH: Hamstrings: Right SLR = 85 deg; Left SLR = 90+ deg Thomas test: Right  deg; Left  deg Hamstrings: not tight ITB: not tight Piriformis: moderately tight L and less so R Hip flexors: not tight Quads: not tight Heelcord:  NT  LOWER EXTREMITY ROM:     Active  Right eval Left eval  Hip flexion    Hip extension    Hip abduction    Hip adduction    Hip internal rotation    Hip external rotation    Knee flexion    Knee extension    Ankle dorsiflexion    Ankle plantarflexion    Ankle inversion    Ankle eversion    (Blank rows = not tested)  LOWER EXTREMITY MMT:    MMT Right eval Left eval  Hip flexion    Hip extension    Hip abduction    Hip adduction    Hip internal  rotation    Hip external rotation    Knee flexion    Knee extension    Ankle dorsiflexion    Ankle plantarflexion    Ankle inversion    Ankle eversion     (Blank rows = not tested)  LUMBAR SPECIAL TESTS:  Straight leg raise test: Negative, Slump test: Negative, SI Compression/distraction test: Negative, FABER test: Positive, and Gaenslen's test: Positive on the LLE Long sit test is long to short on the RLE indicating an anteriorly rotated inominate on the RLE vs. Posterior on LLE  FUNCTIONAL TESTS:  TBD  GAIT: Distance walked: into clinic Assistive device utilized: None Level of assistance: Complete Independence Gait pattern: supinates on the R, pronates on the LLE;  sway backed posture Comments:    TODAY'S TREATMENT:  08/18/24 NEUROMUSCULAR RE-EDUCATION: To improve coordination, kinesthesia, posture, and proprioception.  Supine PPT 10x3' Bridges with PPT 2x10 Supine curl ups 2x10; diagonal curl ups 2x10 Seated jefferson curl blue wt ball 2 x 10 Standing jefferson curl blue wt ball x 10 Standing lat pull + march RTB x 10  THERAPEUTIC EXERCISE: To improve strength, endurance, ROM, and flexibility.  Supine piriformis stretch opp leg straight 2 x 1 min B Quadruped cat 10x3' Seated lumbar flex stretch green pball x63min  08/15/24 SELF CARE: Provided education on PT POC progression; initial HEP;  postural correction w/ PPT throughout the day;  avoiding lumbar extension   PATIENT EDUCATION:  Education details: PT  eval findings, anticipated POC, and initial HEP  Person educated: Patient Education method: Explanation, Verbal cues, Tactile cues, Handouts, and MedBridgeGO app access provided Education comprehension: verbalized understanding, verbal cues required, tactile cues required, and needs further education  HOME EXERCISE PROGRAM: Access Code: [email or web address] URL: [email or web address] Date: 08/18/2024 Prepared by: Velisa Regnier  Exercises - Supine Posterior Pelvic Tilt  - 1 x daily - 7 x weekly - 3 sets - 10 reps - Quadruped Cat with Posterior Pelvic Tilt  - 1 x daily - 7 x weekly - 3 sets - 10 reps - Curl Up with Reach  - 1 x daily - 7 x weekly - 3 sets - 10 reps - Diagonal Curl Up with Reach  - 1 x daily - 7 x weekly - 3 sets - 10 reps - Supine Piriformis Stretch with Leg Straight  - 1 x daily - 7 x weekly - 1 sets - 2 reps - 1 min hold - Seated Flexion Stretch with Swiss Ball  - 1 x daily - 7 x weekly - 1 sets - 3 reps - 1 min hold   ASSESSMENT:  CLINICAL IMPRESSION: Pt shows good understanding of HEP, only correction needed with pelvic tilts. Progressed with core stabilization exercises focusing on PPT and avoiding excessive lordosis. Pt responded well w/o any increase in pain, just fatigued from the curl ups mostly.     Angel Dawson is a 43 y.o. female who was referred to physical therapy for evaluation and treatment for acute on chronic LBP w/ sciatica.   Patient reports onset of LBP pain beginning 2-3 weeks ago for no apparent reason. She has some chronic L low back pain with sciatica into the LLE, but reports this is normally just soreness/achiness.    The pain she is getting now is sudden, sharp, severe and only last 5-10 seconds, but it nearly brings her to her knees.    Pain is worse with with standing activities.   She does not normally get the pain with sitting or  lying down.  Her posture is significantly accentuated lumbar lordosis and she reproduces the sharp pain with  end range lumbar extension.   She also has piriformis tightness, hip weakness, and tenderness to palpate. Her LLE is a little longer than the RLE.   She is generally hyperflexible and hypermobile.   MRI shows some spondylolisthesis of L4 on L5 with DDD changes.    Patient has deficits in  hip flexibility, BLE strength, abnormal posture, and TTP with pain in the low back and hips which are interfering with ADLs and are impacting quality of life.  On Modified Oswestry patient scored 22/50 demonstrating 44% or moderate disability.  Aala will benefit from skilled PT to address above deficits to improve mobility and activity tolerance with decreased pain interference.     OBJECTIVE IMPAIRMENTS: difficulty walking, decreased strength, postural dysfunction, and pain.   ACTIVITY LIMITATIONS: carrying, standing, stairs, and locomotion level  PARTICIPATION LIMITATIONS: meal prep, cleaning, laundry, shopping, and community activity  PERSONAL FACTORS: Fitness, Time since onset of injury/illness/exacerbation, and 1-2 comorbidities: NIDDM 2, HTN, h/o pancreatitis, palpitations are also affecting patient's functional outcome.   REHAB POTENTIAL: Good  CLINICAL DECISION MAKING: Evolving/moderate complexity  EVALUATION COMPLEXITY: Moderate   GOALS: Goals reviewed with patient? Yes  SHORT TERM GOALS: Target date: 09/12/2024    Patient will be independent with initial HEP to improve outcomes and carryover.  Baseline: 100% PT assist required for correct completion Goal status: IN PROGRESS- 08/18/24 mostly met  2.  Patient will report 25% improvement in low back pain to improve QOL. Baseline: 10/10 worst Goal status: INITIAL   LONG TERM GOALS: Target date: 10/11/2024   Patient will be independent with ongoing/advanced HEP for self-management at home.  Baseline: no advanced HEP yet Goal status: INITIAL  2.  Patient will report 50-75% improvement in low back pain to improve QOL.  Baseline: 10/10  worst Goal status: INITIAL  3.  Patient to demonstrate ability to achieve and maintain good spinal alignment/posturing and body mechanics needed for daily activities. Baseline: significant lumbar lordosis Goal status: INITIAL  4.  Patient will demonstrate full pain free lumbar ROM to perform ADLs.   Baseline: Refer to above lumbar ROM table Goal status: INITIAL  5.  Patient will demonstrate improved BLE strength to >/= 5/5 for improved stability and ease of mobility. Baseline: Refer to above LE MMT table Goal status: INITIAL  6. Patient will report </= 30% on Modified Oswestry (MCID = 12%) to demonstrate improved functional ability with decreased pain interference. Baseline: 44% Goal status: INITIAL   PLAN:  PT FREQUENCY: 1-2x/week  PT DURATION: 8 weeks  PLANNED INTERVENTIONS: 97164- PT Re-evaluation, 97110-Therapeutic exercises, 97530- Therapeutic activity, 97112- Neuromuscular re-education, 97535- Self Care, 02859- Manual therapy, [id number]- Electrical stimulation (unattended), 97016- Vasopneumatic device, [id number]- Ultrasound, [id number]- Traction (mechanical), [id number]- Ionotophoresis 4mg /ml Dexamethasone , 79439 (1-2 muscles), 20561 (3+ muscles)- Dry Needling, Patient/Family education, Taping, Joint mobilization, Spinal mobilization, Cryotherapy, and Moist heat  PLAN FOR NEXT SESSION: progress with lumbar stabilization and strengthening;  PPT exercise   Sol LITTIE Gaskins, PTA 08/18/2024, 8:51 AM

## 2024-08-24 ENCOUNTER — Telehealth: Payer: Self-pay | Admitting: Gastroenterology

## 2024-08-24 NOTE — Telephone Encounter (Addendum)
 Procedure:Colonoscopy Procedure date: 09/01/24 Procedure location: [facility] Arrival Time: 7:00 am Spoke with the patient Y/N:   No, I left a detailed message on [phone] on 08/24/24 @ 11:21 am for the patient to return call   Yes 08/25/24 @ 10:32  Any prep concerns? No Has the patient obtained the prep from the pharmacy ? Yes Do you have a care partner and transportation: Yes Any additional concerns? No

## 2024-08-25 ENCOUNTER — Encounter (HOSPITAL_COMMUNITY): Payer: Self-pay | Admitting: Gastroenterology

## 2024-08-25 ENCOUNTER — Ambulatory Visit: Admitting: Family Medicine

## 2024-08-25 NOTE — Progress Notes (Signed)
 Attempted to obtain medical history for pre op call via telephone, unable to reach at this time. HIPAA compliant voicemail message left requesting return call to pre surgical testing department.

## 2024-08-29 ENCOUNTER — Ambulatory Visit: Admitting: Rehabilitation

## 2024-08-29 ENCOUNTER — Other Ambulatory Visit: Payer: Self-pay | Admitting: Medical Genetics

## 2024-08-29 ENCOUNTER — Encounter: Payer: Self-pay | Admitting: Rehabilitation

## 2024-08-29 ENCOUNTER — Encounter: Payer: Self-pay | Admitting: Gastroenterology

## 2024-08-29 DIAGNOSIS — M5442 Lumbago with sciatica, left side: Secondary | ICD-10-CM | POA: Diagnosis not present

## 2024-08-29 DIAGNOSIS — R262 Difficulty in walking, not elsewhere classified: Secondary | ICD-10-CM

## 2024-08-29 DIAGNOSIS — R293 Abnormal posture: Secondary | ICD-10-CM

## 2024-08-29 DIAGNOSIS — M5459 Other low back pain: Secondary | ICD-10-CM

## 2024-08-29 DIAGNOSIS — M5432 Sciatica, left side: Secondary | ICD-10-CM

## 2024-08-29 DIAGNOSIS — G8929 Other chronic pain: Secondary | ICD-10-CM | POA: Diagnosis not present

## 2024-08-29 NOTE — Therapy (Signed)
 OUTPATIENT PHYSICAL THERAPY THORACOLUMBAR TREATMENT   Patient Name: Angel Dawson MRN: [id number] DOB:13-Feb-1981, 43 y.o., female Today's Date: 08/29/2024  END OF SESSION:  PT End of Session - 08/29/24 0855     Visit Number 3    Date for Recertification  10/10/24    PT Start Time 0853    PT Stop Time 0950    PT Time Calculation (min) 57 min    Activity Tolerance Patient tolerated treatment well;No increased pain    Behavior During Therapy [facility] for tasks assessed/performed           Past Medical History:  Diagnosis Date   Abdominal pain    related to gallstones   Achilles tendonitis, bilateral    Back pain    Bronchitis    02/17   Chest pain    Constipation    Diabetes (HCC)    Type II   DKA (diabetic ketoacidoses) 08/25/2014   Gallstones    GERD (gastroesophageal reflux disease)    Headache    migraines   Hypertension    Joint pain    Lattice degeneration, both eyes    Palpitations    Pancreatitis    PCOS (polycystic ovarian syndrome)    Sinus congestion    occ. uses OTC meds for this   Sleep apnea    USES CPAP   Vitamin D  deficiency    Past Surgical History:  Procedure Laterality Date   CHOLECYSTECTOMY N/A 06/22/2013   Procedure: LAPAROSCOPIC CHOLECYSTECTOMY ;  Surgeon: Elon CHRISTELLA Pacini, MD;  Location: [facility];  Service: General;  Laterality: N/A;   DILITATION & CURRETTAGE/HYSTROSCOPY WITH HYDROTHERMAL ABLATION N/A 01/03/2016   Procedure: DILATATION & CURETTAGE/HYSTEROSCOPY WITH HYDROTHERMAL ABLATION;  Surgeon: [name], MD;  Location: [facility];  Service: Gynecology;  Laterality: N/A;  HTA rep will be attend confirmed by office 12/12/15    NASAL SEPTOPLASTY W/ TURBINOPLASTY Bilateral 01/11/2021   Procedure: NASAL SEPTOPLASTY WITH TURBINATE REDUCTION;  Surgeon: [name], MD;  Location: [facility] OR;  Service: ENT;  Laterality: Bilateral;   TONSILLECTOMY     Patient Active Problem List   Diagnosis Date Noted   Menorrhagia 01/20/2023   Insulin   resistance 12/01/2022   Other fatigue 11/13/2022   SOBOE (shortness of breath on exertion) 11/13/2022   Other chronic pain 11/13/2022   Other hyperlipidemia 11/13/2022   Depression 11/13/2022   Type 2 diabetes mellitus with other specified complication (HCC) 08/19/2022   Essential hypertension 08/19/2022   Tinnitus of both ears 12/05/2021   Deviated septum 01/11/2021   Nasal turbinate hypertrophy 04/08/2018   Obstructive sleep apnea treated with continuous positive airway pressure (CPAP) 12/29/2017   Onychomycosis 10/26/2014   Gastroesophageal reflux disease without esophagitis 09/28/2014   morbid obesity with starting BMI 55 08/25/2014   Chronic rhinitis 06/30/2014   Pancreatitis, acute 07/02/2013   Status post laparoscopic cholecystectomy 06/24/2013   Gallstones 04/13/2013   Low back pain 08/09/2012   Vitamin D  deficiency 08/09/2012    PCP: [name], [name], MD   REFERRING PROVIDER: Emiliano Leonce CROME, PA*   REFERRING DIAG: [phone] (ICD-10-CM) - Chronic bilateral low back pain with left-sided sciatica  THERAPY DIAG:  Other low back pain  Sciatica, left side  Abnormal posture  Difficulty in walking, not elsewhere classified  RATIONALE FOR EVALUATION AND TREATMENT: Rehabilitation  ONSET DATE:   NEXT MD VISIT:    SUBJECTIVE:  SUBJECTIVE STATEMENT: Patient reports traveled to [place]  last week and the flight caused increased back pain.   States did a lot of walking as well which bothered her back some.  Rates pain today 6.5/10  43 y/o patient referred to PT from orthopedics for acute on chronic LBP w/ L sciatica.   She reports that she has a h/o L lower back pain with radiation into the L hip and thigh.  This started 18-20 yrs ago when she fell on her back in  the bathtub.   However, 2-3 weeks ago she started having sharp sudden intense B back pain/spasm that felt like an electric jolt that would hit her for 5-10 seconds many times daily and then be gone.   She denies any trauma such as falling or any changes in activity level with lifting/travel/etc.   The common factor sees to be the pain occurs when she is in a standing position.   The pain doesn't occur when she is sitting or lying down.   Sleep is not disrupted.  Going up steps increases her general sore back pain.  She has to lead with the RLE going up steps due to LLE weaknes.    Sit to stand transfers can increase the pain.     PAIN: Are you having pain? Yes: NPRS scale: 5/10 now;  10/10 sudden/quick pain Pain location: low back Pain description: sharp and stabbing for 10-20 sec;  achy and radiating other time Aggravating factors: standing with back extension seems to precipitate Relieving factors: sitting/lying  PERTINENT HISTORY:  NIDDM 2, HTN, h/o pancreatitis, palpitations, B Achilles tendonitis  PRECAUTIONS: None  RED FLAGS: None  WEIGHT BEARING RESTRICTIONS: No  FALLS:  Has patient fallen in last 6 months? No  LIVING ENVIRONMENT: Lives with: lives alone Lives in: House/apartment Stairs: Yes: External: 3 steps; none Has following equipment at home: None  OCCUPATION: administrative desk work 7 hrs daily  PLOF: Independent  PATIENT GOALS: not hurt in the back   OBJECTIVE: (objective measures completed at initial evaluation unless otherwise dated)  DIAGNOSTIC FINDINGS:  EXAM: LUMBAR SPINE - COMPLETE 4+ VIEW   COMPARISON:  07/29/2021   FINDINGS: Five non-rib-bearing lumbar vertebra. Slight dextroscoliotic curvature at L4-L5. 8 mm grade 1 anterolisthesis of L4 on L5. No change in alignment on flexion or extension. Mild L4-L5 disc space narrowing and spurring. Mild multilevel facet hypertrophy. No evidence of pars defects or focal bone abnormalities right  upper quadrant surgical clips again seen.   IMPRESSION: 1. Grade 1 anterolisthesis of L4 on L5. No change in alignment on flexion or extension. 2. Mild L4-L5 degenerative disc disease. Mild multilevel facet hypertrophy.     Electronically Signed   By: Andrea Gasman M.D.   On: 07/25/2024 16:57EXAM: PELVIS - 1-2 VIEW   COMPARISON:  None Available.   FINDINGS: The hip joint spaces are preserved. Both femoral heads are well seated in the respective acetabula. No fracture. No evidence of erosion or avascular necrosis. The pubic symphysis and sacroiliac joints are congruent. Left pelvic phleboliths.   IMPRESSION: Negative radiograph of the pelvis.     Electronically Signed   By: Andrea Gasman M.D.   On: 07/25/2024 16:58  PATIENT SURVEYS:  Modified Oswestry:  MODIFIED OSWESTRY DISABILITY SCALE  Date: 08/29/2024 Score  Pain intensity 4 =  Pain medication provides me with little relief from pain.  2. Personal care (washing, dressing, etc.) 2 =  It is painful to take care of myself, and I am slow and  careful.  3. Lifting 1 = I can lift heavy weights, but it causes increased pain.  4. Walking 2 =  Pain prevents me from walking more than  mile.  5. Sitting 1 =  I can only sit in my favorite chair as long as I like.  6. Standing 3 =  Pain prevents me from standing more than 1/2 hour.  7. Sleeping 2 =  Even when I take pain medication, I sleep less than 6 hours  8. Social Life 3 =  Pain prevents me from going out very often.  9. Traveling 3 = My pain restricts my travel over 1 hour  10. Employment/ Homemaking 1 = My normal homemaking/job activities increase my pain, but I can still perform all that is required of me  Total 22/50   Interpretation of scores: Score Category Description  0-20% Minimal Disability The patient can cope with most living activities. Usually no treatment is indicated apart from advice on lifting, sitting and exercise  21-40% Moderate Disability The  patient experiences more pain and difficulty with sitting, lifting and standing. Travel and social life are more difficult and they may be disabled from work. Personal care, sexual activity and sleeping are not grossly affected, and the patient can usually be managed by conservative means  41-60% Severe Disability Pain remains the main problem in this group, but activities of daily living are affected. These patients require a detailed investigation  61-80% Crippled Back pain impinges on all aspects of the patient's life. Positive intervention is required  81-100% Bed-bound These patients are either bed-bound or exaggerating their symptoms  Bluford FORBES Zoe DELENA Karon DELENA, et al. Surgery versus conservative management of stable thoracolumbar fracture: the PRESTO feasibility RCT. [place] ([place]): [employer]; 2021 Nov. [facility] Technology Assessment, No. 25.62.) Appendix 3, Oswestry Disability Index category descriptors. Available from: [email or web address]  Minimally Clinically Important Difference (MCID) = 12.8%  SCREENING FOR RED FLAGS: Bowel or bladder incontinence: No Spinal tumors: No Cauda equina syndrome: No Compression fracture: No Abdominal aneurysm: No  COGNITION:  Overall cognitive status: Within functional limits for tasks assessed    SENSATION: WFL  POSTURE:  increased lumbar lordosis  LLE is longer in supine at the malleolar level  PALPATION: TTP over B lumbar paraspinals and L SI joint;   P/A lumbar vertebral glides are hypermobile feeling  LUMBAR ROM:   Active  Eval  Flexion Just below knees; no pain  Extension 100% ROM;  end range reproduces her sharp/brief intense pain  Right lateral flexion Below knee  Left lateral flexion Below knee  Right rotation 100%  Left rotation 100%  (Blank rows = not tested)  MUSCLE LENGTH: Hamstrings: Right SLR = 85 deg; Left SLR = 90+ deg Thomas test: Right  deg; Left  deg Hamstrings: not  tight ITB: not tight Piriformis: moderately tight L and less so R Hip flexors: not tight Quads: not tight Heelcord: NT  LOWER EXTREMITY ROM:     Active  Right eval Left eval  Hip flexion    Hip extension    Hip abduction    Hip adduction    Hip internal rotation    Hip external rotation    Knee flexion    Knee extension    Ankle dorsiflexion    Ankle plantarflexion    Ankle inversion    Ankle eversion    (Blank rows = not tested)  LOWER EXTREMITY MMT:    MMT Right eval Left eval  Hip flexion  Hip extension    Hip abduction    Hip adduction    Hip internal rotation    Hip external rotation    Knee flexion    Knee extension    Ankle dorsiflexion    Ankle plantarflexion    Ankle inversion    Ankle eversion     (Blank rows = not tested)  LUMBAR SPECIAL TESTS:  Straight leg raise test: Negative, Slump test: Negative, SI Compression/distraction test: Negative, FABER test: Positive, and Gaenslen's test: Positive on the LLE Long sit test is long to short on the RLE indicating an anteriorly rotated inominate on the RLE vs. Posterior on LLE  FUNCTIONAL TESTS:  TBD  GAIT: Distance walked: into clinic Assistive device utilized: None Level of assistance: Complete Independence Gait pattern: supinates on the R, pronates on the LLE;  sway backed posture Comments:    TODAY'S TREATMENT:  08/29/24 THERAPEUTIC EXERCISE: To improve strength and endurance.  Demonstration, verbal and tactile cues throughout for technique. NuStep L4 x 7'  THERAPEUTIC ACTIVITIES: To improve functional performance.  Demonstration, verbal and tactile cues throughout for technique. Seated RDL w/ 15# from mat table x 20 Seated 65 cm swiss ball rollouts 11:00, 12:00, 1:00 x 20 each direction  NEUROMUSCULAR RE-EDUCATION: To improve balance, coordination, kinesthesia, posture, and proprioception. Seated blue TB shoulder ext w/ hip flex x 2/10 BUE/BLE Attempted blue TB PNF extension/chop but  increased LBP Seated palloff press Blue TB doubled x 2/10 MFI walkouts blue TB doubled w/ PPT x 2/10 each direction  MODALITIES: IFC to L low back and L posterior hip      08/18/24 NEUROMUSCULAR RE-EDUCATION: To improve coordination, kinesthesia, posture, and proprioception.  Supine PPT 10x3' Bridges with PPT 2x10 Supine curl ups 2x10; diagonal curl ups 2x10 Seated jefferson curl blue wt ball 2 x 10 Standing jefferson curl blue wt ball x 10 Standing lat pull + march RTB x 10  THERAPEUTIC EXERCISE: To improve strength, endurance, ROM, and flexibility.  Supine piriformis stretch opp leg straight 2 x 1 min B Quadruped cat 10x3' Seated lumbar flex stretch green pball x48min  08/15/24 SELF CARE: Provided education on PT POC progression; initial HEP;  postural correction w/ PPT throughout the day;  avoiding lumbar extension   PATIENT EDUCATION:  Education details: PT eval findings, anticipated POC, and initial HEP  Person educated: Patient Education method: Explanation, Verbal cues, Tactile cues, Handouts, and MedBridgeGO app access provided Education comprehension: verbalized understanding, verbal cues required, tactile cues required, and needs further education  HOME EXERCISE PROGRAM: Access Code: [email or web address] URL: [email or web address] Date: 08/29/2024 Prepared by: Garnette Montclair  Exercises - Supine Posterior Pelvic Tilt  - 1 x daily - 7 x weekly - 3 sets - 10 reps - Quadruped Cat with Posterior Pelvic Tilt  - 1 x daily - 7 x weekly - 3 sets - 10 reps - Curl Up with Reach  - 1 x daily - 7 x weekly - 3 sets - 10 reps - Diagonal Curl Up with Reach  - 1 x daily - 7 x weekly - 3 sets - 10 reps - Supine Piriformis Stretch with Leg Straight  - 1 x daily - 7 x weekly - 1 sets - 2 reps - 1 min hold - Seated Flexion Stretch with Swiss Ball  - 1 x daily - 7 x weekly - 1 sets - 3 reps - 1 min hold - Seated Anti-Rotation Press With Anchored Resistance  - 1 x daily - 7 x weekly  -  3 sets - 10 reps - Anti-Rotation Sidestepping with Resistance  - 1 x daily - 7 x weekly - 3 sets - 10 reps  ASSESSMENT:  CLINICAL IMPRESSION: Patient is progressing to goals.   Pain is decreasing but still present.   She reports she is getting sharp, quick pain with certain movements.  Also reports more dull, achy, stiffness  in the L low back and hip.   She is tender to touch over the L quadratus, L SI, and L buttock so added electrical stimulation today for pain relief.  Progressed her HEP with stabilization with PPT activities.   Has difficulty with PPT maintenance with exercises and gets a little anterior hip discomfort with standing PPT.   Difficult doing hip flexion with Tband pulldowns as well due to L hip weakness and tendency to externally rotate.   PT remains necessary for core strength, stability of lumbar spine and pain.   Continue per POC  EVAL: Angel Dawson is a 43 y.o. female who was referred to physical therapy for evaluation and treatment for acute on chronic LBP w/ sciatica.   Patient reports onset of LBP pain beginning 2-3 weeks ago for no apparent reason. She has some chronic L low back pain with sciatica into the LLE, but reports this is normally just soreness/achiness.    The pain she is getting now is sudden, sharp, severe and only last 5-10 seconds, but it nearly brings her to her knees.    Pain is worse with with standing activities.   She does not normally get the pain with sitting or lying down.  Her posture is significantly accentuated lumbar lordosis and she reproduces the sharp pain with end range lumbar extension.   She also has piriformis tightness, hip weakness, and tenderness to palpate. Her LLE is a little longer than the RLE.   She is generally hyperflexible and hypermobile.   MRI shows some spondylolisthesis of L4 on L5 with DDD changes.    Patient has deficits in  hip flexibility, BLE strength, abnormal posture, and TTP with pain in the low back and hips which  are interfering with ADLs and are impacting quality of life.  On Modified Oswestry patient scored 22/50 demonstrating 44% or moderate disability.  Karmah will benefit from skilled PT to address above deficits to improve mobility and activity tolerance with decreased pain interference.     OBJECTIVE IMPAIRMENTS: difficulty walking, decreased strength, postural dysfunction, and pain.   ACTIVITY LIMITATIONS: carrying, standing, stairs, and locomotion level  PARTICIPATION LIMITATIONS: meal prep, cleaning, laundry, shopping, and community activity  PERSONAL FACTORS: Fitness, Time since onset of injury/illness/exacerbation, and 1-2 comorbidities: NIDDM 2, HTN, h/o pancreatitis, palpitations are also affecting patient's functional outcome.   REHAB POTENTIAL: Good  CLINICAL DECISION MAKING: Evolving/moderate complexity  EVALUATION COMPLEXITY: Moderate   GOALS: Goals reviewed with patient? Yes  SHORT TERM GOALS: Target date: 09/12/2024    Patient will be independent with initial HEP to improve outcomes and carryover.  Baseline: 100% PT assist required for correct completion Goal status: MET- 08/29/24  2.  Patient will report 25% improvement in low back pain to improve QOL. Baseline: 10/10 worst 08/29/24:  6.5/10 Goal status: MET   LONG TERM GOALS: Target date: 10/11/2024   Patient will be independent with ongoing/advanced HEP for self-management at home.  Baseline: no advanced HEP yet 08/29/24:  medbridge updated Goal status: IN PROGRESS  2.  Patient will report 50-75% improvement in low back pain to improve QOL.  Baseline:  10/10 worst Goal status: IN PROGRESS  3.  Patient to demonstrate ability to achieve and maintain good spinal alignment/posturing and body mechanics needed for daily activities. Baseline: significant lumbar lordosis Goal status: INITIAL  4.  Patient will demonstrate full pain free lumbar ROM to perform ADLs.   Baseline: Refer to above lumbar ROM  table Goal status: INITIAL  5.  Patient will demonstrate improved BLE strength to >/= 5/5 for improved stability and ease of mobility. Baseline: Refer to above LE MMT table Goal status: INITIAL  6. Patient will report </= 30% on Modified Oswestry (MCID = 12%) to demonstrate improved functional ability with decreased pain interference. Baseline: 44% Goal status: INITIAL   PLAN:  PT FREQUENCY: 1-2x/week  PT DURATION: 8 weeks  PLANNED INTERVENTIONS: 97164- PT Re-evaluation, 97110-Therapeutic exercises, 97530- Therapeutic activity, [id number]- Neuromuscular re-education, 97535- Self Care, 02859- Manual therapy, [id number]- Electrical stimulation (unattended), 97016- Vasopneumatic device, [id number]- Ultrasound, [id number]- Traction (mechanical), [id number]- Ionotophoresis 4mg /ml Dexamethasone , 79439 (1-2 muscles), 20561 (3+ muscles)- Dry Needling, Patient/Family education, Taping, Joint mobilization, Spinal mobilization, Cryotherapy, and Moist heat  PLAN FOR NEXT SESSION: see how e-stim did,  might need MFR to L low back and hip, Continue with lumbar stabilization and strengthening;  PPT exercise   Jefrey Raburn, PT 08/29/2024, 10:05 AM

## 2024-08-31 NOTE — Anesthesia Preprocedure Evaluation (Signed)
 Anesthesia Evaluation  Patient identified by MRN, date of birth, ID band Patient awake    Reviewed: Allergy & Precautions, NPO status , Patient's Chart, lab work & pertinent test results  History of Anesthesia Complications Negative for: history of anesthetic complications  Airway Mallampati: III  TM Distance: >3 FB Neck ROM: Full    Dental  (+) Dental Advisory Given, Teeth Intact   Pulmonary sleep apnea    Pulmonary exam normal        Cardiovascular hypertension, Pt. on medications Normal cardiovascular exam     Neuro/Psych  Headaches PSYCHIATRIC DISORDERS  Depression       GI/Hepatic Neg liver ROS,GERD  ,,  Endo/Other  diabetes, Type 2  Class 4 obesity On GLP-1a   Renal/GU negative Renal ROS     Musculoskeletal negative musculoskeletal ROS (+)    Abdominal  (+) + obese  Peds  Hematology negative hematology ROS (+)   Anesthesia Other Findings   Reproductive/Obstetrics  PCOS                               Anesthesia Physical Anesthesia Plan  ASA: 3  Anesthesia Plan: MAC   Post-op Pain Management: Minimal or no pain anticipated   Induction:   PONV Risk Score and Plan: 2 and Propofol  infusion and Treatment may vary due to age or medical condition  Airway Management Planned: Nasal Cannula and Natural Airway  Additional Equipment: None  Intra-op Plan:   Post-operative Plan:   Informed Consent: I have reviewed the patients History and Physical, chart, labs and discussed the procedure including the risks, benefits and alternatives for the proposed anesthesia with the patient or authorized representative who has indicated his/her understanding and acceptance.       Plan Discussed with: CRNA and Anesthesiologist  Anesthesia Plan Comments: (Was told at some point in the past that she was a difficult intubation due to narrow airway and large tongue, but most recent airway  note in our records from 2022 stated she was a grade 2 view with Mil 2. While patient does have a large tongue on exam, other airway features do not present as concerning.)         Anesthesia Quick Evaluation

## 2024-09-01 ENCOUNTER — Ambulatory Visit (HOSPITAL_COMMUNITY)
Admission: RE | Admit: 2024-09-01 | Discharge: 2024-09-01 | Disposition: A | Discharge status: Home / Self Care | Attending: Gastroenterology | Admitting: Gastroenterology

## 2024-09-01 ENCOUNTER — Ambulatory Visit (HOSPITAL_COMMUNITY): Payer: Self-pay | Admitting: Anesthesiology

## 2024-09-01 ENCOUNTER — Other Ambulatory Visit: Payer: Self-pay

## 2024-09-01 ENCOUNTER — Encounter (HOSPITAL_COMMUNITY): Admission: RE | Disposition: A | Payer: Self-pay | Source: Home / Self Care | Attending: Gastroenterology

## 2024-09-01 ENCOUNTER — Encounter (HOSPITAL_COMMUNITY): Payer: Self-pay | Admitting: Gastroenterology

## 2024-09-01 DIAGNOSIS — Z1211 Encounter for screening for malignant neoplasm of colon: Secondary | ICD-10-CM | POA: Diagnosis not present

## 2024-09-01 DIAGNOSIS — G4730 Sleep apnea, unspecified: Secondary | ICD-10-CM | POA: Insufficient documentation

## 2024-09-01 DIAGNOSIS — K621 Rectal polyp: Secondary | ICD-10-CM | POA: Insufficient documentation

## 2024-09-01 DIAGNOSIS — D123 Benign neoplasm of transverse colon: Secondary | ICD-10-CM | POA: Diagnosis not present

## 2024-09-01 DIAGNOSIS — E282 Polycystic ovarian syndrome: Secondary | ICD-10-CM | POA: Diagnosis not present

## 2024-09-01 DIAGNOSIS — E6689 Other obesity not elsewhere classified: Secondary | ICD-10-CM | POA: Diagnosis not present

## 2024-09-01 DIAGNOSIS — Z79899 Other long term (current) drug therapy: Secondary | ICD-10-CM | POA: Insufficient documentation

## 2024-09-01 DIAGNOSIS — Z6841 Body Mass Index (BMI) 40.0 and over, adult: Secondary | ICD-10-CM | POA: Diagnosis not present

## 2024-09-01 DIAGNOSIS — K6389 Other specified diseases of intestine: Secondary | ICD-10-CM | POA: Diagnosis not present

## 2024-09-01 DIAGNOSIS — E119 Type 2 diabetes mellitus without complications: Secondary | ICD-10-CM | POA: Insufficient documentation

## 2024-09-01 DIAGNOSIS — K648 Other hemorrhoids: Secondary | ICD-10-CM | POA: Diagnosis not present

## 2024-09-01 DIAGNOSIS — K635 Polyp of colon: Secondary | ICD-10-CM | POA: Diagnosis not present

## 2024-09-01 DIAGNOSIS — I10 Essential (primary) hypertension: Secondary | ICD-10-CM | POA: Diagnosis not present

## 2024-09-01 DIAGNOSIS — Z7985 Long-term (current) use of injectable non-insulin antidiabetic drugs: Secondary | ICD-10-CM | POA: Insufficient documentation

## 2024-09-01 DIAGNOSIS — D126 Benign neoplasm of colon, unspecified: Secondary | ICD-10-CM

## 2024-09-01 HISTORY — PX: COLONOSCOPY: SHX5424

## 2024-09-01 LAB — GLUCOSE, CAPILLARY: Glucose-Capillary: 150 mg/dL — ABNORMAL HIGH (ref 70–99)

## 2024-09-01 SURGERY — COLONOSCOPY
Anesthesia: Monitor Anesthesia Care

## 2024-09-01 MED ORDER — SODIUM CHLORIDE 0.9 % IV SOLN: INTRAVENOUS | Status: DC

## 2024-09-01 MED ORDER — PROPOFOL 500 MG/50ML IV EMUL
INTRAVENOUS | Status: DC | PRN
Start: 2024-09-01 — End: 2024-09-01
  Administered 2024-09-01: 175 ug/kg/min via INTRAVENOUS

## 2024-09-01 MED ORDER — PHENYLEPHRINE HCL (PRESSORS) 10 MG/ML IV SOLN
INTRAVENOUS | Status: DC | PRN
  Administered 2024-09-01 (×2): 160 ug via INTRAVENOUS

## 2024-09-01 NOTE — Op Note (Signed)
 [facility] Patient Name: Angel Dawson Procedure Date: 09/01/2024 MRN: [id number] Attending MD: Elspeth SQUIBB. Leigh , MD, [id number] Date of Birth: [birth year] CSN: [id number] Age: 43 Admit Type: Outpatient Procedure:                Colonoscopy Indications:              Screening for colorectal malignant neoplasm, This                            is the patient's first colonoscopy Providers:                Elspeth SQUIBB. Leigh, MD, Darleene Bare, RN, Curtistine Bishop, Technician Referring MD:              Medicines:                Monitored Anesthesia Care Complications:            No immediate complications. Estimated blood loss:                            Minimal. Estimated Blood Loss:     Estimated blood loss was minimal. Procedure:                Pre-Anesthesia Assessment:                           - Prior to the procedure, a History and Physical                            was performed, and patient medications and                            allergies were reviewed. The patient's tolerance of                            previous anesthesia was also reviewed. The risks                            and benefits of the procedure and the sedation                            options and risks were discussed with the patient.                            All questions were answered, and informed consent                            was obtained. Prior Anticoagulants: The patient has                            taken no anticoagulant or antiplatelet agents. ASA                            Grade  Assessment: III - A patient with severe                            systemic disease. After reviewing the risks and                            benefits, the patient was deemed in satisfactory                            condition to undergo the procedure.                           After obtaining informed consent, the colonoscope                            was passed  under direct vision. Throughout the                            procedure, the patient's blood pressure, pulse, and                            oxygen saturations were monitored continuously. The                            CF-HQ190L ([id number]) Olympus colonoscope was                            introduced through the anus and advanced to the the                            cecum, identified by appendiceal orifice and                            ileocecal valve. The colonoscopy was performed                            without difficulty. The patient tolerated the                            procedure well. The quality of the bowel                            preparation was good. The ileocecal valve,                            appendiceal orifice, and rectum were photographed. Scope In: 8:32:03 AM Scope Out: 8:50:40 AM Scope Withdrawal Time: 0 hours 15 minutes 4 seconds  Total Procedure Duration: 0 hours 18 minutes 37 seconds  Findings:      The perianal and digital rectal examinations were normal.      A 3 mm polyp was found in the hepatic flexure. The polyp was sessile.       The polyp was removed with a cold snare. Resection and retrieval were       complete.      A  4 to 5 mm polyp was found in the descending colon. The polyp was       sessile. The polyp was removed with a cold snare. Resection and       retrieval were complete.      A 3 mm polyp was found in the rectum. The polyp was sessile. The polyp       was removed with a cold snare. Resection and retrieval were complete.      Internal hemorrhoids were found during retroflexion.      The exam was otherwise without abnormality. Impression:               - One 3 mm polyp at the hepatic flexure, removed                            with a cold snare. Resected and retrieved.                           - One 4 to 5 mm polyp in the descending colon,                            removed with a cold snare. Resected and retrieved.                            - One 3 mm polyp in the rectum, removed with a cold                            snare. Resected and retrieved.                           - Internal hemorrhoids.                           - The examination was otherwise normal. Moderate Sedation:      No moderate sedation, case performed with MAC Recommendation:           - Patient has a contact number available for                            emergencies. The signs and symptoms of potential                            delayed complications were discussed with the                            patient. Return to normal activities tomorrow.                            Written discharge instructions were provided to the                            patient.                           - Resume previous diet.                           -  Continue present medications.                           - Await pathology results. Procedure Code(s):        --- Professional ---                           [phone], Colonoscopy, flexible; with removal of                            tumor(s), polyp(s), or other lesion(s) by snare                            technique Diagnosis Code(s):        --- Professional ---                           Z12.11, Encounter for screening for malignant                            neoplasm of colon                           D12.3, Benign neoplasm of transverse colon (hepatic                            flexure or splenic flexure)                           D12.4, Benign neoplasm of descending colon                           D12.8, Benign neoplasm of rectum                           K64.8, Other hemorrhoids CPT copyright 2022 American Medical Association. All rights reserved. The codes documented in this report are preliminary and upon coder review may  be revised to meet current compliance requirements. Elspeth P. Vola Beneke, MD 09/01/2024 8:56:30 AM This report has been signed electronically. Number of Addenda: 0

## 2024-09-01 NOTE — Discharge Instructions (Signed)

## 2024-09-01 NOTE — H&P (Signed)
 [facility] History and Physical   Primary Care Physician:  Watt [name], MD   Reason for Procedure:   Colon cancer screening  Plan:    colonoscopy     HPI: Angel Dawson is a 43 y.o. female  here for colonoscopy screening - first time exam. Some mild constipation at baseline while on Mounjaro . Patient denies any new bowel symptoms at this time. No family history of colon cancer known. Otherwise feels well without any cardiopulmonary symptoms. Case done at the hospital for anesthesia support given BMI > 50.  I have discussed risks / benefits of anesthesia and endoscopic procedure with [name] and they wish to proceed with the exams as outlined today.   The patient was provided an opportunity to ask questions and all were answered. The patient agreed with the plan.    Past Medical History:  Diagnosis Date   Abdominal pain    related to gallstones   Achilles tendonitis, bilateral    Back pain    Bronchitis    02/17   Chest pain    Constipation    Diabetes (HCC)    Type II   DKA (diabetic ketoacidoses) 08/25/2014   Gallstones    GERD (gastroesophageal reflux disease)    Headache    migraines   Hypertension    Joint pain    Lattice degeneration, both eyes    Palpitations    Pancreatitis    PCOS (polycystic ovarian syndrome)    Sinus congestion    occ. uses OTC meds for this   Sleep apnea    USES CPAP   Vitamin D  deficiency     Past Surgical History:  Procedure Laterality Date   CHOLECYSTECTOMY N/A 06/22/2013   Procedure: LAPAROSCOPIC CHOLECYSTECTOMY ;  Surgeon: Elon CHRISTELLA Pacini, MD;  Location: [facility];  Service: General;  Laterality: N/A;   DILITATION & CURRETTAGE/HYSTROSCOPY WITH HYDROTHERMAL ABLATION N/A 01/03/2016   Procedure: DILATATION & CURETTAGE/HYSTEROSCOPY WITH HYDROTHERMAL ABLATION;  Surgeon: [name], MD;  Location: [facility];  Service: Gynecology;  Laterality: N/A;  HTA rep will be attend confirmed by office 12/12/15     NASAL SEPTOPLASTY W/ TURBINOPLASTY Bilateral 01/11/2021   Procedure: NASAL SEPTOPLASTY WITH TURBINATE REDUCTION;  Surgeon: [name], MD;  Location: [facility] OR;  Service: ENT;  Laterality: Bilateral;   TONSILLECTOMY      Prior to Admission medications   Medication Sig Start Date End Date Taking? Authorizing Provider  Blood Glucose Monitoring Suppl (BLOOD GLUCOSE METER KIT AND SUPPLIES) Dispense based on patient and insurance preference. Use up to four times daily as directed. (FOR ICD-9 250.00, 250.01). 08/26/14  Yes [name], [name], MD  Chlorophyll-Alfalfa 20-100 MG TABS Take 100 mg by mouth daily.   Yes [provider]  Continuous Blood Gluc Sensor (FREESTYLE LIBRE 3 SENSOR) MISC APPLY SENSOR ON UPPER ARM EVERY 14 DAYS FOR CONTINUOUS GLUCOSE MONITORING 06/06/22  Yes [name], MD  Continuous Blood Gluc Sensor (FREESTYLE LIBRE 3 SENSOR) MISC Use for continuous blood glucose monitoring 08/20/22  Yes [name], [name], MD  Continuous Glucose Sensor (FREESTYLE LIBRE 3 PLUS SENSOR) MISC Change sensor every 15 days. 02/22/24  Yes [name], [name], MD  glucose blood (ONETOUCH VERIO) test strip USE TO TEST BLOOD SUGAR ONCE DAILY 07/24/23  Yes [name], [name], MD  lisinopril -hydrochlorothiazide  (ZESTORETIC ) 10-12.5 MG tablet Take 1 tablet by mouth daily as needed (if BP elevates). 06/13/23  Yes [provider]  lisinopril -hydrochlorothiazide  (ZESTORETIC ) 20-25 MG tablet Take 1 tablet by  mouth daily. 02/22/24  Yes [name], [name], MD  loratadine (CLARITIN) 10 MG tablet Take 10 mg by mouth daily.   Yes [provider]  Multiple Vitamin (MULTIVITAMIN) tablet Take 1 tablet by mouth daily.   Yes [provider]  omega-3 acid ethyl esters (LOVAZA) 1 g capsule Take 2 g by mouth 2 (two) times daily.   Yes [provider]  OneTouch Delica Lancets 33G MISC Use once daily to check glucose. 07/24/23  Yes [name], [name], MD  Probiotic Product (PROBIOTIC-10 PO)  Take 1 capsule by mouth daily. Takes Fortify Women's Digestive Probiotic   Yes [provider]  rosuvastatin  (CRESTOR ) 5 MG tablet Take 1 tablet (5 mg total) by mouth daily. 02/22/24  Yes [name], [name], MD  VITAMIN D  PO Take 5,000 Units by mouth daily. WITH VITAMIN K   Yes [provider]  albuterol  (VENTOLIN  HFA) 108 (90 Base) MCG/ACT inhaler Inhale 1-2 puffs into the lungs every 6 (six) hours as needed for wheezing or shortness of breath. 05/06/23   Dreama, [place]  N, FNP  clotrimazole  (MYCELEX ) 10 MG troche Take 1 tablet (10 mg total) by mouth 5 (five) times daily. 04/27/24   [name], [name], MD  Multiple Vitamins-Minerals (MULTIVITAMIN WITH MINERALS) tablet Take 1 tablet by mouth daily. With Diabetic Support    [provider]  tirzepatide  (MOUNJARO ) 10 MG/0.5ML Pen Inject 10 mg into the skin once a week. 02/22/24   [name], [name], MD    Current Facility-Administered Medications  Medication Dose Route Frequency Provider Last Rate Last Admin   0.9 %  sodium chloride  infusion   Intravenous Continuous Danyeal Akens, Elspeth SQUIBB, MD        Allergies as of 06/22/2024   (No Known Allergies)    Family History  Problem Relation Age of Onset   Sleep apnea Mother    Thyroid  disease Mother    Hypertension Mother    Diabetes Mother    Goiter Mother    Anxiety disorder Mother    Obesity Mother    Anxiety disorder Father    Hypertension Father    Diabetes Father    Asthma Father    Bipolar disorder Father    Depression Father    Diabetes Maternal Aunt    Lung cancer Paternal Grandmother        smoker-heavy   Stroke Paternal Grandfather    Colon cancer Neg Hx    Heart disease Neg Hx    Esophageal cancer Neg Hx    Rectal cancer Neg Hx    Stomach cancer Neg Hx     Social History   Socioeconomic History   Marital status: Single    Spouse name: Not on file   Number of children: 0   Years of education: Not on file   Highest education level: Bachelor's  degree (e.g., BA, AB, BS)  Occupational History   Occupation: Soil Scientist: [employer]  Tobacco Use   Smoking status: Never   Smokeless tobacco: Never  Vaping Use   Vaping status: Never Used  Substance and Sexual Activity   Alcohol use: Not Currently    Alcohol/week: 0.0 standard drinks of alcohol    Comment: 1 drink per month   Drug use: No   Sexual activity: Never    Birth control/protection: None  Other Topics Concern   Not on file  Social History Narrative   Caffeine: bang energy drinks 300 mg/can, 1 can per day, maybe a 12  oz iced coffee   Social Drivers of Corporate Investment Banker Strain: Low Risk  (07/06/2023)   Overall Financial Resource Strain (CARDIA)    Difficulty of Paying Living Expenses: Not hard at all  Food Insecurity: Food Insecurity Present (07/06/2023)   Hunger Vital Sign    Worried About Running Out of Food in the Last Year: Sometimes true    Ran Out of Food in the Last Year: Never true  Transportation Needs: No Transportation Needs (07/06/2023)   PRAPARE - Administrator, Civil Service (Medical): No    Lack of Transportation (Non-Medical): No  Physical Activity: Insufficiently Active (07/06/2023)   Exercise Vital Sign    Days of Exercise per Week: 1 day    Minutes of Exercise per Session: 10 min  Stress: No Stress Concern Present (07/06/2023)   [employer] of Occupational Health - Occupational Stress Questionnaire    Feeling of Stress : Only a little  Social Connections: Moderately Integrated (07/06/2023)   Social Connection and Isolation Panel    Frequency of Communication with Friends and Family: More than three times a week    Frequency of Social Gatherings with Friends and Family: Three times a week    Attends Religious Services: More than 4 times per year    Active Member of Clubs or Organizations: Yes    Attends Engineer, Structural: More than 4 times per year    Marital Status: Never married   Intimate Partner Violence: Not on file    Review of Systems: All other review of systems negative except as mentioned in the HPI.  Physical Exam: Vital signs BP (!) 172/92   Pulse 84   Temp 98.9 F (37.2 C) (Oral)   Resp 16   Ht 5' 11.5 (1.816 m)   Wt (!) 181.6 kg   SpO2 99%   BMI 55.05 kg/m   General:   Alert,  Well-developed, pleasant and cooperative in NAD Lungs:  Clear throughout to auscultation.   Heart:  Regular rate and rhythm Abdomen:  Soft, nontender and nondistended.   Neuro/Psych:  Alert and cooperative. Normal mood and affect. A and O x 3  Marcey Naval, MD [facility]

## 2024-09-01 NOTE — Transfer of Care (Signed)
 Immediate Anesthesia Transfer of Care Note  Patient: Angel Dawson  Procedure(s) Performed: COLONOSCOPY  Patient Location: PACU  Anesthesia Type:MAC  Level of Consciousness: awake, sedated, patient cooperative, and responds to stimulation  Airway & Oxygen Therapy: Patient Spontanous Breathing and Patient connected to face mask oxygen  Post-op Assessment: Report given to RN and Post -op Vital signs reviewed and stable  Post vital signs: Reviewed and stable  Last Vitals:  Vitals Value Taken Time  BP 69/31 09/01/24 08:56  Temp    Pulse 92 09/01/24 08:59  Resp 26 09/01/24 08:59  SpO2 100 % 09/01/24 08:59  Vitals shown include unfiled device data.  Last Pain:  Vitals:   09/01/24 0740  TempSrc: Oral  PainSc: 0-No pain         Complications: No notable events documented.

## 2024-09-01 NOTE — Anesthesia Postprocedure Evaluation (Signed)
 Anesthesia Post Note  Patient: Angel Dawson  Procedure(s) Performed: COLONOSCOPY     Patient location during evaluation: PACU Anesthesia Type: MAC Level of consciousness: awake and alert Pain management: pain level controlled Vital Signs Assessment: post-procedure vital signs reviewed and stable Respiratory status: spontaneous breathing, nonlabored ventilation and respiratory function stable Cardiovascular status: stable and blood pressure returned to baseline Anesthetic complications: no   No notable events documented.  Last Vitals:  Vitals:   09/01/24 0910 09/01/24 0920  BP: 134/67 (!) 118/59  Pulse: 93 84  Resp: (!) 27 19  Temp:    SpO2: 100% 96%    Last Pain:  Vitals:   09/01/24 0910  TempSrc:   PainSc: 0-No pain                 Debby FORBES Like

## 2024-09-02 ENCOUNTER — Ambulatory Visit: Admitting: Rehabilitation

## 2024-09-02 ENCOUNTER — Encounter (HOSPITAL_COMMUNITY): Payer: Self-pay | Admitting: Gastroenterology

## 2024-09-05 ENCOUNTER — Encounter: Payer: Self-pay | Admitting: Rehabilitation

## 2024-09-05 ENCOUNTER — Ambulatory Visit: Admitting: Rehabilitation

## 2024-09-05 DIAGNOSIS — M5442 Lumbago with sciatica, left side: Secondary | ICD-10-CM | POA: Diagnosis not present

## 2024-09-05 DIAGNOSIS — M5432 Sciatica, left side: Secondary | ICD-10-CM

## 2024-09-05 DIAGNOSIS — R262 Difficulty in walking, not elsewhere classified: Secondary | ICD-10-CM | POA: Diagnosis not present

## 2024-09-05 DIAGNOSIS — M5459 Other low back pain: Secondary | ICD-10-CM | POA: Diagnosis not present

## 2024-09-05 DIAGNOSIS — R293 Abnormal posture: Secondary | ICD-10-CM | POA: Diagnosis not present

## 2024-09-05 DIAGNOSIS — G8929 Other chronic pain: Secondary | ICD-10-CM | POA: Diagnosis not present

## 2024-09-05 LAB — SURGICAL PATHOLOGY

## 2024-09-05 NOTE — Therapy (Signed)
 OUTPATIENT PHYSICAL THERAPY THORACOLUMBAR TREATMENT   Patient Name: Angel Dawson MRN: [id number] DOB:1981/09/15, 43 y.o., female Today's Date: 09/05/2024  END OF SESSION:  PT End of Session - 09/05/24 0854     Visit Number 4    Date for Recertification  10/10/24    PT Start Time 0848   8 min late   PT Stop Time 0949    PT Time Calculation (min) 61 min    Activity Tolerance Patient tolerated treatment well;No increased pain    Behavior During Therapy [facility] for tasks assessed/performed           Past Medical History:  Diagnosis Date   Abdominal pain    related to gallstones   Achilles tendonitis, bilateral    Back pain    Bronchitis    02/17   Chest pain    Constipation    Diabetes (HCC)    Type II   DKA (diabetic ketoacidoses) 08/25/2014   Gallstones    GERD (gastroesophageal reflux disease)    Headache    migraines   Hypertension    Joint pain    Lattice degeneration, both eyes    Palpitations    Pancreatitis    PCOS (polycystic ovarian syndrome)    Sinus congestion    occ. uses OTC meds for this   Sleep apnea    USES CPAP   Vitamin D  deficiency    Past Surgical History:  Procedure Laterality Date   CHOLECYSTECTOMY N/A 06/22/2013   Procedure: LAPAROSCOPIC CHOLECYSTECTOMY ;  Surgeon: Elon CHRISTELLA Pacini, MD;  Location: [facility];  Service: General;  Laterality: N/A;   COLONOSCOPY N/A 09/01/2024   Procedure: COLONOSCOPY;  Surgeon: Leigh Elspeth SQUIBB, MD;  Location: [facility];  Service: Gastroenterology;  Laterality: N/A;   DILITATION & CURRETTAGE/HYSTROSCOPY WITH HYDROTHERMAL ABLATION N/A 01/03/2016   Procedure: DILATATION & CURETTAGE/HYSTEROSCOPY WITH HYDROTHERMAL ABLATION;  Surgeon: [name], MD;  Location: [facility];  Service: Gynecology;  Laterality: N/A;  HTA rep will be attend confirmed by office 12/12/15    NASAL SEPTOPLASTY W/ TURBINOPLASTY Bilateral 01/11/2021   Procedure: NASAL SEPTOPLASTY WITH TURBINATE REDUCTION;  Surgeon: [name], MD;   Location: [facility] OR;  Service: ENT;  Laterality: Bilateral;   TONSILLECTOMY     Patient Active Problem List   Diagnosis Date Noted   Benign neoplasm of colon 09/01/2024   Menorrhagia 01/20/2023   Insulin  resistance 12/01/2022   Other fatigue 11/13/2022   SOBOE (shortness of breath on exertion) 11/13/2022   Other chronic pain 11/13/2022   Other hyperlipidemia 11/13/2022   Depression 11/13/2022   Screening for colon cancer 11/13/2022   Type 2 diabetes mellitus with other specified complication (HCC) 08/19/2022   Essential hypertension 08/19/2022   Tinnitus of both ears 12/05/2021   Deviated septum 01/11/2021   Nasal turbinate hypertrophy 04/08/2018   Obstructive sleep apnea treated with continuous positive airway pressure (CPAP) 12/29/2017   Onychomycosis 10/26/2014   Gastroesophageal reflux disease without esophagitis 09/28/2014   morbid obesity with starting BMI 55 08/25/2014   Chronic rhinitis 06/30/2014   Pancreatitis, acute 07/02/2013   Status post laparoscopic cholecystectomy 06/24/2013   Gallstones 04/13/2013   Low back pain 08/09/2012   Vitamin D  deficiency 08/09/2012    PCP: [name], [name], MD   REFERRING PROVIDER: Emiliano Leonce CROME, PA*   REFERRING DIAG: [phone] (ICD-10-CM) - Chronic bilateral low back pain with left-sided sciatica  THERAPY DIAG:  Other low back pain  Sciatica, left side  Abnormal posture  Difficulty in walking, not  elsewhere classified  RATIONALE FOR EVALUATION AND TREATMENT: Rehabilitation  ONSET DATE:   NEXT MD VISIT:    SUBJECTIVE:                                                                                                                                                                                                         SUBJECTIVE STATEMENT: States feeling ok.   Still has the pain in the L lower back/SI/and upper hip.   Rates the pain 4/10.   She is working on her exercises at home per her report except for 1 day  last week.     43 y/o patient referred to PT from orthopedics for acute on chronic LBP w/ L sciatica.   She reports that she has a h/o L lower back pain with radiation into the L hip and thigh.  This started 18-20 yrs ago when she fell on her back in the bathtub.   However, 2-3 weeks ago she started having sharp sudden intense B back pain/spasm that felt like an electric jolt that would hit her for 5-10 seconds many times daily and then be gone.   She denies any trauma such as falling or any changes in activity level with lifting/travel/etc.   The common factor sees to be the pain occurs when she is in a standing position.   The pain doesn't occur when she is sitting or lying down.   Sleep is not disrupted.  Going up steps increases her general sore back pain.  She has to lead with the RLE going up steps due to LLE weaknes.    Sit to stand transfers can increase the pain.     PAIN: Are you having pain? Yes: NPRS scale: 5/10 now;  10/10 sudden/quick pain Pain location: low back Pain description: sharp and stabbing for 10-20 sec;  achy and radiating other time Aggravating factors: standing with back extension seems to precipitate Relieving factors: sitting/lying  PERTINENT HISTORY:  NIDDM 2, HTN, h/o pancreatitis, palpitations, B Achilles tendonitis  PRECAUTIONS: None  RED FLAGS: None  WEIGHT BEARING RESTRICTIONS: No  FALLS:  Has patient fallen in last 6 months? No  LIVING ENVIRONMENT: Lives with: lives alone Lives in: House/apartment Stairs: Yes: External: 3 steps; none Has following equipment at home: None  OCCUPATION: administrative desk work 7 hrs daily  PLOF: Independent  PATIENT GOALS: not hurt in the back   OBJECTIVE: (objective measures completed at initial evaluation unless otherwise dated)  DIAGNOSTIC FINDINGS:  EXAM: LUMBAR SPINE - COMPLETE 4+ VIEW   COMPARISON:  07/29/2021  FINDINGS: Five non-rib-bearing lumbar vertebra. Slight dextroscoliotic curvature at  L4-L5. 8 mm grade 1 anterolisthesis of L4 on L5. No change in alignment on flexion or extension. Mild L4-L5 disc space narrowing and spurring. Mild multilevel facet hypertrophy. No evidence of pars defects or focal bone abnormalities right upper quadrant surgical clips again seen.   IMPRESSION: 1. Grade 1 anterolisthesis of L4 on L5. No change in alignment on flexion or extension. 2. Mild L4-L5 degenerative disc disease. Mild multilevel facet hypertrophy.     Electronically Signed   By: Andrea Gasman M.D.   On: 07/25/2024 16:57EXAM: PELVIS - 1-2 VIEW   COMPARISON:  None Available.   FINDINGS: The hip joint spaces are preserved. Both femoral heads are well seated in the respective acetabula. No fracture. No evidence of erosion or avascular necrosis. The pubic symphysis and sacroiliac joints are congruent. Left pelvic phleboliths.   IMPRESSION: Negative radiograph of the pelvis.     Electronically Signed   By: Andrea Gasman M.D.   On: 07/25/2024 16:58  PATIENT SURVEYS:  Modified Oswestry:  MODIFIED OSWESTRY DISABILITY SCALE  Date: 09/05/2024 Score  Pain intensity 4 =  Pain medication provides me with little relief from pain.  2. Personal care (washing, dressing, etc.) 2 =  It is painful to take care of myself, and I am slow and careful.  3. Lifting 1 = I can lift heavy weights, but it causes increased pain.  4. Walking 2 =  Pain prevents me from walking more than  mile.  5. Sitting 1 =  I can only sit in my favorite chair as long as I like.  6. Standing 3 =  Pain prevents me from standing more than 1/2 hour.  7. Sleeping 2 =  Even when I take pain medication, I sleep less than 6 hours  8. Social Life 3 =  Pain prevents me from going out very often.  9. Traveling 3 = My pain restricts my travel over 1 hour  10. Employment/ Homemaking 1 = My normal homemaking/job activities increase my pain, but I can still perform all that is required of me  Total 22/50    Interpretation of scores: Score Category Description  0-20% Minimal Disability The patient can cope with most living activities. Usually no treatment is indicated apart from advice on lifting, sitting and exercise  21-40% Moderate Disability The patient experiences more pain and difficulty with sitting, lifting and standing. Travel and social life are more difficult and they may be disabled from work. Personal care, sexual activity and sleeping are not grossly affected, and the patient can usually be managed by conservative means  41-60% Severe Disability Pain remains the main problem in this group, but activities of daily living are affected. These patients require a detailed investigation  61-80% Crippled Back pain impinges on all aspects of the patient's life. Positive intervention is required  81-100% Bed-bound These patients are either bed-bound or exaggerating their symptoms  Bluford FORBES Zoe DELENA Karon DELENA, et al. Surgery versus conservative management of stable thoracolumbar fracture: the PRESTO feasibility RCT. [place] ([place]): [employer]; 2021 Nov. [facility] Technology Assessment, No. 25.62.) Appendix 3, Oswestry Disability Index category descriptors. Available from: Findjewelers.cz  Minimally Clinically Important Difference (MCID) = 12.8%  SCREENING FOR RED FLAGS: Bowel or bladder incontinence: No Spinal tumors: No Cauda equina syndrome: No Compression fracture: No Abdominal aneurysm: No  COGNITION:  Overall cognitive status: Within functional limits for tasks assessed    SENSATION: WFL  POSTURE:  increased lumbar lordosis  LLE is longer in supine at the malleolar level  PALPATION: TTP over B lumbar paraspinals and L SI joint;   P/A lumbar vertebral glides are hypermobile feeling  LUMBAR ROM:   Active  Eval  Flexion Just below knees; no pain  Extension 100% ROM;  end range reproduces her sharp/brief intense pain  Right lateral  flexion Below knee  Left lateral flexion Below knee  Right rotation 100%  Left rotation 100%  (Blank rows = not tested)  MUSCLE LENGTH: Hamstrings: Right SLR = 85 deg; Left SLR = 90+ deg Thomas test: Right  deg; Left  deg Hamstrings: not tight ITB: not tight Piriformis: moderately tight L and less so R Hip flexors: not tight Quads: not tight Heelcord: NT  LOWER EXTREMITY ROM:     Active  Right eval Left eval  Hip flexion    Hip extension    Hip abduction    Hip adduction    Hip internal rotation    Hip external rotation    Knee flexion    Knee extension    Ankle dorsiflexion    Ankle plantarflexion    Ankle inversion    Ankle eversion    (Blank rows = not tested)  LOWER EXTREMITY MMT:    MMT Right eval Left eval  Hip flexion    Hip extension    Hip abduction    Hip adduction    Hip internal rotation    Hip external rotation    Knee flexion    Knee extension    Ankle dorsiflexion    Ankle plantarflexion    Ankle inversion    Ankle eversion     (Blank rows = not tested)  LUMBAR SPECIAL TESTS:  Straight leg raise test: Negative, Slump test: Negative, SI Compression/distraction test: Negative, FABER test: Positive, and Gaenslen's test: Positive on the LLE Long sit test is long to short on the RLE indicating an anteriorly rotated inominate on the RLE vs. Posterior on LLE  FUNCTIONAL TESTS:  TBD  GAIT: Distance walked: into clinic Assistive device utilized: None Level of assistance: Complete Independence Gait pattern: supinates on the R, pronates on the LLE;  sway backed posture Comments:    TODAY'S TREATMENT:  09/05/24 THERAPEUTIC EXERCISE: To improve strength and endurance.  Demonstration, verbal and tactile cues throughout for technique. NuStep L4 x 7'  THERAPEUTIC ACTIVITIES: To improve functional performance.  Demonstration, verbal and tactile cues throughout for technique. MFI walkouts blue TB doubled x 6 laps each direction Lateral UE slides  blue TB doubled with straight trunk x 10 each direction R lateral sideglides x 10 w/ 3 sec holds Standing hip flexor stretch x 1' x 2 LLE Supine hip flexor stretch x 1' x 2 LLE  MANUAL THERAPY: To promote normalized muscle tension, improved flexibility, and reduced pain utilizing MET and percussion massage with massage gun. MET for L posterior inominate f/b pelvic shotgun x1 and Long leg traction pulls to LLE Theragun setting 2 to upper R hip, QL, paraspinals, and SI areas  MODALITIES: IFC to L low back and L posterior hip    08/29/24 THERAPEUTIC EXERCISE: To improve strength and endurance.  Demonstration, verbal and tactile cues throughout for technique. NuStep L4 x 7'  THERAPEUTIC ACTIVITIES: To improve functional performance.  Demonstration, verbal and tactile cues throughout for technique. Seated RDL w/ 15# from mat table x 20 Seated 65 cm swiss ball rollouts 11:00, 12:00, 1:00 x 20 each direction  NEUROMUSCULAR RE-EDUCATION: To improve  balance, coordination, kinesthesia, posture, and proprioception. Seated blue TB shoulder ext w/ hip flex x 2/10 BUE/BLE Attempted blue TB PNF extension/chop but increased LBP Seated palloff press Blue TB doubled x 2/10 MFI walkouts blue TB doubled w/ PPT x 2/10 each direction  MODALITIES: IFC to L low back and L posterior hip      08/18/24 NEUROMUSCULAR RE-EDUCATION: To improve coordination, kinesthesia, posture, and proprioception.  Supine PPT 10x3' Bridges with PPT 2x10 Supine curl ups 2x10; diagonal curl ups 2x10 Seated jefferson curl blue wt ball 2 x 10 Standing jefferson curl blue wt ball x 10 Standing lat pull + march RTB x 10  THERAPEUTIC EXERCISE: To improve strength, endurance, ROM, and flexibility.  Supine piriformis stretch opp leg straight 2 x 1 min B Quadruped cat 10x3' Seated lumbar flex stretch green pball x38min  08/15/24 SELF CARE: Provided education on PT POC progression; initial HEP;  postural correction w/ PPT  throughout the day;  avoiding lumbar extension   PATIENT EDUCATION:  Education details: PT eval findings, anticipated POC, and initial HEP  Person educated: Patient Education method: Explanation, Verbal cues, Tactile cues, Handouts, and MedBridgeGO app access provided Education comprehension: verbalized understanding, verbal cues required, tactile cues required, and needs further education  HOME EXERCISE PROGRAM: Access Code: [email or web address] URL: [email or web address] Date: 09/05/2024 Prepared by: Garnette Montclair  Exercises - Supine Posterior Pelvic Tilt  - 1 x daily - 7 x weekly - 3 sets - 10 reps - Quadruped Cat with Posterior Pelvic Tilt  - 1 x daily - 7 x weekly - 3 sets - 10 reps - Curl Up with Reach  - 1 x daily - 7 x weekly - 3 sets - 10 reps - Diagonal Curl Up with Reach  - 1 x daily - 7 x weekly - 3 sets - 10 reps - Supine Piriformis Stretch with Leg Straight  - 1 x daily - 7 x weekly - 1 sets - 2 reps - 1 min hold - Seated Flexion Stretch with Swiss Ball  - 1 x daily - 7 x weekly - 1 sets - 3 reps - 1 min hold - Seated Anti-Rotation Press With Anchored Resistance  - 1 x daily - 7 x weekly - 3 sets - 10 reps - Anti-Rotation Sidestepping with Resistance  - 1 x daily - 7 x weekly - 3 sets - 10 reps - Right Standing Lateral Shift Correction at Wall - Repetitions  - 1 x daily - 7 x weekly - 1 sets - 10 reps - 2 -3 sec hold - Hip Flexor Stretch at Edge of Bed  - 1 x daily - 7 x weekly - 1 sets - 2 reps - 1 min hold - Standing Hip Flexor Stretch  - 1 x daily - 7 x weekly - 1 sets - 2 reps - min hold ASSESSMENT:  CLINICAL IMPRESSION: Added MET for L posterior inominate today and patient definitely gets L hip flexor stretch with this.  She notes that she feels like her L hip is jammed all the time.   Since her LLE is longer this would make since.  She gets good relief with long leg traction, MFR, and modalities today.  Continue to recommend the heel/shoe lift for the RLE and  she is doing this.   Pain seems to be responding to PT.   However, PT remains necessary for pain, strength, HEP, ROM deficits.   Continue per POC  EVAL: Angel Dawson is a 43 y.o. female  who was referred to physical therapy for evaluation and treatment for acute on chronic LBP w/ sciatica.   Patient reports onset of LBP pain beginning 2-3 weeks ago for no apparent reason. She has some chronic L low back pain with sciatica into the LLE, but reports this is normally just soreness/achiness.    The pain she is getting now is sudden, sharp, severe and only last 5-10 seconds, but it nearly brings her to her knees.    Pain is worse with with standing activities.   She does not normally get the pain with sitting or lying down.  Her posture is significantly accentuated lumbar lordosis and she reproduces the sharp pain with end range lumbar extension.   She also has piriformis tightness, hip weakness, and tenderness to palpate. Her LLE is a little longer than the RLE.   She is generally hyperflexible and hypermobile.   MRI shows some spondylolisthesis of L4 on L5 with DDD changes.    Patient has deficits in  hip flexibility, BLE strength, abnormal posture, and TTP with pain in the low back and hips which are interfering with ADLs and are impacting quality of life.  On Modified Oswestry patient scored 22/50 demonstrating 44% or moderate disability.  Angel Dawson will benefit from skilled PT to address above deficits to improve mobility and activity tolerance with decreased pain interference.     OBJECTIVE IMPAIRMENTS: difficulty walking, decreased strength, postural dysfunction, and pain.   ACTIVITY LIMITATIONS: carrying, standing, stairs, and locomotion level  PARTICIPATION LIMITATIONS: meal prep, cleaning, laundry, shopping, and community activity  PERSONAL FACTORS: Fitness, Time since onset of injury/illness/exacerbation, and 1-2 comorbidities: NIDDM 2, HTN, h/o pancreatitis, palpitations are also affecting  patient's functional outcome.   REHAB POTENTIAL: Good  CLINICAL DECISION MAKING: Evolving/moderate complexity  EVALUATION COMPLEXITY: Moderate   GOALS: Goals reviewed with patient? Yes  SHORT TERM GOALS: Target date: 09/12/2024    Patient will be independent with initial HEP to improve outcomes and carryover.  Baseline: 100% PT assist required for correct completion Goal status: MET- 08/29/24  2.  Patient will report 25% improvement in low back pain to improve QOL. Baseline: 10/10 worst 08/29/24:  6.5/10 Goal status: MET   LONG TERM GOALS: Target date: 10/11/2024   Patient will be independent with ongoing/advanced HEP for self-management at home.  Baseline: no advanced HEP yet 08/29/24:  medbridge updated Goal status: IN PROGRESS  2.  Patient will report 50-75% improvement in low back pain to improve QOL.  Baseline: 10/10 worst 09/05/24:  4/10 today Goal status: IN PROGRESS  3.  Patient to demonstrate ability to achieve and maintain good spinal alignment/posturing and body mechanics needed for daily activities. Baseline: significant lumbar lordosis Goal status: INITIAL  4.  Patient will demonstrate full pain free lumbar ROM to perform ADLs.   Baseline: Refer to above lumbar ROM table Goal status: INITIAL  5.  Patient will demonstrate improved BLE strength to >/= 5/5 for improved stability and ease of mobility. Baseline: Refer to above LE MMT table Goal status: INITIAL  6. Patient will report </= 30% on Modified Oswestry (MCID = 12%) to demonstrate improved functional ability with decreased pain interference. Baseline: 44% Goal status: INITIAL   PLAN:  PT FREQUENCY: 1-2x/week  PT DURATION: 8 weeks  PLANNED INTERVENTIONS: 97164- PT Re-evaluation, 97110-Therapeutic exercises, 97530- Therapeutic activity, [id number]- Neuromuscular re-education, 97535- Self Care, [id number]- Manual therapy, [id number]- Electrical stimulation (unattended), 97016- Vasopneumatic device, [id number]-  Ultrasound, [id number]- Traction (mechanical), [id number]- Ionotophoresis 4mg /ml Dexamethasone , 79439 (1-2 muscles), 79438 (  3+ muscles)- Dry Needling, Patient/Family education, Taping, Joint mobilization, Spinal mobilization, Cryotherapy, and Moist heat  PLAN FOR NEXT SESSION: see how last treatment did.  Continue w/ core/lumbar strengthening -- modified plank?;  recheck long sit test and leg lengths, METs PRN,   MFR to L low back/hip, modalities PRN  Mescal Flinchbaugh, PT 09/05/2024, 1:11 PM

## 2024-09-06 ENCOUNTER — Ambulatory Visit: Payer: Self-pay | Admitting: Gastroenterology

## 2024-09-09 NOTE — Progress Notes (Unsigned)
 [facility] [address], [address] [place], [place] [address] [phone]  Date:  09/12/2024   Name:  Angel Dawson   DOB:  [birth year]   MRN:  [id number]  PCP:  Angel [name], MD    Chief Complaint: No chief complaint on file.   History of Present Illness:  Angel Dawson is a 43 y.o. very pleasant female patient who presents with the following:  Patient seen today for periodic follow-up.  I saw her most recently in May - history of DM, OSA, HTN, obesity, gallstone pancreatitis s/p cholecystectomy    She works in ship broker at [employer] She had a thyroid  nodule biopsy in October-[facility] 2, benign She does need to have a repeat ultrasound in fall 2026  Colonoscopy completed last month per Dr. Leigh Most recent labs in May-A1c 5.7% Due for tetanus Flu Most recent COVID Pap smear can be updated-?  Gynecology Mammogram due in March  Mounjaro  10 Crestor  5 Lisinopril  hydrochlorothiazide  Omega-3  Discussed the use of AI scribe software for clinical note transcription with the patient, who gave verbal consent to proceed.  History of Present Illness    Patient Active Problem List   Diagnosis Date Noted   Benign neoplasm of colon 09/01/2024   Menorrhagia 01/20/2023   Insulin  resistance 12/01/2022   Other fatigue 11/13/2022   SOBOE (shortness of breath on exertion) 11/13/2022   Other chronic pain 11/13/2022   Other hyperlipidemia 11/13/2022   Depression 11/13/2022   Screening for colon cancer 11/13/2022   Type 2 diabetes mellitus with other specified complication (HCC) 08/19/2022   Essential hypertension 08/19/2022   Tinnitus of both ears 12/05/2021   Deviated septum 01/11/2021   Nasal turbinate hypertrophy 04/08/2018   Obstructive sleep apnea treated with continuous positive airway pressure (CPAP) 12/29/2017   Onychomycosis 10/26/2014   Gastroesophageal reflux disease without  esophagitis 09/28/2014   morbid obesity with starting BMI 55 08/25/2014   Chronic rhinitis 06/30/2014   Pancreatitis, acute 07/02/2013   Status post laparoscopic cholecystectomy 06/24/2013   Gallstones 04/13/2013   Low back pain 08/09/2012   Vitamin D  deficiency 08/09/2012    Past Medical History:  Diagnosis Date   Abdominal pain    related to gallstones   Achilles tendonitis, bilateral    Back pain    Bronchitis    02/17   Chest pain    Constipation    Diabetes (HCC)    Type II   DKA (diabetic ketoacidoses) 08/25/2014   Gallstones    GERD (gastroesophageal reflux disease)    Headache    migraines   Hypertension    Joint pain    Lattice degeneration, both eyes    Palpitations    Pancreatitis    PCOS (polycystic ovarian syndrome)    Sinus congestion    occ. uses OTC meds for this   Sleep apnea    USES CPAP   Vitamin D  deficiency     Past Surgical History:  Procedure Laterality Date   CHOLECYSTECTOMY N/A 06/22/2013   Procedure: LAPAROSCOPIC CHOLECYSTECTOMY ;  Surgeon: Elon CHRISTELLA Pacini, MD;  Location: [facility];  Service: General;  Laterality: N/A;   COLONOSCOPY N/A 09/01/2024   Procedure: COLONOSCOPY;  Surgeon: Leigh Elspeth SQUIBB, MD;  Location: [facility];  Service: Gastroenterology;  Laterality: N/A;   DILITATION & CURRETTAGE/HYSTROSCOPY WITH HYDROTHERMAL ABLATION N/A 01/03/2016   Procedure: DILATATION & CURETTAGE/HYSTEROSCOPY WITH HYDROTHERMAL ABLATION;  Surgeon: [name], MD;  Location:  [facility];  Service: Gynecology;  Laterality: N/A;  HTA rep will be attend confirmed by office 12/12/15    NASAL SEPTOPLASTY W/ TURBINOPLASTY Bilateral 01/11/2021   Procedure: NASAL SEPTOPLASTY WITH TURBINATE REDUCTION;  Surgeon: [name], MD;  Location: [facility] OR;  Service: ENT;  Laterality: Bilateral;   TONSILLECTOMY      Social History   Tobacco Use   Smoking status: Never   Smokeless tobacco: Never  Vaping Use   Vaping status: Never Used  Substance Use Topics    Alcohol use: Not Currently    Alcohol/week: 0.0 standard drinks of alcohol    Comment: 1 drink per month   Drug use: No    Family History  Problem Relation Age of Onset   Sleep apnea Mother    Thyroid  disease Mother    Hypertension Mother    Diabetes Mother    Goiter Mother    Anxiety disorder Mother    Obesity Mother    Anxiety disorder Father    Hypertension Father    Diabetes Father    Asthma Father    Bipolar disorder Father    Depression Father    Diabetes Maternal Aunt    Lung cancer Paternal Grandmother        smoker-heavy   Stroke Paternal Grandfather    Colon cancer Neg Hx    Heart disease Neg Hx    Esophageal cancer Neg Hx    Rectal cancer Neg Hx    Stomach cancer Neg Hx     No Known Allergies  Medication list has been reviewed and updated.  Current Outpatient Medications on File Prior to Visit  Medication Sig Dispense Refill   albuterol  (VENTOLIN  HFA) 108 (90 Base) MCG/ACT inhaler Inhale 1-2 puffs into the lungs every 6 (six) hours as needed for wheezing or shortness of breath. 18 g 0   Blood Glucose Monitoring Suppl (BLOOD GLUCOSE METER KIT AND SUPPLIES) Dispense based on patient and insurance preference. Use up to four times daily as directed. (FOR ICD-9 250.00, 250.01). 1 each 0   Chlorophyll-Alfalfa 20-100 MG TABS Take 100 mg by mouth daily.     clotrimazole  (MYCELEX ) 10 MG troche Take 1 tablet (10 mg total) by mouth 5 (five) times daily. 35 Troche 0   Continuous Blood Gluc Sensor (FREESTYLE LIBRE 3 SENSOR) MISC APPLY SENSOR ON UPPER ARM EVERY 14 DAYS FOR CONTINUOUS GLUCOSE MONITORING 2 each 2   Continuous Blood Gluc Sensor (FREESTYLE LIBRE 3 SENSOR) MISC Use for continuous blood glucose monitoring 6 each 3   Continuous Glucose Sensor (FREESTYLE LIBRE 3 PLUS SENSOR) MISC Change sensor every 15 days. 6 each 3   glucose blood (ONETOUCH VERIO) test strip USE TO TEST BLOOD SUGAR ONCE DAILY 100 strip 3   lisinopril -hydrochlorothiazide  (ZESTORETIC ) 10-12.5 MG  tablet Take 1 tablet by mouth daily as needed (if BP elevates).     lisinopril -hydrochlorothiazide  (ZESTORETIC ) 20-25 MG tablet Take 1 tablet by mouth daily. 90 tablet 3   loratadine (CLARITIN) 10 MG tablet Take 10 mg by mouth daily.     Multiple Vitamin (MULTIVITAMIN) tablet Take 1 tablet by mouth daily.     Multiple Vitamins-Minerals (MULTIVITAMIN WITH MINERALS) tablet Take 1 tablet by mouth daily. With Diabetic Support     omega-3 acid ethyl esters (LOVAZA) 1 g capsule Take 2 g by mouth 2 (two) times daily.     OneTouch Delica Lancets 33G MISC Use once daily to check glucose. 100 each 1   Probiotic Product (PROBIOTIC-10 PO) Take 1  capsule by mouth daily. Takes Fortify Women's Digestive Probiotic     rosuvastatin  (CRESTOR ) 5 MG tablet Take 1 tablet (5 mg total) by mouth daily. 90 tablet 3   tirzepatide  (MOUNJARO ) 10 MG/0.5ML Pen Inject 10 mg into the skin once a week. 6 mL 3   VITAMIN D  PO Take 5,000 Units by mouth daily. WITH VITAMIN K     No current facility-administered medications on file prior to visit.    Review of Systems:  As per HPI- otherwise negative.   Physical Examination: There were no vitals filed for this visit. There were no vitals filed for this visit. There is no height or weight on file to calculate BMI. Ideal Body Weight:    GEN: no acute distress. HEENT: Atraumatic, Normocephalic.  Ears and Nose: No external deformity. CV: RRR, No M/G/R. No JVD. No thrill. No extra heart sounds. PULM: CTA B, no wheezes, crackles, rhonchi. No retractions. No resp. distress. No accessory muscle use. ABD: S, NT, ND, +BS. No rebound. No HSM. EXTR: No c/c/e PSYCH: Normally interactive. Conversant.    Assessment and Plan: No diagnosis found.  Assessment & Plan   Signed Harlene Schroeder, MD

## 2024-09-09 NOTE — Patient Instructions (Incomplete)
 It was great to see you again today!  I will be in touch with your labs asap and we will set up an MRA as well to look for any blood vessel issue in your head that could be causing the tinnitus Let's go up on Mounjaro  to 12.5 mg

## 2024-09-12 ENCOUNTER — Encounter: Payer: Self-pay | Admitting: Family Medicine

## 2024-09-12 ENCOUNTER — Ambulatory Visit: Admitting: Family Medicine

## 2024-09-12 ENCOUNTER — Encounter: Payer: Self-pay | Admitting: Rehabilitation

## 2024-09-12 ENCOUNTER — Ambulatory Visit: Admitting: Rehabilitation

## 2024-09-12 VITALS — BP 138/84 | HR 96 | Temp 98.1°F | Ht 71.5 in | Wt >= 6400 oz

## 2024-09-12 DIAGNOSIS — R293 Abnormal posture: Secondary | ICD-10-CM | POA: Diagnosis not present

## 2024-09-12 DIAGNOSIS — E041 Nontoxic single thyroid nodule: Secondary | ICD-10-CM

## 2024-09-12 DIAGNOSIS — M5459 Other low back pain: Secondary | ICD-10-CM | POA: Insufficient documentation

## 2024-09-12 DIAGNOSIS — I10 Essential (primary) hypertension: Secondary | ICD-10-CM

## 2024-09-12 DIAGNOSIS — G4733 Obstructive sleep apnea (adult) (pediatric): Secondary | ICD-10-CM

## 2024-09-12 DIAGNOSIS — L700 Acne vulgaris: Secondary | ICD-10-CM | POA: Diagnosis not present

## 2024-09-12 DIAGNOSIS — R61 Generalized hyperhidrosis: Secondary | ICD-10-CM

## 2024-09-12 DIAGNOSIS — Z7985 Long-term (current) use of injectable non-insulin antidiabetic drugs: Secondary | ICD-10-CM

## 2024-09-12 DIAGNOSIS — H93A1 Pulsatile tinnitus, right ear: Secondary | ICD-10-CM

## 2024-09-12 DIAGNOSIS — R262 Difficulty in walking, not elsewhere classified: Secondary | ICD-10-CM | POA: Insufficient documentation

## 2024-09-12 DIAGNOSIS — E1169 Type 2 diabetes mellitus with other specified complication: Secondary | ICD-10-CM

## 2024-09-12 DIAGNOSIS — M5432 Sciatica, left side: Secondary | ICD-10-CM | POA: Insufficient documentation

## 2024-09-12 LAB — BASIC METABOLIC PANEL WITH GFR
BUN: 15 mg/dL (ref 6–23)
CO2: 29 meq/L (ref 19–32)
Calcium: 10.2 mg/dL (ref 8.4–10.5)
Chloride: 103 meq/L (ref 96–112)
Creatinine, Ser: 0.91 mg/dL (ref 0.40–1.20)
GFR: 76.33 mL/min (ref >60.00)
Glucose, Bld: 130 mg/dL — ABNORMAL HIGH (ref 70–99)
Potassium: 3.9 meq/L (ref 3.5–5.1)
Sodium: 139 meq/L (ref 135–145)

## 2024-09-12 LAB — CBC
HCT: 40.2 % (ref 36.0–46.0)
Hemoglobin: 13.5 g/dL (ref 12.0–15.0)
MCHC: 33.6 g/dL (ref 30.0–36.0)
MCV: 89.1 fl (ref 78.0–100.0)
Platelets: 282 K/uL (ref 150.0–400.0)
RBC: 4.52 Mil/uL (ref 3.87–5.11)
RDW: 13.3 % (ref 11.5–15.5)
WBC: 8.5 K/uL (ref 4.0–10.5)

## 2024-09-12 LAB — TSH: TSH: 2.25 u[IU]/mL (ref 0.35–5.50)

## 2024-09-12 LAB — HEMOGLOBIN A1C: Hgb A1c MFr Bld: 5.9 % (ref 4.6–6.5)

## 2024-09-12 LAB — FOLLICLE STIMULATING HORMONE: FSH: 7.9 m[IU]/mL

## 2024-09-12 LAB — TESTOSTERONE: Testosterone: 54.31 ng/dL — ABNORMAL HIGH (ref 15.00–40.00)

## 2024-09-12 LAB — CORTISOL: Cortisol, Plasma: 5.5 ug/dL

## 2024-09-12 MED ORDER — TIRZEPATIDE 12.5 MG/0.5ML ~~LOC~~ SOAJ: 12.5000 mg | SUBCUTANEOUS | 1 refills | Status: AC

## 2024-09-12 NOTE — Therapy (Signed)
 OUTPATIENT PHYSICAL THERAPY THORACOLUMBAR TREATMENT   Patient Name: Angel Dawson MRN: [id number] DOB:1980-10-14, 43 y.o., female Today's Date: 09/12/2024  END OF SESSION:  PT End of Session - 09/12/24 0937     Visit Number 5    Date for Recertification  10/10/24    PT Start Time 0932    PT Stop Time 1033    PT Time Calculation (min) 61 min    Activity Tolerance Patient tolerated treatment well;No increased pain    Behavior During Therapy [facility] for tasks assessed/performed           Past Medical History:  Diagnosis Date   Abdominal pain    related to gallstones   Achilles tendonitis, bilateral    Back pain    Bronchitis    02/17   Chest pain    Constipation    Diabetes (HCC)    Type II   DKA (diabetic ketoacidoses) 08/25/2014   Gallstones    GERD (gastroesophageal reflux disease)    Headache    migraines   Hypertension    Joint pain    Lattice degeneration, both eyes    Palpitations    Pancreatitis    PCOS (polycystic ovarian syndrome)    Sinus congestion    occ. uses OTC meds for this   Sleep apnea    USES CPAP   Vitamin D  deficiency    Past Surgical History:  Procedure Laterality Date   CHOLECYSTECTOMY N/A 06/22/2013   Procedure: LAPAROSCOPIC CHOLECYSTECTOMY ;  Surgeon: Elon CHRISTELLA Pacini, MD;  Location: [facility];  Service: General;  Laterality: N/A;   COLONOSCOPY N/A 09/01/2024   Procedure: COLONOSCOPY;  Surgeon: Leigh Elspeth SQUIBB, MD;  Location: [facility];  Service: Gastroenterology;  Laterality: N/A;   DILITATION & CURRETTAGE/HYSTROSCOPY WITH HYDROTHERMAL ABLATION N/A 01/03/2016   Procedure: DILATATION & CURETTAGE/HYSTEROSCOPY WITH HYDROTHERMAL ABLATION;  Surgeon: [name], MD;  Location: [facility];  Service: Gynecology;  Laterality: N/A;  HTA rep will be attend confirmed by office 12/12/15    NASAL SEPTOPLASTY W/ TURBINOPLASTY Bilateral 01/11/2021   Procedure: NASAL SEPTOPLASTY WITH TURBINATE REDUCTION;  Surgeon: [name], MD;  Location: [facility]  OR;  Service: ENT;  Laterality: Bilateral;   TONSILLECTOMY     Patient Active Problem List   Diagnosis Date Noted   Benign neoplasm of colon 09/01/2024   Menorrhagia 01/20/2023   Insulin  resistance 12/01/2022   Other fatigue 11/13/2022   SOBOE (shortness of breath on exertion) 11/13/2022   Other chronic pain 11/13/2022   Other hyperlipidemia 11/13/2022   Depression 11/13/2022   Screening for colon cancer 11/13/2022   Type 2 diabetes mellitus with other specified complication (HCC) 08/19/2022   Essential hypertension 08/19/2022   Tinnitus of both ears 12/05/2021   Deviated septum 01/11/2021   Nasal turbinate hypertrophy 04/08/2018   Obstructive sleep apnea treated with continuous positive airway pressure (CPAP) 12/29/2017   Onychomycosis 10/26/2014   Gastroesophageal reflux disease without esophagitis 09/28/2014   morbid obesity with starting BMI 55 08/25/2014   Chronic rhinitis 06/30/2014   Pancreatitis, acute 07/02/2013   Status post laparoscopic cholecystectomy 06/24/2013   Gallstones 04/13/2013   Low back pain 08/09/2012   Vitamin D  deficiency 08/09/2012    PCP: [name], [name], MD   REFERRING PROVIDER: Emiliano Leonce CROME, PA*   REFERRING DIAG: [phone] (ICD-10-CM) - Chronic bilateral low back pain with left-sided sciatica  THERAPY DIAG:  Other low back pain  Sciatica, left side  Abnormal posture  Difficulty in walking, not elsewhere classified  RATIONALE  FOR EVALUATION AND TREATMENT: Rehabilitation  ONSET DATE:   NEXT MD VISIT:    SUBJECTIVE:                                                                                                                                                                                                         SUBJECTIVE STATEMENT: Patient states feeling a lot better today.  Rates back pain 1-2/10 max.  Did well over the holiday weekend   43 y/o patient referred to PT from orthopedics for acute on chronic LBP w/ L  sciatica.   She reports that she has a h/o L lower back pain with radiation into the L hip and thigh.  This started 18-20 yrs ago when she fell on her back in the bathtub.   However, 2-3 weeks ago she started having sharp sudden intense B back pain/spasm that felt like an electric jolt that would hit her for 5-10 seconds many times daily and then be gone.   She denies any trauma such as falling or any changes in activity level with lifting/travel/etc.   The common factor sees to be the pain occurs when she is in a standing position.   The pain doesn't occur when she is sitting or lying down.   Sleep is not disrupted.  Going up steps increases her general sore back pain.  She has to lead with the RLE going up steps due to LLE weaknes.    Sit to stand transfers can increase the pain.     PAIN: Are you having pain? Yes: NPRS scale: 5/10 now;  10/10 sudden/quick pain Pain location: low back Pain description: sharp and stabbing for 10-20 sec;  achy and radiating other time Aggravating factors: standing with back extension seems to precipitate Relieving factors: sitting/lying  PERTINENT HISTORY:  NIDDM 2, HTN, h/o pancreatitis, palpitations, B Achilles tendonitis  PRECAUTIONS: None  RED FLAGS: None  WEIGHT BEARING RESTRICTIONS: No  FALLS:  Has patient fallen in last 6 months? No  LIVING ENVIRONMENT: Lives with: lives alone Lives in: House/apartment Stairs: Yes: External: 3 steps; none Has following equipment at home: None  OCCUPATION: administrative desk work 7 hrs daily  PLOF: Independent  PATIENT GOALS: not hurt in the back   OBJECTIVE: (objective measures completed at initial evaluation unless otherwise dated)  DIAGNOSTIC FINDINGS:  EXAM: LUMBAR SPINE - COMPLETE 4+ VIEW   COMPARISON:  07/29/2021   FINDINGS: Five non-rib-bearing lumbar vertebra. Slight dextroscoliotic curvature at L4-L5. 8 mm grade 1 anterolisthesis of L4 on L5. No change in alignment on flexion or  extension. Mild L4-L5 disc space narrowing and spurring. Mild multilevel facet hypertrophy. No evidence of pars defects or focal bone abnormalities right upper quadrant surgical clips again seen.   IMPRESSION: 1. Grade 1 anterolisthesis of L4 on L5. No change in alignment on flexion or extension. 2. Mild L4-L5 degenerative disc disease. Mild multilevel facet hypertrophy.     Electronically Signed   By: Andrea Gasman M.D.   On: 07/25/2024 16:57EXAM: PELVIS - 1-2 VIEW   COMPARISON:  None Available.   FINDINGS: The hip joint spaces are preserved. Both femoral heads are well seated in the respective acetabula. No fracture. No evidence of erosion or avascular necrosis. The pubic symphysis and sacroiliac joints are congruent. Left pelvic phleboliths.   IMPRESSION: Negative radiograph of the pelvis.     Electronically Signed   By: Andrea Gasman M.D.   On: 07/25/2024 16:58  PATIENT SURVEYS:  Modified Oswestry:  MODIFIED OSWESTRY DISABILITY SCALE 09/12/24  Date: 09/12/2024 Score   Pain intensity 4 =  Pain medication provides me with little relief from pain. 2  2. Personal care (washing, dressing, etc.) 2 =  It is painful to take care of myself, and I am slow and careful. 0  3. Lifting 1 = I can lift heavy weights, but it causes increased pain. 1  4. Walking 2 =  Pain prevents me from walking more than  mile. 1  5. Sitting 1 =  I can only sit in my favorite chair as long as I like. 1  6. Standing 3 =  Pain prevents me from standing more than 1/2 hour. 2  7. Sleeping 2 =  Even when I take pain medication, I sleep less than 6 hours 1  8. Social Life 3 =  Pain prevents me from going out very often. 0  9. Traveling 3 = My pain restricts my travel over 1 hour 1  10. Employment/ Homemaking 1 = My normal homemaking/job activities increase my pain, but I can still perform all that is required of me 1  Total 22/50 10/50   Interpretation of scores: Score Category Description   0-20% Minimal Disability The patient can cope with most living activities. Usually no treatment is indicated apart from advice on lifting, sitting and exercise  21-40% Moderate Disability The patient experiences more pain and difficulty with sitting, lifting and standing. Travel and social life are more difficult and they may be disabled from work. Personal care, sexual activity and sleeping are not grossly affected, and the patient can usually be managed by conservative means  41-60% Severe Disability Pain remains the main problem in this group, but activities of daily living are affected. These patients require a detailed investigation  61-80% Crippled Back pain impinges on all aspects of the patient's life. Positive intervention is required  81-100% Bed-bound These patients are either bed-bound or exaggerating their symptoms  Bluford FORBES Zoe DELENA Karon DELENA, et al. Surgery versus conservative management of stable thoracolumbar fracture: the PRESTO feasibility RCT. [place] ([place]): [employer]; 2021 Nov. [facility] Technology Assessment, No. 25.62.) Appendix 3, Oswestry Disability Index category descriptors. Available from: [email or web address].cz  Minimally Clinically Important Difference (MCID) = 12.8%  SCREENING FOR RED FLAGS: Bowel or bladder incontinence: No Spinal tumors: No Cauda equina syndrome: No Compression fracture: No Abdominal aneurysm: No  COGNITION:  Overall cognitive status: Within functional limits for tasks assessed    SENSATION: WFL  POSTURE:  increased lumbar lordosis  LLE is longer in supine at the malleolar level  PALPATION: TTP over B lumbar paraspinals and L SI joint;   P/A lumbar vertebral glides are hypermobile feeling  LUMBAR ROM:   Active  Eval  Flexion Just below knees; no pain  Extension 100% ROM;  end range reproduces her sharp/brief intense pain  Right lateral flexion Below knee  Left lateral flexion Below knee   Right rotation 100%  Left rotation 100%  (Blank rows = not tested)  MUSCLE LENGTH: Hamstrings: Right SLR = 85 deg; Left SLR = 90+ deg Thomas test: Right  deg; Left  deg Hamstrings: not tight ITB: not tight Piriformis: moderately tight L and less so R Hip flexors: not tight Quads: not tight Heelcord: NT  LOWER EXTREMITY ROM:     Active  Right eval Left eval  Hip flexion    Hip extension    Hip abduction    Hip adduction    Hip internal rotation    Hip external rotation    Knee flexion    Knee extension    Ankle dorsiflexion    Ankle plantarflexion    Ankle inversion    Ankle eversion    (Blank rows = not tested)  LOWER EXTREMITY MMT:    MMT Right eval Left eval  Hip flexion    Hip extension    Hip abduction    Hip adduction    Hip internal rotation    Hip external rotation    Knee flexion    Knee extension    Ankle dorsiflexion    Ankle plantarflexion    Ankle inversion    Ankle eversion     (Blank rows = not tested)  LUMBAR SPECIAL TESTS:  Straight leg raise test: Negative, Slump test: Negative, SI Compression/distraction test: Negative, FABER test: Positive, and Gaenslen's test: Positive on the LLE Long sit test is long to short on the RLE indicating an anteriorly rotated inominate on the RLE vs. Posterior on LLE  FUNCTIONAL TESTS:  TBD  GAIT: Distance walked: into clinic Assistive device utilized: None Level of assistance: Complete Independence Gait pattern: supinates on the R, pronates on the LLE;  sway backed posture Comments:    TODAY'S TREATMENT:  09/12/24 THERAPEUTIC EXERCISE: To improve strength and endurance.  Demonstration, verbal and tactile cues throughout for technique. NuStep L5 x 7'  THERAPEUTIC ACTIVITIES: To improve functional performance.  Demonstration, verbal and tactile cues throughout for technique. MFI walkouts blue TB doubled x 6 laps each direction R lateral sideglides x 10 w/ 3 sec holds Standing hip flexor stretch x 1'  x 2 LLE Supine hip flexor stretch x 1' x 2 LLE  NEUROMUSCULAR RE-EDUCATION: To improve balance, coordination, kinesthesia, posture, and proprioception. Standing shoulder ext w/ PPT blue TB x 2/10 BUE Lateral UE slides blue TB doubled with PPT x 10 each direction  MANUAL THERAPY: To promote normalized muscle tension, improved flexibility, and reduced pain utilizing MET and percussion massage with massage gun. MET for L posterior inominate f/b pelvic shotgun x1 and Long leg traction pulls to LLE Theragun setting 2 to upper R hip, QL, paraspinals, and SI areas  MODALITIES: IFC to L low back and L posterior hip  x 15' at 50-56 mA intensity  09/05/24 THERAPEUTIC EXERCISE: To improve strength and endurance.  Demonstration, verbal and tactile cues throughout for technique. NuStep L4 x 7'  THERAPEUTIC ACTIVITIES: To improve functional performance.  Demonstration, verbal and tactile cues throughout for technique. MFI walkouts blue TB doubled x 6 laps each direction Lateral UE slides blue TB doubled  with straight trunk x 10 each direction R lateral sideglides x 10 w/ 3 sec holds Standing hip flexor stretch x 1' x 2 LLE Supine hip flexor stretch x 1' x 2 LLE  MANUAL THERAPY: To promote normalized muscle tension, improved flexibility, and reduced pain utilizing MET and percussion massage with massage gun. MET for L posterior inominate f/b pelvic shotgun x1 and Long leg traction pulls to LLE Theragun setting 2 to upper R hip, QL, paraspinals, and SI areas  MODALITIES: IFC to L low back and L posterior hip    08/29/24 THERAPEUTIC EXERCISE: To improve strength and endurance.  Demonstration, verbal and tactile cues throughout for technique. NuStep L4 x 7'  THERAPEUTIC ACTIVITIES: To improve functional performance.  Demonstration, verbal and tactile cues throughout for technique. Seated RDL w/ 15# from mat table x 20 Seated 65 cm swiss ball rollouts 11:00, 12:00, 1:00 x 20 each  direction  NEUROMUSCULAR RE-EDUCATION: To improve balance, coordination, kinesthesia, posture, and proprioception. Seated blue TB shoulder ext w/ hip flex x 2/10 BUE/BLE Attempted blue TB PNF extension/chop but increased LBP Seated palloff press Blue TB doubled x 2/10 MFI walkouts blue TB doubled w/ PPT x 2/10 each direction  MODALITIES: IFC to L low back and L posterior hip      08/18/24 NEUROMUSCULAR RE-EDUCATION: To improve coordination, kinesthesia, posture, and proprioception.  Supine PPT 10x3' Bridges with PPT 2x10 Supine curl ups 2x10; diagonal curl ups 2x10 Seated jefferson curl blue wt ball 2 x 10 Standing jefferson curl blue wt ball x 10 Standing lat pull + march RTB x 10  THERAPEUTIC EXERCISE: To improve strength, endurance, ROM, and flexibility.  Supine piriformis stretch opp leg straight 2 x 1 min B Quadruped cat 10x3' Seated lumbar flex stretch green pball x60min  08/15/24 SELF CARE: Provided education on PT POC progression; initial HEP;  postural correction w/ PPT throughout the day;  avoiding lumbar extension   PATIENT EDUCATION:  Education details: HEP review and HEP update  Person educated: Patient Education method: Explanation, Verbal cues, Tactile cues, Handouts, and MedBridgeGO app access provided Education comprehension: verbalized understanding, verbal cues required, tactile cues required, and needs further education  HOME EXERCISE PROGRAM: Access Code: [email or web address] URL: [email or web address] Date: 09/05/2024 Prepared by: Garnette Montclair  Exercises - Supine Posterior Pelvic Tilt  - 1 x daily - 7 x weekly - 3 sets - 10 reps - Quadruped Cat with Posterior Pelvic Tilt  - 1 x daily - 7 x weekly - 3 sets - 10 reps - Curl Up with Reach  - 1 x daily - 7 x weekly - 3 sets - 10 reps - Diagonal Curl Up with Reach  - 1 x daily - 7 x weekly - 3 sets - 10 reps - Supine Piriformis Stretch with Leg Straight  - 1 x daily - 7 x weekly - 1 sets - 2 reps -  1 min hold - Seated Flexion Stretch with Swiss Ball  - 1 x daily - 7 x weekly - 1 sets - 3 reps - 1 min hold - Seated Anti-Rotation Press With Anchored Resistance  - 1 x daily - 7 x weekly - 3 sets - 10 reps - Anti-Rotation Sidestepping with Resistance  - 1 x daily - 7 x weekly - 3 sets - 10 reps - Right Standing Lateral Shift Correction at Wall - Repetitions  - 1 x daily - 7 x weekly - 1 sets - 10 reps - 2 -3 sec hold - Hip Flexor  Stretch at [facility] of Bed  - 1 x daily - 7 x weekly - 1 sets - 2 reps - 1 min hold - Standing Hip Flexor Stretch  - 1 x daily - 7 x weekly - 1 sets - 2 reps - min hold ASSESSMENT:  CLINICAL IMPRESSION: Patient had good relief from last visit so modalities and MFR were provided again today.   Her exercises are progressed to incorporate posterior pelvic tilt to standing routine.   She is progressing to goals.   Still has remaining pain over the L quadratus, SI, and paraspinal areas so this needs to continue to be addressed.   PT remains necessary for pain, strength, stabilization deficits in the core and lumbar spine.  Continue per POC  EVAL: Angel Dawson is a 43 y.o. female who was referred to physical therapy for evaluation and treatment for acute on chronic LBP w/ sciatica.   Patient reports onset of LBP pain beginning 2-3 weeks ago for no apparent reason. She has some chronic L low back pain with sciatica into the LLE, but reports this is normally just soreness/achiness.    The pain she is getting now is sudden, sharp, severe and only last 5-10 seconds, but it nearly brings her to her knees.    Pain is worse with with standing activities.   She does not normally get the pain with sitting or lying down.  Her posture is significantly accentuated lumbar lordosis and she reproduces the sharp pain with end range lumbar extension.   She also has piriformis tightness, hip weakness, and tenderness to palpate. Her LLE is a little longer than the RLE.   She is generally hyperflexible  and hypermobile.   MRI shows some spondylolisthesis of L4 on L5 with DDD changes.    Patient has deficits in  hip flexibility, BLE strength, abnormal posture, and TTP with pain in the low back and hips which are interfering with ADLs and are impacting quality of life.  On Modified Oswestry patient scored 22/50 demonstrating 44% or moderate disability.  Jodeci will benefit from skilled PT to address above deficits to improve mobility and activity tolerance with decreased pain interference.     OBJECTIVE IMPAIRMENTS: difficulty walking, decreased strength, postural dysfunction, and pain.   ACTIVITY LIMITATIONS: carrying, standing, stairs, and locomotion level  PARTICIPATION LIMITATIONS: meal prep, cleaning, laundry, shopping, and community activity  PERSONAL FACTORS: Fitness, Time since onset of injury/illness/exacerbation, and 1-2 comorbidities: NIDDM 2, HTN, h/o pancreatitis, palpitations are also affecting patient's functional outcome.   REHAB POTENTIAL: Good  CLINICAL DECISION MAKING: Evolving/moderate complexity  EVALUATION COMPLEXITY: Moderate   GOALS: Goals reviewed with patient? Yes  SHORT TERM GOALS: Target date: 09/12/2024    Patient will be independent with initial HEP to improve outcomes and carryover.  Baseline: 100% PT assist required for correct completion Goal status: MET- 08/29/24  2.  Patient will report 25% improvement in low back pain to improve QOL. Baseline: 10/10 worst 08/29/24:  6.5/10 Goal status: MET   LONG TERM GOALS: Target date: 10/11/2024   Patient will be independent with ongoing/advanced HEP for self-management at home.  Baseline: no advanced HEP yet 08/29/24:  medbridge updated Goal status: IN PROGRESS  2.  Patient will report 50-75% improvement in low back pain to improve QOL.  Baseline: 10/10 worst 09/05/24:  4/10 today 09/12/24:  2/10 Goal status: IN PROGRESS  3.  Patient to demonstrate ability to achieve and maintain good spinal  alignment/posturing and body mechanics needed for daily  activities. Baseline: significant lumbar lordosis 09/12/24:  progressing with posterior pelvic tilt in standing  Goal status: IN PROGRESS  4.  Patient will demonstrate full pain free lumbar ROM to perform ADLs.   Baseline: Refer to above lumbar ROM table Goal status: INITIAL  5.  Patient will demonstrate improved BLE strength to >/= 5/5 for improved stability and ease of mobility. Baseline: Refer to above LE MMT table Goal status: INITIAL  6. Patient will report </= 30% on Modified Oswestry (MCID = 12%) to demonstrate improved functional ability with decreased pain interference. Baseline: 44% 09/12/24:  20% Goal status: MET   PLAN:  PT FREQUENCY: 1-2x/week  PT DURATION: 8 weeks  PLANNED INTERVENTIONS: 97164- PT Re-evaluation, 97110-Therapeutic exercises, 97530- Therapeutic activity, 97112- Neuromuscular re-education, 97535- Self Care, 02859- Manual therapy, [id number]- Electrical stimulation (unattended), 97016- Vasopneumatic device, [id number]- Ultrasound, [id number]- Traction (mechanical), [id number]- Ionotophoresis 4mg /ml Dexamethasone , 79439 (1-2 muscles), 20561 (3+ muscles)- Dry Needling, Patient/Family education, Taping, Joint mobilization, Spinal mobilization, Cryotherapy, and Moist heat  PLAN FOR NEXT SESSION: Check ROM and strength,  Continue with lumbar stabilization with posterior pelvic tilt, quadruped activities, continue with MFR and estim since these are providing good relief for her pain.  Shannon Balthazar, PT 09/12/2024, 8:18 PM

## 2024-09-12 NOTE — Addendum Note (Signed)
 Addended by: ROSEBUD NEST A on: 09/12/2024 09:05 AM   Modules accepted: Orders

## 2024-09-15 ENCOUNTER — Ambulatory Visit

## 2024-09-16 ENCOUNTER — Ambulatory Visit

## 2024-09-16 DIAGNOSIS — R262 Difficulty in walking, not elsewhere classified: Secondary | ICD-10-CM | POA: Diagnosis not present

## 2024-09-16 DIAGNOSIS — R293 Abnormal posture: Secondary | ICD-10-CM | POA: Diagnosis not present

## 2024-09-16 DIAGNOSIS — M5432 Sciatica, left side: Secondary | ICD-10-CM | POA: Diagnosis not present

## 2024-09-16 DIAGNOSIS — G4733 Obstructive sleep apnea (adult) (pediatric): Secondary | ICD-10-CM | POA: Diagnosis not present

## 2024-09-16 DIAGNOSIS — M5459 Other low back pain: Secondary | ICD-10-CM

## 2024-09-16 NOTE — Therapy (Signed)
 OUTPATIENT PHYSICAL THERAPY THORACOLUMBAR TREATMENT   Patient Name: Angel Dawson MRN: [id number] DOB:December 17, 1980, 43 y.o., female Today's Date: 09/16/2024  END OF SESSION:  PT End of Session - 09/16/24 0938     Visit Number 6    Date for Recertification  10/10/24    PT Start Time 0852    PT Stop Time 0936    PT Time Calculation (min) 44 min    Activity Tolerance Patient tolerated treatment well;No increased pain    Behavior During Therapy [facility] for tasks assessed/performed            Past Medical History:  Diagnosis Date   Abdominal pain    related to gallstones   Achilles tendonitis, bilateral    Back pain    Bronchitis    02/17   Chest pain    Constipation    Diabetes (HCC)    Type II   DKA (diabetic ketoacidoses) 08/25/2014   Gallstones    GERD (gastroesophageal reflux disease)    Headache    migraines   Hypertension    Joint pain    Lattice degeneration, both eyes    Palpitations    Pancreatitis    PCOS (polycystic ovarian syndrome)    Sinus congestion    occ. uses OTC meds for this   Sleep apnea    USES CPAP   Vitamin D  deficiency    Past Surgical History:  Procedure Laterality Date   CHOLECYSTECTOMY N/A 06/22/2013   Procedure: LAPAROSCOPIC CHOLECYSTECTOMY ;  Surgeon: Elon CHRISTELLA Pacini, MD;  Location: [facility];  Service: General;  Laterality: N/A;   COLONOSCOPY N/A 09/01/2024   Procedure: COLONOSCOPY;  Surgeon: Leigh Elspeth SQUIBB, MD;  Location: [facility];  Service: Gastroenterology;  Laterality: N/A;   DILITATION & CURRETTAGE/HYSTROSCOPY WITH HYDROTHERMAL ABLATION N/A 01/03/2016   Procedure: DILATATION & CURETTAGE/HYSTEROSCOPY WITH HYDROTHERMAL ABLATION;  Surgeon: [name], MD;  Location: [facility];  Service: Gynecology;  Laterality: N/A;  HTA rep will be attend confirmed by office 12/12/15    NASAL SEPTOPLASTY W/ TURBINOPLASTY Bilateral 01/11/2021   Procedure: NASAL SEPTOPLASTY WITH TURBINATE REDUCTION;  Surgeon: [name], MD;  Location:  [facility] OR;  Service: ENT;  Laterality: Bilateral;   TONSILLECTOMY     Patient Active Problem List   Diagnosis Date Noted   Benign neoplasm of colon 09/01/2024   Menorrhagia 01/20/2023   Insulin  resistance 12/01/2022   Other fatigue 11/13/2022   SOBOE (shortness of breath on exertion) 11/13/2022   Other chronic pain 11/13/2022   Other hyperlipidemia 11/13/2022   Depression 11/13/2022   Screening for colon cancer 11/13/2022   Type 2 diabetes mellitus with other specified complication (HCC) 08/19/2022   Essential hypertension 08/19/2022   Tinnitus of both ears 12/05/2021   Deviated septum 01/11/2021   Nasal turbinate hypertrophy 04/08/2018   Obstructive sleep apnea treated with continuous positive airway pressure (CPAP) 12/29/2017   Onychomycosis 10/26/2014   Gastroesophageal reflux disease without esophagitis 09/28/2014   morbid obesity with starting BMI 55 08/25/2014   Chronic rhinitis 06/30/2014   Pancreatitis, acute 07/02/2013   Status post laparoscopic cholecystectomy 06/24/2013   Gallstones 04/13/2013   Low back pain 08/09/2012   Vitamin D  deficiency 08/09/2012    PCP: [name], [name], MD   REFERRING PROVIDER: Emiliano Leonce CROME, PA*   REFERRING DIAG: [phone] (ICD-10-CM) - Chronic bilateral low back pain with left-sided sciatica  THERAPY DIAG:  Other low back pain  Sciatica, left side  Abnormal posture  Difficulty in walking, not elsewhere classified  RATIONALE FOR EVALUATION AND TREATMENT: Rehabilitation  ONSET DATE:   NEXT MD VISIT:    SUBJECTIVE:                                                                                                                                                                                                         SUBJECTIVE STATEMENT: More R knee pain today  43 y/o patient referred to PT from orthopedics for acute on chronic LBP w/ L sciatica.   She reports that she has a h/o L lower back pain with radiation into the  L hip and thigh.  This started 18-20 yrs ago when she fell on her back in the bathtub.   However, 2-3 weeks ago she started having sharp sudden intense B back pain/spasm that felt like an electric jolt that would hit her for 5-10 seconds many times daily and then be gone.   She denies any trauma such as falling or any changes in activity level with lifting/travel/etc.   The common factor sees to be the pain occurs when she is in a standing position.   The pain doesn't occur when she is sitting or lying down.   Sleep is not disrupted.  Going up steps increases her general sore back pain.  She has to lead with the RLE going up steps due to LLE weaknes.    Sit to stand transfers can increase the pain.     PAIN: Are you having pain? Yes: NPRS scale: 5/10 now;  10/10 sudden/quick pain Pain location: low back Pain description: sharp and stabbing for 10-20 sec;  achy and radiating other time Aggravating factors: standing with back extension seems to precipitate Relieving factors: sitting/lying  PERTINENT HISTORY:  NIDDM 2, HTN, h/o pancreatitis, palpitations, B Achilles tendonitis  PRECAUTIONS: None  RED FLAGS: None  WEIGHT BEARING RESTRICTIONS: No  FALLS:  Has patient fallen in last 6 months? No  LIVING ENVIRONMENT: Lives with: lives alone Lives in: House/apartment Stairs: Yes: External: 3 steps; none Has following equipment at home: None  OCCUPATION: administrative desk work 7 hrs daily  PLOF: Independent  PATIENT GOALS: not hurt in the back   OBJECTIVE: (objective measures completed at initial evaluation unless otherwise dated)  DIAGNOSTIC FINDINGS:  EXAM: LUMBAR SPINE - COMPLETE 4+ VIEW   COMPARISON:  07/29/2021   FINDINGS: Five non-rib-bearing lumbar vertebra. Slight dextroscoliotic curvature at L4-L5. 8 mm grade 1 anterolisthesis of L4 on L5. No change in alignment on flexion or extension. Mild L4-L5 disc space narrowing and spurring. Mild multilevel facet hypertrophy.  No evidence  of pars defects or focal bone abnormalities right upper quadrant surgical clips again seen.   IMPRESSION: 1. Grade 1 anterolisthesis of L4 on L5. No change in alignment on flexion or extension. 2. Mild L4-L5 degenerative disc disease. Mild multilevel facet hypertrophy.     Electronically Signed   By: Andrea Gasman M.D.   On: 07/25/2024 16:57EXAM: PELVIS - 1-2 VIEW   COMPARISON:  None Available.   FINDINGS: The hip joint spaces are preserved. Both femoral heads are well seated in the respective acetabula. No fracture. No evidence of erosion or avascular necrosis. The pubic symphysis and sacroiliac joints are congruent. Left pelvic phleboliths.   IMPRESSION: Negative radiograph of the pelvis.     Electronically Signed   By: Andrea Gasman M.D.   On: 07/25/2024 16:58  PATIENT SURVEYS:  Modified Oswestry:  MODIFIED OSWESTRY DISABILITY SCALE 09/12/24  Date: 09/16/2024 Score   Pain intensity 4 =  Pain medication provides me with little relief from pain. 2  2. Personal care (washing, dressing, etc.) 2 =  It is painful to take care of myself, and I am slow and careful. 0  3. Lifting 1 = I can lift heavy weights, but it causes increased pain. 1  4. Walking 2 =  Pain prevents me from walking more than  mile. 1  5. Sitting 1 =  I can only sit in my favorite chair as long as I like. 1  6. Standing 3 =  Pain prevents me from standing more than 1/2 hour. 2  7. Sleeping 2 =  Even when I take pain medication, I sleep less than 6 hours 1  8. Social Life 3 =  Pain prevents me from going out very often. 0  9. Traveling 3 = My pain restricts my travel over 1 hour 1  10. Employment/ Homemaking 1 = My normal homemaking/job activities increase my pain, but I can still perform all that is required of me 1  Total 22/50 10/50   Interpretation of scores: Score Category Description  0-20% Minimal Disability The patient can cope with most living activities. Usually no treatment  is indicated apart from advice on lifting, sitting and exercise  21-40% Moderate Disability The patient experiences more pain and difficulty with sitting, lifting and standing. Travel and social life are more difficult and they may be disabled from work. Personal care, sexual activity and sleeping are not grossly affected, and the patient can usually be managed by conservative means  41-60% Severe Disability Pain remains the main problem in this group, but activities of daily living are affected. These patients require a detailed investigation  61-80% Crippled Back pain impinges on all aspects of the patient's life. Positive intervention is required  81-100% Bed-bound These patients are either bed-bound or exaggerating their symptoms  Bluford FORBES Zoe DELENA Karon DELENA, et al. Surgery versus conservative management of stable thoracolumbar fracture: the PRESTO feasibility RCT. [place] ([place]): [employer]; 2021 Nov. [facility] Technology Assessment, No. 25.62.) Appendix 3, Oswestry Disability Index category descriptors. Available from: Findjewelers.cz  Minimally Clinically Important Difference (MCID) = 12.8%  SCREENING FOR RED FLAGS: Bowel or bladder incontinence: No Spinal tumors: No Cauda equina syndrome: No Compression fracture: No Abdominal aneurysm: No  COGNITION:  Overall cognitive status: Within functional limits for tasks assessed    SENSATION: WFL  POSTURE:  increased lumbar lordosis  LLE is longer in supine at the malleolar level  PALPATION: TTP over B lumbar paraspinals and L SI joint;   P/A lumbar  vertebral glides are hypermobile feeling  LUMBAR ROM:   Active  Eval 09/16/24  Flexion Just below knees; no pain ~2 inch from floor  Extension 100% ROM;  end range reproduces her sharp/brief intense pain full  Right lateral flexion Below knee full  Left lateral flexion Below knee full  Right rotation 100% full  Left rotation 100% full  (Blank  rows = not tested)  MUSCLE LENGTH: Hamstrings: Right SLR = 85 deg; Left SLR = 90+ deg Thomas test: Right  deg; Left  deg Hamstrings: not tight ITB: not tight Piriformis: moderately tight L and less so R Hip flexors: not tight Quads: not tight Heelcord: NT  LOWER EXTREMITY ROM:     Active  Right eval Left eval  Hip flexion    Hip extension    Hip abduction    Hip adduction    Hip internal rotation    Hip external rotation    Knee flexion    Knee extension    Ankle dorsiflexion    Ankle plantarflexion    Ankle inversion    Ankle eversion    (Blank rows = not tested)  LOWER EXTREMITY MMT:    MMT Right eval Left eval  Hip flexion    Hip extension    Hip abduction    Hip adduction    Hip internal rotation    Hip external rotation    Knee flexion    Knee extension    Ankle dorsiflexion    Ankle plantarflexion    Ankle inversion    Ankle eversion     (Blank rows = not tested)  LUMBAR SPECIAL TESTS:  Straight leg raise test: Negative, Slump test: Negative, SI Compression/distraction test: Negative, FABER test: Positive, and Gaenslen's test: Positive on the LLE Long sit test is long to short on the RLE indicating an anteriorly rotated inominate on the RLE vs. Posterior on LLE  FUNCTIONAL TESTS:  TBD  GAIT: Distance walked: into clinic Assistive device utilized: None Level of assistance: Complete Independence Gait pattern: supinates on the R, pronates on the LLE;  sway backed posture Comments:    TODAY'S TREATMENT:  09/16/24 THERAPEUTIC EXERCISE: To improve strength and endurance.  Demonstration, verbal and tactile cues throughout for technique. NuStep L5 x 7' Rechecked ROM  NEUROMUSCULAR RE-EDUCATION: To improve balance, coordination, kinesthesia, posture, and proprioception. Standing hip abduction GTB at ankles 2 x 10 B Standing hip EXT GTB at ankles 2 x 10 B Sidestepping GTB At ankles 4x15 ft Standing cable rows 35lb 2x10  Multifidus walkout 15lb 2x5  leading with L; 10lb leading with R side   09/12/24 THERAPEUTIC EXERCISE: To improve strength and endurance.  Demonstration, verbal and tactile cues throughout for technique. NuStep L5 x 7'  THERAPEUTIC ACTIVITIES: To improve functional performance.  Demonstration, verbal and tactile cues throughout for technique. MFI walkouts blue TB doubled x 6 laps each direction R lateral sideglides x 10 w/ 3 sec holds Standing hip flexor stretch x 1' x 2 LLE Supine hip flexor stretch x 1' x 2 LLE  NEUROMUSCULAR RE-EDUCATION: To improve balance, coordination, kinesthesia, posture, and proprioception. Standing shoulder ext w/ PPT blue TB x 2/10 BUE Lateral UE slides blue TB doubled with PPT x 10 each direction  MANUAL THERAPY: To promote normalized muscle tension, improved flexibility, and reduced pain utilizing MET and percussion massage with massage gun. MET for L posterior inominate f/b pelvic shotgun x1 and Long leg traction pulls to LLE Theragun setting 2 to upper R hip, QL,  paraspinals, and SI areas  MODALITIES: IFC to L low back and L posterior hip  x 15' at 50-56 mA intensity  09/05/24 THERAPEUTIC EXERCISE: To improve strength and endurance.  Demonstration, verbal and tactile cues throughout for technique. NuStep L4 x 7'  THERAPEUTIC ACTIVITIES: To improve functional performance.  Demonstration, verbal and tactile cues throughout for technique. MFI walkouts blue TB doubled x 6 laps each direction Lateral UE slides blue TB doubled with straight trunk x 10 each direction R lateral sideglides x 10 w/ 3 sec holds Standing hip flexor stretch x 1' x 2 LLE Supine hip flexor stretch x 1' x 2 LLE  MANUAL THERAPY: To promote normalized muscle tension, improved flexibility, and reduced pain utilizing MET and percussion massage with massage gun. MET for L posterior inominate f/b pelvic shotgun x1 and Long leg traction pulls to LLE Theragun setting 2 to upper R hip, QL, paraspinals, and SI  areas  MODALITIES: IFC to L low back and L posterior hip    08/29/24 THERAPEUTIC EXERCISE: To improve strength and endurance.  Demonstration, verbal and tactile cues throughout for technique. NuStep L4 x 7'  THERAPEUTIC ACTIVITIES: To improve functional performance.  Demonstration, verbal and tactile cues throughout for technique. Seated RDL w/ 15# from mat table x 20 Seated 65 cm swiss ball rollouts 11:00, 12:00, 1:00 x 20 each direction  NEUROMUSCULAR RE-EDUCATION: To improve balance, coordination, kinesthesia, posture, and proprioception. Seated blue TB shoulder ext w/ hip flex x 2/10 BUE/BLE Attempted blue TB PNF extension/chop but increased LBP Seated palloff press Blue TB doubled x 2/10 MFI walkouts blue TB doubled w/ PPT x 2/10 each direction  MODALITIES: IFC to L low back and L posterior hip      08/18/24 NEUROMUSCULAR RE-EDUCATION: To improve coordination, kinesthesia, posture, and proprioception.  Supine PPT 10x3' Bridges with PPT 2x10 Supine curl ups 2x10; diagonal curl ups 2x10 Seated jefferson curl blue wt ball 2 x 10 Standing jefferson curl blue wt ball x 10 Standing lat pull + march RTB x 10  THERAPEUTIC EXERCISE: To improve strength, endurance, ROM, and flexibility.  Supine piriformis stretch opp leg straight 2 x 1 min B Quadruped cat 10x3' Seated lumbar flex stretch green pball x69min  08/15/24 SELF CARE: Provided education on PT POC progression; initial HEP;  postural correction w/ PPT throughout the day;  avoiding lumbar extension   PATIENT EDUCATION:  Education details: HEP review and HEP update  Person educated: Patient Education method: Explanation, Verbal cues, Tactile cues, Handouts, and MedBridgeGO app access provided Education comprehension: verbalized understanding, verbal cues required, tactile cues required, and needs further education  HOME EXERCISE PROGRAM: Access Code: [email or web address] URL: [email or web address] Date:  09/05/2024 Prepared by: Garnette Montclair  Exercises - Supine Posterior Pelvic Tilt  - 1 x daily - 7 x weekly - 3 sets - 10 reps - Quadruped Cat with Posterior Pelvic Tilt  - 1 x daily - 7 x weekly - 3 sets - 10 reps - Curl Up with Reach  - 1 x daily - 7 x weekly - 3 sets - 10 reps - Diagonal Curl Up with Reach  - 1 x daily - 7 x weekly - 3 sets - 10 reps - Supine Piriformis Stretch with Leg Straight  - 1 x daily - 7 x weekly - 1 sets - 2 reps - 1 min hold - Seated Flexion Stretch with Swiss Ball  - 1 x daily - 7 x weekly - 1 sets - 3 reps - 1 min  hold - Seated Anti-Rotation Press With Anchored Resistance  - 1 x daily - 7 x weekly - 3 sets - 10 reps - Anti-Rotation Sidestepping with Resistance  - 1 x daily - 7 x weekly - 3 sets - 10 reps - Right Standing Lateral Shift Correction at Wall - Repetitions  - 1 x daily - 7 x weekly - 1 sets - 10 reps - 2 -3 sec hold - Hip Flexor Stretch at Edge of Bed  - 1 x daily - 7 x weekly - 1 sets - 2 reps - 1 min hold - Standing Hip Flexor Stretch  - 1 x daily - 7 x weekly - 1 sets - 2 reps - min hold  ASSESSMENT:  CLINICAL IMPRESSION: Advanced through core stabilization exercises and strengthening for LE as tolerated. Lumbar AROM recheck, she shoes some hypermobility with lumbar motions. Would suggest to really focus on continuing to strengthen core for ligamentous and spinal stability. PT remains necessary for pain, strength, stabilization deficits in the core and lumbar spine.  Continue per POC  EVAL: Angel Dawson is a 43 y.o. female who was referred to physical therapy for evaluation and treatment for acute on chronic LBP w/ sciatica.   Patient reports onset of LBP pain beginning 2-3 weeks ago for no apparent reason. She has some chronic L low back pain with sciatica into the LLE, but reports this is normally just soreness/achiness.    The pain she is getting now is sudden, sharp, severe and only last 5-10 seconds, but it nearly brings her to her knees.     Pain is worse with with standing activities.   She does not normally get the pain with sitting or lying down.  Her posture is significantly accentuated lumbar lordosis and she reproduces the sharp pain with end range lumbar extension.   She also has piriformis tightness, hip weakness, and tenderness to palpate. Her LLE is a little longer than the RLE.   She is generally hyperflexible and hypermobile.   MRI shows some spondylolisthesis of L4 on L5 with DDD changes.    Patient has deficits in  hip flexibility, BLE strength, abnormal posture, and TTP with pain in the low back and hips which are interfering with ADLs and are impacting quality of life.  On Modified Oswestry patient scored 22/50 demonstrating 44% or moderate disability.  Angel Dawson will benefit from skilled PT to address above deficits to improve mobility and activity tolerance with decreased pain interference.     OBJECTIVE IMPAIRMENTS: difficulty walking, decreased strength, postural dysfunction, and pain.   ACTIVITY LIMITATIONS: carrying, standing, stairs, and locomotion level  PARTICIPATION LIMITATIONS: meal prep, cleaning, laundry, shopping, and community activity  PERSONAL FACTORS: Fitness, Time since onset of injury/illness/exacerbation, and 1-2 comorbidities: NIDDM 2, HTN, h/o pancreatitis, palpitations are also affecting patient's functional outcome.   REHAB POTENTIAL: Good  CLINICAL DECISION MAKING: Evolving/moderate complexity  EVALUATION COMPLEXITY: Moderate   GOALS: Goals reviewed with patient? Yes  SHORT TERM GOALS: Target date: 09/12/2024    Patient will be independent with initial HEP to improve outcomes and carryover.  Baseline: 100% PT assist required for correct completion Goal status: MET- 08/29/24  2.  Patient will report 25% improvement in low back pain to improve QOL. Baseline: 10/10 worst 08/29/24:  6.5/10 Goal status: MET   LONG TERM GOALS: Target date: 10/11/2024   Patient will be independent  with ongoing/advanced HEP for self-management at home.  Baseline: no advanced HEP yet 08/29/24:  medbridge updated Goal  status: IN PROGRESS- met for current 09/16/24  2.  Patient will report 50-75% improvement in low back pain to improve QOL.  Baseline: 10/10 worst 09/05/24:  4/10 today 09/12/24:  2/10 Goal status: IN PROGRESS  3.  Patient to demonstrate ability to achieve and maintain good spinal alignment/posturing and body mechanics needed for daily activities. Baseline: significant lumbar lordosis 09/12/24:  progressing with posterior pelvic tilt in standing  Goal status: IN PROGRESS  4.  Patient will demonstrate full pain free lumbar ROM to perform ADLs.   Baseline: Refer to above lumbar ROM table Goal status: IN PROGRESS- 09/16/24 see table  5.  Patient will demonstrate improved BLE strength to >/= 5/5 for improved stability and ease of mobility. Baseline: Refer to above LE MMT table Goal status: INITIAL  6. Patient will report </= 30% on Modified Oswestry (MCID = 12%) to demonstrate improved functional ability with decreased pain interference. Baseline: 44% 09/12/24:  20% Goal status: MET   PLAN:  PT FREQUENCY: 1-2x/week  PT DURATION: 8 weeks  PLANNED INTERVENTIONS: 97164- PT Re-evaluation, 97110-Therapeutic exercises, 97530- Therapeutic activity, 97112- Neuromuscular re-education, 97535- Self Care, 02859- Manual therapy, G0283- Electrical stimulation (unattended), 97016- Vasopneumatic device, [id number]- Ultrasound, [id number]- Traction (mechanical), [id number]- Ionotophoresis 4mg /ml Dexamethasone , 79439 (1-2 muscles), 20561 (3+ muscles)- Dry Needling, Patient/Family education, Taping, Joint mobilization, Spinal mobilization, Cryotherapy, and Moist heat  PLAN FOR NEXT SESSION: Check strength,  Continue with lumbar stabilization with posterior pelvic tilt, quadruped activities, continue with MFR and estim since these are providing good relief for her pain.  Aften Lipsey L Esmae Donathan, PTA 09/16/2024,  9:52 AM

## 2024-09-19 ENCOUNTER — Ambulatory Visit: Admitting: Physical Therapy

## 2024-09-19 ENCOUNTER — Encounter: Payer: Self-pay | Admitting: Physical Therapy

## 2024-09-19 DIAGNOSIS — M5432 Sciatica, left side: Secondary | ICD-10-CM

## 2024-09-19 DIAGNOSIS — R293 Abnormal posture: Secondary | ICD-10-CM | POA: Diagnosis not present

## 2024-09-19 DIAGNOSIS — M5459 Other low back pain: Secondary | ICD-10-CM

## 2024-09-19 DIAGNOSIS — R262 Difficulty in walking, not elsewhere classified: Secondary | ICD-10-CM

## 2024-09-19 NOTE — Therapy (Signed)
 OUTPATIENT PHYSICAL THERAPY THORACOLUMBAR TREATMENT   Patient Name: Angel Dawson MRN: [id number] DOB:05-20-1981, 43 y.o., female Today's Date: 09/19/2024  END OF SESSION:  PT End of Session - 09/19/24 0804     Visit Number 7    Date for Recertification  10/10/24    Authorization Type BCBS    PT Start Time 0804    PT Stop Time 0845    PT Time Calculation (min) 41 min    Activity Tolerance Patient tolerated treatment well;No increased pain    Behavior During Therapy [facility] for tasks assessed/performed             Past Medical History:  Diagnosis Date   Abdominal pain    related to gallstones   Achilles tendonitis, bilateral    Back pain    Bronchitis    02/17   Chest pain    Constipation    Diabetes (HCC)    Type II   DKA (diabetic ketoacidoses) 08/25/2014   Gallstones    GERD (gastroesophageal reflux disease)    Headache    migraines   Hypertension    Joint pain    Lattice degeneration, both eyes    Palpitations    Pancreatitis    PCOS (polycystic ovarian syndrome)    Sinus congestion    occ. uses OTC meds for this   Sleep apnea    USES CPAP   Vitamin D  deficiency    Past Surgical History:  Procedure Laterality Date   CHOLECYSTECTOMY N/A 06/22/2013   Procedure: LAPAROSCOPIC CHOLECYSTECTOMY ;  Surgeon: Elon CHRISTELLA Pacini, MD;  Location: [facility];  Service: General;  Laterality: N/A;   COLONOSCOPY N/A 09/01/2024   Procedure: COLONOSCOPY;  Surgeon: Leigh Elspeth SQUIBB, MD;  Location: [facility];  Service: Gastroenterology;  Laterality: N/A;   DILITATION & CURRETTAGE/HYSTROSCOPY WITH HYDROTHERMAL ABLATION N/A 01/03/2016   Procedure: DILATATION & CURETTAGE/HYSTEROSCOPY WITH HYDROTHERMAL ABLATION;  Surgeon: [name], MD;  Location: [facility];  Service: Gynecology;  Laterality: N/A;  HTA rep will be attend confirmed by office 12/12/15    NASAL SEPTOPLASTY W/ TURBINOPLASTY Bilateral 01/11/2021   Procedure: NASAL SEPTOPLASTY WITH TURBINATE REDUCTION;  Surgeon:  [name], MD;  Location: [facility] OR;  Service: ENT;  Laterality: Bilateral;   TONSILLECTOMY     Patient Active Problem List   Diagnosis Date Noted   Benign neoplasm of colon 09/01/2024   Menorrhagia 01/20/2023   Insulin  resistance 12/01/2022   Other fatigue 11/13/2022   SOBOE (shortness of breath on exertion) 11/13/2022   Other chronic pain 11/13/2022   Other hyperlipidemia 11/13/2022   Depression 11/13/2022   Screening for colon cancer 11/13/2022   Type 2 diabetes mellitus with other specified complication (HCC) 08/19/2022   Essential hypertension 08/19/2022   Tinnitus of both ears 12/05/2021   Deviated septum 01/11/2021   Nasal turbinate hypertrophy 04/08/2018   Obstructive sleep apnea treated with continuous positive airway pressure (CPAP) 12/29/2017   Onychomycosis 10/26/2014   Gastroesophageal reflux disease without esophagitis 09/28/2014   morbid obesity with starting BMI 55 08/25/2014   Chronic rhinitis 06/30/2014   Pancreatitis, acute 07/02/2013   Status post laparoscopic cholecystectomy 06/24/2013   Gallstones 04/13/2013   Low back pain 08/09/2012   Vitamin D  deficiency 08/09/2012    PCP: [name], [name], MD   REFERRING PROVIDER: Emiliano Leonce CROME, PA*   REFERRING DIAG: [phone] (ICD-10-CM) - Chronic bilateral low back pain with left-sided sciatica  THERAPY DIAG:  Other low back pain  Sciatica, left side  Abnormal posture  Difficulty in walking, not elsewhere classified  RATIONALE FOR EVALUATION AND TREATMENT: Rehabilitation  ONSET DATE:   NEXT MD VISIT:    SUBJECTIVE:                                                                                                                                                                                                         SUBJECTIVE STATEMENT: Pt reports something is happening that does not usually happen - she is having brief shock-like feelings in her upper buttocks lasting 10 sec or less  then resolving on their own.  She attributes this to wearing heels to church yesterday.  43 y/o patient referred to PT from orthopedics for acute on chronic LBP w/ L sciatica.   She reports that she has a h/o L lower back pain with radiation into the L hip and thigh.  This started 18-20 yrs ago when she fell on her back in the bathtub.   However, 2-3 weeks ago she started having sharp sudden intense B back pain/spasm that felt like an electric jolt that would hit her for 5-10 seconds many times daily and then be gone.   She denies any trauma such as falling or any changes in activity level with lifting/travel/etc.   The common factor sees to be the pain occurs when she is in a standing position.   The pain doesn't occur when she is sitting or lying down.   Sleep is not disrupted.  Going up steps increases her general sore back pain.  She has to lead with the RLE going up steps due to LLE weaknes.    Sit to stand transfers can increase the pain.     PAIN: Are you having pain? Yes: NPRS scale: 0/10 now;  8.5/10 sudden/quick pain Pain location: low back Pain description: shock-like feeling for 10-20 sec  Aggravating factors: standing with back extension seems to precipitate Relieving factors: sitting/lying  PERTINENT HISTORY:  NIDDM 2, HTN, h/o pancreatitis, palpitations, B Achilles tendonitis  PRECAUTIONS: None  RED FLAGS: None  WEIGHT BEARING RESTRICTIONS: No  FALLS:  Has patient fallen in last 6 months? No  LIVING ENVIRONMENT: Lives with: lives alone Lives in: House/apartment Stairs: Yes: External: 3 steps; none Has following equipment at home: None  OCCUPATION: administrative desk work 7 hrs daily  PLOF: Independent  PATIENT GOALS: not hurt in the back   OBJECTIVE: (objective measures completed at initial evaluation unless otherwise dated)  DIAGNOSTIC FINDINGS:  EXAM: LUMBAR SPINE - COMPLETE 4+ VIEW   COMPARISON:  07/29/2021   FINDINGS: Five non-rib-bearing lumbar  vertebra. Slight dextroscoliotic curvature at L4-L5. 8 mm grade 1 anterolisthesis of L4 on L5. No change in alignment on flexion or extension. Mild L4-L5 disc space narrowing and spurring. Mild multilevel facet hypertrophy. No evidence of pars defects or focal bone abnormalities right upper quadrant surgical clips again seen.   IMPRESSION: 1. Grade 1 anterolisthesis of L4 on L5. No change in alignment on flexion or extension. 2. Mild L4-L5 degenerative disc disease. Mild multilevel facet hypertrophy.     Electronically Signed   By: Andrea Gasman M.D.   On: 07/25/2024 16:57EXAM: PELVIS - 1-2 VIEW   COMPARISON:  None Available.   FINDINGS: The hip joint spaces are preserved. Both femoral heads are well seated in the respective acetabula. No fracture. No evidence of erosion or avascular necrosis. The pubic symphysis and sacroiliac joints are congruent. Left pelvic phleboliths.   IMPRESSION: Negative radiograph of the pelvis.     Electronically Signed   By: Andrea Gasman M.D.   On: 07/25/2024 16:58  PATIENT SURVEYS:  Modified Oswestry:  MODIFIED OSWESTRY DISABILITY SCALE    08/15/24 09/12/24  Pain intensity 4 =  Pain medication provides me with little relief from pain. 2  2. Personal care (washing, dressing, etc.) 2 =  It is painful to take care of myself, and I am slow and careful. 0  3. Lifting 1 = I can lift heavy weights, but it causes increased pain. 1  4. Walking 2 =  Pain prevents me from walking more than  mile. 1  5. Sitting 1 =  I can only sit in my favorite chair as long as I like. 1  6. Standing 3 =  Pain prevents me from standing more than 1/2 hour. 2  7. Sleeping 2 =  Even when I take pain medication, I sleep less than 6 hours 1  8. Social Life 3 =  Pain prevents me from going out very often. 0  9. Traveling 3 = My pain restricts my travel over 1 hour 1  10. Employment/ Homemaking 1 = My normal homemaking/job activities increase my pain, but I can still  perform all that is required of me 1  Total 22/50 10/50   Interpretation of scores: Score Category Description  0-20% Minimal Disability The patient can cope with most living activities. Usually no treatment is indicated apart from advice on lifting, sitting and exercise  21-40% Moderate Disability The patient experiences more pain and difficulty with sitting, lifting and standing. Travel and social life are more difficult and they may be disabled from work. Personal care, sexual activity and sleeping are not grossly affected, and the patient can usually be managed by conservative means  41-60% Severe Disability Pain remains the main problem in this group, but activities of daily living are affected. These patients require a detailed investigation  61-80% Crippled Back pain impinges on all aspects of the patient's life. Positive intervention is required  81-100% Bed-bound These patients are either bed-bound or exaggerating their symptoms  Bluford FORBES Zoe DELENA Karon DELENA, et al. Surgery versus conservative management of stable thoracolumbar fracture: the PRESTO feasibility RCT. [place] ([place]): [employer]; 2021 Nov. [facility] Technology Assessment, No. 25.62.) Appendix 3, Oswestry Disability Index category descriptors. Available from: Findjewelers.cz  Minimally Clinically Important Difference (MCID) = 12.8%  SCREENING FOR RED FLAGS: Bowel or bladder incontinence: No Spinal tumors: No Cauda equina syndrome: No Compression fracture: No Abdominal aneurysm: No  COGNITION:  Overall cognitive status: Within functional limits for tasks assessed  SENSATION: WFL  POSTURE:  increased lumbar lordosis  LLE is longer in supine at the malleolar level  PALPATION: TTP over B lumbar paraspinals and L SI joint;   P/A lumbar vertebral glides are hypermobile feeling  LUMBAR ROM:   Active  Eval 09/16/24  Flexion Just below knees; no pain ~2 inch from floor   Extension 100% ROM;  end range reproduces her sharp/brief intense pain full  Right lateral flexion Below knee full  Left lateral flexion Below knee full  Right rotation 100% full  Left rotation 100% full  (Blank rows = not tested)  MUSCLE LENGTH: Hamstrings: Right SLR = 85 deg; Left SLR = 90+ deg Thomas test: Right  deg; Left  deg Hamstrings: not tight ITB: not tight Piriformis: moderately tight L and less so R Hip flexors: not tight Quads: not tight Heelcord: NT  LOWER EXTREMITY ROM:     Active  Right eval Left eval  Hip flexion    Hip extension    Hip abduction    Hip adduction    Hip internal rotation    Hip external rotation    Knee flexion    Knee extension    Ankle dorsiflexion    Ankle plantarflexion    Ankle inversion    Ankle eversion    (Blank rows = not tested)  LOWER EXTREMITY MMT:    MMT R 09/19/24 L 09/19/24  Hip flexion 4+ 4+  Hip extension 4 4+  Hip abduction 4- 4  Hip adduction 4 4  Hip internal rotation 5 4+  Hip external rotation 4 4  Knee flexion 5 5  Knee extension 5 5  Ankle dorsiflexion 5 5  Ankle plantarflexion 3 3  Ankle inversion    Ankle eversion     (Blank rows = not tested)  LUMBAR SPECIAL TESTS:  Straight leg raise test: Negative, Slump test: Negative, SI Compression/distraction test: Negative, FABER test: Positive, and Gaenslen's test: Positive on the LLE Long sit test is long to short on the RLE indicating an anteriorly rotated inominate on the RLE vs. Posterior on LLE  FUNCTIONAL TESTS:  TBD  GAIT: Distance walked: into clinic Assistive device utilized: None Level of assistance: Complete Independence Gait pattern: supinates on the R, pronates on the LLE;  sway backed posture Comments:    TODAY'S TREATMENT:    09/19/24  THERAPEUTIC EXERCISE: To improve strength and endurance.    NuStep L5 x 7'  THERAPEUTIC ACTIVITIES: To improve functional performance.  Demonstration, verbal and tactile cues throughout for  technique.  LE MMT  NEUROMUSCULAR RE-EDUCATION: To improve coordination, kinesthesia, posture, and proprioception. Seated TrA + GTB B scap retraction + shoulder rows x 10 Seated TrA + GTB B scap retraction + shoulder extension x 10 Standing PPT + GTB B scap retraction + shoulder rows x 10 Standing PPT + GTB B scap retraction + shoulder extension x 10 Standing double GTB pallof press 2 x 10 bil Alt bird dog standing leaning on edge of elevated mat table x 10   09/16/24 THERAPEUTIC EXERCISE: To improve strength and endurance.  Demonstration, verbal and tactile cues throughout for technique. NuStep L5 x 7' Rechecked ROM  NEUROMUSCULAR RE-EDUCATION: To improve balance, coordination, kinesthesia, posture, and proprioception. Standing hip abduction GTB at ankles 2 x 10 B Standing hip EXT GTB at ankles 2 x 10 B Sidestepping GTB At ankles 4x15 ft Standing cable rows 35lb 2x10  Multifidus walkout 15lb 2x5 leading with L; 10lb leading with R side  09/12/24 THERAPEUTIC EXERCISE: To improve strength and endurance.  Demonstration, verbal and tactile cues throughout for technique. NuStep L5 x 7'  THERAPEUTIC ACTIVITIES: To improve functional performance.  Demonstration, verbal and tactile cues throughout for technique. MFI walkouts blue TB doubled x 6 laps each direction R lateral sideglides x 10 w/ 3 sec holds Standing hip flexor stretch x 1' x 2 LLE Supine hip flexor stretch x 1' x 2 LLE  NEUROMUSCULAR RE-EDUCATION: To improve balance, coordination, kinesthesia, posture, and proprioception. Standing shoulder ext w/ PPT blue TB x 2/10 BUE Lateral UE slides blue TB doubled with PPT x 10 each direction  MANUAL THERAPY: To promote normalized muscle tension, improved flexibility, and reduced pain utilizing MET and percussion massage with massage gun. MET for L posterior inominate f/b pelvic shotgun x1 and Long leg traction pulls to LLE Theragun setting 2 to upper R hip, QL, paraspinals, and SI  areas  MODALITIES: IFC to L low back and L posterior hip  x 15' at 50-56 mA intensity   09/05/24 THERAPEUTIC EXERCISE: To improve strength and endurance.  Demonstration, verbal and tactile cues throughout for technique. NuStep L4 x 7'  THERAPEUTIC ACTIVITIES: To improve functional performance.  Demonstration, verbal and tactile cues throughout for technique. MFI walkouts blue TB doubled x 6 laps each direction Lateral UE slides blue TB doubled with straight trunk x 10 each direction R lateral sideglides x 10 w/ 3 sec holds Standing hip flexor stretch x 1' x 2 LLE Supine hip flexor stretch x 1' x 2 LLE  MANUAL THERAPY: To promote normalized muscle tension, improved flexibility, and reduced pain utilizing MET and percussion massage with massage gun. MET for L posterior inominate f/b pelvic shotgun x1 and Long leg traction pulls to LLE Theragun setting 2 to upper R hip, QL, paraspinals, and SI areas  MODALITIES: IFC to L low back and L posterior hip    08/29/24 THERAPEUTIC EXERCISE: To improve strength and endurance.  Demonstration, verbal and tactile cues throughout for technique. NuStep L4 x 7'  THERAPEUTIC ACTIVITIES: To improve functional performance.  Demonstration, verbal and tactile cues throughout for technique. Seated RDL w/ 15# from mat table x 20 Seated 65 cm swiss ball rollouts 11:00, 12:00, 1:00 x 20 each direction  NEUROMUSCULAR RE-EDUCATION: To improve balance, coordination, kinesthesia, posture, and proprioception. Seated blue TB shoulder ext w/ hip flex x 2/10 BUE/BLE Attempted blue TB PNF extension/chop but increased LBP Seated palloff press Blue TB doubled x 2/10 MFI walkouts blue TB doubled w/ PPT x 2/10 each direction  MODALITIES: IFC to L low back and L posterior hip      08/18/24 NEUROMUSCULAR RE-EDUCATION: To improve coordination, kinesthesia, posture, and proprioception.  Supine PPT 10x3' Bridges with PPT 2x10 Supine curl ups 2x10; diagonal curl ups  2x10 Seated jefferson curl blue wt ball 2 x 10 Standing jefferson curl blue wt ball x 10 Standing lat pull + march RTB x 10  THERAPEUTIC EXERCISE: To improve strength, endurance, ROM, and flexibility.  Supine piriformis stretch opp leg straight 2 x 1 min B Quadruped cat 10x3' Seated lumbar flex stretch green pball x83min   08/15/24 SELF CARE: Provided education on PT POC progression; initial HEP;  postural correction w/ PPT throughout the day;  avoiding lumbar extension   PATIENT EDUCATION:  Education details: HEP review and HEP update  Person educated: Patient Education method: Explanation, Verbal cues, Tactile cues, Handouts, and MedBridgeGO app access provided Education comprehension: verbalized understanding, verbal cues required, tactile cues required, and  needs further education  HOME EXERCISE PROGRAM: Access Code: [email or web address] URL: [email or web address] Date: 09/05/2024 Prepared by: Garnette Montclair  Exercises - Supine Posterior Pelvic Tilt  - 1 x daily - 7 x weekly - 3 sets - 10 reps - Quadruped Cat with Posterior Pelvic Tilt  - 1 x daily - 7 x weekly - 3 sets - 10 reps - Curl Up with Reach  - 1 x daily - 7 x weekly - 3 sets - 10 reps - Diagonal Curl Up with Reach  - 1 x daily - 7 x weekly - 3 sets - 10 reps - Supine Piriformis Stretch with Leg Straight  - 1 x daily - 7 x weekly - 1 sets - 2 reps - 1 min hold - Seated Flexion Stretch with Swiss Ball  - 1 x daily - 7 x weekly - 1 sets - 3 reps - 1 min hold - Seated Anti-Rotation Press With Anchored Resistance  - 1 x daily - 7 x weekly - 3 sets - 10 reps - Anti-Rotation Sidestepping with Resistance  - 1 x daily - 7 x weekly - 3 sets - 10 reps - Right Standing Lateral Shift Correction at Wall - Repetitions  - 1 x daily - 7 x weekly - 1 sets - 10 reps - 2 -3 sec hold - Hip Flexor Stretch at Edge of Bed  - 1 x daily - 7 x weekly - 1 sets - 2 reps - 1 min hold - Standing Hip Flexor Stretch  - 1 x daily - 7 x weekly -  1 sets - 2 reps - min hold  ASSESSMENT:  CLINICAL IMPRESSION: Emmalie reports she has been experiencing a return of the brief shock-like pain in her B low back and upper buttocks today after wearing high heels to church yesterday, but no resting pain.  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  Advanced core stabilization exercises focusing on awareness of abdominal muscle activation and neutral spine/PPT.  Daleen will benefit from continued skilled PT to address ongoing pain, strength, stabilization deficits in the lumbar spine to improve mobility and activity tolerance with decreased pain interference.    EVAL: KENNESHIA REHM is a 43 y.o. female who was referred to physical therapy for evaluation and treatment for acute on chronic LBP w/ sciatica.   Patient reports onset of LBP pain beginning 2-3 weeks ago for no apparent reason. She has some chronic L low back pain with sciatica into the LLE, but reports this is normally just soreness/achiness.    The pain she is getting now is sudden, sharp, severe and only last 5-10 seconds, but it nearly brings her to her knees.    Pain is worse with with standing activities.   She does not normally get the pain with sitting or lying down.  Her posture is significantly accentuated lumbar lordosis and she reproduces the sharp pain with end range lumbar extension.   She also has piriformis tightness, hip weakness, and tenderness to palpate. Her LLE is a little longer than the RLE.   She is generally hyperflexible and hypermobile.   MRI shows some spondylolisthesis of L4 on L5 with DDD changes.    Patient has deficits in  hip flexibility, BLE strength, abnormal posture, and TTP with pain in the low back and hips which are interfering with ADLs and are impacting quality of life.  On Modified Oswestry patient scored 22/50  demonstrating 44% or moderate disability.  Danaisha will benefit from  skilled PT to address above deficits to improve mobility and activity tolerance with decreased pain interference.     OBJECTIVE IMPAIRMENTS: difficulty walking, decreased strength, postural dysfunction, and pain.   ACTIVITY LIMITATIONS: carrying, standing, stairs, and locomotion level  PARTICIPATION LIMITATIONS: meal prep, cleaning, laundry, shopping, and community activity  PERSONAL FACTORS: Fitness, Time since onset of injury/illness/exacerbation, and 1-2 comorbidities: NIDDM 2, HTN, h/o pancreatitis, palpitations are also affecting patient's functional outcome.   REHAB POTENTIAL: Good  CLINICAL DECISION MAKING: Evolving/moderate complexity  EVALUATION COMPLEXITY: Moderate   GOALS: Goals reviewed with patient? Yes  SHORT TERM GOALS: Target date: 09/12/2024    Patient will be independent with initial HEP to improve outcomes and carryover.  Baseline: 100% PT assist required for correct completion Goal status: MET- 08/29/24  2.  Patient will report 25% improvement in low back pain to improve QOL. Baseline: 10/10 worst 08/29/24:  6.5/10 Goal status: MET   LONG TERM GOALS: Target date: 10/11/2024   Patient will be independent with ongoing/advanced HEP for self-management at home.  Baseline: no advanced HEP yet 08/29/24:  medbridge updated Goal status: IN PROGRESS- met for current 09/16/24  2.  Patient will report 50-75% improvement in low back pain to improve QOL.  Baseline: 10/10 worst 09/05/24:  4/10 today 09/12/24:  2/10 Goal status: IN PROGRESS  3.  Patient to demonstrate ability to achieve and maintain good spinal alignment/posturing and body mechanics needed for daily activities. Baseline: significant lumbar lordosis 09/12/24:  progressing with posterior pelvic tilt in standing  Goal status: IN PROGRESS  4.  Patient will demonstrate full pain free lumbar ROM to perform ADLs.   Baseline: Refer to above lumbar ROM table Goal status: IN PROGRESS - 09/16/24 see  table  5.  Patient will demonstrate improved BLE strength to >/= 5/5 for improved stability and ease of mobility. Baseline: Refer to above LE MMT table Goal status: IN PROGRESS - 09/19/24 - met for B knees, B DF, R hip IR  6. Patient will report </= 30% on Modified Oswestry (MCID = 12%) to demonstrate improved functional ability with decreased pain interference. Baseline: 44% 09/12/24:  20% Goal status: MET   PLAN:  PT FREQUENCY: 1-2x/week  PT DURATION: 8 weeks  PLANNED INTERVENTIONS: 97164- PT Re-evaluation, 97110-Therapeutic exercises, 97530- Therapeutic activity, 97112- Neuromuscular re-education, 97535- Self Care, 02859- Manual therapy, [id number]- Electrical stimulation (unattended), 97016- Vasopneumatic device, [id number]- Ultrasound, [id number]- Traction (mechanical), [id number]- Ionotophoresis 4mg /ml Dexamethasone , 79439 (1-2 muscles), 20561 (3+ muscles)- Dry Needling, Patient/Family education, Taping, Joint mobilization, Spinal mobilization, Cryotherapy, and Moist heat  PLAN FOR NEXT SESSION: Continue with lumbar stabilization with posterior pelvic tilt, quadruped activities (limited tolerance due to pressure on knees), continue with MFR and estim since these are providing good relief for her pain.  Elijah CHRISTELLA Hidden, PT 09/19/2024, 8:46 AM

## 2024-09-21 ENCOUNTER — Ambulatory Visit

## 2024-09-24 LAB — GENECONNECT MOLECULAR SCREEN

## 2024-09-27 ENCOUNTER — Encounter: Payer: Self-pay | Admitting: Family Medicine

## 2024-10-03 ENCOUNTER — Ambulatory Visit (HOSPITAL_COMMUNITY)
Admission: RE | Admit: 2024-10-03 | Discharge: 2024-10-03 | Disposition: A | Discharge status: Home / Self Care | Source: Ambulatory Visit | Attending: Family Medicine | Admitting: Family Medicine

## 2024-10-03 DIAGNOSIS — H93A1 Pulsatile tinnitus, right ear: Secondary | ICD-10-CM | POA: Insufficient documentation

## 2024-10-03 DIAGNOSIS — H93A9 Pulsatile tinnitus, unspecified ear: Secondary | ICD-10-CM | POA: Diagnosis not present

## 2024-10-04 ENCOUNTER — Ambulatory Visit

## 2024-10-04 DIAGNOSIS — R262 Difficulty in walking, not elsewhere classified: Secondary | ICD-10-CM | POA: Diagnosis not present

## 2024-10-04 DIAGNOSIS — R293 Abnormal posture: Secondary | ICD-10-CM | POA: Diagnosis not present

## 2024-10-04 DIAGNOSIS — M5459 Other low back pain: Secondary | ICD-10-CM | POA: Diagnosis not present

## 2024-10-04 DIAGNOSIS — M5432 Sciatica, left side: Secondary | ICD-10-CM

## 2024-10-04 NOTE — Therapy (Signed)
 " OUTPATIENT PHYSICAL THERAPY THORACOLUMBAR TREATMENT   Patient Name: MALAYASIA MIRKIN MRN: [id number] DOB:05/04/1981, 43 y.o., female Today's Date: 10/04/2024  END OF SESSION:  PT End of Session - 10/04/24 1147     Visit Number 8    Date for Recertification  10/10/24    Authorization Type BCBS    PT Start Time 1104    PT Stop Time 1145    PT Time Calculation (min) 41 min    Activity Tolerance Patient tolerated treatment well;No increased pain    Behavior During Therapy [facility] for tasks assessed/performed              Past Medical History:  Diagnosis Date   Abdominal pain    related to gallstones   Achilles tendonitis, bilateral    Back pain    Bronchitis    02/17   Chest pain    Constipation    Diabetes (HCC)    Type II   DKA (diabetic ketoacidoses) 08/25/2014   Gallstones    GERD (gastroesophageal reflux disease)    Headache    migraines   Hypertension    Joint pain    Lattice degeneration, both eyes    Palpitations    Pancreatitis    PCOS (polycystic ovarian syndrome)    Sinus congestion    occ. uses OTC meds for this   Sleep apnea    USES CPAP   Vitamin D  deficiency    Past Surgical History:  Procedure Laterality Date   CHOLECYSTECTOMY N/A 06/22/2013   Procedure: LAPAROSCOPIC CHOLECYSTECTOMY ;  Surgeon: Elon CHRISTELLA Pacini, MD;  Location: [facility];  Service: General;  Laterality: N/A;   COLONOSCOPY N/A 09/01/2024   Procedure: COLONOSCOPY;  Surgeon: Leigh Elspeth SQUIBB, MD;  Location: [facility];  Service: Gastroenterology;  Laterality: N/A;   DILITATION & CURRETTAGE/HYSTROSCOPY WITH HYDROTHERMAL ABLATION N/A 01/03/2016   Procedure: DILATATION & CURETTAGE/HYSTEROSCOPY WITH HYDROTHERMAL ABLATION;  Surgeon: [name], MD;  Location: [facility];  Service: Gynecology;  Laterality: N/A;  HTA rep will be attend confirmed by office 12/12/15    NASAL SEPTOPLASTY W/ TURBINOPLASTY Bilateral 01/11/2021   Procedure: NASAL SEPTOPLASTY WITH TURBINATE REDUCTION;  Surgeon:  [name], MD;  Location: [facility];  Service: ENT;  Laterality: Bilateral;   TONSILLECTOMY     Patient Active Problem List   Diagnosis Date Noted   Benign neoplasm of colon 09/01/2024   Menorrhagia 01/20/2023   Insulin  resistance 12/01/2022   Other fatigue 11/13/2022   SOBOE (shortness of breath on exertion) 11/13/2022   Other chronic pain 11/13/2022   Other hyperlipidemia 11/13/2022   Depression 11/13/2022   Screening for colon cancer 11/13/2022   Type 2 diabetes mellitus with other specified complication (HCC) 08/19/2022   Essential hypertension 08/19/2022   Tinnitus of both ears 12/05/2021   Deviated septum 01/11/2021   Nasal turbinate hypertrophy 04/08/2018   Obstructive sleep apnea treated with continuous positive airway pressure (CPAP) 12/29/2017   Onychomycosis 10/26/2014   Gastroesophageal reflux disease without esophagitis 09/28/2014   morbid obesity with starting BMI 55 08/25/2014   Chronic rhinitis 06/30/2014   Pancreatitis, acute 07/02/2013   Status post laparoscopic cholecystectomy 06/24/2013   Gallstones 04/13/2013   Low back pain 08/09/2012   Vitamin D  deficiency 08/09/2012    PCP: [name], [name], MD   REFERRING PROVIDER: Watt [name], MD   REFERRING DIAG: G89.29,M54.42 (ICD-10-CM) - Chronic bilateral low back pain with left-sided sciatica  THERAPY DIAG:  Other low back pain  Sciatica, left side  Abnormal  posture  Difficulty in walking, not elsewhere classified  RATIONALE FOR EVALUATION AND TREATMENT: Rehabilitation  ONSET DATE:   NEXT MD VISIT:    SUBJECTIVE:                                                                                                                                                                                                         SUBJECTIVE STATEMENT: Pt reports no issues during vacation  43 y/o patient referred to PT from orthopedics for acute on chronic LBP w/ L sciatica.   She reports that she has a  h/o L lower back pain with radiation into the L hip and thigh.  This started 18-20 yrs ago when she fell on her back in the bathtub.   However, 2-3 weeks ago she started having sharp sudden intense B back pain/spasm that felt like an electric jolt that would hit her for 5-10 seconds many times daily and then be gone.   She denies any trauma such as falling or any changes in activity level with lifting/travel/etc.   The common factor sees to be the pain occurs when she is in a standing position.   The pain doesn't occur when she is sitting or lying down.   Sleep is not disrupted.  Going up steps increases her general sore back pain.  She has to lead with the RLE going up steps due to LLE weaknes.    Sit to stand transfers can increase the pain.     PAIN: Are you having pain? Yes: NPRS scale: 0/10 now;  8.5/10 sudden/quick pain Pain location: low back Pain description: shock-like feeling for 10-20 sec  Aggravating factors: standing with back extension seems to precipitate Relieving factors: sitting/lying  PERTINENT HISTORY:  NIDDM 2, HTN, h/o pancreatitis, palpitations, B Achilles tendonitis  PRECAUTIONS: None  RED FLAGS: None  WEIGHT BEARING RESTRICTIONS: No  FALLS:  Has patient fallen in last 6 months? No  LIVING ENVIRONMENT: Lives with: lives alone Lives in: House/apartment Stairs: Yes: External: 3 steps; none Has following equipment at home: None  OCCUPATION: administrative desk work 7 hrs daily  PLOF: Independent  PATIENT GOALS: not hurt in the back   OBJECTIVE: (objective measures completed at initial evaluation unless otherwise dated)  DIAGNOSTIC FINDINGS:  EXAM: LUMBAR SPINE - COMPLETE 4+ VIEW   COMPARISON:  07/29/2021   FINDINGS: Five non-rib-bearing lumbar vertebra. Slight dextroscoliotic curvature at L4-L5. 8 mm grade 1 anterolisthesis of L4 on L5. No change in alignment on flexion or extension. Mild L4-L5 disc space narrowing and spurring. Mild multilevel  facet hypertrophy. No evidence of pars defects or focal bone abnormalities right upper quadrant surgical clips again seen.   IMPRESSION: 1. Grade 1 anterolisthesis of L4 on L5. No change in alignment on flexion or extension. 2. Mild L4-L5 degenerative disc disease. Mild multilevel facet hypertrophy.     Electronically Signed   By: Andrea Gasman M.D.   On: 07/25/2024 16:57EXAM: PELVIS - 1-2 VIEW   COMPARISON:  None Available.   FINDINGS: The hip joint spaces are preserved. Both femoral heads are well seated in the respective acetabula. No fracture. No evidence of erosion or avascular necrosis. The pubic symphysis and sacroiliac joints are congruent. Left pelvic phleboliths.   IMPRESSION: Negative radiograph of the pelvis.     Electronically Signed   By: Andrea Gasman M.D.   On: 07/25/2024 16:58  PATIENT SURVEYS:  Modified Oswestry:  MODIFIED OSWESTRY DISABILITY SCALE    08/15/24 09/12/24  Pain intensity 4 =  Pain medication provides me with little relief from pain. 2  2. Personal care (washing, dressing, etc.) 2 =  It is painful to take care of myself, and I am slow and careful. 0  3. Lifting 1 = I can lift heavy weights, but it causes increased pain. 1  4. Walking 2 =  Pain prevents me from walking more than  mile. 1  5. Sitting 1 =  I can only sit in my favorite chair as long as I like. 1  6. Standing 3 =  Pain prevents me from standing more than 1/2 hour. 2  7. Sleeping 2 =  Even when I take pain medication, I sleep less than 6 hours 1  8. Social Life 3 =  Pain prevents me from going out very often. 0  9. Traveling 3 = My pain restricts my travel over 1 hour 1  10. Employment/ Homemaking 1 = My normal homemaking/job activities increase my pain, but I can still perform all that is required of me 1  Total 22/50 10/50   Interpretation of scores: Score Category Description  0-20% Minimal Disability The patient can cope with most living activities. Usually no  treatment is indicated apart from advice on lifting, sitting and exercise  21-40% Moderate Disability The patient experiences more pain and difficulty with sitting, lifting and standing. Travel and social life are more difficult and they may be disabled from work. Personal care, sexual activity and sleeping are not grossly affected, and the patient can usually be managed by conservative means  41-60% Severe Disability Pain remains the main problem in this group, but activities of daily living are affected. These patients require a detailed investigation  61-80% Crippled Back pain impinges on all aspects of the patients life. Positive intervention is required  81-100% Bed-bound These patients are either bed-bound or exaggerating their symptoms  Bluford FORBES Zoe DELENA Karon DELENA, et al. Surgery versus conservative management of stable thoracolumbar fracture: the PRESTO feasibility RCT. [place] ([place]): [employer]; 2021 Nov. [facility] Technology Assessment, No. 25.62.) Appendix 3, Oswestry Disability Index category descriptors. Available from: Findjewelers.cz  Minimally Clinically Important Difference (MCID) = 12.8%  SCREENING FOR RED FLAGS: Bowel or bladder incontinence: No Spinal tumors: No Cauda equina syndrome: No Compression fracture: No Abdominal aneurysm: No  COGNITION:  Overall cognitive status: Within functional limits for tasks assessed    SENSATION: WFL  POSTURE:  increased lumbar lordosis  LLE is longer in supine at the malleolar level  PALPATION: TTP over B lumbar paraspinals and L SI joint;  P/A lumbar vertebral glides are hypermobile feeling  LUMBAR ROM:   Active  Eval 09/16/24  Flexion Just below knees; no pain ~2 inch from floor  Extension 100% ROM;  end range reproduces her sharp/brief intense pain full  Right lateral flexion Below knee full  Left lateral flexion Below knee full  Right rotation 100% full  Left rotation 100%  full  (Blank rows = not tested)  MUSCLE LENGTH: Hamstrings: Right SLR = 85 deg; Left SLR = 90+ deg Thomas test: Right  deg; Left  deg Hamstrings: not tight ITB: not tight Piriformis: moderately tight L and less so R Hip flexors: not tight Quads: not tight Heelcord: NT  LOWER EXTREMITY ROM:     Active  Right eval Left eval  Hip flexion    Hip extension    Hip abduction    Hip adduction    Hip internal rotation    Hip external rotation    Knee flexion    Knee extension    Ankle dorsiflexion    Ankle plantarflexion    Ankle inversion    Ankle eversion    (Blank rows = not tested)  LOWER EXTREMITY MMT:    MMT R 09/19/24 L 09/19/24  Hip flexion 4+ 4+  Hip extension 4 4+  Hip abduction 4- 4  Hip adduction 4 4  Hip internal rotation 5 4+  Hip external rotation 4 4  Knee flexion 5 5  Knee extension 5 5  Ankle dorsiflexion 5 5  Ankle plantarflexion 3 3  Ankle inversion    Ankle eversion     (Blank rows = not tested)  LUMBAR SPECIAL TESTS:  Straight leg raise test: Negative, Slump test: Negative, SI Compression/distraction test: Negative, FABER test: Positive, and Gaenslen's test: Positive on the LLE Long sit test is long to short on the RLE indicating an anteriorly rotated inominate on the RLE vs. Posterior on LLE  FUNCTIONAL TESTS:  TBD  GAIT: Distance walked: into clinic Assistive device utilized: None Level of assistance: Complete Independence Gait pattern: supinates on the R, pronates on the LLE;  sway backed posture Comments:    TODAY'S TREATMENT:  10/04/24 Nustep L5x49min UE/LE  Standing PPT + GTB B scap retraction + shoulder rows 2 x 10 Standing PPT + GTB B scap retraction + shoulder extension 2 x 10 Standing pallof press GTB 2x10 B Deadlift 10lb 2x10 Kettle bell swing 15lb 2x10  09/19/24  THERAPEUTIC EXERCISE: To improve strength and endurance.    NuStep L5 x 7'  THERAPEUTIC ACTIVITIES: To improve functional performance.  Demonstration, verbal  and tactile cues throughout for technique.  LE MMT  NEUROMUSCULAR RE-EDUCATION: To improve coordination, kinesthesia, posture, and proprioception. Seated TrA + GTB B scap retraction + shoulder rows x 10 Seated TrA + GTB B scap retraction + shoulder extension x 10 Standing PPT + GTB B scap retraction + shoulder rows x 10 Standing PPT + GTB B scap retraction + shoulder extension x 10 Standing double GTB pallof press 2 x 10 bil Alt bird dog standing leaning on edge of elevated mat table x 10   09/16/24 THERAPEUTIC EXERCISE: To improve strength and endurance.  Demonstration, verbal and tactile cues throughout for technique. NuStep L5 x 7' Rechecked ROM  NEUROMUSCULAR RE-EDUCATION: To improve balance, coordination, kinesthesia, posture, and proprioception. Standing hip abduction GTB at ankles 2 x 10 B Standing hip EXT GTB at ankles 2 x 10 B Sidestepping GTB At ankles 4x15 ft Standing cable rows 35lb 2x10  Multifidus  walkout 15lb 2x5 leading with L; 10lb leading with R side   09/12/24 THERAPEUTIC EXERCISE: To improve strength and endurance.  Demonstration, verbal and tactile cues throughout for technique. NuStep L5 x 7'  THERAPEUTIC ACTIVITIES: To improve functional performance.  Demonstration, verbal and tactile cues throughout for technique. MFI walkouts blue TB doubled x 6 laps each direction R lateral sideglides x 10 w/ 3 sec holds Standing hip flexor stretch x 1' x 2 LLE Supine hip flexor stretch x 1' x 2 LLE  NEUROMUSCULAR RE-EDUCATION: To improve balance, coordination, kinesthesia, posture, and proprioception. Standing shoulder ext w/ PPT blue TB x 2/10 BUE Lateral UE slides blue TB doubled with PPT x 10 each direction  MANUAL THERAPY: To promote normalized muscle tension, improved flexibility, and reduced pain utilizing MET and percussion massage with massage gun. MET for L posterior inominate f/b pelvic shotgun x1 and Long leg traction pulls to LLE Theragun setting 2 to  upper R hip, QL, paraspinals, and SI areas  MODALITIES: IFC to L low back and L posterior hip  x 15' at 50-56 mA intensity   09/05/24 THERAPEUTIC EXERCISE: To improve strength and endurance.  Demonstration, verbal and tactile cues throughout for technique. NuStep L4 x 7'  THERAPEUTIC ACTIVITIES: To improve functional performance.  Demonstration, verbal and tactile cues throughout for technique. MFI walkouts blue TB doubled x 6 laps each direction Lateral UE slides blue TB doubled with straight trunk x 10 each direction R lateral sideglides x 10 w/ 3 sec holds Standing hip flexor stretch x 1' x 2 LLE Supine hip flexor stretch x 1' x 2 LLE  MANUAL THERAPY: To promote normalized muscle tension, improved flexibility, and reduced pain utilizing MET and percussion massage with massage gun. MET for L posterior inominate f/b pelvic shotgun x1 and Long leg traction pulls to LLE Theragun setting 2 to upper R hip, QL, paraspinals, and SI areas  MODALITIES: IFC to L low back and L posterior hip    08/29/24 THERAPEUTIC EXERCISE: To improve strength and endurance.  Demonstration, verbal and tactile cues throughout for technique. NuStep L4 x 7'  THERAPEUTIC ACTIVITIES: To improve functional performance.  Demonstration, verbal and tactile cues throughout for technique. Seated RDL w/ 15# from mat table x 20 Seated 65 cm swiss ball rollouts 11:00, 12:00, 1:00 x 20 each direction  NEUROMUSCULAR RE-EDUCATION: To improve balance, coordination, kinesthesia, posture, and proprioception. Seated blue TB shoulder ext w/ hip flex x 2/10 BUE/BLE Attempted blue TB PNF extension/chop but increased LBP Seated palloff press Blue TB doubled x 2/10 MFI walkouts blue TB doubled w/ PPT x 2/10 each direction  MODALITIES: IFC to L low back and L posterior hip      08/18/24 NEUROMUSCULAR RE-EDUCATION: To improve coordination, kinesthesia, posture, and proprioception.  Supine PPT 10x3' Bridges with PPT  2x10 Supine curl ups 2x10; diagonal curl ups 2x10 Seated jefferson curl blue wt ball 2 x 10 Standing jefferson curl blue wt ball x 10 Standing lat pull + march RTB x 10  THERAPEUTIC EXERCISE: To improve strength, endurance, ROM, and flexibility.  Supine piriformis stretch opp leg straight 2 x 1 min B Quadruped cat 10x3' Seated lumbar flex stretch green pball x9min   08/15/24 SELF CARE: Provided education on PT POC progression; initial HEP;  postural correction w/ PPT throughout the day;  avoiding lumbar extension   PATIENT EDUCATION:  Education details: HEP review and HEP update  Person educated: Patient Education method: Explanation, Verbal cues, Tactile cues, Handouts, and [email or web address] app  access provided Education comprehension: verbalized understanding, verbal cues required, tactile cues required, and needs further education  HOME EXERCISE PROGRAM: Access Code: [email or web address] URL: [email or web address] Date: 09/05/2024 Prepared by: Garnette Montclair  Exercises - Supine Posterior Pelvic Tilt  - 1 x daily - 7 x weekly - 3 sets - 10 reps - Quadruped Cat with Posterior Pelvic Tilt  - 1 x daily - 7 x weekly - 3 sets - 10 reps - Curl Up with Reach  - 1 x daily - 7 x weekly - 3 sets - 10 reps - Diagonal Curl Up with Reach  - 1 x daily - 7 x weekly - 3 sets - 10 reps - Supine Piriformis Stretch with Leg Straight  - 1 x daily - 7 x weekly - 1 sets - 2 reps - 1 min hold - Seated Flexion Stretch with Swiss Ball  - 1 x daily - 7 x weekly - 1 sets - 3 reps - 1 min hold - Seated Anti-Rotation Press With Anchored Resistance  - 1 x daily - 7 x weekly - 3 sets - 10 reps - Anti-Rotation Sidestepping with Resistance  - 1 x daily - 7 x weekly - 3 sets - 10 reps - Right Standing Lateral Shift Correction at Wall - Repetitions  - 1 x daily - 7 x weekly - 1 sets - 10 reps - 2 -3 sec hold - Hip Flexor Stretch at Edge of Bed  - 1 x daily - 7 x weekly - 1 sets - 2 reps - 1 min hold - Standing  Hip Flexor Stretch  - 1 x daily - 7 x weekly - 1 sets - 2 reps - min hold  ASSESSMENT:  CLINICAL IMPRESSION: Progressed patient through core stab exercises and hip strengthening exercises. No pain with interventions today. She notes 70% improvement overall with pain levels. Still focusing on PPT to correct excessive lordosis. Shavaughn will benefit from continued skilled PT to address ongoing pain, strength, stabilization deficits in the lumbar spine to improve mobility and activity tolerance with decreased pain interference.    EVAL: MIKEISHA LEMONDS is a 43 y.o. female who was referred to physical therapy for evaluation and treatment for acute on chronic LBP w/ sciatica.   Patient reports onset of LBP pain beginning 2-3 weeks ago for no apparent reason. She has some chronic L low back pain with sciatica into the LLE, but reports this is normally just soreness/achiness.    The pain she is getting now is sudden, sharp, severe and only last 5-10 seconds, but it nearly brings her to her knees.    Pain is worse with with standing activities.   She does not normally get the pain with sitting or lying down.  Her posture is significantly accentuated lumbar lordosis and she reproduces the sharp pain with end range lumbar extension.   She also has piriformis tightness, hip weakness, and tenderness to palpate. Her LLE is a little longer than the RLE.   She is generally hyperflexible and hypermobile.   MRI shows some spondylolisthesis of L4 on L5 with DDD changes.    Patient has deficits in  hip flexibility, BLE strength, abnormal posture, and TTP with pain in the low back and hips which are interfering with ADLs and are impacting quality of life.  On Modified Oswestry patient scored 22/50 demonstrating 44% or moderate disability.  Jazelyn will benefit from skilled PT to address above deficits to improve mobility and activity tolerance with decreased pain interference.  OBJECTIVE IMPAIRMENTS: difficulty walking,  decreased strength, postural dysfunction, and pain.   ACTIVITY LIMITATIONS: carrying, standing, stairs, and locomotion level  PARTICIPATION LIMITATIONS: meal prep, cleaning, laundry, shopping, and community activity  PERSONAL FACTORS: Fitness, Time since onset of injury/illness/exacerbation, and 1-2 comorbidities: NIDDM 2, HTN, h/o pancreatitis, palpitations are also affecting patient's functional outcome.   REHAB POTENTIAL: Good  CLINICAL DECISION MAKING: Evolving/moderate complexity  EVALUATION COMPLEXITY: Moderate   GOALS: Goals reviewed with patient? Yes  SHORT TERM GOALS: Target date: 09/12/2024    Patient will be independent with initial HEP to improve outcomes and carryover.  Baseline: 100% PT assist required for correct completion Goal status: MET- 08/29/24  2.  Patient will report 25% improvement in low back pain to improve QOL. Baseline: 10/10 worst 08/29/24:  6.5/10 Goal status: MET   LONG TERM GOALS: Target date: 10/11/2024   Patient will be independent with ongoing/advanced HEP for self-management at home.  Baseline: no advanced HEP yet 08/29/24:  medbridge updated Goal status: IN PROGRESS- met for current 09/16/24  2.  Patient will report 50-75% improvement in low back pain to improve QOL.  Baseline: 10/10 worst 09/05/24:  4/10 today 09/12/24:  2/10 Goal status: MET - 10/04/24 70% improvement  3.  Patient to demonstrate ability to achieve and maintain good spinal alignment/posturing and body mechanics needed for daily activities. Baseline: significant lumbar lordosis 09/12/24:  progressing with posterior pelvic tilt in standing  Goal status: IN PROGRESS  4.  Patient will demonstrate full pain free lumbar ROM to perform ADLs.   Baseline: Refer to above lumbar ROM table Goal status: IN PROGRESS - 09/16/24 see table  5.  Patient will demonstrate improved BLE strength to >/= 5/5 for improved stability and ease of mobility. Baseline: Refer to above LE MMT  table Goal status: IN PROGRESS - 09/19/24 - met for B knees, B DF, R hip IR  6. Patient will report </= 30% on Modified Oswestry (MCID = 12%) to demonstrate improved functional ability with decreased pain interference. Baseline: 44% 09/12/24:  20% Goal status: MET   PLAN:  PT FREQUENCY: 1-2x/week  PT DURATION: 8 weeks  PLANNED INTERVENTIONS: 97164- PT Re-evaluation, 97110-Therapeutic exercises, 97530- Therapeutic activity, 97112- Neuromuscular re-education, 97535- Self Care, [id number]- Manual therapy, [id number]- Electrical stimulation (unattended), 97016- Vasopneumatic device, [id number]- Ultrasound, [id number]- Traction (mechanical), [id number]- Ionotophoresis 4mg /ml Dexamethasone , 79439 (1-2 muscles), 20561 (3+ muscles)- Dry Needling, Patient/Family education, Taping, Joint mobilization, Spinal mobilization, Cryotherapy, and Moist heat  PLAN FOR NEXT SESSION: Continue with lumbar stabilization with posterior pelvic tilt, quadruped activities (limited tolerance due to pressure on knees), continue with MFR and estim since these are providing good relief for her pain.  Jerlyn Pain L Gregrey Bloyd, PTA 10/04/2024, 11:47 AM  "

## 2024-10-11 ENCOUNTER — Encounter: Payer: Self-pay | Admitting: Rehabilitation

## 2024-10-11 ENCOUNTER — Ambulatory Visit: Admitting: Rehabilitation

## 2024-10-11 DIAGNOSIS — M5432 Sciatica, left side: Secondary | ICD-10-CM | POA: Diagnosis not present

## 2024-10-11 DIAGNOSIS — M5459 Other low back pain: Secondary | ICD-10-CM | POA: Diagnosis not present

## 2024-10-11 DIAGNOSIS — R262 Difficulty in walking, not elsewhere classified: Secondary | ICD-10-CM

## 2024-10-11 DIAGNOSIS — R293 Abnormal posture: Secondary | ICD-10-CM

## 2024-10-11 DIAGNOSIS — F4320 Adjustment disorder, unspecified: Secondary | ICD-10-CM | POA: Diagnosis not present

## 2024-10-11 NOTE — Therapy (Signed)
 " OUTPATIENT PHYSICAL THERAPY THORACOLUMBAR TREATMENT /RECERTIFICATION   Patient Name: Angel Dawson MRN: [id number] DOB:07/18/1981, 43 y.o., female Today's Date: 10/11/2024  END OF SESSION:  PT End of Session - 10/11/24 1149     Visit Number 9    Date for Recertification  10/10/24    Authorization Type BCBS    PT Start Time 1148    PT Stop Time 1235    PT Time Calculation (min) 47 min    Activity Tolerance Patient tolerated treatment well;No increased pain    Behavior During Therapy [facility] for tasks assessed/performed              Past Medical History:  Diagnosis Date   Abdominal pain    related to gallstones   Achilles tendonitis, bilateral    Back pain    Bronchitis    02/17   Chest pain    Constipation    Diabetes (HCC)    Type II   DKA (diabetic ketoacidoses) 08/25/2014   Gallstones    GERD (gastroesophageal reflux disease)    Headache    migraines   Hypertension    Joint pain    Lattice degeneration, both eyes    Palpitations    Pancreatitis    PCOS (polycystic ovarian syndrome)    Sinus congestion    occ. uses OTC meds for this   Sleep apnea    USES CPAP   Vitamin D  deficiency    Past Surgical History:  Procedure Laterality Date   CHOLECYSTECTOMY N/A 06/22/2013   Procedure: LAPAROSCOPIC CHOLECYSTECTOMY ;  Surgeon: Elon CHRISTELLA Pacini, MD;  Location: [facility];  Service: General;  Laterality: N/A;   COLONOSCOPY N/A 09/01/2024   Procedure: COLONOSCOPY;  Surgeon: Leigh Elspeth SQUIBB, MD;  Location: [facility];  Service: Gastroenterology;  Laterality: N/A;   DILITATION & CURRETTAGE/HYSTROSCOPY WITH HYDROTHERMAL ABLATION N/A 01/03/2016   Procedure: DILATATION & CURETTAGE/HYSTEROSCOPY WITH HYDROTHERMAL ABLATION;  Surgeon: [name], MD;  Location: [facility];  Service: Gynecology;  Laterality: N/A;  HTA rep will be attend confirmed by office 12/12/15    NASAL SEPTOPLASTY W/ TURBINOPLASTY Bilateral 01/11/2021   Procedure: NASAL SEPTOPLASTY WITH TURBINATE  REDUCTION;  Surgeon: [name], MD;  Location: [facility];  Service: ENT;  Laterality: Bilateral;   TONSILLECTOMY     Patient Active Problem List   Diagnosis Date Noted   Benign neoplasm of colon 09/01/2024   Menorrhagia 01/20/2023   Insulin  resistance 12/01/2022   Other fatigue 11/13/2022   SOBOE (shortness of breath on exertion) 11/13/2022   Other chronic pain 11/13/2022   Other hyperlipidemia 11/13/2022   Depression 11/13/2022   Screening for colon cancer 11/13/2022   Type 2 diabetes mellitus with other specified complication (HCC) 08/19/2022   Essential hypertension 08/19/2022   Tinnitus of both ears 12/05/2021   Deviated septum 01/11/2021   Nasal turbinate hypertrophy 04/08/2018   Obstructive sleep apnea treated with continuous positive airway pressure (CPAP) 12/29/2017   Onychomycosis 10/26/2014   Gastroesophageal reflux disease without esophagitis 09/28/2014   morbid obesity with starting BMI 55 08/25/2014   Chronic rhinitis 06/30/2014   Pancreatitis, acute 07/02/2013   Status post laparoscopic cholecystectomy 06/24/2013   Gallstones 04/13/2013   Low back pain 08/09/2012   Vitamin D  deficiency 08/09/2012    PCP: [name], [name], MD   REFERRING PROVIDER: Watt [name], MD   REFERRING DIAG: G89.29,M54.42 (ICD-10-CM) - Chronic bilateral low back pain with left-sided sciatica  THERAPY DIAG:  Other low back pain  Sciatica, left side  Abnormal posture  Difficulty in walking, not elsewhere classified  RATIONALE FOR EVALUATION AND TREATMENT: Rehabilitation  ONSET DATE:   NEXT MD VISIT:    SUBJECTIVE:                                                                                                                                                                                                         SUBJECTIVE STATEMENT: Patient states she is really not having any pain in her back today.   She is pleased with progress.   Still experiencing weakness in her  LLE though per her report  43 y/o patient referred to PT from orthopedics for acute on chronic LBP w/ L sciatica.   She reports that she has a h/o L lower back pain with radiation into the L hip and thigh.  This started 18-20 yrs ago when she fell on her back in the bathtub.   However, 2-3 weeks ago she started having sharp sudden intense B back pain/spasm that felt like an electric jolt that would hit her for 5-10 seconds many times daily and then be gone.   She denies any trauma such as falling or any changes in activity level with lifting/travel/etc.   The common factor sees to be the pain occurs when she is in a standing position.   The pain doesn't occur when she is sitting or lying down.   Sleep is not disrupted.  Going up steps increases her general sore back pain.  She has to lead with the RLE going up steps due to LLE weaknes.    Sit to stand transfers can increase the pain.     PAIN: Are you having pain? Yes: NPRS scale: 0/10 now;  8.5/10 sudden/quick pain Pain location: low back Pain description: shock-like feeling for 10-20 sec  Aggravating factors: standing with back extension seems to precipitate Relieving factors: sitting/lying  PERTINENT HISTORY:  NIDDM 2, HTN, h/o pancreatitis, palpitations, B Achilles tendonitis  PRECAUTIONS: None  RED FLAGS: None  WEIGHT BEARING RESTRICTIONS: No  FALLS:  Has patient fallen in last 6 months? No  LIVING ENVIRONMENT: Lives with: lives alone Lives in: House/apartment Stairs: Yes: External: 3 steps; none Has following equipment at home: None  OCCUPATION: administrative desk work 7 hrs daily  PLOF: Independent  PATIENT GOALS: not hurt in the back   OBJECTIVE: (objective measures completed at initial evaluation unless otherwise dated)  DIAGNOSTIC FINDINGS:  EXAM: LUMBAR SPINE - COMPLETE 4+ VIEW   COMPARISON:  07/29/2021   FINDINGS: Five non-rib-bearing lumbar vertebra. Slight dextroscoliotic curvature at L4-L5.  8 mm grade 1  anterolisthesis of L4 on L5. No change in alignment on flexion or extension. Mild L4-L5 disc space narrowing and spurring. Mild multilevel facet hypertrophy. No evidence of pars defects or focal bone abnormalities right upper quadrant surgical clips again seen.   IMPRESSION: 1. Grade 1 anterolisthesis of L4 on L5. No change in alignment on flexion or extension. 2. Mild L4-L5 degenerative disc disease. Mild multilevel facet hypertrophy.     Electronically Signed   By: Andrea Gasman M.D.   On: 07/25/2024 16:57EXAM: PELVIS - 1-2 VIEW   COMPARISON:  None Available.   FINDINGS: The hip joint spaces are preserved. Both femoral heads are well seated in the respective acetabula. No fracture. No evidence of erosion or avascular necrosis. The pubic symphysis and sacroiliac joints are congruent. Left pelvic phleboliths.   IMPRESSION: Negative radiograph of the pelvis.     Electronically Signed   By: Andrea Gasman M.D.   On: 07/25/2024 16:58  PATIENT SURVEYS:  Modified Oswestry:  MODIFIED OSWESTRY DISABILITY SCALE    08/15/24 09/12/24  Pain intensity 4 =  Pain medication provides me with little relief from pain. 2  2. Personal care (washing, dressing, etc.) 2 =  It is painful to take care of myself, and I am slow and careful. 0  3. Lifting 1 = I can lift heavy weights, but it causes increased pain. 1  4. Walking 2 =  Pain prevents me from walking more than  mile. 1  5. Sitting 1 =  I can only sit in my favorite chair as long as I like. 1  6. Standing 3 =  Pain prevents me from standing more than 1/2 hour. 2  7. Sleeping 2 =  Even when I take pain medication, I sleep less than 6 hours 1  8. Social Life 3 =  Pain prevents me from going out very often. 0  9. Traveling 3 = My pain restricts my travel over 1 hour 1  10. Employment/ Homemaking 1 = My normal homemaking/job activities increase my pain, but I can still perform Dawson that is required of me 1  Total 22/50 10/50    Interpretation of scores: Score Category Description  0-20% Minimal Disability The patient can cope with most living activities. Usually no treatment is indicated apart from advice on lifting, sitting and exercise  21-40% Moderate Disability The patient experiences more pain and difficulty with sitting, lifting and standing. Travel and social life are more difficult and they may be disabled from work. Personal care, sexual activity and sleeping are not grossly affected, and the patient can usually be managed by conservative means  41-60% Severe Disability Pain remains the main problem in this group, but activities of daily living are affected. These patients require a detailed investigation  61-80% Crippled Back pain impinges on Dawson aspects of the patients life. Positive intervention is required  81-100% Bed-bound These patients are either bed-bound or exaggerating their symptoms  Bluford FORBES Zoe DELENA Karon DELENA, et al. Surgery versus conservative management of stable thoracolumbar fracture: the PRESTO feasibility RCT. [place] ([place]): [employer]; 2021 Nov. [facility] Technology Assessment, No. 25.62.) Appendix 3, Oswestry Disability Index category descriptors. Available from: [email or web address].cz  Minimally Clinically Important Difference (MCID) = 12.8%  SCREENING FOR RED FLAGS: Bowel or bladder incontinence: No Spinal tumors: No Cauda equina syndrome: No Compression fracture: No Abdominal aneurysm: No  COGNITION:  Overall cognitive status: Within functional limits for tasks assessed    SENSATION: [facility]  POSTURE:  increased lumbar lordosis  LLE is longer in supine at the malleolar level  PALPATION: TTP over B lumbar paraspinals and L SI joint;   P/A lumbar vertebral glides are hypermobile feeling  LUMBAR ROM:   Active  Eval 09/16/24 10/11/24  Flexion Just below knees; no pain ~2 inch from floor To toes; no pain  Extension 100% ROM;  end range  reproduces her sharp/brief intense pain full 100% no pain  Right lateral flexion Below knee full 100% no pain  Left lateral flexion Below knee full 100% no pain  Right rotation 100% full 100% no pain  Left rotation 100% full 100% no pain  (Blank rows = not tested)  MUSCLE LENGTH: Hamstrings: Right SLR = 85 deg; Left SLR = 90+ deg Thomas test: Right  deg; Left  deg Hamstrings: not tight ITB: not tight Piriformis: moderately tight L and less so R Hip flexors: not tight Quads: not tight Heelcord: NT  LOWER EXTREMITY ROM:     Active  Right eval Left eval  Hip flexion    Hip extension    Hip abduction    Hip adduction    Hip internal rotation    Hip external rotation    Knee flexion    Knee extension    Ankle dorsiflexion    Ankle plantarflexion    Ankle inversion    Ankle eversion    (Blank rows = not tested)  LOWER EXTREMITY MMT:    MMT R 09/19/24 L 09/19/24 R/L  10/11/24  Hip flexion 4+ 4+ 5/4  Hip extension 4 4+ 4+/4+  Hip abduction 4- 4 4+/4  Hip adduction 4 4   Hip internal rotation 5 4+ 5/5  Hip external rotation 4 4 5/5  Knee flexion 5 5 5/5  Knee extension 5 5 5/4+  Ankle dorsiflexion 5 5 5/5  Ankle plantarflexion 3 3   Ankle inversion     Ankle eversion      (Blank rows = not tested)  LUMBAR SPECIAL TESTS:  Straight leg raise test: Negative, Slump test: Negative, SI Compression/distraction test: Negative, FABER test: Positive, and Gaenslen's test: Positive on the LLE Long sit test is long to short on the RLE indicating an anteriorly rotated inominate on the RLE vs. Posterior on LLE  FUNCTIONAL TESTS:  TBD  GAIT: Distance walked: into clinic Assistive device utilized: None Level of assistance: Complete Independence Gait pattern: supinates on the R, pronates on the LLE;  sway backed posture Comments:    TODAY'S TREATMENT:  10/11/24 THERAPEUTIC EXERCISE: To improve strength and endurance.  Demonstration, verbal and tactile cues throughout for  technique. Nustep L5x7min UE/LE  NEUROMUSCULAR RE-EDUCATION: To improve posture and proprioception. Standing shoulder rowing GTB w/ PPT back to wall x 2/10 BUE Standing shoulder extension GTB w/ PPT at joymor x 2/10 BUE Standing palloff press w/ PPT back to door GTB x 2/10 from each direction Supine marching w/ PPT x 2/10  THERAPEUTIC ACTIVITIES: To improve functional performance.  Demonstration, verbal and tactile cues throughout for technique. Romanian deadlift 15# x 10 BLE Standing deadlift 15# x 10 BLE Standing kettle bell  swings 15# x 2/10 Curl up and reach x 20  Curl up and reach diagonal x 10 each way Recheked strength Rechecked ROM  10/04/24 Nustep L5x6min UE/LE  Standing PPT + GTB B scap retraction + shoulder rows 2 x 10 Standing PPT + GTB B scap retraction + shoulder extension 2 x 10 Standing pallof press GTB 2x10 B Deadlift 10lb  2x10 Kettle bell swing 15lb 2x10  PATIENT EDUCATION:  Education details: HEP review and HEP update  Person educated: Patient Education method: Explanation, Verbal cues, Tactile cues, Handouts, and MedBridgeGO app access provided Education comprehension: verbalized understanding, verbal cues required, tactile cues required, and needs further education  HOME EXERCISE PROGRAM: Access Code: [email or web address] URL: [email or web address] Date: 10/11/2024 Prepared by: Garnette Montclair  Exercises - Curl Up with Reach  - 1 x daily - 7 x weekly - 3 sets - 10 reps - Diagonal Curl Up with Reach  - 1 x daily - 7 x weekly - 3 sets - 10 reps - Anti-Rotation Sidestepping with Resistance  - 1 x daily - 7 x weekly - 3 sets - 10 reps - Right Standing Lateral Shift Correction at Wall - Repetitions  - 1 x daily - 7 x weekly - 1 sets - 10 reps - 2 -3 sec hold - Hip Flexor Stretch at Edge of Bed  - 1 x daily - 7 x weekly - 1 sets - 2 reps - 1 min hold - Kettlebell Swing  - 1 x daily - 7 x weekly - 3 sets - 10 reps - Kettlebell Deadlift  - 1 x daily - 7  x weekly - 3 sets - 10 reps - Standing Shoulder Row with Anchored Resistance  - 1 x daily - 7 x weekly - 3 sets - 10 reps - Shoulder extension with resistance - Neutral  - 1 x daily - 7 x weekly - 3 sets - 10 reps - Standing Anti-Rotation Press with Anchored Resistance  - 1 x daily - 7 x weekly - 3 sets - 10 reps ASSESSMENT:  CLINICAL IMPRESSION: RECERTIFICATION NOTE:  Patient has been seen x 9 PT visits over the last 2 months for LBP w/ L sciatica.   She has made good improvement and has actually been painfree for the last few days, but she has not fully attained the goals that we have set for her.  However, she has good rehab potential to meet Dawson of her goals.   I would like to see her be painfree for longer than a few days before I would say she is recovered.   she has been off from work for the last 2 weeks for the holidays which is likely contributing to her feeling so much better.   I would like to see how she does after returning to work for a while.  Her lumbar ROM has improved to 100% Dawson planes without pain today.   She has also improved in strength in BLE, but there are still strength deficits.     She continues to demonstrate accentuated lordosis posture, and needs further strengthening to address this deficit.  She has made good overall progress and would continue to benefit from further PT for residual postural weakness.  PT remains necessary for gross strength, functional strength with lifting/ADL tasks, and full HEP progression.   Continue per the current plan of care.  Orders will be sent to MD for signature to continue PT  EVAL: Angel Dawson is a 43 y.o. female who was referred to physical therapy for evaluation and treatment for acute on chronic LBP w/ sciatica.   Patient reports onset of LBP pain beginning 2-3 weeks ago for no apparent reason. She has some chronic L low back pain with sciatica into the LLE, but reports this is normally just soreness/achiness.    The pain she is  getting now is sudden,  sharp, severe and only last 5-10 seconds, but it nearly brings her to her knees.    Pain is worse with with standing activities.   She does not normally get the pain with sitting or lying down.  Her posture is significantly accentuated lumbar lordosis and she reproduces the sharp pain with end range lumbar extension.   She also has piriformis tightness, hip weakness, and tenderness to palpate. Her LLE is a little longer than the RLE.   She is generally hyperflexible and hypermobile.   MRI shows some spondylolisthesis of L4 on L5 with DDD changes.    Patient has deficits in  hip flexibility, BLE strength, abnormal posture, and TTP with pain in the low back and hips which are interfering with ADLs and are impacting quality of life.  On Modified Oswestry patient scored 22/50 demonstrating 44% or moderate disability.  [place] will benefit from skilled PT to address above deficits to improve mobility and activity tolerance with decreased pain interference.     OBJECTIVE IMPAIRMENTS: difficulty walking, decreased strength, postural dysfunction, and pain.   ACTIVITY LIMITATIONS: carrying, standing, stairs, and locomotion level  PARTICIPATION LIMITATIONS: meal prep, cleaning, laundry, shopping, and community activity  PERSONAL FACTORS: Fitness, Time since onset of injury/illness/exacerbation, and 1-2 comorbidities: NIDDM 2, HTN, h/o pancreatitis, palpitations are also affecting patient's functional outcome.   REHAB POTENTIAL: Good  CLINICAL DECISION MAKING: Evolving/moderate complexity  EVALUATION COMPLEXITY: Moderate   GOALS: Goals reviewed with patient? Yes  SHORT TERM GOALS: Target date: 11/09/2024  Patient will be independent with ongoing/advanced HEP for self-management at home.  Baseline: no advanced HEP yet 08/29/24:  medbridge updated Goal status: IN PROGRESS- met for current 09/16/24  LONG TERM GOALS: Target date: 12/07/2024   2.  Patient will report 50-75%  improvement in low back pain to improve QOL.  Baseline: 10/10 worst 09/05/24:  4/10 today 09/12/24:  2/10 10/11/24:  0/10 pain today Goal status: MET for 10/11/24, but she needs to maintain painfree for at least 1-2 weeks before I would consider goal met  3.  Patient to demonstrate ability to achieve and maintain good spinal alignment/posturing and body mechanics needed for daily activities. Baseline: significant lumbar lordosis 09/12/24:  progressing with posterior pelvic tilt in standing  10/11/24:  improved, and correctable but she still reverts preferentially to hyperlordosis posture at times Goal status: IN PROGRESS  4.  Patient will demonstrate full pain free lumbar ROM to perform ADLs.   Baseline: Refer to above lumbar ROM table 10/11/24:  see tables above Goal status: MET -   5.  Patient will demonstrate improved BLE strength to >/= 5/5 for improved stability and ease of mobility. Baseline: Refer to above LE MMT table 10/11/24:  see updated tables above Goal status: IN PROGRESS -   6. Patient will report </= 10% on Modified Oswestry (MCID = 12%) to demonstrate improved functional ability with decreased pain interference. Baseline: 44% 09/12/24:  20% 10/11/24:  needs to complete this on next visit due to updated goal Goal status: IN PROGRESS   PLAN:  PT FREQUENCY: 1-2x/week  PT DURATION: 8 weeks  PLANNED INTERVENTIONS: 97164- PT Re-evaluation, 97110-Therapeutic exercises, 97530- Therapeutic activity, 97112- Neuromuscular re-education, 97535- Self Care, 02859- Manual therapy, G0283- Electrical stimulation (unattended), 97016- Vasopneumatic device, [id number]- Ultrasound, [id number]- Traction (mechanical), [id number]- Ionotophoresis 4mg /ml Dexamethasone , 79439 (1-2 muscles), 20561 (3+ muscles)- Dry Needling, Patient/Family education, Taping, Joint mobilization, Spinal mobilization, Cryotherapy, and Moist heat  PLAN FOR NEXT SESSION:  reassess pain, continue to progress strengthening with  functional activities with posterior pelvic tilt stabilization exercise;  modalities PRN if pain recurs  Deddrick Saindon, PT 10/11/2024, 9:53 PM  "

## 2024-10-15 ENCOUNTER — Encounter: Payer: Self-pay | Admitting: Family Medicine

## 2024-10-15 DIAGNOSIS — H93A1 Pulsatile tinnitus, right ear: Secondary | ICD-10-CM

## 2024-11-03 ENCOUNTER — Telehealth: Payer: Self-pay | Admitting: Adult Health

## 2024-11-03 NOTE — Telephone Encounter (Signed)
 LVM and my chart  I had called to let you know that I had misunderstood Duwaine when she asked me to move your Virtual Visit to Friday March 20th. She would like them to be a in person visit that day. Please let me know if you can do an in person visit. If you need to keep the virtual we will need to reschedule you. Sorry for the misunderstanding.

## 2024-11-09 ENCOUNTER — Encounter (INDEPENDENT_AMBULATORY_CARE_PROVIDER_SITE_OTHER): Payer: Self-pay

## 2024-11-09 ENCOUNTER — Encounter (INDEPENDENT_AMBULATORY_CARE_PROVIDER_SITE_OTHER): Payer: Self-pay | Admitting: Otolaryngology

## 2024-11-09 ENCOUNTER — Ambulatory Visit (INDEPENDENT_AMBULATORY_CARE_PROVIDER_SITE_OTHER): Admitting: Otolaryngology

## 2024-11-09 VITALS — BP 134/84 | HR 92 | Temp 98.3°F | Ht 71.0 in | Wt >= 6400 oz

## 2024-11-09 DIAGNOSIS — H93A1 Pulsatile tinnitus, right ear: Secondary | ICD-10-CM

## 2024-11-09 NOTE — Progress Notes (Signed)
 Reason for Consult:tinnitis Referring Physician: Dr. Watt Dawson Angel Dawson is an 44 y.o. female.  HPI: She is here for problem with pulsatile tinnitus in her right ear.  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  It used to be intermittent.  Now for the last roughly a year it has been constant.  She can stop the pulsation sound with just a gentle pressure on her right neck.  She has no pain.  No hearing loss.  No vertigo.  She has no dysphagia or odynophagia.  She has not had any trauma that is correlated to the increase in symptoms.  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  Past Medical History:  Diagnosis Date   Abdominal pain    related to gallstones   Achilles tendonitis, bilateral    Back pain    Bronchitis    02/17   Chest pain    Constipation    Diabetes (HCC)    Type II   DKA (diabetic ketoacidoses) 08/25/2014   Gallstones    GERD (gastroesophageal reflux disease)    Headache    migraines   Hypertension    Joint pain    Lattice degeneration, both eyes    Palpitations    Pancreatitis    PCOS (polycystic ovarian syndrome)    Sinus congestion    occ. uses OTC meds for this   Sleep apnea    USES CPAP   Vitamin D  deficiency     Past Surgical History:  Procedure Laterality Date   CHOLECYSTECTOMY N/A 06/22/2013   Procedure: LAPAROSCOPIC CHOLECYSTECTOMY ;  Surgeon: Angel Dawson Angel Pacini, MD;  Location: [facility];  Service: General;  Laterality: N/A;   COLONOSCOPY N/A 09/01/2024   Procedure: COLONOSCOPY;  Surgeon: Angel Elspeth SQUIBB, MD;  Location: [facility];  Service: Gastroenterology;  Laterality: N/A;   DILITATION & CURRETTAGE/HYSTROSCOPY WITH HYDROTHERMAL ABLATION N/A 01/03/2016   Procedure: DILATATION & CURETTAGE/HYSTEROSCOPY WITH HYDROTHERMAL ABLATION;  Surgeon: Angel All, MD;  Location: [facility];  Service: Gynecology;  Laterality: N/A;  HTA rep will be attend confirmed by office 12/12/15    NASAL SEPTOPLASTY  W/ TURBINOPLASTY Bilateral 01/11/2021   Procedure: NASAL SEPTOPLASTY WITH TURBINATE REDUCTION;  Surgeon: Angel Lenis, MD;  Location: [facility] OR;  Service: ENT;  Laterality: Bilateral;   TONSILLECTOMY      Family History  Problem Relation Age of Onset   Sleep apnea Mother    Thyroid  disease Mother    Hypertension Mother    Diabetes Mother    Goiter Mother    Anxiety disorder Mother    Obesity Mother    Anxiety disorder Father    Hypertension Father    Diabetes Father    Asthma Father    Bipolar disorder Father    Depression Father    Diabetes Maternal Aunt    Lung cancer Paternal Grandmother        smoker-heavy   Stroke Paternal Grandfather    Colon cancer Neg Hx    Heart disease Neg Hx    Esophageal cancer Neg Hx    Rectal cancer Neg Hx    Stomach cancer Neg Hx     Social History:  reports that she has never smoked. She has never used smokeless tobacco. She reports that she does not currently use alcohol. She reports that she does not use drugs.  Allergies: Allergies[1]   No results found for this or any previous visit (  from the past 48 hours).  No results found.  ROS Blood pressure 134/84, pulse 92, temperature 98.3 F (36.8 C), height 5' 11 (1.803 m), weight (!) 402 lb (182.3 kg), SpO2 96%. Physical Exam Constitutional:      Appearance: Normal appearance.  HENT:     Head: Normocephalic and atraumatic.     Right Ear: Tympanic membrane is without lesions and middle ear aerated, ear canal and external ear normal.     Left Ear: Tympanic membrane is without lesions and middle ear aerated, ear canal and external ear normal.     Nose: Nose without deviation of septum.  Turbinates with mild hypertrophy, No significant swelling or masses.     Oral cavity/oropharynx: Mucous membranes are moist. No lesions or masses    Larynx: normal voice. Mirror attempted without success    Eyes:     Extraocular Movements: Extraocular movements intact.     Conjunctiva/sclera:  Conjunctivae normal.     Pupils: Pupils are equal, round, and reactive to light.  Cardiovascular:     Rate and Rhythm: Normal rate.  Pulmonary:     Effort: Pulmonary effort is normal.  Musculoskeletal:     Cervical back: Normal range of motion and neck supple. No rigidity.  Lymphadenopathy:     Cervical: I cannot auscultate any bruit.  The skull, around the ear, neck, and chest were Dawson auscultated.  The sound subjectively stops when the stethoscope is gently placed on her neck.  No cervical adenopathy or masses.salivary glands without lesions. .     Salivary glands- no mass or swelling Neurological:     Mental Status: He is alert. CN 2-12 intact. 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  I think referring to an otologist to see if there is something they can do about the dehiscence of the vein would be the first choice.  I will refer her to [facility] for one of the otology trained doctors.  Angel Dawson 11/09/2024, 10:38 AM        [1] No Known Allergies

## 2024-11-15 ENCOUNTER — Encounter (INDEPENDENT_AMBULATORY_CARE_PROVIDER_SITE_OTHER): Payer: Self-pay

## 2024-12-27 ENCOUNTER — Telehealth: Admitting: Adult Health

## 2024-12-30 ENCOUNTER — Ambulatory Visit: Admitting: Adult Health

## 2025-03-13 ENCOUNTER — Ambulatory Visit: Admitting: Family Medicine
